# Patient Record
Sex: Male | Born: 1940 | Race: White | Hispanic: No | Smoking: Former smoker
Health system: Southern US, Community
[De-identification: ages and names within clinical notes are randomized; demographics above are authoritative.]

## PROBLEM LIST (undated history)

## (undated) DIAGNOSIS — C801 Malignant (primary) neoplasm, unspecified: Secondary | ICD-10-CM

## (undated) DIAGNOSIS — Z8719 Personal history of other diseases of the digestive system: Secondary | ICD-10-CM

## (undated) DIAGNOSIS — Z87442 Personal history of urinary calculi: Secondary | ICD-10-CM

## (undated) DIAGNOSIS — R519 Headache, unspecified: Secondary | ICD-10-CM

## (undated) DIAGNOSIS — E119 Type 2 diabetes mellitus without complications: Secondary | ICD-10-CM

## (undated) DIAGNOSIS — E785 Hyperlipidemia, unspecified: Secondary | ICD-10-CM

## (undated) DIAGNOSIS — I499 Cardiac arrhythmia, unspecified: Secondary | ICD-10-CM

## (undated) DIAGNOSIS — R51 Headache: Secondary | ICD-10-CM

## (undated) DIAGNOSIS — H269 Unspecified cataract: Secondary | ICD-10-CM

## (undated) DIAGNOSIS — K219 Gastro-esophageal reflux disease without esophagitis: Secondary | ICD-10-CM

## (undated) DIAGNOSIS — I10 Essential (primary) hypertension: Secondary | ICD-10-CM

## (undated) DIAGNOSIS — M1990 Unspecified osteoarthritis, unspecified site: Secondary | ICD-10-CM

## (undated) DIAGNOSIS — R112 Nausea with vomiting, unspecified: Secondary | ICD-10-CM

## (undated) DIAGNOSIS — I4891 Unspecified atrial fibrillation: Secondary | ICD-10-CM

## (undated) DIAGNOSIS — Z9889 Other specified postprocedural states: Secondary | ICD-10-CM

## (undated) HISTORY — DX: Type 2 diabetes mellitus without complications: E11.9

## (undated) HISTORY — PX: SHOULDER SURGERY: SHX246

## (undated) HISTORY — PX: FOOT SURGERY: SHX648

## (undated) HISTORY — PX: CHOLECYSTECTOMY: SHX55

## (undated) HISTORY — PX: OTHER SURGICAL HISTORY: SHX169

## (undated) HISTORY — PX: COLONOSCOPY W/ POLYPECTOMY: SHX1380

## (undated) HISTORY — PX: WISDOM TOOTH EXTRACTION: SHX21

## (undated) HISTORY — PX: HERNIA REPAIR: SHX51

## (undated) HISTORY — PX: CARDIAC CATHETERIZATION: SHX172

## (undated) HISTORY — DX: Unspecified cataract: H26.9

## (undated) HISTORY — PX: BACK SURGERY: SHX140

## (undated) HISTORY — PX: EYE SURGERY: SHX253

---

## 2000-02-21 ENCOUNTER — Ambulatory Visit (HOSPITAL_COMMUNITY): Admission: RE | Admit: 2000-02-21 | Discharge: 2000-02-21 | Payer: Self-pay | Admitting: General Surgery

## 2004-01-21 ENCOUNTER — Inpatient Hospital Stay (HOSPITAL_COMMUNITY): Admission: EM | Admit: 2004-01-21 | Discharge: 2004-01-22 | Payer: Self-pay | Admitting: Emergency Medicine

## 2004-01-26 ENCOUNTER — Inpatient Hospital Stay (HOSPITAL_COMMUNITY): Admission: EM | Admit: 2004-01-26 | Discharge: 2004-01-29 | Payer: Self-pay | Admitting: *Deleted

## 2004-02-26 ENCOUNTER — Encounter: Admission: RE | Admit: 2004-02-26 | Discharge: 2004-02-26 | Payer: Self-pay | Admitting: Gastroenterology

## 2004-02-28 ENCOUNTER — Ambulatory Visit (HOSPITAL_COMMUNITY): Admission: RE | Admit: 2004-02-28 | Discharge: 2004-02-28 | Payer: Self-pay | Admitting: Gastroenterology

## 2004-04-22 ENCOUNTER — Ambulatory Visit (HOSPITAL_COMMUNITY): Admission: RE | Admit: 2004-04-22 | Discharge: 2004-04-23 | Payer: Self-pay | Admitting: Interventional Cardiology

## 2006-05-11 ENCOUNTER — Observation Stay (HOSPITAL_COMMUNITY): Admission: EM | Admit: 2006-05-11 | Discharge: 2006-05-13 | Payer: Self-pay | Admitting: Emergency Medicine

## 2006-07-27 ENCOUNTER — Encounter: Admission: RE | Admit: 2006-07-27 | Discharge: 2006-07-27 | Payer: Self-pay | Admitting: Internal Medicine

## 2007-05-02 ENCOUNTER — Inpatient Hospital Stay (HOSPITAL_COMMUNITY): Admission: EM | Admit: 2007-05-02 | Discharge: 2007-05-04 | Payer: Self-pay | Admitting: Emergency Medicine

## 2007-05-02 ENCOUNTER — Ambulatory Visit: Payer: Self-pay | Admitting: *Deleted

## 2007-05-31 ENCOUNTER — Observation Stay (HOSPITAL_COMMUNITY): Admission: EM | Admit: 2007-05-31 | Discharge: 2007-06-02 | Payer: Self-pay | Admitting: Emergency Medicine

## 2010-01-05 ENCOUNTER — Emergency Department (HOSPITAL_COMMUNITY): Admission: EM | Admit: 2010-01-05 | Discharge: 2010-01-05 | Payer: Self-pay | Admitting: Emergency Medicine

## 2010-04-23 LAB — CBC
HCT: 47.6 % (ref 39.0–52.0)
Hemoglobin: 16.9 g/dL (ref 13.0–17.0)
MCH: 31.2 pg (ref 26.0–34.0)
MCHC: 35.5 g/dL (ref 30.0–36.0)
MCV: 88 fL (ref 78.0–100.0)
Platelets: 211 10*3/uL (ref 150–400)
RBC: 5.41 MIL/uL (ref 4.22–5.81)
RDW: 12.7 % (ref 11.5–15.5)
WBC: 6.3 10*3/uL (ref 4.0–10.5)

## 2010-04-23 LAB — TROPONIN I: Troponin I: 0.01 ng/mL (ref 0.00–0.06)

## 2010-04-23 LAB — DIFFERENTIAL
Basophils Absolute: 0 10*3/uL (ref 0.0–0.1)
Basophils Relative: 0 % (ref 0–1)
Eosinophils Absolute: 0.1 10*3/uL (ref 0.0–0.7)
Eosinophils Relative: 1 % (ref 0–5)
Lymphocytes Relative: 28 % (ref 12–46)
Lymphs Abs: 1.8 10*3/uL (ref 0.7–4.0)
Monocytes Absolute: 0.6 10*3/uL (ref 0.1–1.0)
Monocytes Relative: 10 % (ref 3–12)
Neutro Abs: 3.8 10*3/uL (ref 1.7–7.7)
Neutrophils Relative %: 61 % (ref 43–77)

## 2010-04-23 LAB — COMPREHENSIVE METABOLIC PANEL
ALT: 50 U/L (ref 0–53)
AST: 41 U/L — ABNORMAL HIGH (ref 0–37)
Albumin: 4.1 g/dL (ref 3.5–5.2)
Alkaline Phosphatase: 73 U/L (ref 39–117)
BUN: 15 mg/dL (ref 6–23)
CO2: 23 mEq/L (ref 19–32)
Calcium: 9.1 mg/dL (ref 8.4–10.5)
Chloride: 106 mEq/L (ref 96–112)
Creatinine, Ser: 0.92 mg/dL (ref 0.4–1.5)
GFR calc Af Amer: >60 mL/min (ref >60)
GFR calc non Af Amer: >60 mL/min (ref >60)
Glucose, Bld: 86 mg/dL (ref 70–99)
Potassium: 3.7 mEq/L (ref 3.5–5.1)
Sodium: 136 mEq/L (ref 135–145)
Total Bilirubin: 0.9 mg/dL (ref 0.3–1.2)
Total Protein: 7.3 g/dL (ref 6.0–8.3)

## 2010-04-23 LAB — CK TOTAL AND CKMB (NOT AT ARMC)
CK, MB: 1.7 ng/mL (ref 0.3–4.0)
Relative Index: INVALID (ref 0.0–2.5)
Total CK: 79 U/L (ref 7–232)

## 2010-06-25 NOTE — H&P (Signed)
NAMEJMARI, PELC              ACCOUNT NO.:  [id number]   MEDICAL RECORD NO.:  [id number]          PATIENT TYPE:  INP   LOCATION:  4709                         FACILITY:  [facility]   PHYSICIAN:  Lyn Records, M.D.   DATE OF BIRTH:  [birth year]   DATE OF ADMISSION:  05/02/2007  DATE OF DISCHARGE:                              HISTORY & PHYSICAL   CHIEF COMPLAINT:  Chest pain.   HISTORY OF PRESENT ILLNESS:  Mr. Beirne is a 70 year old, white male  with past medical history notable for medically managed CAD who presents  to the  emergency department for evaluation of chest pain.  The patient  states the symptoms began this morning while at church at approximately  9 a.m..  He had the onset of 10/10 chest pain which was substernal in  location and radiated to his throat and teeth.  He states that his  symptoms were very similar to his prior episodes of angina.  He also  notes that he had been having similar symptoms throughout the preceding  week, although, they were much milder.  Initial intense episode of chest  pain lasted approximately 2-3 minutes and then spontaneously improved.  He took a nitroglycerin approximately 1 p.m. with some relief, however,  the pain never completely went away.  He does endorse associated  shortness of breath, nausea and presyncopal symptoms.  He denies  orthopnea, diaphoresis, palpitations.  The patient has had a long  history of chronic angina, although he denies any exertional symptoms  and it seems to be somewhat random in onset.  Of note, the patient has  had two cardiac catheterizations which have revealed an obstructive  lesion in his diagonal, which is not amenable to percutaneous  intervention.  His stress test was reported in April 2008, which  revealed no evidence of inducible ischemia and probable diaphragmatic  attenuation.  In the ED, his EKG revealed normal sinus rhythm with no  evidence of injury or ischemia.  His cardiac biomarkers by  point of care  testing were negative x1.  Otherwise, he is without complaints.   PAST MEDICAL HISTORY:  1. CAD, medically managed.  Nuclear stress test in April 2008,      negative for inducible ischemia.  2. GERD.  3. Hyperlipidemia.   ALLERGIES:  NO KNOWN DRUG ALLERGIES.   MEDICATIONS:  1. Aspirin 81 mg daily.  2. Zocor 40 mg daily.   SOCIAL HISTORY:  The patient lives [place] with his wife.  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  He quit  smoking approximately 30 years ago.  He has an occasional beer, but  denies any illicit substances.   FAMILY HISTORY:  His mother died of bladder CA at the age of 72.  His  father died of natural causes at the age of 50.  He has a brother who is  status post CABG at the age of 29.  He has another brother who is status  post PCI.   REVIEW OF SYSTEMS:  As per HPI, otherwise complete review of systems was  obtained and is negative.   PHYSICAL EXAMINATION:  VITAL SIGNS:  Blood pressure is 115/73, heart  rate 69, O2 saturations 97% on 2 L nasal cannula.  Temperature is 97.7.  GENERAL:  The patient is alert and oriented x3 in no acute stress.  HEENT:  Normocephalic, atraumatic, EOMI, PERL, nares patent.  OP is  clear without erythema or exudate.  NECK:  Supple, full range of motion, no appreciable JVD.  Carotid  upstrokes are equal and symmetric bilaterally with no audible bruits.  There is no palpable thyromegaly or lymphadenopathy.  CARDIOVASCULAR:  Normal S1 and S2 with no audible murmurs, rubs or  gallops.  PMI is normal in the left midclavicular line.  Peripheral  pulses are 2+ and symmetric bilaterally.  LUNGS:  Clear to auscultation bilaterally.  SKIN:  No rashes or lesions.  ABDOMEN:  Soft, nontender, nondistended.  Positive bowel sounds.  No  hepatosplenomegaly.  GENITALIA:  Normal male genitalia.  EXTREMITIES:  No clubbing, cyanosis or edema.  MUSCULOSKELETAL:  No joint deformity or effusions.  There  is mild  palpable tenderness over the sternum.  NEUROLOGIC:  Grossly nonfocal.   LABORATORY DATA AND X-RAY FINDINGS:  EKG with normal sinus rhythm with  no evidence of injury or ischemia.   Sodium 138, potassium 3.9, chloride 108, CO2 is 22, BUN 17, creatinine  0.9, glucose 86.  White count 6.5, hematocrit 45.6, platelet count 189.  CK-MB point of care 1.2.  Troponin I point of care less than 0.05.   IMPRESSION:  1. Chest pain, rule out myocardial infarction.  2. Coronary artery disease.  3. Hyperlipidemia.  4. Gastroesophageal reflux disease.   PLAN:  Plan to admit the patient to a telemetry bed for further  evaluation and monitoring.  Will cycle his cardiac biomarkers to rule  out myocardial infarction.  The patient has a long history of chronic  angina and his symptoms are consistent with prior episodes.  Will  initiate Lovenox 1 mg/kg q.12 h.  Continue aspirin 325 mg once daily.  Initiate metoprolol 25 mg p.o. b.i.d.  Check fasting lipid profile and  continue simvastatin 40 mg daily.  Will make the patient n.p.o. after  midnight in anticipation of further testing in the morning.      Audery Amel, MD   Electronically Signed     ______________________________  Lyn Records, M.D.    Maylon Cos  D:  05/02/2007  T:  05/03/2007  Job:  [id number]

## 2010-06-25 NOTE — Discharge Summary (Signed)
[name], [name]              ACCOUNT NO.:  [id number]   MEDICAL RECORD NO.:  [id number]          PATIENT TYPE:  OBV   LOCATION:  6527                         FACILITY:  [facility]   PHYSICIAN:  Guy Franco, P.A.       DATE OF BIRTH:  [birth year]   DATE OF ADMISSION:  05/31/2007  DATE OF DISCHARGE:  06/02/2007                               DISCHARGE SUMMARY   DISCHARGE DIAGNOSES:  1. Chest pain, negative cardiac enzymes.  2. Known coronary artery disease, nonobstructive.  3. Gastroesophageal reflux disease.  4. Hyperlipidemia.  5. Hypertension.   HOSPITAL COURSE:  Mr. [name] is a 70 year old male patient with  known history of coronary artery disease, nonobstructive.  He was seen  in the office assisted with catheterization with hypertension and chest  pain.  He was started on Norvasc and also started on Nexium for presumed  reflux disease.  Cardiac catheterization within the past 3 weeks showed  a right coronary artery 30%, LAD less than 50%, circumflex less than  50%, small diagonal and subtotal.  The left main appeared initially to  have greater than 50% stenosis with VUS demonstrated patency.   He was admitted on May 31, 2007, with substernal chest pain once  again.  His labs are normal during his hospitalization and cardiac  enzymes were normal.  It was felt that his discomfort was probably  gastric in nature.  Dr. Carman Ching of the [facility] saw the patient and agreed that a b.i.d. PPI would help.  The  patient has had a workup at [facility] in 2006 which included  an EGD, 24-hour pH monitoring, manometry and Bernstein test, which were  all normal.   At this point, he was ready for discharge to home.  Because he does have  a known coronary artery disease, Dr. Katrinka Blazing felt Ranexa could be  beneficial to the patient and we will have him on this until his  followup in the office.  I am giving him samples to utilize until his  office  visit.  If he is better, he will need to have a prescription for  this.   DISCHARGE MEDICATIONS:  1. Ranexa 500 mg 1 p.o. b.i.d.  2. Enteric coated aspirin 325 mg daily.  3. Metoprolol as prior to admission.  4. Zocor 40 mg daily.  5. Norvasc 5 mg daily.  6. Nexium 40 mg p.o. b.i.d.   DIET:  The patient is to remain on low-sodium, heart-healthy diet.   ACTIVITY:  Increase activity slowly.   FOLLOWUP:  Followup with Laurice Record nurse practitioner or with Dr. Katrinka Blazing  on Jun 16, 2007, at 11 a.m.      Guy Franco, P.A.     LB/MEDQ  D:  06/02/2007  T:  06/02/2007  Job:  [id number]   cc:   Lyn Records, M.D.  James L. Malon Kindle., M.D.

## 2010-06-25 NOTE — Cardiovascular Report (Signed)
[name], [name]              ACCOUNT NO.:  [id number]   MEDICAL RECORD NO.:  [id number]          PATIENT TYPE:  INP   LOCATION:  4709                         FACILITY:  [facility]   PHYSICIAN:  Lyn Records, M.D.   DATE OF BIRTH:  1941/01/30   DATE OF PROCEDURE:  05/04/2007  DATE OF DISCHARGE:                            CARDIAC CATHETERIZATION   INDICATION FOR STUDY:  Recurring chest pain and history of CAD.   INDICATIONS:  Recurring chest pain, history of coronary artery disease,  and total occlusion of small first diagonal, negative markers.   PROCEDURE PERFORMED:  1. Left heart catheterization.  2. Selective coronary angiography.  3. Left ventriculography.  4. Intravascular ultrasound of the left main.  5. Angio-Seal.   DESCRIPTION:  After informed consent, a 6-French sheath was placed in  the right femoral artery using the modified Seldinger technique.  A 6-  [place] A2 multipurpose catheter was then used for hemodynamic  recordings, left ventriculography by hand injection, and selective  coronary angiography.  Again noted was an unusual orientation and  probable bend in the left main that in some views seems to obstruct the  vessel by greater than 50%, other views suggested this area was widely  patent.  Therefore, giving a very eccentric nature to the region of  interest.  Because of this, patient was given 50 units per kg of  intravenous heparin.  ACT was documented to be greater than 250.  An  intravascular ultrasound was performed using a #4 left Judkins guide  catheter, a BMW guidewire, and the Atlantis intravascular ultrasound  system.  Please see results below.   Post intravascular ultrasound:  A groin shot was performed through the  sheath and Angio-Seal was performed with good hemostasis.   RESULTS:  1. Hemodynamic data:      a.     Aortic pressure 117/9.  B  Left ventricular pressure 119/11 mmHg.  1. Left ventriculography:  Left ventricular cavity size and  function      are normal.  The ejection fraction is 60%.  No regional wall motion      abnormality.  2. Coronary angiography:  A.Left main coronary:  The left main coronary artery has a  bend/angulation.  This appears somewhat similar, butin one view more  severe than on previous catheterization 3 years ago.  In that particular  view, there appears to be greater than 50% narrowing.  The vessel is  otherwise widely patent.  B.Left anterior descending coronary:  Faint calcifications noted in the  proximal and mid vessel.  The first diagonal was small, was subtotally  occluded/99%.  The LAD near the origin of the second diagonal contains  eccentric 50% narrowing.  TIMI grade II to III flow was noted in the  LAD, in general slightly less than brisk TIMI III flow.  The second  diagonal was large and patent.  C.  Circumflex artery:  There was also a bend in the proximal  circumflex.  Some views suggest equal to 50% narrowing.  During diastole  when the vessel straightens, no significant obstruction is noted.  The  first obtuse marginal was dominant, small lower second obtuse marginal  was noted.  Circumflex was widely patent.  D.  Right coronary:  The right coronary artery is large, contains mid  vessel irregularities with up to 30% narrowing.  PDA and left  ventricular branches are widely patent.  1. Intravascular ultrasound:  The left main mid segment of interest      where there is either a bend/kink or disease underwent      intravascular ultrasound that demonstrated a cross-sectional area      by planimetry of 13.44 sq mm.  The diameter was 3.8 x 4.4 mm.  The      distal reference segment luminal cross-sectional area was 19.5 sq      mm with diameter measurements of 4.88 x 4.6.  Minimal eccentric      plaque was noted in the left main.  Essentially left main is widely      patent.   IMPRESSION:  1. Moderate coronary artery disease with 99% stenosis of small first      diagonal, less  than or equal to 50% mid left anterior descending      stenosis, widely patent left anterior descending , and mild      atherosclerosis in the mid right coronary less than 50%.  2. The left main has a bend/kink but no significant obstruction is      noted.  (The threshold for significant cross-sectional area in the      left main is less than 6 sq mm).  3. Normal left ventricular function.   PLAN:  Ambulate, home later today.  Continue aggressive risk factor  modification.  Reconsider GI consultation/workup.      Lyn Records, M.D.  Electronically Signed     HWS/MEDQ  D:  05/04/2007  T:  05/04/2007  Job:  [id number]   cc:   Theressa Millard, M.D.

## 2010-06-25 NOTE — Discharge Summary (Signed)
NAMEHAFIZ, IRION              ACCOUNT NO.:  [id number]   MEDICAL RECORD NO.:  [id number]          PATIENT TYPE:  INP   LOCATION:  4709                         FACILITY:  [facility]   PHYSICIAN:  Lyn Records, M.D.   DATE OF BIRTH:  03-Dec-1940   DATE OF ADMISSION:  05/02/2007  DATE OF DISCHARGE:  05/04/2007                               DISCHARGE SUMMARY   DISCHARGE DIAGNOSIS:  1. Nonobstructive coronary artery disease with normal left ventricular      function.  2. Gastroesophageal reflux disease.  3. Hyperlipidemia  4. Long-term medication use.   HISTORY OF PRESENT ILLNESS:  Mr. Want is a 70 year old male patient  who was admitted to [facility] on May 02, 2007, with chest  pain.   Lab studies during this hospital stay include cardiac isoenzymes that  were negative.  Total cholesterol of 121, triglycerides 166, HDL 34, and  LDL 56.  Other laboratory studies were unremarkable with his hemoglobin  being 14.2, hematocrit 41.0, and platelets 186.  Sodium 140, potassium  3.8, BUN 17, and creatinine 0.89.  He ultimately underwent cardiac  catheterization, which showed moderate coronary artery disease with a  99% stenosis of the small first diagonal with less than or equal to 50%  mid LAD stenosis.  The circumflex had been in the proximal area with  some view suggesting a 50% narrowing, but during diastole when the  vessel strained, there was no significant obstruction noted.  Right  coronary artery had mild irregularities nonobstructive.  The PDA and  left ventricular branches were widely patent.  Dr. Katrinka Blazing did note that  there appeared to be a greater than 50% narrowing of the left main, and  at that point, intravascular ultrasound was utilized, and there was  minimal eccentric plaque noted within the left main.  He did feel that  essentially the left main was widely patent.   The patient was then discharged to home in stable condition.  His  condition as stated has  improved.  We are discharging him back home on  his home medication regimen which includes:  1. Enteric-coated aspirin 325 mg.  2. Zocor 40 mg a day.   He is to remain on a low-sodium heart-healthy diet.  Increase activity  slowly.  No lifting over 10 pounds for 1 week and no driving for 1 day.  Follow up with Dr. Katrinka Blazing on May 18, 2007, at 1:30 p.m.      Guy Franco, P.A.      Lyn Records, M.D.  Electronically Signed   LB/MEDQ  D:  06/01/2007  T:  06/02/2007  Job:  [id number]

## 2010-06-28 NOTE — Cardiovascular Report (Signed)
NAMEANASTASIOS, [name]              ACCOUNT NO.:  [id number]   MEDICAL RECORD NO.:  [id number]          PATIENT TYPE:  INP   LOCATION:  2905                         FACILITY:  [facility]   PHYSICIAN:  Lyn Records III, M.D.DATE OF BIRTH:  [birth year]   DATE OF PROCEDURE:  01/29/2004  DATE OF DISCHARGE:  01/29/2004                              CARDIAC CATHETERIZATION   INDICATIONS:  Recurring chest pain, borderline abnormal Cardiolite with  subtle possible anterior ischemia in this patient with few risk factors for  coronary disease.  The study is being done to rule out CAD.   PROCEDURES PERFORMED:  1.  Left heart catheterization.  2.  Selective coronary angiography.  3.  Left ventriculography.  4.  AngioSeal arteriotomy closure.  5.  Intracoronary nitroglycerin administration.   DESCRIPTION:  After informed consent a 6-French sheath was placed in the  right femoral artery using the modified Seldinger technique.  A 6-French A2  multipurpose catheter was used for hemodynamic recordings, left  ventriculography by hand injection, and selective left and right coronary  angiography.  We also used a #4 left Judkins 6-French catheter for left  coronary angiography and intracoronary nitroglycerin administration.  Patient tolerated the procedure without complications.  After sheathogram  was performed in the right femoral AngioSeal arteriotomy closure was  performed without complications.   RESULTS:   I. HEMODYNAMIC DATA:  A.  Aortic pressure 113/56.  B.  Left ventricular pressure 113/13.   II. LEFT VENTRICULOGRAPHY:  The overall LV function is normal.  EF is 65%.  Perhaps the anterobasal anterior wall is slightly hypokinetic.  No mitral  regurgitation is noted.   III. CORONARY ANGIOGRAPHY:  A.  The left main artery arises superiorly from  the left sinus and Valsalva.  There therefore is a bend in the proximal left  anterior descending that suggests the possibility of narrowing which I  do  not believe is true.  There is no more than 25% proximal LAD narrowing.  Minimal luminal irregularity is noted in the body of the left main.  B.  Left anterior descending coronary:  The LAD is large.  It contains  multiple areas of plaque throughout the proximal and mid vessel.  Regions of  plaque obstruct the artery by up to 40-50%.  The first diagonal which is  relatively small, less than 2 mm in diameter, contains a 99% stenosis and  there is TIMI grade 2 flow noted.  The second diagonal which is the largest  of the diagonal branches is free of any significant obstruction, though it  does contain luminal irregularities.  The third diagonal is more distal and  is very small.  The mid and distal LAD is free of significant obstruction  though there are areas of luminal irregularity noted.  C.  Circumflex artery:  The circumflex coronary artery is large and free of  any significant obstruction.  Circumflex gives origin to three obtuse  marginal branches.  The first obtuse marginal is the larger of the three and  is free of obstruction.  D.  Right coronary:  The right coronary artery contains multiple  luminal  irregularities throughout the proximal mid and distal vessel.  A large PDA  is noted.  Two left ventricular branches are noted.   CONCLUSION:  1.  Subtotal occlusion with 99% stenosis and TIMI grade 2 flow noted in the      small first diagonal.  This likely accounts for the patient's symptoms      and the very mild abnormality noted on the Cardiolite.  2.  Patient has perhaps 25% narrowing within a bend in the left main but no      significant obstruction is felt to be present.  There are diffuse      luminal irregularities up to 50% noted in proximal and mid left anterior      descending and luminal irregularities of less severity noted in the      right coronary artery.  The circumflex is moderate in size and free of      any significant obstruction.  3.  Overall normal left  ventricular function.   PLAN:  Medical therapy, Plavix, Statin.  Clinical follow-up in several  weeks.       HWS/MEDQ  D:  01/29/2004  T:  01/30/2004  Job:  [id number]   cc:   Theressa Millard, M.D.  [address] [place]  [place] [address]  Fax: [phone]

## 2010-06-28 NOTE — H&P (Signed)
[name], [name] NO.:  [id number]   MEDICAL RECORD NO.:  [id number]          PATIENT TYPE:  INP   LOCATION:  1824                         FACILITY:  [facility]   PHYSICIAN:  Lyn Records, M.D.   DATE OF BIRTH:  08-13-1940   DATE OF ADMISSION:  01/26/2004  DATE OF DISCHARGE:                                HISTORY & PHYSICAL   PRIMARY CARE PHYSICIAN:  Theressa Millard, M.D.   CHIEF COMPLAINT:  Recurrent chest pain.   HISTORY OF PRESENT ILLNESS:  [name] is a 70 year old gentleman who has  just been evaluated between December 11 and January 22, 2004, for episode  of chest pain.  He has known history of chest pain 10 years ago. Diagnostic  angiography done at that time revealed normal coronary arteries.  Last  hospitalized January 21, 2004, with complaints of exertional chest pain.  Subsequent nuclear medicine study was equivocal with an EF of 59%, possible  suspicious anterolateral wall ischemic at the apex with normal wall motion.  The patient was discharged to home on beta blockers and sublingual  nitroglycerin, instructed to call if he had recurrent chest pain.   The patient did call the office on January 25, 2004, with complaints of  recurrent exertional chest discomfort.  Because of this, he had already been  scheduled to undergo diagnostic angiography this Monday, December 19.  He  presented to the ER today via private vehicle after experiencing severe  substernal chest pain associated with shortness of breath, nausea, pale  coloration.  He was at work when this occurred.  Chest pain lasted greater  than 30 minutes and was severe enough to bring the patient to his knees  according to his description.  The patient described the pain as being  20/10.  He took 2 nitroglycerin while at work without much change in the  quality or severity of the pain.  He notified his wife who brought him to  the ER.  He was subsequently treated with nitroglycerin, aspirin,  and O2  here.  Chest pain came down to 4/10, and it is currently about 1 to 2/10 and  nagging in the midsternal area. When the pain was at its most severe, it  radiated into the right shoulder, down the right arm, and felt like his  bones were aching.  He is currently having a burning type of chest  discomfort in the mid epigastric region.  The patient has already been seen  by Dr. Katrinka Blazing.  EKGs have been reviewed and show no acute ischemic changes.  Initial cardiac isoenzymes are at this time within normal limits.  Subsequently the patient has been admitted for acute coronary syndrome,  started on Integrilin.  Beta blocker and statin drugs have been continued,  and plan is to proceed with diagnostic coronary angiography on Monday as  planned, sooner if patient's status decompensates over the weekend.   REVIEW OF SYSTEMS:  Essentially noncontributory at this point.  He has a  history of hiatal hernia with gastroesophageal reflux disease, but currently  this chest discomfort is quite different from problems related  to this.  He  has had no dizziness, no palpitations, although his coming to the ER and  receiving dose of beta blocker with heart rate dropping into the 40s, he  sometimes feels like his heart is fluttering, but this is the first time  this has occurred.  He has had no problems with lower extremity  claudication, no edema, and no rest chest discomfort.  Chest pain always  occurs with exertion, usually walking at a normal pace.   FAMILY MEDICAL HISTORY:  Mother:  Bladder cancer.  Father died of old age.  No history of coronary artery disease.  There is a family history of  diabetes.   SOCIAL HISTORY:  The patient has a remote history of very light tobacco use  more than 30 years ago, rare social alcohol intake.  He is married and  continues to work and drive.   PAST MEDICAL HISTORY:  1.  Chest pain 10 years ago with nonobstructive coronary artery disease per       catheterization, normal coronary arteries.  2.  Hiatal hernia.  3.  GERD.   ALLERGIES:  No known drug allergies.   CURRENT MEDICATIONS:  1.  Nitroglycerin 0.4 mg sublingual p.r.n.  2.  Aspirin 325 mg daily.   PHYSICAL EXAMINATION:  GENERAL:  Pleasant, well-developed male currently  complaining of mild burning substernal epigastric chest pain.  VITAL SIGNS:  Temperature 97, blood pressure 129/78, heart rate 52 and  regular, initial pulse 62 prior to receipt of beta blockers, respirations  17.  HEENT:  Head is normocephalic.  Sclerae not injected.  NECK:  Supple.  No adenopathy.  NEUROLOGIC:  Alert and oriented x 3, moving all extremities x 4.  No focal  neurological deficits.  Gait and sensation not assessed.  CHEST:  Bilateral lung sounds clear to auscultation posteriorly.  Respiratory effort is not labored.  The patient is currently on 2 liters  nasal cannula O2, saturating 98% in the ER.  CARDIAC:  Heart sounds are S1 and S2.  No rubs, murmurs, thrills, or  gallops.  No JVD.  Carotids 2+ bilaterally without bruits.  Sinus rhythm  without notable ectopy on initial exam.  ABDOMEN:  Soft, nontender, nondistended with no hepatosplenomegaly, masses,  or bruits.  Bowel sounds are present in all four quadrants.  EXTREMITIES:  Symmetrical in appearance without edema, cyanosis, or  clubbing.  Peripheral pulses are easily palpable at 2+ radial, femoral, and  pedal.   LABORATORY DATA AND OTHER STUDIES:  Hemoglobin 15.5, hematocrit 44.5,  platelet count 212,000.  PTT 29.  White count 5300.  Sodium 138, potassium  4.0, chloride 108, CO2 23, glucose 102, BUN 14, creatinine 0.9.  AST mildly  elevated at 44, ALT mildly elevated at 68.  Albumin is normal.  Total  bilirubin normal.  Lipase normal at 36.  Point-of-care markers x 2  collections:  Initial myoglobin 75, subsequently 75.2.  MB 2.4, subsequently  2.3.  Troponin I less than 0.05 with second marker less than 0.05.  He has also had a  cardiac panel drawn showing a CK of 91.  This is done after the  initial point-of-care.  MB 2.6 and troponin I 0.03.   Diagnostic again 12-lead EKG done upon presentation with patient complaining  of chest discomfort.  In comparison to the December 11 EKG, essentially  unchanged.  Sinus rhythm with an elevated J point versus an early  repolarization pattern in the inferolateral leads, normal R wave  progression, normal axis.  Sinus bradycardia, ventricular rate 56, QTC 411  msec.   Chest x-ray has been done in the ER and has been read by the radiologist and  shows mild aortic elongations, no evidence of active disease or interval  change.   IMPRESSION:  1.  Chest pain with recent equivocal Cardiolite study.  Acute coronary      syndrome until proven otherwise.  2.  History of gastroesophageal reflux disease, currently not on proton pump      inhibitor according to my initial assessment.   PLAN:  Again, Dr. Katrinka Blazing has seen and evaluated the patient. Will proceed as  follows.  1.  Admit patient to a telemetry bed.  2.  Start acute coronary syndrome protocol with initiation of aspirin and      Integrilin. We will hold Plavix pending catheterization in the event      patient may have multivessel disease that may require surgical      intervention.  3.  Nitroglycerin patch has been started initially.  If chest pain persists,      which has been recurring since admission, we will start nitroglycerin IV      and titrate up as per complaints of chest pain and as blood pressure      tolerates, keeping systolic greater than or equal to 100.  4.  Again, we will plan on catheterization on Monday, January 29, 2004.      Risks and benefits of this procedure have already been discussed with      the patient per Dr. Katrinka Blazing.  5.  We will start beta blocker therapy with Lopressor 25 mg p.o. b.i.d.,      holding for heart rate less than 50 or systolic blood pressure less than      100.  6.  We will  continue Lipitor 10 mg p.o. daily.  Presume fasting lipid panel      was drawn at previous admission.  We need to review this to make sure      patient is on appropriate drug therapy.       ALE/MEDQ  D:  01/26/2004  T:  01/26/2004  Job:  [id number]   cc:   Theressa Millard, M.D.  [address] [place]  [place] [address]  Fax: [phone]

## 2010-06-28 NOTE — Cardiovascular Report (Signed)
[name], [name]              ACCOUNT NO.:  [id number]   MEDICAL RECORD NO.:  [id number]          PATIENT TYPE:  OIB   LOCATION:  2899                         FACILITY:  [facility]   PHYSICIAN:  Lyn Records III, M.D.DATE OF BIRTH:  [birth year]   DATE OF PROCEDURE:  04/22/2004  DATE OF DISCHARGE:                              CARDIAC CATHETERIZATION   PROCEDURES PERFORMED:  1.  Left heart catheterization.  2.  Coronary angiography.  3.  Left ventriculography.  4.  Aortography.  5.  Intravascular ultrasound of the left main.  6.  Attempted PTCA of diagonal.   INDICATION:  The patient has had recurring episodes of chest discomfort in  the substernal region responsive  to nitroglycerin. Episodes have  predominately occurred at rest. He was documented in January to have a small  first diagonal that had a high-grade obstruction. This study is being done  to rule out progression and to IVUS the left main which looked peculiar on  the first study to rule out significant left main stenosis.   DESCRIPTION:  After informed consent a 6-French sheath was placed using the  modified Seldinger technique. A 6-French A2 multipurpose catheter was then  used for hemodynamic recordings, left ventriculography by hand injection,  and left coronary cusp shots of the left main. After demonstrating that  there appeared to be no significant left main obstruction, we performed  right coronary angiography using the multipurpose catheter and also  aortography. The right coronary appeared to arise somewhat anteriorly from  the right sinus of Valsalva. This raised a question of whether or not there  could be a course of this vessel between the pulmonary artery and aorta that  could account for some of the patient's symptoms. The left coronary was  investigated with a #4 left Judkins diagnostic catheter.   After visualizing the coronaries, the bend of the left main was still  somewhat concerning and also the  high-grade obstruction in the first  diagonal was again noted and after consideration and with the patient's  significant symptoms, we decided to proceed with intravascular ultrasound of  the left main and then attempted angioplasty of the small first diagonal on  the outside chance that this could be causing the patient's symptom. The  patient was given heparin 4500 units IV in a double bolus followed by an  infusion of Integrilin was started. We used a #4 left Judkins 6-French guide  catheter. We performed intravascular ultrasound of the left main using the  Scimed system. We then attempted to perform angioplasty on the first  diagonal, but were unable to cross the 100% stenosis with the guide wire.  Three different guidewires were used. The case was then terminated and  AngioSeal arteriotomy closure was performed after appropriate angiographic  documentation.   RESULTS:   I. HEMODYNAMIC DATA:  A.  Aortic pressure 95/60.  B.  Left ventricular pressure 96/5.   II. LEFT VENTRICULOGRAPHY:  The left ventricle is normal in size and  demonstrates normal overall contractility. EF is greater than 60%.   III. CORONARY ANGIOGRAPHY:  A.  Left main coronary:  The left main has a  right angle bends in its mid section. Angiographically this appears to  simply be a bend. Stenosis needs to be excluded. Intravascular ultrasound  was performed. Please see below.  B.  Left anterior descending coronary: The LAD is a large vessel that wraps  around the left ventricular apex. It contains proximal regions of  eccentricity with stenosis up to 30-40%. The first diagonal is 99%  obstructed and actually fills late perhaps by left-to-left collaterals. No  significant obstruction is present in the LAD or its branches other than  this first diagonal. The large second diagonal contains 30-40% narrowing.  C.  Circumflex artery: The circumflex artery is large giving three obtuse  marginals. The first obtuse  marginal is dominant. No significant obstructive  lesion is noted.  D.  Right coronary: The right coronary artery is widely patent but as  mentioned before, there appears to be an anterior course raising the  question of whether this vessel could have an intra PA/aortic route. The  vessel contains irregularities of up to 50% in the mid and distal vessel.  PDA filling is sluggish. This is a dominant right coronary.   IV. AORTOGRAPHY:  The aortic root angiogram is of poor quality. There is  nothing to suggest the presence of annuloaortic ectasia. No obvious  dissection is noted. The course of the right coronary could not be  ascertained.   V. INTRAVASCULAR ULTRASOUND:  Left main and proximal LAD: No significant  obstructive lesions were noted. Even within the bend in the left main the  diameter was 3.5-4 x 4mm in diameter.   VI. PCI OF THE DIAGONAL:  We were unable to open the diagonal due to failure  to cross with the guidewire. Multiple guidewires were used.   CONCLUSION:  1.  Refractory and recurring episodes of ischemic-quality chest discomfort      responsive to nitroglycerin. Etiology uncertain. Possibly related to the      small first diagonal but I doubt this as this vessel appears to have      left-to-left collaterals.  2.  Failed angioplasty attempt at the small first diagonal.  3.  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 Anomalous course      between the PA and aorta needs to be excluded.  4.  Widely patent right coronary otherwise.  5.  Widely patent left coronary system other than as outlined above with      irregularities noted in the left anterior descending, high-grade      obstruction in the first diagonal/total occlusion with left-to-left      collaterals.   PLAN:  Consider CTA of the coronaries to rule out intra PA/aortic course of  the proximal right coronary. Will get other clinicians to look at these angiograms to make a decision about whether or not  perhaps I am missing  something here.      HWS/MEDQ  D:  04/22/2004  T:  04/22/2004  Job:  [id number]   cc:   Theressa Millard, M.D.  [address] [place]  [place] [address]  Fax: [phone]

## 2010-06-28 NOTE — Op Note (Signed)
NAMESUEDE, GREENAWALT              ACCOUNT NO.:  [id number]   MEDICAL RECORD NO.:  [id number]          PATIENT TYPE:  AMB   LOCATION:  ENDO                         [facility]ACILITY:  [facility]   PHYSICIAN:  John C. Madilyn Fireman, M.D.    DATE OF BIRTH:  Jun 09, 1940   DATE OF PROCEDURE:  02/28/2004  DATE OF DISCHARGE:                                 OPERATIVE REPORT   PROCEDURE:  Colonoscopy.   INDICATIONS FOR PROCEDURE:  Family history of colon cancer in a first-degree  relative.   PROCEDURE:  The patient was placed in the left lateral decubitus position  and placed on the pulse monitor with continuous low-flow oxygen delivered by  nasal cannula.  He was sedated with 50 mcg IV fentanyl and 4 mg IV Versed in  addition to the medicine given for the previous EGD.  The Olympus video  colonoscope was inserted into the rectum and advanced to the cecum,  confirmed by transillumination of McBurney's point and visualization of  ileocecal valve and appendiceal orifice.  The prep was excellent.  The  cecum, ascending, and transverse colon all appeared normal with no masses,  polyps, diverticula, or other mucosal abnormalities.  Within the sigmoid  colon, there were seen a few scattered diverticula and no other  abnormalities.  The rectum appeared normal, and retroflexed view of the anus  revealed no obvious internal hemorrhoids.  The scope was then withdrawn, and  the patient returned to the recovery room in stable condition.  He very  tolerated the procedure well, and there were no immediate complications.   IMPRESSION:  Sigmoid diverticulosis, otherwise normal study.   PLAN:  Repeat colonoscopy in 5 years based on his family history of colon  cancer in a first-degree relative.      JCH/MEDQ  D:  02/28/2004  T:  02/28/2004  Job:  [id number]   cc:   Lesleigh Noe, M.D.  [address]  [address]  [place]  [place] [address]  Fax: [phone]

## 2010-06-28 NOTE — Discharge Summary (Signed)
NAMEADAM, [name]              ACCOUNT NO.:  [id number]   MEDICAL RECORD NO.:  [id number]          PATIENT TYPE:  OBV   LOCATION:  6533                         FACILITY:  [facility]   PHYSICIAN:  Theressa Millard, M.D.    DATE OF BIRTH:  12-10-1940   DATE OF ADMISSION:  05/11/2006  DATE OF DISCHARGE:  05/13/2006                               DISCHARGE SUMMARY   ADMITTING DIAGNOSIS:  Chest pain.   DISCHARGE DIAGNOSES:  1. Atypical chest pain.  Stress Cardiolite negative for evidence of      inducible ischemia.  2. History of nonobstructive coronary artery disease on      catheterization in the past.  3. Gastroesophageal reflux disease.  4. Possible chest wall pain.   HISTORY:  The patient is a 70 year old white male who has a history of  atypical chest pain and nonobstructive coronary disease on cath.  He  presented with prolonged chest pain.   HOSPITAL COURSE:  The patient was admitted on the basis of serial  enzymes and EKGs, and myocardial infarction was ruled out.  He never  really had substantial resolution of his chest pain, but it continued  throughout the hospitalization.  Nevertheless, CKs and troponins were  repeatedly negative.  There was some evidence that it improved slightly  with IV nitroglycerine.  He underwent a stress Cardiolite that was  negative for inducible ischemia, and he was discharged in improved  condition.   DISCHARGE MEDICATIONS:  1. Aspirin 325 mg daily.  2. Protonix 40 mg daily.  3. Sublingual nitroglycerine p.r.n.  4. Imdur 30 mg daily, which is a new prescription.  5. Simvastatin 80 mg daily.  6. Diclofenac 75 mg b.i.d.   FOLLOWUP:  He will call to make an appointment to see me in one month.   ACTIVITY:  As tolerated.   DIET:  No added salt.      Theressa Millard, M.D.  Electronically Signed     JO/MEDQ  D:  07/02/2006  T:  07/02/2006  Job:  [id number]

## 2010-06-28 NOTE — Op Note (Signed)
Oscar, Torres              ACCOUNT NO.:  [id number]   MEDICAL RECORD NO.:  [id number]          PATIENT TYPE:  AMB   LOCATION:  ENDO                         FACILITY:  [facility]   PHYSICIAN:  John C. Madilyn Fireman, M.D.    DATE OF BIRTH:  1940/11/05   DATE OF PROCEDURE:  02/28/2004  DATE OF DISCHARGE:                                 OPERATIVE REPORT   PROCEDURE:  Esophagogastroduodenoscopy with biopsy.   INDICATIONS FOR PROCEDURE:  Chest pain felt to be noncardiac in nature   PROCEDURE:  The patient was placed in the left lateral left lateral  decubitus position and placed on the pulse monitor with continuous low-flow  oxygen delivered by nasal cannula.  He was sedated with 50 mcg IV fentanyl  and 6 mg IV Versed.  The Olympus video endoscope was advanced under direct  vision into the oropharynx and esophagus.  The esophagus was straight and of  normal caliber with the squamocolumnar line at 38 cm.  There was no visible  hiatal hernia, ring, or stricture.  However, there was a 2 cm hiatal hernia  with what appeared to be patulous gastroesophageal junction.  The stomach  was entered, and a small amount of liquid secretions were suctioned from the  fundus.  Retroflexed view of the cardia confirmed a hiatal hernia, was  otherwise unremarkable.  There was some erythema and granularity in the  proximal stomach consistent with the diffuse proximal gastritis which faded  to a normal appearance of the distal mucosa of the distal body and antrum.  A CLOtest was obtained.  The pylorus was nondeformed and easily allowed  passage of the endoscope tip into the duodenum.  Both the bulb and second  portion were well-inspected and appeared to be within normal limits.  The  scope was then withdrawn, and the patient returned to the recovery room in  stable condition.  He tolerated the procedure well, and there were no  immediate complications.   IMPRESSION:  1.  Hiatal hernia with patulous  gastroesophageal junction.  2.  Gastritis, mainly involving the proximal stomach.   PLAN:  We will await CLOtest and treat for eradication if Helicobacter.  Otherwise treat with a double dose proton pump inhibitor.      JCH/MEDQ  D:  02/28/2004  T:  02/28/2004  Job:  [id number]   cc:   Lyn Records III, M.D.  [address]  [address]  [place]  [place] [address]  Fax: [phone]

## 2010-06-28 NOTE — Discharge Summary (Signed)
NAMEOSRIC, KLOPF              ACCOUNT NO.:  [id number]   MEDICAL RECORD NO.:  [id number]          PATIENT TYPE:  INP   LOCATION:  2905                         FACILITY:  [facility]   PHYSICIAN:  Lyn Records, M.D.   DATE OF BIRTH:  Dec 29, 1940   DATE OF ADMISSION:  01/26/2004  DATE OF DISCHARGE:  01/29/2004                                 DISCHARGE SUMMARY   ADMISSION DIAGNOSES:  1.  Chest pain.  2.  Gastroesophageal reflux disease.   DISCHARGE DIAGNOSES:  1.  Acute coronary syndrome.  2.  Paroxysmal atrial fibrillation.  3.  Gastroesophageal reflux disease.   PROCEDURE:  Left heart catheterization January 29, 2004.   COMPLICATIONS:  None.   DISCHARGE STATUS:  Stable. Improved.   HISTORY OF PRESENT ILLNESS:  Please see complete H&P for details. In short,  this is a 70 year old male with no CAD. Had a normal cardiac catheterization  in 1996. Had a Cardiolite one week prior to admission which showed some  questionable subtle ischemia. Was admitted on January 26, 2004 with daily  episodes of exertional chest pressure. On the morning of admission  apparently was more severe and occurred while he was at work. He took two  sublingual nitroglycerin. He was admitted to rule out acute coronary  syndrome.   PHYSICAL EXAMINATION ON ADMISSION:  Please see H&P for complete details, but  in short, his vital signs were stable. He was afebrile. Physical exam  essentially without any abnormalities.   EKG shows sinus rhythm and early repolarization pattern in the inferolateral  lead which was unchanged since EKG done December of 2005. Admission labs  were all normal with the exception of mildly elevated LFTS with an AST at  44, ALT at 68. Total bilirubin was normal. Two point of care markers at  admission were normal. Chest x-ray showed no active disease.   HOSPITAL COURSE:  He was admitted to telemetry. He was started on aspirin  and Integrilin. His Plavix was on hold and was pending  cardiac  catheterization. He was started on a nitroglycerin patch. Heart  catheterization was planned. He was started on low dose beta blocker. His  statin therapy was continued. He was also started on Lovenox.   The rest of his admission, he had no recurrent chest discomfort. Vital signs  remained stable. Telemetry showed a questionable brief run of aberrant  atrial fibrillation. No change in treatment was needed because of its  brevity. He apparently did experience some recurrent epigastric discomfort  and nausea. At this point, IV nitroglycerin was initiated. He did experience  some mild headaches that were successfully relieved with Tylenol.   On January 29, 2004, he was taken to the cardiac catheterization lab by Dr.  Katrinka Blazing. Results showed minimal LV systolic dysfunction with some mild to  anterior hypokinesis. EF was estimated at 60%. Left main had an area of less  than 25%. LAD had some proximal 30 to 40%. Diagonal #1 was small and was  almost completely occluded. Circumflex was normal. RCA had an area of 20 to  30% proximally and mid. He tolerated the  procedure well, and there were no  complications. Later that day after up and ambulating and checking groin  catheterization site, he was ready for discharge.   DISCHARGE MEDICATIONS:  Unfortunately, the discharge sheet I filled out for  the patient is not in the medical record. There essentially were no changes  in his medications. He will go home on:  1.  Aspirin 325 mg daily.  2.  Nitroglycerin 0.4 mg p.r.n.  3.  Lipitor 10 mg daily.   FOLLOW UP:  He will see Dr. Katrinka Blazing back for followup in two weeks. He was  advised to call sooner if problems.      HB/MEDQ  D:  03/25/2004  T:  03/25/2004  Job:  [id number]

## 2010-06-28 NOTE — Consult Note (Signed)
[name], [name] NO.:  [id number]   MEDICAL RECORD NO.:  [id number]          PATIENT TYPE:  EMS   LOCATION:  MAJO                         FACILITY:  [facility]   PHYSICIAN:  Lyn Records, M.D.   DATE OF BIRTH:  01-29-1941   DATE OF CONSULTATION:  DATE OF DISCHARGE:                                 CONSULTATION   INAUDIBLE AUDIO AT BLANKS ...   Cardiac Consultation.   1. __________  2. Coronary atherosclerotic heart disease previously underwent cardiac      cath in 06/2004 and also coronary _________ in 2006.      a.     Totally occluded first diagonal _________ right coronary       _________.  3. Hypertension.  4. Hyperlipidemia.   _____________  Lovenox.  1. Serial markers.  2. If markers are positive, 2b/3a inhibitive therapy.  3. If positive markers, would recommend repeat cardiac      catheterization.  If markers are negative, would recommend      myocardial perfusion imaging.   COMMENTS:  [name] is a 70 year old gentleman well known to me with  a history of hyperlipidemia, hypertension, and coronary atherosclerosis  who has had intermittent recurrent chest discomfort over the years.  He  has had previous cardiac catheterization and has been demonstrated to  have atherosclerotic disease as outlined above.  In reviewing the  cineangiograms he has slow filling in the TIMI 2 range in different  vascular territories at various times suggesting the possibility of  endothelial dysfunction.  This morning he was awakened by a prolonged  sublingual discomfort that was partially relieved by nitroglycerin at  Dr. Newell Coral office.  Now the discomfort is increasing again.  There  was some dyspnea associated with the discomfort and there was radiation  into the left arm.   The patient's Current Medical Regimen, Past Medical History, Habits,  Review of Systems, Allergies are all outlined in Dr. Jone Baseman note.   PHYSICAL EXAMINATION:  VITAL SIGNS: Blood  pressure 147/92, heart rate is  68.  HEENT: Unremarkable.  NECK: No JVD, carotid bruits, or thyromegaly.  LUNGS: Clear to auscultation and percussion.  CARDIAC: No gallop, no rub, no click, no murmur.  ABDOMEN: Soft.  EXTREMITIES: No edema.   EKG reveals no acute ST-T-wave change.  Laboratory data is pending.      Lyn Records, M.D.  Electronically Signed     HWS/MEDQ  D:  05/11/2006  T:  05/11/2006  Job:  [id number]

## 2010-06-28 NOTE — H&P (Signed)
NAMEBRANDOL, CORP              ACCOUNT NO.:  [id number]   MEDICAL RECORD NO.:  [id number]          PATIENT TYPE:  INP   LOCATION:  1825                         FACILITY:  [facility]   PHYSICIAN:  Lyn Records III, M.D.DATE OF BIRTH:  07-Sep-1940   DATE OF ADMISSION:  01/21/2004  DATE OF DISCHARGE:                                HISTORY & PHYSICAL   REASON FOR ADMISSION:  Chest pain.   SUBJECTIVE:  The patient is a very pleasant 70 year old gentleman whom I saw  10 years ago for substernal chest discomfort and ultimately had cardiac  catheterization that revealed normal coronary arteries and a diagnosis of  hiatal hernia was made.  The patient has been asymptomatic with reference to  any chest pain until the 24 hours prior to admission when around noontime on  January 20, 2004 while walking to his garage he suddenly developed severe  substernal chest pressure that radiated into both sides of his neck and up  into his teeth.  The initial episode lasted 15 to 20 minutes and nearly  completely resolved, but never completely.  A very mild sensation of chest  tightness lingered until later in the evening and at around 6:30 p.m.  another severe episode occurred and was made worse when he lay flat.  He was  unable to find a comfortable position and this episode lasted perhaps 30  minutes and then again nearly completely resolved.  A third episode occurred  around 1 a.m. and we spoke on the phone.  I advised him to come to the  emergency room where upon arrival in the emergency room, his EKG was normal  and sublingual nitroglycerine completely resolved the discomfort for the  first time in approximately 12 hours.  He is currently pain-free.   ALLERGIES:  None.   MEDICATIONS:  None.   HABITS:  The patient does not smoke cigarettes on any kind of chronic basis  for many years, greater than 30 years ago, and only smoked for a year or 2  even then.  The patient drinks alcohol very  rarely.   FAMILY HISTORY:  Positive for bladder cancer in his mother.  Father died of  old age.  There is no family history of coronary artery disease.  There is a  family history of diabetes.  The patient has no personal family history.   PAST MEDICAL HISTORY:  Hiatal hernia with gastroesophageal reflux.   REVIEW OF SYSTEMS:  The patient has no exertional limitations.  He has had  no significant surgery.  No lightheadedness, dizziness, or other medical  complaints.  Extremities revealed no edema, no claudication.  He has not had  syncope.   OBJECTIVE:  VITAL SIGNS: Blood pressure 130/90, heart rate 80, respiratory  rate 16.  HEENT exam unremarkable.  Pupils equal, round, reactive to light.  NECK exam: No JVD, carotid bruits or thyromegaly.  No adenopathy.  LUNGS: Clear to auscultation and percussion.  CARDIAC EXAM: No click, no gallop, no rub, no murmur.  ABDOMEN: Soft, bowel sounds normal.  No masses.  EXTREMITIES:  Revealed no edema.  Pulses are 2+  and symmetric in upper and  lower extremities.  Dorsalis pedis and posterior tibial pulses 2+, radial  pulses 2+.  NEUROLOGICAL EXAM:  Reveals no motor or sensory deficits.  The patient is  alert and oriented x3.   DIAGNOSTIC STUDIES:  EKG is normal.  Chest x-ray is unremarkable.   Three sets of point of care markers negative.   ASSESSMENT:  1.  Chest discomfort with quality suggestive of ischemic heart disease,      possible unstable angina.  2. History of hiatal hernia with reflux.   PLAN:  1.  Rule out myocardial infarction with enzymes and EKG.  2. Stress      Cardiolite if all markers are negative.  3. Start proton pump inhibitor.      4. Check liver profile.       HWS/MEDQ  D:  01/21/2004  T:  01/21/2004  Job:  [id number]   cc:   Theressa Millard, M.D.  [address] [place]  [place] [address]  Fax: [phone]

## 2010-06-28 NOTE — Op Note (Signed)
[facility]  Patient:    [name], [name]                     MRN: [id number] Proc. Date: 02/21/00 Adm. Date:  [id number] Attending:  Brandy Hale CC:         Winn Jock. Earl Gala, M.D.   Operative Report  PREOPERATIVE DIAGNOSIS:  Right inguinal hernia.  POSTOPERATIVE DIAGNOSIS:  Right inguinal hernia.  OPERATION PERFORMED:  Repair of right inguinal hernia with mesh.  SURGEON:  Angelia Mould. Derrell Lolling, M.D.  OPERATIVE INDICATIONS:  This is a 70 year old white man with a symptomatic right inguinal hernia.  He has a four month history of pain in his right groin and a small bulge.  The pain has been bothering him everyday.  On examination, he has a small to medium size right inguinal hernia which is completely reducible.  There is no testicular mass.  He is brought to the operating room electively for repair of his right inguinal hernia.  DESCRIPTION OF PROCEDURE:  The patient was brought to the operating room and placed supine on the operating table.  He was monitored and sedated by the anesthesia department.  The right groin was prepped and draped in a sterile fashion.  One percent Xylocaine with epinephrine was used as a local infiltration anesthetic.  A transverse incision was made in the right groin. Dissection was carried down through the subcutaneous tissue, exposing the aponeurosis of the external oblique.  The external oblique was incised in the direction of its fibers, opening up the external inguinal ring.  The external oblique was reflected.  The ilioinguinal nerve was identified and preserved. Cord structures were mobilized and encircled with a Penrose drain.  We dissected out a moderately large direct right inguinal hernia.  This was simply reduced and the overlying tissues oversewn to reduce the hernia bulge. I then dissected the cord structures by skeletonizing the cremasteric muscle fibers.  I did not find any evidence of an indirect  hernia.  The internal ring was closed laterally with a single suture of 0 Vicryl.  The floor of the inguinal canal was repaired and reinforced with an onlay graft and polypropylene mesh.  The mesh was cut into an appropriate shape and sutured in place with running sutures of 2-0 Prolene.  The mesh was sutured so as to generously overlap the fascia at the pubic tubercle, along the inguinal ligament inferiorly, and then along the transversus aponeurosis superiorly. The mesh incised laterally so as to wrap around the cord structures of the inguinal ring.  The tails of the mesh were overlapped laterally and the suture lines completed.  This provided a very secure repair and coverage both medial and lateral to the internal inguinal ring, but allowed an adequate opening for the cord structures.  The wound was irrigated with saline.  The cord structures and ilioinguinal nerve were placed superficial to the mesh.  The external oblique was closed with a running suture of 2-0 Vicryl, placing the cord structures in the ilioinguinal nerve deep to the external oblique.  The Scarpas fascia was closed with 3-0 Vicryl sutures and the skin closed with a running subcuticular suture of 4-0 Vicryl and Steri-Strips.  Clean bandages were placed and the patient taken to the recovery room in stable condition.  Estimated blood loss was about 10 cc.  Complications none. Sponge, needle, and instrument counts were correct. DD:  02/21/00 TD:  02/22/00 Job: [id number]

## 2010-07-22 ENCOUNTER — Emergency Department (HOSPITAL_COMMUNITY)
Admission: EM | Admit: 2010-07-22 | Discharge: 2010-07-22 | Disposition: A | Discharge status: Home / Self Care | Payer: Medicare Other | Attending: Emergency Medicine | Admitting: Emergency Medicine

## 2010-07-22 DIAGNOSIS — R110 Nausea: Secondary | ICD-10-CM | POA: Insufficient documentation

## 2010-07-22 DIAGNOSIS — E785 Hyperlipidemia, unspecified: Secondary | ICD-10-CM | POA: Insufficient documentation

## 2010-07-22 DIAGNOSIS — G500 Trigeminal neuralgia: Secondary | ICD-10-CM | POA: Insufficient documentation

## 2010-07-22 DIAGNOSIS — Z7982 Long term (current) use of aspirin: Secondary | ICD-10-CM | POA: Insufficient documentation

## 2010-07-22 DIAGNOSIS — R51 Headache: Secondary | ICD-10-CM | POA: Insufficient documentation

## 2010-07-22 DIAGNOSIS — K219 Gastro-esophageal reflux disease without esophagitis: Secondary | ICD-10-CM | POA: Insufficient documentation

## 2010-07-22 DIAGNOSIS — Z79899 Other long term (current) drug therapy: Secondary | ICD-10-CM | POA: Insufficient documentation

## 2010-07-22 DIAGNOSIS — I2510 Atherosclerotic heart disease of native coronary artery without angina pectoris: Secondary | ICD-10-CM | POA: Insufficient documentation

## 2010-07-22 DIAGNOSIS — R259 Unspecified abnormal involuntary movements: Secondary | ICD-10-CM | POA: Insufficient documentation

## 2010-07-22 LAB — SEDIMENTATION RATE: Sed Rate: 3 mm/hr (ref 0–16)

## 2010-11-04 LAB — DIFFERENTIAL
Basophils Absolute: 0
Basophils Relative: 0
Eosinophils Absolute: 0.1
Eosinophils Relative: 2
Lymphocytes Relative: 31
Lymphs Abs: 2
Monocytes Absolute: 0.6
Monocytes Relative: 9
Neutro Abs: 3.8
Neutrophils Relative %: 58

## 2010-11-04 LAB — POCT I-STAT, CHEM 8
BUN: 17
Calcium, Ion: 1.08 — ABNORMAL LOW
Chloride: 108
Creatinine, Ser: 0.9
Glucose, Bld: 86
HCT: 46
Hemoglobin: 15.6
Potassium: 3.9
Sodium: 138
TCO2: 22

## 2010-11-04 LAB — LIPID PANEL
Cholesterol: 121
HDL: 34 — ABNORMAL LOW
LDL Cholesterol: 56
Total CHOL/HDL Ratio: 3.6
Triglycerides: 156 — ABNORMAL HIGH
VLDL: 31

## 2010-11-04 LAB — CBC
HCT: 41
HCT: 45.6
Hemoglobin: 14.2
Hemoglobin: 15.5
MCHC: 34
MCHC: 34.5
MCV: 88.7
MCV: 88.8
Platelets: 186
Platelets: 189
RBC: 4.62
RBC: 5.14
RDW: 13.3
RDW: 13.4
WBC: 6.4
WBC: 6.5

## 2010-11-04 LAB — COMPREHENSIVE METABOLIC PANEL
ALT: 21
AST: 20
Albumin: 3.4 — ABNORMAL LOW
Alkaline Phosphatase: 56
BUN: 17
CO2: 26
Calcium: 9
Chloride: 105
Creatinine, Ser: 0.89
GFR calc Af Amer: >60
GFR calc non Af Amer: >60
Glucose, Bld: 77
Potassium: 3.8
Sodium: 140
Total Bilirubin: 0.9
Total Protein: 6.1

## 2010-11-04 LAB — POCT CARDIAC MARKERS
CKMB, poc: 1.2
Myoglobin, poc: 35.6
Operator id: 277751
Troponin i, poc: <0.05

## 2010-11-04 LAB — CARDIAC PANEL(CRET KIN+CKTOT+MB+TROPI)
CK, MB: 1.4
CK, MB: 1.5
Relative Index: INVALID
Relative Index: INVALID
Total CK: 68
Total CK: 69
Troponin I: 0.03
Troponin I: 0.04

## 2010-11-04 LAB — APTT: aPTT: 37

## 2010-11-04 LAB — PROTIME-INR
INR: 0.9
Prothrombin Time: 12.4

## 2010-11-05 LAB — CBC
HCT: 41.2
HCT: 43.8
Hemoglobin: 14.5
Hemoglobin: 15.1
MCHC: 34.3
MCHC: 35.1
MCV: 88.7
MCV: 88.8
Platelets: 172
Platelets: 198
RBC: 4.64
RBC: 4.95
RDW: 13.1
RDW: 13.2
WBC: 5.9
WBC: 6.7

## 2010-11-05 LAB — PROTIME-INR
INR: 0.9
Prothrombin Time: 11.9

## 2010-11-05 LAB — COMPREHENSIVE METABOLIC PANEL
ALT: 20
AST: 23
Albumin: 4
Alkaline Phosphatase: 67
BUN: 15
CO2: 25
Calcium: 9.2
Chloride: 104
Creatinine, Ser: 0.85
GFR calc Af Amer: >60
GFR calc non Af Amer: >60
Glucose, Bld: 73
Potassium: 3.6
Sodium: 140
Total Bilirubin: 0.7
Total Protein: 7

## 2010-11-05 LAB — CK TOTAL AND CKMB (NOT AT ARMC)
CK, MB: 2.2
Relative Index: INVALID
Total CK: 94

## 2010-11-05 LAB — POCT I-STAT, CHEM 8
BUN: 18
Calcium, Ion: 1.15
Chloride: 105
Creatinine, Ser: 1
Glucose, Bld: 95
HCT: 45
Hemoglobin: 15.3
Potassium: 4
Sodium: 139
TCO2: 26

## 2010-11-05 LAB — VMA + CREATININE, URINE (TIMED COLLECTION)
VMA, 24H Ur Adult: 2.5 mg/24hr (ref 1.8–6.7)
Volume, Urine-VMAUR: 1050

## 2010-11-05 LAB — DIFFERENTIAL
Basophils Absolute: 0
Basophils Relative: 0
Eosinophils Absolute: 0.1
Eosinophils Relative: 1
Lymphocytes Relative: 19
Lymphs Abs: 1.3
Monocytes Absolute: 0.6
Monocytes Relative: 9
Neutro Abs: 4.7
Neutrophils Relative %: 71

## 2010-11-05 LAB — POCT CARDIAC MARKERS
CKMB, poc: 1.7
CKMB, poc: 1.8
Myoglobin, poc: 41.1
Myoglobin, poc: 45
Operator id: 146091
Operator id: 151321
Troponin i, poc: <0.05
Troponin i, poc: <0.05

## 2010-11-05 LAB — CARDIAC PANEL(CRET KIN+CKTOT+MB+TROPI)
CK, MB: 1.4
CK, MB: 1.5
Relative Index: INVALID
Relative Index: INVALID
Total CK: 68
Total CK: 68
Troponin I: 0.05
Troponin I: 0.05

## 2010-11-05 LAB — METANEPHRINES, URINE, 24 HOUR
Metaneph Total, Ur: 229 mcg/24 h (ref 224–832)
Metanephrines, Ur: 74 mcg/24 h — ABNORMAL LOW (ref 90–315)
Normetanephrine, 24H Ur: 155 mcg/24 h (ref 122–676)
Volume, Urine-METAN: 1050

## 2010-11-05 LAB — 5 HIAA, QUANTITATIVE, URINE, 24 HOUR
5-HIAA, 24 Hr Urine: 0.9 mg/24 h (ref <=6.0)
Volume, Urine-5HIAA: 1050 mL/24 h

## 2010-11-05 LAB — D-DIMER, QUANTITATIVE: D-Dimer, Quant: 0.31

## 2010-11-05 LAB — TROPONIN I: Troponin I: 0.05

## 2012-03-05 ENCOUNTER — Other Ambulatory Visit: Payer: Self-pay | Admitting: Neurological Surgery

## 2012-03-11 ENCOUNTER — Encounter (HOSPITAL_COMMUNITY): Payer: Self-pay

## 2012-03-11 ENCOUNTER — Encounter (HOSPITAL_COMMUNITY)
Admission: RE | Admit: 2012-03-11 | Discharge: 2012-03-11 | Disposition: A | Discharge status: Home / Self Care | Payer: Medicare Other | Source: Ambulatory Visit | Attending: Neurological Surgery | Admitting: Neurological Surgery

## 2012-03-11 ENCOUNTER — Ambulatory Visit (HOSPITAL_COMMUNITY)
Admission: RE | Admit: 2012-03-11 | Discharge: 2012-03-11 | Disposition: A | Discharge status: Home / Self Care | Payer: Medicare Other | Source: Ambulatory Visit | Attending: Neurological Surgery | Admitting: Neurological Surgery

## 2012-03-11 HISTORY — DX: Essential (primary) hypertension: I10

## 2012-03-11 HISTORY — DX: Gastro-esophageal reflux disease without esophagitis: K21.9

## 2012-03-11 HISTORY — DX: Cardiac arrhythmia, unspecified: I49.9

## 2012-03-11 HISTORY — DX: Unspecified atrial fibrillation: I48.91

## 2012-03-11 HISTORY — DX: Unspecified osteoarthritis, unspecified site: M19.90

## 2012-03-11 HISTORY — DX: Hyperlipidemia, unspecified: E78.5

## 2012-03-11 LAB — CBC WITH DIFFERENTIAL/PLATELET
Basophils Absolute: 0 10*3/uL (ref 0.0–0.1)
Basophils Relative: 0 % (ref 0–1)
Eosinophils Absolute: 0.1 10*3/uL (ref 0.0–0.7)
Eosinophils Relative: 2 % (ref 0–5)
HCT: 48.3 % (ref 39.0–52.0)
Hemoglobin: 16.7 g/dL (ref 13.0–17.0)
Lymphocytes Relative: 25 % (ref 12–46)
Lymphs Abs: 1.8 10*3/uL (ref 0.7–4.0)
MCH: 30.5 pg (ref 26.0–34.0)
MCHC: 34.6 g/dL (ref 30.0–36.0)
MCV: 88.1 fL (ref 78.0–100.0)
Monocytes Absolute: 0.8 10*3/uL (ref 0.1–1.0)
Monocytes Relative: 11 % (ref 3–12)
Neutro Abs: 4.5 10*3/uL (ref 1.7–7.7)
Neutrophils Relative %: 63 % (ref 43–77)
Platelets: 188 10*3/uL (ref 150–400)
RBC: 5.48 MIL/uL (ref 4.22–5.81)
RDW: 13.3 % (ref 11.5–15.5)
WBC: 7.1 10*3/uL (ref 4.0–10.5)

## 2012-03-11 LAB — ABO/RH: ABO/RH(D): O POS

## 2012-03-11 LAB — TYPE AND SCREEN
ABO/RH(D): O POS
Antibody Screen: NEGATIVE

## 2012-03-11 LAB — BASIC METABOLIC PANEL
BUN: 16 mg/dL (ref 6–23)
CO2: 27 mEq/L (ref 19–32)
Calcium: 9.7 mg/dL (ref 8.4–10.5)
Chloride: 106 mEq/L (ref 96–112)
Creatinine, Ser: 0.85 mg/dL (ref 0.50–1.35)
GFR calc Af Amer: >90 mL/min (ref >90)
GFR calc non Af Amer: 84 mL/min — ABNORMAL LOW (ref >90)
Glucose, Bld: 80 mg/dL (ref 70–99)
Potassium: 4.8 mEq/L (ref 3.5–5.1)
Sodium: 141 mEq/L (ref 135–145)

## 2012-03-11 LAB — SURGICAL PCR SCREEN
MRSA, PCR: NEGATIVE
Staphylococcus aureus: NEGATIVE

## 2012-03-11 LAB — PROTIME-INR
INR: 1.29 (ref 0.00–1.49)
Prothrombin Time: 15.8 seconds — ABNORMAL HIGH (ref 11.6–15.2)

## 2012-03-11 NOTE — Progress Notes (Signed)
This patient has screened at an elevated risk for obstructive sleep apnea using the STOP Bang tool during a pre-surgical visit. A score of 4 or greater is an elevated risk for sleep apnea. 

## 2012-03-11 NOTE — Progress Notes (Signed)
Patient informed Nurse that he had a stress test and cardiac cath done with Dr. Verdis Prime at [facility]; records requested. No PCI; patient stated "they found out it was my gallbladder." Patient developed atrial fibrillation in October 2013 and was sent to [facility]; records requested. After which patient was seeing Dr. Bonnielee Haff (cardiologist) in [place]; records requested. Office # [phone] fax [phone]. PCP is Dr. Theressa Millard and LOV was December 2013.

## 2012-03-12 NOTE — Consult Note (Signed)
Anesthesia Chart Review:  Patient is a 72 year old male scheduled for L3-4, L4-5 maximum access PLIF by Dr. Yetta Barre on 03/24/12.  History includes non-smoker, HTN, GERD, kidney stones, afib/PAF, HLD, arthritis.  OSA screening score was 4 or greater.  He was hospitalized at [facility] in 11/2011 for syncope felt associated with hypotension from rapid afib.  PCP is Dr. Theressa Millard.    Current cardiologist is Dr. Wille Glaser with [facility].  He saw him on 02/03/12 and felt he would be "low cardiac risk for anticipated spine surgery."  EKG on 03/11/12 showed SB @ 56 bpm.  Echo on 11/12/11 showed mild concentric LVH with low normal systolic function, EF 53%.  No segmental abnormality.  Moderate-marked LA dilated.  Trivial MR.  Aortic sclerosis without significant valvular pathology.  Afib.  Stress test from 01/09/10 [facility]) showed no ischemia, normal EF 73%, low risk.  Cardiac cath from 05/04/07 ([facility]-Dr. Katrinka Blazing) showed: 1. Moderate coronary artery disease with 99% stenosis of small first diagonal, less than or equal to 50% mid left anterior descending stenosis, widely patent left anterior descending , and mild atherosclerosis in the mid right coronary less than 50%. 2. The left main has a bend/kink but no significant obstruction is noted. (The threshold for significant cross-sectional area in the left main is less than 6 sq mm).  3. Normal left ventricular function.  CXR on 03/11/12 showed no acute disease.  Preoperative labs noted.  If no acute changes in his status then would anticipate he could proceed as planned.  He will be evaluated by his assigned anesthesiologist on the day of surgery.  Shonna Chock, PA-C 03/12/12 1540

## 2012-03-17 MED ORDER — BUPIVACAINE HCL (PF) 0.5 % IJ SOLN
INTRAMUSCULAR | Status: AC
  Filled 2012-03-17: qty 30

## 2012-03-23 MED ORDER — CEFAZOLIN SODIUM-DEXTROSE 2-3 GM-% IV SOLR
2.0000 g | INTRAVENOUS | Status: AC
  Administered 2012-03-24: 2 g via INTRAVENOUS
  Filled 2012-03-23: qty 50

## 2012-03-24 ENCOUNTER — Encounter (HOSPITAL_COMMUNITY): Payer: Self-pay | Admitting: Vascular Surgery

## 2012-03-24 ENCOUNTER — Inpatient Hospital Stay (HOSPITAL_COMMUNITY)
Admission: RE | Admit: 2012-03-24 | Discharge: 2012-03-26 | DRG: 460 | Disposition: A | Discharge status: Home / Self Care | Payer: Medicare Other | Source: Ambulatory Visit | Attending: Neurological Surgery | Admitting: Neurological Surgery

## 2012-03-24 ENCOUNTER — Encounter (HOSPITAL_COMMUNITY)
Admission: RE | Disposition: A | Discharge status: Home / Self Care | Payer: Self-pay | Source: Ambulatory Visit | Attending: Neurological Surgery

## 2012-03-24 ENCOUNTER — Inpatient Hospital Stay (HOSPITAL_COMMUNITY): Payer: Medicare Other | Admitting: Vascular Surgery

## 2012-03-24 ENCOUNTER — Encounter (HOSPITAL_COMMUNITY): Payer: Self-pay | Admitting: *Deleted

## 2012-03-24 ENCOUNTER — Inpatient Hospital Stay (HOSPITAL_COMMUNITY): Payer: Medicare Other

## 2012-03-24 DIAGNOSIS — K219 Gastro-esophageal reflux disease without esophagitis: Secondary | ICD-10-CM | POA: Diagnosis present

## 2012-03-24 DIAGNOSIS — I10 Essential (primary) hypertension: Secondary | ICD-10-CM | POA: Diagnosis present

## 2012-03-24 DIAGNOSIS — M4310 Spondylolisthesis, site unspecified: Principal | ICD-10-CM | POA: Diagnosis present

## 2012-03-24 DIAGNOSIS — Z981 Arthrodesis status: Secondary | ICD-10-CM

## 2012-03-24 DIAGNOSIS — I4891 Unspecified atrial fibrillation: Secondary | ICD-10-CM | POA: Diagnosis present

## 2012-03-24 DIAGNOSIS — E785 Hyperlipidemia, unspecified: Secondary | ICD-10-CM | POA: Diagnosis present

## 2012-03-24 LAB — POCT I-STAT 4, (NA,K, GLUC, HGB,HCT)
Glucose, Bld: 117 mg/dL — ABNORMAL HIGH (ref 70–99)
HCT: 35 % — ABNORMAL LOW (ref 39.0–52.0)
Hemoglobin: 11.9 g/dL — ABNORMAL LOW (ref 13.0–17.0)
Potassium: 3.8 mEq/L (ref 3.5–5.1)
Sodium: 139 mEq/L (ref 135–145)

## 2012-03-24 SURGERY — FOR MAXIMUM ACCESS (MAS) POSTERIOR LUMBAR INTERBODY FUSION (PLIF) 2 LEVEL
Anesthesia: General | Wound class: Clean

## 2012-03-24 MED ORDER — ACETAMINOPHEN 325 MG PO TABS: 650.0000 mg | ORAL_TABLET | ORAL | Status: DC | PRN

## 2012-03-24 MED ORDER — SODIUM CHLORIDE 0.9 % IJ SOLN: 3.0000 mL | INTRAMUSCULAR | Status: DC | PRN

## 2012-03-24 MED ORDER — ALBUMIN HUMAN 5 % IV SOLN
INTRAVENOUS | Status: DC | PRN
  Administered 2012-03-24 (×2): via INTRAVENOUS

## 2012-03-24 MED ORDER — PANTOPRAZOLE SODIUM 40 MG PO TBEC
40.0000 mg | DELAYED_RELEASE_TABLET | Freq: Every day | ORAL | Status: DC
  Administered 2012-03-24 – 2012-03-26 (×3): 40 mg via ORAL
  Filled 2012-03-24 (×3): qty 1

## 2012-03-24 MED ORDER — METOPROLOL TARTRATE 25 MG PO TABS
25.0000 mg | ORAL_TABLET | Freq: Two times a day (BID) | ORAL | Status: DC
  Administered 2012-03-24 – 2012-03-26 (×4): 25 mg via ORAL
  Filled 2012-03-24 (×5): qty 1

## 2012-03-24 MED ORDER — 0.9 % SODIUM CHLORIDE (POUR BTL) OPTIME
TOPICAL | Status: DC | PRN
  Administered 2012-03-24: 1000 mL

## 2012-03-24 MED ORDER — SODIUM CHLORIDE 0.9 % IV SOLN: 250.0000 mL | INTRAVENOUS | Status: DC

## 2012-03-24 MED ORDER — OXYCODONE-ACETAMINOPHEN 5-325 MG PO TABS
1.0000 | ORAL_TABLET | ORAL | Status: DC | PRN
  Filled 2012-03-24: qty 2

## 2012-03-24 MED ORDER — AMLODIPINE BESYLATE 5 MG PO TABS
5.0000 mg | ORAL_TABLET | Freq: Every day | ORAL | Status: DC
  Administered 2012-03-25 – 2012-03-26 (×2): 5 mg via ORAL
  Filled 2012-03-24 (×2): qty 1

## 2012-03-24 MED ORDER — OXYCODONE HCL 5 MG/5ML PO SOLN: 5.0000 mg | Freq: Once | ORAL | Status: DC | PRN

## 2012-03-24 MED ORDER — CELECOXIB 200 MG PO CAPS
200.0000 mg | ORAL_CAPSULE | Freq: Two times a day (BID) | ORAL | Status: DC
  Administered 2012-03-24 – 2012-03-26 (×4): 200 mg via ORAL
  Filled 2012-03-24 (×5): qty 1

## 2012-03-24 MED ORDER — ACETAMINOPHEN 10 MG/ML IV SOLN
INTRAVENOUS | Status: AC
  Administered 2012-03-24: 1000 mg via INTRAVENOUS
  Filled 2012-03-24: qty 100

## 2012-03-24 MED ORDER — DEXAMETHASONE SODIUM PHOSPHATE 4 MG/ML IJ SOLN
4.0000 mg | Freq: Four times a day (QID) | INTRAMUSCULAR | Status: DC
  Filled 2012-03-24 (×8): qty 1

## 2012-03-24 MED ORDER — METHOCARBAMOL 500 MG PO TABS
500.0000 mg | ORAL_TABLET | Freq: Four times a day (QID) | ORAL | Status: DC | PRN
  Administered 2012-03-25: 500 mg via ORAL
  Filled 2012-03-24: qty 1

## 2012-03-24 MED ORDER — BACITRACIN 50000 UNITS IM SOLR
INTRAMUSCULAR | Status: AC
  Filled 2012-03-24: qty 1

## 2012-03-24 MED ORDER — ASPIRIN EC 81 MG PO TBEC
81.0000 mg | DELAYED_RELEASE_TABLET | Freq: Every day | ORAL | Status: DC
  Administered 2012-03-24 – 2012-03-26 (×3): 81 mg via ORAL
  Filled 2012-03-24 (×3): qty 1

## 2012-03-24 MED ORDER — OXYCODONE HCL 5 MG PO TABS: 5.0000 mg | ORAL_TABLET | Freq: Once | ORAL | Status: DC | PRN

## 2012-03-24 MED ORDER — ONDANSETRON HCL 4 MG/2ML IJ SOLN
INTRAMUSCULAR | Status: AC
  Filled 2012-03-24: qty 2

## 2012-03-24 MED ORDER — SODIUM CHLORIDE 0.9 % IR SOLN
Status: DC | PRN
  Administered 2012-03-24: 14:00:00

## 2012-03-24 MED ORDER — MIDAZOLAM HCL 5 MG/5ML IJ SOLN
INTRAMUSCULAR | Status: DC | PRN
  Administered 2012-03-24: 2 mg via INTRAVENOUS

## 2012-03-24 MED ORDER — LIDOCAINE HCL (CARDIAC) 20 MG/ML IV SOLN
INTRAVENOUS | Status: DC | PRN
  Administered 2012-03-24: 30 mg via INTRAVENOUS

## 2012-03-24 MED ORDER — THROMBIN 20000 UNITS EX SOLR
OROMUCOSAL | Status: DC | PRN
  Administered 2012-03-24: 16:00:00 via TOPICAL

## 2012-03-24 MED ORDER — SODIUM CHLORIDE 0.9 % IV SOLN
INTRAVENOUS | Status: AC
  Filled 2012-03-24: qty 500

## 2012-03-24 MED ORDER — MIDAZOLAM HCL 2 MG/2ML IJ SOLN: 0.5000 mg | Freq: Once | INTRAMUSCULAR | Status: DC | PRN

## 2012-03-24 MED ORDER — DEXAMETHASONE SODIUM PHOSPHATE 10 MG/ML IJ SOLN
10.0000 mg | INTRAMUSCULAR | Status: AC
  Administered 2012-03-24: 10 mg via INTRAVENOUS
  Filled 2012-03-24: qty 1

## 2012-03-24 MED ORDER — BUPIVACAINE HCL (PF) 0.25 % IJ SOLN
INTRAMUSCULAR | Status: DC | PRN
  Administered 2012-03-24 (×2): 10 mL

## 2012-03-24 MED ORDER — MORPHINE SULFATE 2 MG/ML IJ SOLN
1.0000 mg | INTRAMUSCULAR | Status: DC | PRN
  Administered 2012-03-24 – 2012-03-25 (×3): 2 mg via INTRAVENOUS
  Administered 2012-03-25: 4 mg via INTRAVENOUS
  Filled 2012-03-24: qty 2
  Filled 2012-03-24: qty 1

## 2012-03-24 MED ORDER — FENTANYL CITRATE 0.05 MG/ML IJ SOLN
INTRAMUSCULAR | Status: DC | PRN
  Administered 2012-03-24 (×2): 100 ug via INTRAVENOUS
  Administered 2012-03-24 (×2): 50 ug via INTRAVENOUS
  Administered 2012-03-24: 150 ug via INTRAVENOUS
  Administered 2012-03-24 (×3): 50 ug via INTRAVENOUS

## 2012-03-24 MED ORDER — PROMETHAZINE HCL 25 MG/ML IJ SOLN: 6.2500 mg | INTRAMUSCULAR | Status: DC | PRN

## 2012-03-24 MED ORDER — HYDROMORPHONE HCL PF 1 MG/ML IJ SOLN: 0.2500 mg | INTRAMUSCULAR | Status: DC | PRN

## 2012-03-24 MED ORDER — LACTATED RINGERS IV SOLN
INTRAVENOUS | Status: DC | PRN
  Administered 2012-03-24 (×4): via INTRAVENOUS

## 2012-03-24 MED ORDER — THROMBIN 20000 UNITS EX SOLR
OROMUCOSAL | Status: DC | PRN
  Administered 2012-03-24: 14:00:00 via TOPICAL

## 2012-03-24 MED ORDER — ONDANSETRON HCL 4 MG/2ML IJ SOLN
INTRAMUSCULAR | Status: DC | PRN
  Administered 2012-03-24: 4 mg via INTRAVENOUS

## 2012-03-24 MED ORDER — ARTIFICIAL TEARS OP OINT
TOPICAL_OINTMENT | OPHTHALMIC | Status: DC | PRN
  Administered 2012-03-24: 1 via OPHTHALMIC

## 2012-03-24 MED ORDER — ACETAMINOPHEN 10 MG/ML IV SOLN
1000.0000 mg | Freq: Four times a day (QID) | INTRAVENOUS | Status: AC
  Administered 2012-03-24 – 2012-03-25 (×2): 1000 mg via INTRAVENOUS
  Filled 2012-03-24 (×4): qty 100

## 2012-03-24 MED ORDER — ONDANSETRON HCL 4 MG/2ML IJ SOLN
4.0000 mg | INTRAMUSCULAR | Status: DC | PRN
  Administered 2012-03-24 – 2012-03-25 (×3): 4 mg via INTRAVENOUS
  Filled 2012-03-24 (×2): qty 2

## 2012-03-24 MED ORDER — PROPOFOL 10 MG/ML IV BOLUS
INTRAVENOUS | Status: DC | PRN
  Administered 2012-03-24: 100 mg via INTRAVENOUS

## 2012-03-24 MED ORDER — MENTHOL 3 MG MT LOZG: 1.0000 | LOZENGE | OROMUCOSAL | Status: DC | PRN

## 2012-03-24 MED ORDER — POTASSIUM CHLORIDE IN NACL 20-0.9 MEQ/L-% IV SOLN
INTRAVENOUS | Status: DC
  Administered 2012-03-24: 23:00:00 via INTRAVENOUS
  Administered 2012-03-25: 1000 mL via INTRAVENOUS
  Filled 2012-03-24 (×5): qty 1000

## 2012-03-24 MED ORDER — PHENOL 1.4 % MT LIQD: 1.0000 | OROMUCOSAL | Status: DC | PRN

## 2012-03-24 MED ORDER — MORPHINE SULFATE 2 MG/ML IJ SOLN
INTRAMUSCULAR | Status: AC
  Filled 2012-03-24: qty 1

## 2012-03-24 MED ORDER — MEPERIDINE HCL 25 MG/ML IJ SOLN: 6.2500 mg | INTRAMUSCULAR | Status: DC | PRN

## 2012-03-24 MED ORDER — PROPOFOL INFUSION 10 MG/ML OPTIME
INTRAVENOUS | Status: DC | PRN
  Administered 2012-03-24: 50 ug/kg/min via INTRAVENOUS

## 2012-03-24 MED ORDER — THROMBIN 5000 UNITS EX SOLR
OROMUCOSAL | Status: DC | PRN
  Administered 2012-03-24: 16:00:00 via TOPICAL

## 2012-03-24 MED ORDER — DEXAMETHASONE 4 MG PO TABS
4.0000 mg | ORAL_TABLET | Freq: Four times a day (QID) | ORAL | Status: DC
  Administered 2012-03-24 – 2012-03-26 (×8): 4 mg via ORAL
  Filled 2012-03-24 (×11): qty 1

## 2012-03-24 MED ORDER — HYDROMORPHONE HCL PF 1 MG/ML IJ SOLN
INTRAMUSCULAR | Status: AC
  Filled 2012-03-24: qty 1

## 2012-03-24 MED ORDER — ACETAMINOPHEN 650 MG RE SUPP: 650.0000 mg | RECTAL | Status: DC | PRN

## 2012-03-24 MED ORDER — SODIUM CHLORIDE 0.9 % IJ SOLN
3.0000 mL | Freq: Two times a day (BID) | INTRAMUSCULAR | Status: DC
  Administered 2012-03-24 – 2012-03-26 (×3): 3 mL via INTRAVENOUS

## 2012-03-24 MED ORDER — CEFAZOLIN SODIUM 1-5 GM-% IV SOLN
1.0000 g | Freq: Three times a day (TID) | INTRAVENOUS | Status: AC
  Administered 2012-03-24 – 2012-03-25 (×2): 1 g via INTRAVENOUS
  Filled 2012-03-24 (×2): qty 50

## 2012-03-24 MED ORDER — ACETAMINOPHEN 10 MG/ML IV SOLN
1000.0000 mg | Freq: Once | INTRAVENOUS | Status: AC
  Administered 2012-03-24: 1000 mg via INTRAVENOUS
  Filled 2012-03-24: qty 100

## 2012-03-24 MED ORDER — METHOCARBAMOL 100 MG/ML IJ SOLN: 500.0000 mg | Freq: Four times a day (QID) | INTRAVENOUS | Status: DC | PRN

## 2012-03-24 MED ORDER — SUCCINYLCHOLINE CHLORIDE 20 MG/ML IJ SOLN
INTRAMUSCULAR | Status: DC | PRN
  Administered 2012-03-24: 120 mg via INTRAVENOUS

## 2012-03-24 MED ORDER — EPHEDRINE SULFATE 50 MG/ML IJ SOLN
INTRAMUSCULAR | Status: DC | PRN
  Administered 2012-03-24: 15 mg via INTRAVENOUS
  Administered 2012-03-24: 10 mg via INTRAVENOUS
  Administered 2012-03-24 (×2): 5 mg via INTRAVENOUS

## 2012-03-24 SURGICAL SUPPLY — 68 items
APL SKNCLS STERI-STRIP NONHPOA (GAUZE/BANDAGES/DRESSINGS) ×1
BAG DECANTER FOR FLEXI CONT (MISCELLANEOUS) ×2 IMPLANT
BENZOIN TINCTURE PRP APPL 2/3 (GAUZE/BANDAGES/DRESSINGS) ×2 IMPLANT
BLADE SURG ROTATE 9660 (MISCELLANEOUS) ×1 IMPLANT
BONE MATRIX OSTEOCEL PLUS 10CC (Bone Implant) ×1 IMPLANT
BUR MATCHSTICK NEURO 3.0 LAGG (BURR) ×2 IMPLANT
CAGE COROENT 12X28X4 (Cage) ×2 IMPLANT
CANISTER SUCTION 2500CC (MISCELLANEOUS) ×2 IMPLANT
CLIP NEUROVISION LG (CLIP) ×1 IMPLANT
CLOTH BEACON ORANGE TIMEOUT ST (SAFETY) ×2 IMPLANT
CONT SPEC 4OZ CLIKSEAL STRL BL (MISCELLANEOUS) ×4 IMPLANT
COROENT 10X9X28-4 (Spacer) ×2 IMPLANT
COVER BACK TABLE 24X17X13 BIG (DRAPES) IMPLANT
COVER TABLE BACK 60X90 (DRAPES) ×2 IMPLANT
DRAPE C-ARM 42X72 X-RAY (DRAPES) ×2 IMPLANT
DRAPE C-ARMOR (DRAPES) ×2 IMPLANT
DRAPE LAPAROTOMY 100X72X124 (DRAPES) ×2 IMPLANT
DRAPE POUCH INSTRU U-SHP 10X18 (DRAPES) ×2 IMPLANT
DRAPE SURG 17X23 STRL (DRAPES) ×2 IMPLANT
DRESSING TELFA 8X3 (GAUZE/BANDAGES/DRESSINGS) ×2 IMPLANT
DRSG OPSITE 4X5.5 SM (GAUZE/BANDAGES/DRESSINGS) ×3 IMPLANT
DURAPREP 26ML APPLICATOR (WOUND CARE) ×2 IMPLANT
ELECT REM PT RETURN 9FT ADLT (ELECTROSURGICAL) ×2
ELECTRODE REM PT RTRN 9FT ADLT (ELECTROSURGICAL) ×1 IMPLANT
EVACUATOR 1/8 PVC DRAIN (DRAIN) ×2 IMPLANT
GAUZE SPONGE 4X4 16PLY XRAY LF (GAUZE/BANDAGES/DRESSINGS) IMPLANT
GLOVE BIO SURGEON STRL SZ8 (GLOVE) ×4 IMPLANT
GLOVE BIOGEL PI IND STRL 7.0 (GLOVE) IMPLANT
GLOVE BIOGEL PI INDICATOR 7.0 (GLOVE) ×1
GLOVE INDICATOR 8.5 STRL (GLOVE) ×1 IMPLANT
GLOVE SURG SS PI 7.0 STRL IVOR (GLOVE) ×2 IMPLANT
GOWN BRE IMP SLV AUR LG STRL (GOWN DISPOSABLE) IMPLANT
GOWN BRE IMP SLV AUR XL STRL (GOWN DISPOSABLE) ×5 IMPLANT
GOWN STRL REIN 2XL LVL4 (GOWN DISPOSABLE) IMPLANT
HEMOSTAT POWDER KIT SURGIFOAM (HEMOSTASIS) ×1 IMPLANT
KIT BASIN OR (CUSTOM PROCEDURE TRAY) ×2 IMPLANT
KIT NDL NVM5 EMG ELECT (KITS) IMPLANT
KIT NEEDLE NVM5 EMG ELECT (KITS) ×1 IMPLANT
KIT NEEDLE NVM5 EMG ELECTRODE (KITS) ×1
KIT ROOM TURNOVER OR (KITS) ×2 IMPLANT
MILL MEDIUM DISP (BLADE) IMPLANT
NDL HYPO 25X1 1.5 SAFETY (NEEDLE) ×1 IMPLANT
NEEDLE HYPO 25X1 1.5 SAFETY (NEEDLE) ×2 IMPLANT
NS IRRIG 1000ML POUR BTL (IV SOLUTION) ×2 IMPLANT
PACK LAMINECTOMY NEURO (CUSTOM PROCEDURE TRAY) ×2 IMPLANT
PAD ARMBOARD 7.5X6 YLW CONV (MISCELLANEOUS) ×6 IMPLANT
PATTIES SURGICAL 1X1 (DISPOSABLE) ×1 IMPLANT
ROD 70MM (Rod) ×4 IMPLANT
ROD SPNL 70XPREBNT NS MAS (Rod) IMPLANT
SCREW LOCK (Screw) ×12 IMPLANT
SCREW LOCK FXNS SPNE MAS PL (Screw) IMPLANT
SCREW PLIF MAS 5.0X35 (Screw) ×2 IMPLANT
SCREW SHANK 5.0X30MM (Screw) ×3 IMPLANT
SCREW SHANK 5.5X40MM (Screw) ×1 IMPLANT
SCREW TULIP 5.5 (Screw) ×4 IMPLANT
SPONGE LAP 4X18 X RAY DECT (DISPOSABLE) IMPLANT
SPONGE SURGIFOAM ABS GEL 100 (HEMOSTASIS) ×3 IMPLANT
STRIP CLOSURE SKIN 1/2X4 (GAUZE/BANDAGES/DRESSINGS) ×3 IMPLANT
SUT VIC AB 0 CT1 18XCR BRD8 (SUTURE) ×1 IMPLANT
SUT VIC AB 0 CT1 8-18 (SUTURE) ×2
SUT VIC AB 2-0 CP2 18 (SUTURE) ×2 IMPLANT
SUT VIC AB 3-0 SH 8-18 (SUTURE) ×5 IMPLANT
SYR 20ML ECCENTRIC (SYRINGE) ×2 IMPLANT
TOWEL OR 17X24 6PK STRL BLUE (TOWEL DISPOSABLE) ×2 IMPLANT
TOWEL OR 17X26 10 PK STRL BLUE (TOWEL DISPOSABLE) ×2 IMPLANT
TRAP SPECIMEN MUCOUS 40CC (MISCELLANEOUS) IMPLANT
TRAY FOLEY CATH 14FRSI W/METER (CATHETERS) ×2 IMPLANT
WATER STERILE IRR 1000ML POUR (IV SOLUTION) ×2 IMPLANT

## 2012-03-24 NOTE — Anesthesia Procedure Notes (Signed)
Procedure Name: Intubation Date/Time: 03/24/2012 12:54 PM Performed by: Jefm Miles E Pre-anesthesia Checklist: Patient identified, Timeout performed, Emergency Drugs available, Suction available and Patient being monitored Patient Re-evaluated:Patient Re-evaluated prior to inductionOxygen Delivery Method: Circle system utilized Preoxygenation: Pre-oxygenation with 100% oxygen Intubation Type: IV induction and Rapid sequence Ventilation: Mask ventilation without difficulty Laryngoscope Size: Mac and 3 Grade View: Grade I Tube type: Oral Tube size: 7.5 mm Number of attempts: 1 Airway Equipment and Method: Stylet Placement Confirmation: ETT inserted through vocal cords under direct vision,  breath sounds checked- equal and bilateral and positive ETCO2 Secured at: 23 cm Tube secured with: Tape Dental Injury: Teeth and Oropharynx as per pre-operative assessment

## 2012-03-24 NOTE — Anesthesia Postprocedure Evaluation (Signed)
Anesthesia Post Note  Patient: [name]  Procedure(s) Performed: Procedure(s) (LRB): MAXIMUM ACCESS (MAS) POSTERIOR LUMBAR INTERBODY FUSION (PLIF) 2 LEVEL (N/A)  Anesthesia type: general  Patient location: PACU  Post pain: Pain level controlled  Post assessment: Patient's Cardiovascular Status Stable  Last Vitals:  Filed Vitals:   03/24/12 1915  BP: 126/62  Pulse: 78  Temp:   Resp: 19    Post vital signs: Reviewed and stable  Level of consciousness: sedated  Complications: No apparent anesthesia complications

## 2012-03-24 NOTE — Anesthesia Preprocedure Evaluation (Addendum)
Anesthesia Evaluation  Patient identified by MRN, date of birth, ID band Patient awake    Reviewed: Allergy & Precautions, H&P , NPO status , Patient's Chart, lab work & pertinent test results, reviewed documented beta blocker date and time   History of Anesthesia Complications Negative for: history of anesthetic complications  Airway Mallampati: II TM Distance: >3 FB Neck ROM: Full    Dental  (+) Teeth Intact, Partial Lower and Dental Advisory Given   Pulmonary former smoker,  breath sounds clear to auscultation  Pulmonary exam normal       Cardiovascular hypertension, Pt. on home beta blockers and Pt. on medications + CAD (moderate disease by cath '09) + dysrhythmias (off xarelto for 7 days) Atrial Fibrillation Rate:Normal  Echo on 11/12/11 showed mild concentric LVH with low normal systolic function, EF 53%.  No segmental abnormality.  Moderate-marked LA dilated.  Trivial MR.  Aortic sclerosis without significant valvular pathology.  Afib.   Stress [facility] from 01/09/10 [facility]) showed no ischemia, normal EF 73%, low risk.   Cardiac cath from 05/04/07 ([facility]-Dr. Katrinka Blazing) showed: 1. Moderate coronary artery disease with 99% stenosis of small first diagonal, less than or equal to 50% mid left anterior descending stenosis, widely patent left anterior descending , and mild atherosclerosis in the mid right coronary less than 50%. 2. The left main has a bend/kink but no significant obstruction is noted. (The threshold for significant cross-sectional area in the left main is less than 6 sq mm).   3. Normal left ventricular function.    Neuro/Psych Chronic back pain: narcotics    GI/Hepatic Neg liver ROS, GERD-  Medicated and Controlled,  Endo/Other  negative endocrine ROS  Renal/GU negative Renal ROS     Musculoskeletal   Abdominal   Peds  Hematology   Anesthesia Other Findings   Reproductive/Obstetrics                          Anesthesia Physical Anesthesia Plan  ASA: III  Anesthesia Plan: General   Post-op Pain Management:    Induction: Intravenous  Airway Management Planned: Oral ETT  Additional Equipment:   Intra-op Plan:   Post-operative Plan: Extubation in OR  Informed Consent: I have reviewed the patients History and Physical, chart, labs and discussed the procedure including the risks, benefits and alternatives for the proposed anesthesia with the patient or authorized representative who has indicated his/her understanding and acceptance.   Dental advisory given  Plan Discussed with: Surgeon and CRNA  Anesthesia Plan Comments: (Plan routine monitors, GETA)        Anesthesia Quick Evaluation

## 2012-03-24 NOTE — Transfer of Care (Signed)
Immediate Anesthesia Transfer of Care Note  Patient: [name]  Procedure(s) Performed: Procedure(s) with comments: MAXIMUM ACCESS (MAS) POSTERIOR LUMBAR INTERBODY FUSION (PLIF) 2 LEVEL (N/A) - Maximum Access Surgery, Posterior Lumbar Interbody Fusion, Lumbar Three-Four, Four-Five  Patient Location: PACU  Anesthesia Type:General  Level of Consciousness: sedated  Airway & Oxygen Therapy: Patient Spontanous Breathing and Patient connected to face mask oxygen  Post-op Assessment: Report given to PACU RN and Post -op Vital signs reviewed and stable  Post vital signs: Reviewed and stable  Complications: No apparent anesthesia complications

## 2012-03-24 NOTE — Preoperative (Signed)
Beta Blockers   Reason not to administer Beta Blockers:Not Applicable, last dose 03/24/12 at 07:00

## 2012-03-24 NOTE — Op Note (Signed)
03/24/2012  5:03 PM  PATIENT:  [name]  72 y.o. 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 Decompressive lumbar laminectomy L3-4, L4-5 requiring more work than would be required of the typical PLIF procedure in order to adequately decompress the neural elements.  2. Posterior lumbar interbody fusion L3-4, L4-5 using  PEEK interbody cages packed with morcellized allograft and autograft.  3. Posterior fixation L3-5 using Nuvasive cortical pedicle screws.  4. Intertransverse arthrodesis L3-4, L4-5 using morcellized autograft and allograft.  SURGEON:  Marikay Alar, MD  ASSISTANTS: Dr. Wynetta Emery  ANESTHESIA:  General  EBL: Thousand ml  Total I/O In: 3500 [I.V.:3000; IV Piggyback:500] Out: 1300 [Urine:300; Blood:1000]  BLOOD ADMINISTERED:none  DRAINS: Hemovac   INDICATION FOR PROCEDURE: This patient presented with a long history of back and bilateral leg pain worse on the right. MRI showed spondylolisthesis at L3-4 with severe stenosis at L3-4 L4-5. He tried medical management without relief. I recommended a 2 level decompression and instrumented fusion in hopes of improving his pain syndrome Patient understood the risks, benefits, and alternatives and potential outcomes and wished to proceed.  PROCEDURE DETAILS:  The patient was brought to the operating room. After induction of generalized endotracheal anesthesia the patient was rolled into the prone position on chest rolls and all pressure points were padded. The patient's lumbar region was cleaned and then prepped with DuraPrep and draped in the usual sterile fashion. Anesthesia was injected and then a dorsal midline incision was made and carried down to the lumbosacral fascia. The fascia was opened and the paraspinous musculature was taken down in a subperiosteal fashion to expose L3-4 and L4-5. Intraoperative fluoroscopy  confirmed my level and I started with placement of bicortical pedicle screws at L3 and L4. Intraoperative EMG monitoring was used throughout the entirety of the case. I used AP and lateral fluoroscopy to verify the cortical pedicle screw entry zones and then drilled in an upward and outward direction into the pedicle and then tapped with the cortical tap. I then placed 50 by 35 mm pedicle screws into the pedicles of L3 and L4 bilaterally. I then turned my attention to the decompression, and the spinous process was removed and complete lumbar laminectomies, hemi- facetectomies, and foraminotomies were performed at L3-4 and L4-5. The yellow ligament was removed to expose the underlying dura and nerve roots, and generous foraminotomies were performed to adequately decompress the neural elements. Once the decompression was complete, I turned my attention to the posterior lower lumbar interbody fusion. The epidural venous vasculature was coagulated and cut sharply. Disc space was incised and the initial discectomy was performed with pituitary rongeurs. The disc space was distracted with sequential distractors to a height of 12 mm. We then used a series of scrapers and shavers to prepare the endplates for fusion. The midline was prepared with Epstein curettes. Once the complete discectomy was finished, we packed an appropriate sized peek interbody cage with local autograft and morcellized allograft, gently retracted the nerve root, and tapped the cage into position at L3-4 and L4-5 bilaterally. The midline was packed with morselized autograft and allograft. We then turned our attention to the posterior fixation at L5. The pedicle screw entry zones were identified utilizing surface landmarks and fluoroscopy. We probed each pedicle with the pedicle probe and tapped each pedicle with the appropriate tap. We palpated with a ball probe to assure no break in  the cortex. We then placed 50 by 35 mm pedicle screws into the  pedicles bilaterally at L5. We then decorticated the transverse processes and laid a mixture of morcellized autograft and allograft out over these to perform intertransverse arthrodesis at L3-L5. We then placed lordotic rods into the multiaxial screw heads of the pedicle screws and locked these in position with the locking caps and anti-torque device. We achieved compression of our grafts. We then checked our construct with AP and lateral fluoroscopy. Irrigated with copious amounts of bacitracin-containing saline solution. Placed a medium Hemovac drain through separate stab incision. Inspected the nerve roots once again to assure adequate decompression, lined to the dura with Gelfoam, and closed the muscle and the fascia with 0 Vicryl. Closed the subcutaneous tissues with 2-0 Vicryl and subcuticular tissues with 3-0 Vicryl. The skin was closed with benzoin and Steri-Strips. Dressing was then applied, the patient was awakened from general anesthesia and transported to the recovery room in stable condition. At the end of the procedure all sponge, needle and instrument counts were correct.   PLAN OF CARE: Admit to inpatient   PATIENT DISPOSITION:  PACU - hemodynamically stable.   Delay start of Pharmacological VTE agent (>24hrs) due to surgical blood loss or risk of bleeding:  yes

## 2012-03-24 NOTE — OR Nursing (Signed)
Nuvasive provided Neuro monitoring during procedure. The needles were inserted by OR nursing staff with guidance from [facility] Rep. Sites were assessed pre insertion.

## 2012-03-24 NOTE — H&P (Signed)
Subjective: Patient is a 72 y.o. male admitted for PLIF L3-4, L4-5. Onset of symptoms was several months ago, gradually worsening since that time.  The pain is rated severe, and is located at the across the lower back and radiates to legs. The pain is described as aching and soreness and occurs all day. The symptoms have been progressive. Symptoms are exacerbated by exercise and standing. 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   Past Medical History  Diagnosis Date  . Hypertension   . Dysrhythmia   . Atrial fibrillation   . GERD (gastroesophageal reflux disease)   . Kidney stone     x 1 passed kidney stone  . Hyperlipemia   . Arthritis     Past Surgical History  Procedure Laterality Date  . Foot surgery      left heel  . Hernia repair    . Cholecystectomy    . Shoulder surgery      bilateral  . Colonoscopy w/ polypectomy    . Eye surgery      cataract removal bilateral eyes  . Cardiac catheterization      no PCI    Prior to Admission medications   Medication Sig Start Date End Date Taking? Authorizing Provider  acetaminophen (TYLENOL) 500 MG tablet Take 500 mg by mouth every 6 (six) hours as needed. For headache/pain   Yes Historical Provider, MD  amLODipine (NORVASC) 5 MG tablet Take 5 mg by mouth daily.   Yes Historical Provider, MD  aspirin EC 81 MG tablet Take 81 mg by mouth daily.   Yes Historical Provider, MD  cholecalciferol (VITAMIN D) 1000 UNITS tablet Take 1,000 Units by mouth daily.   Yes Historical Provider, MD  dexlansoprazole (DEXILANT) 60 MG capsule Take 60 mg by mouth daily.   Yes Historical Provider, MD  metoprolol tartrate (LOPRESSOR) 25 MG tablet Take 25 mg by mouth 2 (two) times daily.   Yes Historical Provider, MD  Omega-3 Fatty Acids (FISH OIL) 300 MG CAPS Take 1 capsule by mouth daily.   Yes Historical Provider, MD  Rivaroxaban (XARELTO) 20 MG TABS Take 20 mg by mouth daily.   Yes Historical Provider, MD  simvastatin (ZOCOR) 20  MG tablet Take 20 mg by mouth every evening.   Yes Historical Provider, MD   No Known Allergies  History  Substance Use Topics  . Smoking status: Never Smoker   . Smokeless tobacco: Not on file  . Alcohol Use: No    History reviewed. No pertinent family history.   Review of Systems  Positive ROS: neg  All other systems have been reviewed and were otherwise negative with the exception of those mentioned in the HPI and as above.  Objective: Vital signs in last 24 hours: Temp:  [97.6 F (36.4 C)] 97.6 F (36.4 C) (02/12 1001) Pulse Rate:  [55] 55 (02/12 1001) Resp:  [18] 18 (02/12 1001) BP: (145)/(88) 145/88 mmHg (02/12 1001) SpO2:  [95 %] 95 % (02/12 1001)  General Appearance: Alert, cooperative, no distress, appears stated age Head: Normocephalic, without obvious abnormality, atraumatic Eyes: PERRL, conjunctiva/corneas clear, EOM's intact, fundi benign, both eyes      Ears: Normal TM's and external ear canals, both ears Throat: Lips, mucosa, and tongue normal; teeth and gums normal Neck: Supple, symmetrical, trachea midline, no adenopathy; thyroid: No enlargement/tenderness/nodules; no carotid bruit or JVD Back: Symmetric, no curvature, ROM normal, no CVA tenderness Lungs: Clear to auscultation bilaterally, respirations unlabored Heart: Regular rate and  rhythm, S1 and S2 normal, no murmur, rub or gallop Abdomen: Soft, non-tender, bowel sounds active all four quadrants, no masses, no organomegaly Extremities: Extremities normal, atraumatic, no cyanosis or edema Pulses: 2+ and symmetric all extremities Skin: Skin color, texture, turgor normal, no rashes or lesions  NEUROLOGIC:   Mental status: Alert and oriented x4,  no aphasia, good attention span, fund of knowledge, and memory Motor Exam - grossly normal Sensory Exam - grossly normal Reflexes: 1+ Coordination - grossly normal Gait - antalgic Balance - grossly normal Cranial Nerves: I: smell Not tested  II: visual  acuity  OS: nl    OD: nl  II: visual fields Full to confrontation  II: pupils Equal, round, reactive to light  III,VII: ptosis None  III,IV,VI: extraocular muscles  Full ROM  V: mastication Normal  V: facial light touch sensation  Normal  V,VII: corneal reflex  Present  VII: facial muscle function - upper  Normal  VII: facial muscle function - lower Normal  VIII: hearing Not tested  IX: soft palate elevation  Normal  IX,X: gag reflex Present  XI: trapezius strength  5/5  XI: sternocleidomastoid strength 5/5  XI: neck flexion strength  5/5  XII: tongue strength  Normal    Data Review Lab Results  Component Value Date   WBC 7.1 03/11/2012   HGB 16.7 03/11/2012   HCT 48.3 03/11/2012   MCV 88.1 03/11/2012   PLT 188 03/11/2012   Lab Results  Component Value Date   NA 141 03/11/2012   K 4.8 03/11/2012   CL 106 03/11/2012   CO2 27 03/11/2012   BUN 16 03/11/2012   CREATININE 0.85 03/11/2012   GLUCOSE 80 03/11/2012   Lab Results  Component Value Date   INR 1.29 03/11/2012    Assessment/Plan: Patient admitted for 2 level PLIF. Patient has failed conservative therapy.  I explained the condition and procedure to the patient and answered any questions.  Patient wishes to proceed with procedure as planned. Understands risks/ benefits and typical outcomes of procedure.   Cerinity Zynda S 03/24/2012 12:17 PM

## 2012-03-25 MED ORDER — OXYCODONE HCL 5 MG PO TABS
5.0000 mg | ORAL_TABLET | ORAL | Status: DC | PRN
  Administered 2012-03-25 – 2012-03-26 (×2): 10 mg via ORAL
  Filled 2012-03-25 (×2): qty 2

## 2012-03-25 MED ORDER — OXYCODONE HCL 5 MG PO TABS
5.0000 mg | ORAL_TABLET | ORAL | Status: DC | PRN
  Administered 2012-03-25: 10 mg via ORAL
  Filled 2012-03-25: qty 2

## 2012-03-25 NOTE — Progress Notes (Signed)
UR COMPLETED  

## 2012-03-25 NOTE — Evaluation (Signed)
Physical Therapy Evaluation Patient Details Name: [name] MRN: [id number] DOB: [birth year] Today's Date: 03/25/2012 Time: 1914-7829 PT Time Calculation (min): 32 min  PT Assessment / Plan / Recommendation Clinical Impression  72 y/o male s/p PLIF L3-4, L4-5 POD #1. Presents to PT with below impairments affecting safety and independence with mobility. Will benefit physical therapy in the acute setting to maximize functional independence for safe d/c home.     PT Assessment  Patient needs continued PT services    Follow Up Recommendations  Home health PT;Supervision for mobility/OOB    Does the patient have the potential to tolerate intense rehabilitation      Barriers to Discharge        Equipment Recommendations  None recommended by PT    Recommendations for Other Services     Frequency Min 5X/week    Precautions / Restrictions Precautions Precautions: Back Precaution Comments: back handout Required Braces or Orthoses: Spinal Brace Spinal Brace: Lumbar corset;Applied in sitting position   Pertinent Vitals/Pain Complaining 9/10 pain following our session, RN had provided meds prior to ambulation (rest and repositioning alleviated some)     Mobility  Bed Mobility Bed Mobility: Rolling Left;Left Sidelying to Sit Rolling Left: 3: Mod assist Left Sidelying to Sit: 3: Mod assist;HOB flat Details for Bed Mobility Assistance: sequencing cues and faciliation for follow through of safe movement Transfers Transfers: Sit to Stand;Stand to Sit Sit to Stand: 3: Mod assist Stand to Sit: 3: Mod assist;With upper extremity assist;To chair/3-in-1 Details for Transfer Assistance: pt with mod v/c for safety with hand placment and RW use Ambulation/Gait Ambulation/Gait Assistance: 4: Min guard Ambulation Distance (Feet): 80 Feet Assistive device: Rolling walker Ambulation/Gait Assistance Details: cues for tall posture and safe positioning within RW especially with turns adhering  to back precautions Gait velocity: decreased General Gait Details: decreased clearance of right LE during swing (pt reports weakness prior to surgery) Stairs: No         PT Diagnosis: Difficulty walking;Abnormality of gait;Generalized weakness;Acute pain  PT Problem List: Decreased strength;Decreased activity tolerance;Decreased mobility;Pain;Decreased knowledge of precautions;Decreased knowledge of use of DME PT Treatment Interventions: DME instruction;Gait training;Stair training;Functional mobility training;Therapeutic activities;Therapeutic exercise;Balance training;Neuromuscular re-education;Patient/family education   PT Goals Acute Rehab PT Goals PT Goal Formulation: With patient Time For Goal Achievement: 04/01/12 Potential to Achieve Goals: Good Pt will Roll Supine to Right Side: with supervision PT Goal: Rolling Supine to Right Side - Progress: Goal set today Pt will Roll Supine to Left Side: with supervision PT Goal: Rolling Supine to Left Side - Progress: Goal set today Pt will go Supine/Side to Sit: with supervision;with HOB 0 degrees PT Goal: Supine/Side to Sit - Progress: Goal set today Pt will go Sit to Supine/Side: with supervision;with HOB 0 degrees PT Goal: Sit to Supine/Side - Progress: Goal set today Pt will go Sit to Stand: with supervision PT Goal: Sit to Stand - Progress: Goal set today Pt will go Stand to Sit: with supervision PT Goal: Stand to Sit - Progress: Goal set today Pt will Transfer Bed to Chair/Chair to Bed: with supervision PT Transfer Goal: Bed to Chair/Chair to Bed - Progress: Goal set today Pt will Ambulate: >150 feet;with supervision;with least restrictive assistive device PT Goal: Ambulate - Progress: Goal set today Pt will Go Up / Down Stairs: 1-2 stairs;with min assist PT Goal: Up/Down Stairs - Progress: Goal set today Additional Goals Additional Goal #1: Pt will verbalize and demonstrate knowledge of 3/3 back precautions.  PT Goal:  Additional Goal #1 - Progress: Goal set today  Visit Information  Last PT Received On: 03/25/12 Assistance Needed: +1    Subjective Data  Subjective: Does my breath stink? Patient Stated Goal: home   Prior Functioning  Home Living Lives With: Spouse Available Help at Discharge: Available 24 hours/day Type of Home: House Home Access: Stairs to enter [employer] of Steps: 1 Entrance Stairs-Rails: Left Home Layout: One level Bathroom Shower/Tub: Walk-in shower (build in seat, hand held shower , grab bars) Bathroom Toilet: Handicapped height (vanity on Rt) Home Adaptive Equipment: Walker - rolling;Straight cane;Hand-held shower hose;Grab bars in shower Prior Function Level of Independence: Independent Able to Take Stairs?: Yes Driving: Yes Vocation: Retired Musician: No difficulties Dominant Hand: Right    Cognition  Cognition Overall Cognitive Status: Appears within functional limits for tasks assessed/performed Arousal/Alertness: Awake/alert Orientation Level: Appears intact for tasks assessed Behavior During Session: [facility] for tasks performed    Extremity/Trunk Assessment Right Upper Extremity Assessment RUE ROM/Strength/Tone: WFL for tasks assessed RUE Sensation: WFL - Light Touch RUE Coordination: WFL - gross/fine motor Left Upper Extremity Assessment LUE ROM/Strength/Tone: WFL for tasks assessed LUE Sensation: WFL - Light Touch LUE Coordination: WFL - gross/fine motor Right Lower Extremity Assessment RLE ROM/Strength/Tone: Deficits RLE ROM/Strength/Tone Deficits: grossly 4/5  RLE Sensation: WFL - Light Touch Left Lower Extremity Assessment LLE ROM/Strength/Tone: WFL for tasks assessed Trunk Assessment Trunk Assessment: Normal   Balance Balance Balance Assessed: Yes Static Standing Balance Static Standing - Balance Support: Left upper extremity supported;During functional activity Static Standing - Level of Assistance: 5: Stand by  assistance  End of Session PT - End of Session Equipment Utilized During Treatment: Gait belt Activity Tolerance: Patient tolerated treatment well Patient left: in chair;with call bell/phone within reach  GP     [facility] HELEN 03/25/2012, 11:11 AM

## 2012-03-25 NOTE — Progress Notes (Signed)
Subjective: Patient reports He is feeling well is in no leg pain his back is sore but getting better and his nausea has resolved overnight  Objective: Vital signs in last 24 hours: Temp:  [97.6 F (36.4 C)-98 F (36.7 C)] 97.9 F (36.6 C) (02/13 1478) Pulse Rate:  [55-88] 63 (02/13 0632) Resp:  [14-20] 16 (02/13 2956) BP: (101-145)/(53-88) 109/53 mmHg (02/13 0632) SpO2:  [94 %-97 %] 95 % (02/13 2130) Weight:  [91.4 kg (201 lb 8 oz)] 91.4 kg (201 lb 8 oz) (02/12 2014)  Intake/Output from previous day: 02/12 0701 - 02/13 0700 In: 4023 [P.O.:120; I.V.:3003; IV Piggyback:900] Out: 2995 [Urine:1825; Drains:170; Blood:1000] Intake/Output this shift: Total I/O In: 123 [P.O.:120; I.V.:3] Out: 1430 [Urine:1300; Drains:130]  Awake alert oriented strength 5 out of 5 wound is clean and dry drain output 40  Lab Results:  Recent Labs  03/24/12 1605  HGB 11.9*  HCT 35.0*   BMET  Recent Labs  03/24/12 1605  NA 139  K 3.8  GLUCOSE 117*    Studies/Results: Dg Lumbar Spine 2-3 Views  03/24/2012  *RADIOLOGY REPORT*  Clinical Data: L3-5 posterior fusion  LUMBAR SPINE - 2-3 VIEW  Comparison: 11/17/2011  Findings: Spot fluoroscopic intraoperative views demonstrate posterior laminectomy and fusion with interbody disc spaces at L3-4 and L4-5. Alignment appears anatomic.  Expected postoperative appearance.  IMPRESSION: Status post L3-4 and L4-5 posterior laminectomy and fusion. No acute finding by plain radiography.   Original Report Authenticated By: Judie Petit. Miles Costain, M.D.     Assessment/Plan: Continue to mobilize physical outpatient therapy DC his Foley   LOS: 1 day     Azana Kiesler P 03/25/2012, 6:51 AM

## 2012-03-25 NOTE — Evaluation (Signed)
Occupational Therapy Evaluation Patient Details Name: [name] MRN: [id number] DOB: [birth year] Today's Date: 03/25/2012 Time: 2130-8657 OT Time Calculation (min): 29 min  OT Assessment / Plan / Recommendation Clinical Impression  72 yo male s/p PLIF L3-4 L4-5 that coudl benefit from skilled OT acutely. Recommend HHOT for d/c planning. Wife and patient requesting to stay until Saturday 03/27/12.    OT Assessment  Patient needs continued OT Services    Follow Up Recommendations  Home health OT    Barriers to Discharge      Equipment Recommendations  None recommended by OT    Recommendations for Other Services    Frequency  Min 2X/week    Precautions / Restrictions Precautions Precautions: Back Precaution Comments: back handout Required Braces or Orthoses: Spinal Brace Spinal Brace: Lumbar corset;Applied in sitting position   Pertinent Vitals/Pain Premedicated by RN 6 out 10 Ambulating and ADLS at sink 10 out 10  Pt positioned in chair    ADL  Eating/Feeding: Modified independent Where Assessed - Eating/Feeding: Chair Grooming: Wash/dry face;Teeth care;Set up Where Assessed - Grooming: Unsupported standing Lower Body Bathing: +1 Total assistance Where Assessed - Lower Body Bathing: Supported sitting Upper Body Dressing: Minimal assistance Where Assessed - Upper Body Dressing: Unsupported sitting Lower Body Dressing: +1 Total assistance Where Assessed - Lower Body Dressing: Supported sitting Toilet Transfer: Moderate assistance Toilet Transfer Method: Sit to stand Toilet Transfer Equipment: Regular height toilet Equipment Used: Rolling walker;Back brace;Gait belt Transfers/Ambulation Related to ADLs: Pt ambulating in hallway with RW and Min (A)  Pt feels Rt le is weak ADL Comments: pt educated on back precautions with ADLS and bed mobility. pt needs (A) with bed mobility to maintain precautions. pt has (A) of wife at d/c home. Pt limited by pain this session. Pt  don brace with Min (A) and good recall of application of brace.    OT Diagnosis: Generalized weakness;Acute pain  OT Problem List: Decreased strength;Decreased activity tolerance;Impaired balance (sitting and/or standing);Decreased safety awareness;Decreased knowledge of use of DME or AE;Decreased knowledge of precautions;Pain OT Treatment Interventions: Self-care/ADL training;DME and/or AE instruction;Therapeutic activities;Patient/family education;Balance training   OT Goals Acute Rehab OT Goals OT Goal Formulation: With patient/family Time For Goal Achievement: 04/08/12 Potential to Achieve Goals: Good ADL Goals Pt Will Perform Lower Body Bathing: with modified independence;Sit to stand from chair;Unsupported;with adaptive equipment ADL Goal: Lower Body Bathing - Progress: Goal set today Pt Will Perform Lower Body Dressing: with modified independence;Sit to stand from chair;Unsupported;with adaptive equipment ADL Goal: Lower Body Dressing - Progress: Goal set today Pt Will Transfer to Toilet: with modified independence;Ambulation;3-in-1 ADL Goal: Toilet Transfer - Progress: Goal set today Miscellaneous OT Goals Miscellaneous OT Goal #1: Pt will complete bed mobilty Mod I as precursor to adls and maintain back precautions OT Goal: Miscellaneous Goal #1 - Progress: Goal set today  Visit Information  Last OT Received On: 03/25/12 Assistance Needed: +1 PT/OT Co-Evaluation/Treatment: Yes    Subjective Data  Subjective: "oh she has been a huge help last night" -pt reporting wife (A) after surgery in PM hours Patient Stated Goal: to return home on Saturday   Prior Functioning     Home Living Lives With: Spouse Available Help at Discharge: Available 24 hours/day Type of Home: House Home Access: Stairs to enter [employer] of Steps: 1 Entrance Stairs-Rails: Left Home Layout: One level Bathroom Shower/Tub: Walk-in shower (build in seat, hand held shower , grab  bars) Bathroom Toilet: Handicapped height (vanity on Rt) Home Adaptive Equipment:  Walker - rolling;Straight cane;Hand-held shower hose;Grab bars in shower Prior Function Level of Independence: Independent Able to Take Stairs?: Yes Driving: Yes Vocation: Retired Musician: No difficulties Dominant Hand: Right         Vision/Perception Vision - History Baseline Vision: Wears glasses for distance only Patient Visual Report: No change from baseline   Cognition  Cognition Overall Cognitive Status: Appears within functional limits for tasks assessed/performed Arousal/Alertness: Awake/alert Orientation Level: Appears intact for tasks assessed Behavior During Session: [facility] for tasks performed    Extremity/Trunk Assessment Right Upper Extremity Assessment RUE ROM/Strength/Tone: WFL for tasks assessed RUE Sensation: WFL - Light Touch RUE Coordination: WFL - gross/fine motor Left Upper Extremity Assessment LUE ROM/Strength/Tone: WFL for tasks assessed LUE Sensation: WFL - Light Touch LUE Coordination: WFL - gross/fine motor  Trunk Assessment Trunk Assessment: Normal     Mobility Bed Mobility Bed Mobility: Rolling Left;Left Sidelying to Sit Rolling Left: 3: Mod assist Left Sidelying to Sit: 3: Mod assist;HOB flat Details for Bed Mobility Assistance: sequencing cues and faciliation for follow through of safe movement Transfers Transfers: Sit to Stand;Stand to Sit Sit to Stand: 3: Mod assist Stand to Sit: 3: Mod assist;With upper extremity assist;To chair/3-in-1 Details for Transfer Assistance: pt with mod v/c for safety with hand placment and RW use     Exercise     Balance Balance Balance Assessed: Yes Static Standing Balance Static Standing - Balance Support: Left upper extremity supported;During functional activity Static Standing - Level of Assistance: 5: Stand by assistance   End of Session OT - End of Session Activity Tolerance: Patient  tolerated treatment well Patient left: in chair;with call bell/phone within reach;with family/visitor present Nurse Communication: Mobility status;Precautions  GO     Lucile Shutters 03/25/2012, 11:03 AM Pager: [phone]

## 2012-03-25 NOTE — Progress Notes (Signed)
Spoke to Dr. Yetta Barre about when to restart patient's xarelto-he wants the patient to wait 5 days and then he will reassess.  Patient already over his tylenol limit.  Got an order for oxy IR.  Will monitor. Lance Bosch, RN

## 2012-03-26 MED ORDER — METHOCARBAMOL 500 MG PO TABS: 500.0000 mg | ORAL_TABLET | Freq: Four times a day (QID) | ORAL | Status: DC | PRN

## 2012-03-26 MED ORDER — OXYCODONE-ACETAMINOPHEN 5-325 MG PO TABS: 1.0000 | ORAL_TABLET | ORAL | Status: DC | PRN

## 2012-03-26 NOTE — Progress Notes (Signed)
PT Cancellation Note  Patient Details Name: [name] MRN: [id number] DOB: [birth year]   Cancelled Treatment:    Reason Eval/Treat Not Completed: Other (comment) (Patient being discharged and declined PT)   Vena Austria 03/26/2012, 4:47 PM

## 2012-03-26 NOTE — Progress Notes (Signed)
Discharge orders received, education completed, follow-up information and surgery care reviewed. Pt to be discharged home with wife. Arif Amendola, [place] Marie, RN

## 2012-03-26 NOTE — Progress Notes (Signed)
Occupational Therapy Treatment Patient Details Name: [name] MRN: [id number] DOB: [birth year] Today's Date: 03/26/2012    OT Assessment / Plan / Recommendation    Follow Up Recommendations  Home health OT       Equipment Recommendations  None recommended by OT          Plan Discharge plan remains appropriate    Precautions / Restrictions Precautions Precautions: Back Precaution Comments: back handout Required Braces or Orthoses: Spinal Brace Spinal Brace: Lumbar corset;Applied in sitting position       ADL  Lower Body Bathing: Performed;Minimal assistance;Other (comment) (with AE . wife will obtain AE from gift shop) Lower Body Dressing: Performed;Other (comment);Minimal assistance (wife will obtain AE. Pt instructed on use of all AE) Toilet Transfer: Performed Toilet Transfer Method: Sit to Barista: Comfort height toilet;Grab bars Toileting - Clothing Manipulation and Hygiene: Simulated;Set up Where Assessed - Toileting Clothing Manipulation and Hygiene: Standing Tub/Shower Transfer: Print production planner: Walk in shower      OT Goals ADL Goals ADL Goal: Lower Body Bathing - Progress: Progressing toward goals ADL Goal: Lower Body Dressing - Progress: Progressing toward goals ADL Goal: Toilet Transfer - Progress: Progressing toward goals  Visit Information  Last OT Received On: 03/26/12    Subjective Data  Subjective: I have decided i want to go home today      Cognition  Cognition Overall Cognitive Status: Appears within functional limits for tasks assessed/performed Arousal/Alertness: Awake/alert Orientation Level: Appears intact for tasks assessed Behavior During Session: [facility] for tasks performed    Mobility  Transfers Transfers: Sit to Stand;Stand to Sit Sit to Stand: 5: Supervision;With upper extremity assist;From bed Stand to Sit: 5: Supervision;With armrests;With upper extremity  assist;To chair/3-in-1          End of Session OT - End of Session Equipment Utilized During Treatment: Back brace Activity Tolerance: Patient tolerated treatment well Patient left: in chair;with call bell/phone within reach;with family/visitor present  GO     Rosanna Bickle, [place] 03/26/2012, 1:34 PM

## 2012-03-26 NOTE — Discharge Summary (Signed)
Physician Discharge Summary  Patient ID: [name] MRN: [id number] DOB/AGE: [birth year] 74 y.o.  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 The patient tolerated the procedure well and was taken to the recovery room and then to the floor in stable condition. The hospital course was routine. There were no complications. The wound remained clean dry and intact. Pt had appropriate back soreness. No complaints of leg pain or new N/T/W. The patient remained afebrile with stable vital signs, and tolerated a regular diet. The patient continued to increase activities, and pain was well controlled with oral pain medications.   Consults: None  Significant Diagnostic Studies:  Results for orders placed during the hospital encounter of 03/24/12  POCT I-STAT 4, (NA,K, GLUC, HGB,HCT)      Result Value Range   Sodium 139  135 - 145 mEq/L   Potassium 3.8  3.5 - 5.1 mEq/L   Glucose, Bld 117 (*) 70 - 99 mg/dL   HCT 78.2 (*) 95.6 - 21.3 %   Hemoglobin 11.9 (*) 13.0 - 17.0 g/dL    Chest 2 View  0/86/5784  *RADIOLOGY REPORT*  Clinical Data: Preoperative respiratory films.  CHEST - 2 VIEW  Comparison: Plain film chest 05/29/2011 07/26/2010.  Findings: Lungs are clear.  Heart size is normal.  No pneumothorax or pleural fluid.  IMPRESSION: No acute disease.   Original Report Authenticated By: Holley Dexter, M.D.    Dg Lumbar Spine 2-3 Views  03/24/2012  *RADIOLOGY REPORT*  Clinical Data: L3-5 posterior fusion  LUMBAR SPINE - 2-3 VIEW  Comparison: 11/17/2011  Findings: Spot fluoroscopic intraoperative views demonstrate posterior laminectomy and fusion with interbody disc spaces at L3-4 and L4-5. Alignment appears anatomic.  Expected  postoperative appearance.  IMPRESSION: Status post L3-4 and L4-5 posterior laminectomy and fusion. No acute finding by plain radiography.   Original Report Authenticated By: Judie Petit. Miles Costain, M.D.     Antibiotics:  Anti-infectives   Start     Dose/Rate Route Frequency Ordered Stop   03/24/12 2100  ceFAZolin (ANCEF) IVPB 1 g/50 mL premix     1 g 100 mL/hr over 30 Minutes Intravenous Every 8 hours 03/24/12 2001 03/25/12 0659   03/24/12 1354  bacitracin 50,000 Units in sodium chloride irrigation 0.9 % 500 mL irrigation  Status:  Discontinued       As needed 03/24/12 1354 03/24/12 1709   03/24/12 1212  bacitracin 69629 UNITS injection    Comments:  Reece Packer: cabinet override      03/24/12 1212 03/25/12 0014   03/24/12 0600  ceFAZolin (ANCEF) IVPB 2 g/50 mL premix     2 g 100 mL/hr over 30 Minutes Intravenous On call to O.R. 03/23/12 1430 03/24/12 1301      Discharge Exam: Blood pressure 124/67, pulse 68, temperature 97.7 F (36.5 C), temperature source Oral, resp. rate 18, height 5\' 11"  (1.803 m), weight 91.4 kg (201 lb 8 oz), SpO2 96.00%. Neurologic: Grossly normal Incision clean dry and intact Good strength  Discharge Medications:     Medication List    TAKE these medications       acetaminophen 500 MG tablet  Commonly known as:  TYLENOL  Take 500 mg by mouth every 6 (six) hours as needed. For headache/pain     amLODipine  5 MG tablet  Commonly known as:  NORVASC  Take 5 mg by mouth daily.     aspirin EC 81 MG tablet  Take 81 mg by mouth daily.     cholecalciferol 1000 UNITS tablet  Commonly known as:  VITAMIN D  Take 1,000 Units by mouth daily.     DEXILANT 60 MG capsule  Generic drug:  dexlansoprazole  Take 60 mg by mouth daily.     Fish Oil 300 MG Caps  Take 1 capsule by mouth daily.     methocarbamol 500 MG tablet  Commonly known as:  ROBAXIN  Take 1 tablet (500 mg total) by mouth every 6 (six) hours as needed.     metoprolol tartrate 25 MG tablet  Commonly  known as:  LOPRESSOR  Take 25 mg by mouth 2 (two) times daily.     oxyCODONE-acetaminophen 5-325 MG per tablet  Commonly known as:  PERCOCET/ROXICET  Take 1-2 tablets by mouth every 4 (four) hours as needed.     Rivaroxaban 20 MG Tabs  Commonly known as:  XARELTO  Take 20 mg by mouth daily.     simvastatin 20 MG tablet  Commonly known as:  ZOCOR  Take 20 mg by mouth every evening.        Disposition: Home   Final Dx: PLIF L3-4 L4-5      Discharge Orders   Future Orders Complete By Expires     Call MD for:  difficulty breathing, headache or visual disturbances  As directed     Call MD for:  persistant nausea and vomiting  As directed     Call MD for:  redness, tenderness, or signs of infection (pain, swelling, redness, odor or green/yellow discharge around incision site)  As directed     Call MD for:  severe uncontrolled pain  As directed     Call MD for:  temperature >100.4  As directed     Diet - low sodium heart healthy  As directed     Discharge instructions  As directed     Comments:      No strenuous activity. No lifting more than 10 pounds. No bending or twisting. May shower normally.    Driving Restrictions  As directed     Comments:      One week    Increase activity slowly  As directed     No wound care  As directed        Follow-up Information   Follow up with Ayzia Day S, MD. Schedule an appointment as soon as possible for a visit in 2 weeks.   Contact information:   [address] [place] [address] [phone]        Signed: Tia Alert 03/26/2012, 4:09 PM

## 2012-03-26 NOTE — Progress Notes (Signed)
Patient ID: [name], male   DOB: 04/18/1940, 72 y.o.   MRN: [id number] Pt doing well. Back pain improved. No leg pain. Good strength. Home when weather permits.

## 2012-07-02 ENCOUNTER — Other Ambulatory Visit: Payer: Self-pay | Admitting: Neurological Surgery

## 2012-07-02 DIAGNOSIS — M25551 Pain in right hip: Secondary | ICD-10-CM

## 2012-07-10 ENCOUNTER — Ambulatory Visit
Admission: RE | Admit: 2012-07-10 | Discharge: 2012-07-10 | Disposition: A | Discharge status: Home / Self Care | Payer: Medicare Other | Source: Ambulatory Visit | Attending: Neurological Surgery | Admitting: Neurological Surgery

## 2012-07-10 DIAGNOSIS — M25551 Pain in right hip: Secondary | ICD-10-CM

## 2012-07-22 ENCOUNTER — Other Ambulatory Visit: Payer: Self-pay | Admitting: Neurological Surgery

## 2012-07-22 DIAGNOSIS — M549 Dorsalgia, unspecified: Secondary | ICD-10-CM

## 2012-08-02 ENCOUNTER — Ambulatory Visit
Admission: RE | Admit: 2012-08-02 | Discharge: 2012-08-02 | Disposition: A | Discharge status: Home / Self Care | Payer: 59 | Source: Ambulatory Visit | Attending: Neurological Surgery | Admitting: Neurological Surgery

## 2012-08-02 DIAGNOSIS — M549 Dorsalgia, unspecified: Secondary | ICD-10-CM

## 2012-08-17 DIAGNOSIS — M549 Dorsalgia, unspecified: Secondary | ICD-10-CM

## 2012-08-17 HISTORY — DX: Dorsalgia, unspecified: M54.9

## 2012-08-30 HISTORY — PX: COLONOSCOPY W/ POLYPECTOMY: SHX1380

## 2013-02-18 ENCOUNTER — Other Ambulatory Visit: Payer: Self-pay | Admitting: Neurological Surgery

## 2013-02-18 DIAGNOSIS — M549 Dorsalgia, unspecified: Secondary | ICD-10-CM

## 2013-02-21 ENCOUNTER — Ambulatory Visit
Admission: RE | Admit: 2013-02-21 | Discharge: 2013-02-21 | Disposition: A | Discharge status: Home / Self Care | Payer: Medicare Other | Source: Ambulatory Visit | Attending: Neurological Surgery | Admitting: Neurological Surgery

## 2013-02-21 VITALS — BP 155/79 | HR 61

## 2013-02-21 DIAGNOSIS — M549 Dorsalgia, unspecified: Secondary | ICD-10-CM

## 2013-02-21 MED ORDER — IOHEXOL 180 MG/ML  SOLN
15.0000 mL | Freq: Once | INTRAMUSCULAR | Status: AC | PRN
  Administered 2013-02-21: 15 mL via INTRATHECAL

## 2013-02-21 MED ORDER — DIAZEPAM 5 MG PO TABS
5.0000 mg | ORAL_TABLET | Freq: Once | ORAL | Status: AC
  Administered 2013-02-21: 5 mg via ORAL

## 2013-02-21 NOTE — Progress Notes (Signed)
Resting quietly without complaint in nursing station after myelogram.  Wife at bedside.  jkl

## 2013-02-21 NOTE — Discharge Instructions (Signed)

## 2013-05-30 DIAGNOSIS — M549 Dorsalgia, unspecified: Secondary | ICD-10-CM

## 2013-05-30 DIAGNOSIS — G8929 Other chronic pain: Secondary | ICD-10-CM | POA: Insufficient documentation

## 2013-05-30 HISTORY — DX: Dorsalgia, unspecified: M54.9

## 2013-05-30 HISTORY — DX: Other chronic pain: G89.29

## 2013-07-22 ENCOUNTER — Other Ambulatory Visit: Payer: Self-pay | Admitting: Neurological Surgery

## 2013-07-22 DIAGNOSIS — M549 Dorsalgia, unspecified: Secondary | ICD-10-CM

## 2013-07-25 ENCOUNTER — Ambulatory Visit
Admission: RE | Admit: 2013-07-25 | Discharge: 2013-07-25 | Disposition: A | Discharge status: Home / Self Care | Payer: Medicare Other | Source: Ambulatory Visit | Attending: Neurological Surgery | Admitting: Neurological Surgery

## 2013-07-25 DIAGNOSIS — M549 Dorsalgia, unspecified: Secondary | ICD-10-CM

## 2013-07-25 MED ORDER — IOHEXOL 180 MG/ML  SOLN
1.0000 mL | Freq: Once | INTRAMUSCULAR | Status: AC | PRN
  Administered 2013-07-25: 1 mL via EPIDURAL

## 2013-07-25 MED ORDER — METHYLPREDNISOLONE ACETATE 40 MG/ML INJ SUSP (RADIOLOG
120.0000 mg | Freq: Once | INTRAMUSCULAR | Status: AC
  Administered 2013-07-25: 120 mg via EPIDURAL

## 2013-07-25 NOTE — Discharge Instructions (Signed)

## 2013-08-29 ENCOUNTER — Other Ambulatory Visit: Payer: Self-pay | Admitting: Neurological Surgery

## 2013-08-29 DIAGNOSIS — M549 Dorsalgia, unspecified: Secondary | ICD-10-CM

## 2013-09-02 ENCOUNTER — Ambulatory Visit
Admission: RE | Admit: 2013-09-02 | Discharge: 2013-09-02 | Disposition: A | Discharge status: Home / Self Care | Payer: Medicare Other | Source: Ambulatory Visit | Attending: Neurological Surgery | Admitting: Neurological Surgery

## 2013-09-02 DIAGNOSIS — M549 Dorsalgia, unspecified: Secondary | ICD-10-CM

## 2013-09-06 ENCOUNTER — Other Ambulatory Visit: Payer: Self-pay | Admitting: Neurological Surgery

## 2013-09-06 DIAGNOSIS — S32009K Unspecified fracture of unspecified lumbar vertebra, subsequent encounter for fracture with nonunion: Secondary | ICD-10-CM

## 2013-09-06 HISTORY — DX: Unspecified fracture of unspecified lumbar vertebra, subsequent encounter for fracture with nonunion: S32.009K

## 2013-09-16 ENCOUNTER — Encounter (HOSPITAL_COMMUNITY): Payer: Self-pay | Admitting: Pharmacy Technician

## 2013-09-20 NOTE — Pre-Procedure Instructions (Signed)
[name]  09/20/2013   Your procedure is scheduled on:  Thursday, August 20.  Report to [facility] Admitting at 11:50 AM.  Call this number if you have problems the morning of surgery: [phone]   Remember:   Do not eat food or drink liquids after midnight Wednesday August 19.    Take these medicines the morning of surgery with A SIP OF WATER: amLODipine (NORVASC), dexlansoprazole (DEXILANT), metoprolol tartrate (LOPRESSOR).  May take acetaminophen (TYLENOL) if needed.              Stop taking Aspirin, Vitamins and Fish Oil.   Do not wear jewelry, make-up or nail polish.  Do not wear lotions, powders, or perfumes.   Men may shave neck and face the morning of surgery.  Do not bring valuables to the hospital.               [facility] is not responsible for any belongings or valuables.               Contacts, dentures or bridgework may not be worn into surgery.  Leave suitcase in the car. After surgery it may be brought to your room.  For patients admitted to the hospital, discharge time is determined by your  treatment team.               Patients discharged the day of surgery will not be allowed to drive home.  Name and phone number of your driver: -   Special Instructions: Review  [place] - Preparing For Surgery.   Please read over the following fact sheets that you were given: Pain Booklet, Coughing and Deep Breathing, Blood Transfusion Information and Surgical Site Infection Prevention

## 2013-09-21 ENCOUNTER — Encounter (HOSPITAL_COMMUNITY): Payer: Self-pay

## 2013-09-21 ENCOUNTER — Encounter (HOSPITAL_COMMUNITY)
Admission: RE | Admit: 2013-09-21 | Discharge: 2013-09-21 | Disposition: A | Discharge status: Home / Self Care | Payer: Medicare Other | Source: Ambulatory Visit | Attending: Neurological Surgery | Admitting: Neurological Surgery

## 2013-09-21 DIAGNOSIS — Z01812 Encounter for preprocedural laboratory examination: Secondary | ICD-10-CM | POA: Diagnosis not present

## 2013-09-21 HISTORY — DX: Other specified postprocedural states: R11.2

## 2013-09-21 HISTORY — DX: Other specified postprocedural states: Z98.890

## 2013-09-21 LAB — TYPE AND SCREEN
ABO/RH(D): O POS
Antibody Screen: NEGATIVE

## 2013-09-21 LAB — SURGICAL PCR SCREEN
MRSA, PCR: NEGATIVE
Staphylococcus aureus: NEGATIVE

## 2013-09-21 NOTE — Progress Notes (Signed)
Pt. Had labs drawn in Dr.'s office 09-16-2013 ,labs placed in chart.  EKG and CXR also in in chart.

## 2013-09-21 NOTE — Progress Notes (Signed)
STOP BANG Tool faxed to Dr. Rochel Brome @ [place] ,[facility]

## 2013-09-28 MED ORDER — CEFAZOLIN SODIUM-DEXTROSE 2-3 GM-% IV SOLR
2.0000 g | INTRAVENOUS | Status: AC
  Administered 2013-09-29: 2 g via INTRAVENOUS
  Filled 2013-09-28: qty 50

## 2013-09-28 MED ORDER — DEXAMETHASONE SODIUM PHOSPHATE 10 MG/ML IJ SOLN
10.0000 mg | INTRAMUSCULAR | Status: AC
  Administered 2013-09-29: 10 mg via INTRAVENOUS
  Filled 2013-09-28: qty 1

## 2013-09-29 ENCOUNTER — Inpatient Hospital Stay (HOSPITAL_COMMUNITY): Payer: Medicare Other | Admitting: Anesthesiology

## 2013-09-29 ENCOUNTER — Encounter (HOSPITAL_COMMUNITY): Payer: Medicare Other | Admitting: Anesthesiology

## 2013-09-29 ENCOUNTER — Inpatient Hospital Stay (HOSPITAL_COMMUNITY)
Admission: RE | Admit: 2013-09-29 | Discharge: 2013-09-30 | DRG: 460 | Disposition: A | Discharge status: Home / Self Care | Payer: Medicare Other | Source: Ambulatory Visit | Attending: Neurological Surgery | Admitting: Neurological Surgery

## 2013-09-29 ENCOUNTER — Encounter (HOSPITAL_COMMUNITY)
Admission: RE | Disposition: A | Discharge status: Home / Self Care | Payer: Self-pay | Source: Ambulatory Visit | Attending: Neurological Surgery

## 2013-09-29 ENCOUNTER — Encounter (HOSPITAL_COMMUNITY): Payer: Self-pay | Admitting: Neurological Surgery

## 2013-09-29 DIAGNOSIS — T84498A Other mechanical complication of other internal orthopedic devices, implants and grafts, initial encounter: Principal | ICD-10-CM | POA: Diagnosis present

## 2013-09-29 DIAGNOSIS — Y831 Surgical operation with implant of artificial internal device as the cause of abnormal reaction of the patient, or of later complication, without mention of misadventure at the time of the procedure: Secondary | ICD-10-CM | POA: Diagnosis present

## 2013-09-29 DIAGNOSIS — E785 Hyperlipidemia, unspecified: Secondary | ICD-10-CM | POA: Diagnosis present

## 2013-09-29 DIAGNOSIS — R110 Nausea: Secondary | ICD-10-CM | POA: Diagnosis not present

## 2013-09-29 DIAGNOSIS — Z9849 Cataract extraction status, unspecified eye: Secondary | ICD-10-CM | POA: Diagnosis not present

## 2013-09-29 DIAGNOSIS — Z9089 Acquired absence of other organs: Secondary | ICD-10-CM | POA: Diagnosis not present

## 2013-09-29 DIAGNOSIS — Z888 Allergy status to other drugs, medicaments and biological substances status: Secondary | ICD-10-CM | POA: Diagnosis not present

## 2013-09-29 DIAGNOSIS — Z87442 Personal history of urinary calculi: Secondary | ICD-10-CM | POA: Diagnosis not present

## 2013-09-29 DIAGNOSIS — Z79899 Other long term (current) drug therapy: Secondary | ICD-10-CM | POA: Diagnosis not present

## 2013-09-29 DIAGNOSIS — K219 Gastro-esophageal reflux disease without esophagitis: Secondary | ICD-10-CM | POA: Diagnosis not present

## 2013-09-29 DIAGNOSIS — Z87891 Personal history of nicotine dependence: Secondary | ICD-10-CM

## 2013-09-29 DIAGNOSIS — I4891 Unspecified atrial fibrillation: Secondary | ICD-10-CM | POA: Diagnosis not present

## 2013-09-29 DIAGNOSIS — R066 Hiccough: Secondary | ICD-10-CM | POA: Diagnosis not present

## 2013-09-29 DIAGNOSIS — Z7982 Long term (current) use of aspirin: Secondary | ICD-10-CM | POA: Diagnosis not present

## 2013-09-29 DIAGNOSIS — I10 Essential (primary) hypertension: Secondary | ICD-10-CM | POA: Diagnosis not present

## 2013-09-29 DIAGNOSIS — Z981 Arthrodesis status: Secondary | ICD-10-CM

## 2013-09-29 HISTORY — DX: Arthrodesis status: Z98.1

## 2013-09-29 SURGERY — POSTERIOR LUMBAR FUSION 1 LEVEL
Anesthesia: General | Site: Back

## 2013-09-29 MED ORDER — EPHEDRINE SULFATE 50 MG/ML IJ SOLN
INTRAMUSCULAR | Status: DC | PRN
  Administered 2013-09-29: 5 mg via INTRAVENOUS
  Administered 2013-09-29: 10 mg via INTRAVENOUS
  Administered 2013-09-29: 5 mg via INTRAVENOUS
  Administered 2013-09-29 (×2): 10 mg via INTRAVENOUS
  Administered 2013-09-29: 5 mg via INTRAVENOUS

## 2013-09-29 MED ORDER — MORPHINE SULFATE 2 MG/ML IJ SOLN: 1.0000 mg | INTRAMUSCULAR | Status: DC | PRN

## 2013-09-29 MED ORDER — METHOCARBAMOL 500 MG PO TABS
500.0000 mg | ORAL_TABLET | Freq: Four times a day (QID) | ORAL | Status: DC | PRN
  Administered 2013-09-29 – 2013-09-30 (×3): 500 mg via ORAL
  Filled 2013-09-29 (×3): qty 1

## 2013-09-29 MED ORDER — ACETAMINOPHEN 325 MG PO TABS: 650.0000 mg | ORAL_TABLET | ORAL | Status: DC | PRN

## 2013-09-29 MED ORDER — THROMBIN 20000 UNITS EX SOLR
CUTANEOUS | Status: DC | PRN
  Administered 2013-09-29: 14:00:00 via TOPICAL

## 2013-09-29 MED ORDER — PROPOFOL 10 MG/ML IV BOLUS
INTRAVENOUS | Status: DC | PRN
  Administered 2013-09-29: 200 mg via INTRAVENOUS

## 2013-09-29 MED ORDER — METHOCARBAMOL 500 MG PO TABS
ORAL_TABLET | ORAL | Status: AC
Start: 2013-09-29 — End: 2013-09-30
  Filled 2013-09-29: qty 1

## 2013-09-29 MED ORDER — POTASSIUM CHLORIDE IN NACL 20-0.9 MEQ/L-% IV SOLN
INTRAVENOUS | Status: DC
  Filled 2013-09-29 (×4): qty 1000

## 2013-09-29 MED ORDER — ARTIFICIAL TEARS OP OINT
TOPICAL_OINTMENT | OPHTHALMIC | Status: DC | PRN
  Administered 2013-09-29: 1 via OPHTHALMIC

## 2013-09-29 MED ORDER — EPHEDRINE SULFATE 50 MG/ML IJ SOLN
INTRAMUSCULAR | Status: AC
  Filled 2013-09-29: qty 1

## 2013-09-29 MED ORDER — SODIUM CHLORIDE 0.9 % IV SOLN: 250.0000 mL | INTRAVENOUS | Status: DC

## 2013-09-29 MED ORDER — ONDANSETRON HCL 4 MG/2ML IJ SOLN
INTRAMUSCULAR | Status: DC | PRN
  Administered 2013-09-29: 4 mg via INTRAVENOUS

## 2013-09-29 MED ORDER — BUPIVACAINE HCL (PF) 0.25 % IJ SOLN
INTRAMUSCULAR | Status: DC | PRN
  Administered 2013-09-29: 7 mL

## 2013-09-29 MED ORDER — PROMETHAZINE HCL 25 MG/ML IJ SOLN: 6.2500 mg | INTRAMUSCULAR | Status: DC | PRN

## 2013-09-29 MED ORDER — METOPROLOL TARTRATE 25 MG PO TABS
25.0000 mg | ORAL_TABLET | Freq: Two times a day (BID) | ORAL | Status: DC
  Administered 2013-09-29 – 2013-09-30 (×2): 25 mg via ORAL
  Filled 2013-09-29 (×3): qty 1

## 2013-09-29 MED ORDER — ACETAMINOPHEN 650 MG RE SUPP
650.0000 mg | RECTAL | Status: DC | PRN
Start: 2013-09-29 — End: 2013-09-30

## 2013-09-29 MED ORDER — PHENOL 1.4 % MT LIQD: 1.0000 | OROMUCOSAL | Status: DC | PRN

## 2013-09-29 MED ORDER — MENTHOL 3 MG MT LOZG: 1.0000 | LOZENGE | OROMUCOSAL | Status: DC | PRN

## 2013-09-29 MED ORDER — ONDANSETRON HCL 4 MG/2ML IJ SOLN
INTRAMUSCULAR | Status: AC
  Filled 2013-09-29: qty 2

## 2013-09-29 MED ORDER — FENTANYL CITRATE 0.05 MG/ML IJ SOLN
INTRAMUSCULAR | Status: AC
  Filled 2013-09-29: qty 5

## 2013-09-29 MED ORDER — ROCURONIUM BROMIDE 100 MG/10ML IV SOLN
INTRAVENOUS | Status: DC | PRN
  Administered 2013-09-29: 50 mg via INTRAVENOUS

## 2013-09-29 MED ORDER — METHOCARBAMOL 1000 MG/10ML IJ SOLN
500.0000 mg | Freq: Four times a day (QID) | INTRAMUSCULAR | Status: DC | PRN
  Filled 2013-09-29: qty 5

## 2013-09-29 MED ORDER — HYDROMORPHONE HCL PF 1 MG/ML IJ SOLN
INTRAMUSCULAR | Status: AC
  Filled 2013-09-29: qty 1

## 2013-09-29 MED ORDER — SODIUM CHLORIDE 0.9 % IJ SOLN
3.0000 mL | Freq: Two times a day (BID) | INTRAMUSCULAR | Status: DC
  Administered 2013-09-30: 3 mL via INTRAVENOUS

## 2013-09-29 MED ORDER — SODIUM CHLORIDE 0.9 % IR SOLN
Status: DC | PRN
  Administered 2013-09-29: 14:00:00

## 2013-09-29 MED ORDER — LIDOCAINE HCL (CARDIAC) 20 MG/ML IV SOLN
INTRAVENOUS | Status: DC | PRN
  Administered 2013-09-29: 100 mg via INTRAVENOUS
  Administered 2013-09-29: 50 mg via INTRAVENOUS

## 2013-09-29 MED ORDER — SUCCINYLCHOLINE CHLORIDE 20 MG/ML IJ SOLN
INTRAMUSCULAR | Status: AC
  Filled 2013-09-29: qty 1

## 2013-09-29 MED ORDER — FENTANYL CITRATE 0.05 MG/ML IJ SOLN
INTRAMUSCULAR | Status: DC | PRN
  Administered 2013-09-29: 100 ug via INTRAVENOUS

## 2013-09-29 MED ORDER — HYDROMORPHONE HCL PF 1 MG/ML IJ SOLN
0.2500 mg | INTRAMUSCULAR | Status: DC | PRN
  Administered 2013-09-29 (×4): 0.5 mg via INTRAVENOUS

## 2013-09-29 MED ORDER — LIDOCAINE HCL (CARDIAC) 20 MG/ML IV SOLN
INTRAVENOUS | Status: AC
  Filled 2013-09-29: qty 5

## 2013-09-29 MED ORDER — LACTATED RINGERS IV SOLN
INTRAVENOUS | Status: DC | PRN
  Administered 2013-09-29 (×2): via INTRAVENOUS

## 2013-09-29 MED ORDER — ROCURONIUM BROMIDE 50 MG/5ML IV SOLN
INTRAVENOUS | Status: AC
  Filled 2013-09-29: qty 1

## 2013-09-29 MED ORDER — SODIUM CHLORIDE 0.9 % IJ SOLN: 3.0000 mL | INTRAMUSCULAR | Status: DC | PRN

## 2013-09-29 MED ORDER — HYDROCODONE-ACETAMINOPHEN 5-325 MG PO TABS
1.0000 | ORAL_TABLET | ORAL | Status: DC | PRN
  Administered 2013-09-29: 1 via ORAL
  Administered 2013-09-29 – 2013-09-30 (×3): 2 via ORAL
  Filled 2013-09-29: qty 2
  Filled 2013-09-29: qty 1
  Filled 2013-09-29 (×3): qty 2

## 2013-09-29 MED ORDER — ASPIRIN EC 81 MG PO TBEC
81.0000 mg | DELAYED_RELEASE_TABLET | Freq: Every day | ORAL | Status: DC
  Administered 2013-09-30: 81 mg via ORAL
  Filled 2013-09-29: qty 1

## 2013-09-29 MED ORDER — AMLODIPINE BESYLATE 5 MG PO TABS
5.0000 mg | ORAL_TABLET | Freq: Every day | ORAL | Status: DC
  Administered 2013-09-30: 5 mg via ORAL
  Filled 2013-09-29: qty 1

## 2013-09-29 MED ORDER — PROPOFOL 10 MG/ML IV BOLUS
INTRAVENOUS | Status: AC
  Filled 2013-09-29: qty 20

## 2013-09-29 MED ORDER — CEFAZOLIN SODIUM 1-5 GM-% IV SOLN
1.0000 g | Freq: Three times a day (TID) | INTRAVENOUS | Status: AC
  Administered 2013-09-29 – 2013-09-30 (×2): 1 g via INTRAVENOUS
  Filled 2013-09-29 (×2): qty 50

## 2013-09-29 MED ORDER — SODIUM CHLORIDE 0.9 % IJ SOLN
INTRAMUSCULAR | Status: AC
  Filled 2013-09-29: qty 10

## 2013-09-29 MED ORDER — ARTIFICIAL TEARS OP OINT
TOPICAL_OINTMENT | OPHTHALMIC | Status: AC
  Filled 2013-09-29: qty 3.5

## 2013-09-29 MED ORDER — ONDANSETRON HCL 4 MG/2ML IJ SOLN
4.0000 mg | INTRAMUSCULAR | Status: DC | PRN
  Administered 2013-09-29 – 2013-09-30 (×2): 4 mg via INTRAVENOUS
  Filled 2013-09-29 (×4): qty 2

## 2013-09-29 SURGICAL SUPPLY — 62 items
ADH SKN CLS APL DERMABOND .7 (GAUZE/BANDAGES/DRESSINGS) ×1
APL SKNCLS STERI-STRIP NONHPOA (GAUZE/BANDAGES/DRESSINGS) ×1
BAG DECANTER FOR FLEXI CONT (MISCELLANEOUS) ×3 IMPLANT
BENZOIN TINCTURE PRP APPL 2/3 (GAUZE/BANDAGES/DRESSINGS) ×3 IMPLANT
BLADE SURG ROTATE 9660 (MISCELLANEOUS) IMPLANT
BONE MATRIX OSTEOCEL PRO MED (Bone Implant) ×2 IMPLANT
BUR MATCHSTICK NEURO 3.0 LAGG (BURR) ×3 IMPLANT
CANISTER SUCT 3000ML (MISCELLANEOUS) ×3 IMPLANT
CLOSURE WOUND 1/2 X4 (GAUZE/BANDAGES/DRESSINGS) ×1
CONT SPEC 4OZ CLIKSEAL STRL BL (MISCELLANEOUS) ×6 IMPLANT
COVER BACK TABLE 24X17X13 BIG (DRAPES) IMPLANT
COVER TABLE BACK 60X90 (DRAPES) ×3 IMPLANT
DERMABOND ADVANCED (GAUZE/BANDAGES/DRESSINGS) ×2
DERMABOND ADVANCED .7 DNX12 (GAUZE/BANDAGES/DRESSINGS) IMPLANT
DRAPE C-ARM 42X72 X-RAY (DRAPES) ×6 IMPLANT
DRAPE LAPAROTOMY 100X72X124 (DRAPES) ×3 IMPLANT
DRAPE POUCH INSTRU U-SHP 10X18 (DRAPES) ×3 IMPLANT
DRAPE SURG 17X23 STRL (DRAPES) ×3 IMPLANT
DRSG OPSITE 4X5.5 SM (GAUZE/BANDAGES/DRESSINGS) ×6 IMPLANT
DRSG OPSITE POSTOP 4X6 (GAUZE/BANDAGES/DRESSINGS) ×2 IMPLANT
DRSG TELFA 3X8 NADH (GAUZE/BANDAGES/DRESSINGS) ×3 IMPLANT
DURAPREP 26ML APPLICATOR (WOUND CARE) ×3 IMPLANT
ELECT REM PT RETURN 9FT ADLT (ELECTROSURGICAL) ×3
ELECTRODE REM PT RTRN 9FT ADLT (ELECTROSURGICAL) ×1 IMPLANT
EVACUATOR 1/8 PVC DRAIN (DRAIN) ×3 IMPLANT
GAUZE SPONGE 4X4 16PLY XRAY LF (GAUZE/BANDAGES/DRESSINGS) IMPLANT
GLOVE BIO SURGEON STRL SZ8 (GLOVE) ×6 IMPLANT
GLOVE BIOGEL PI IND STRL 7.5 (GLOVE) IMPLANT
GLOVE BIOGEL PI IND STRL 8.5 (GLOVE) IMPLANT
GLOVE BIOGEL PI INDICATOR 7.5 (GLOVE) ×2
GLOVE BIOGEL PI INDICATOR 8.5 (GLOVE) ×2
GLOVE SURG SS PI 7.0 STRL IVOR (GLOVE) ×4 IMPLANT
GOWN STRL REUS W/ TWL LRG LVL3 (GOWN DISPOSABLE) IMPLANT
GOWN STRL REUS W/ TWL XL LVL3 (GOWN DISPOSABLE) ×2 IMPLANT
GOWN STRL REUS W/TWL 2XL LVL3 (GOWN DISPOSABLE) IMPLANT
GOWN STRL REUS W/TWL LRG LVL3 (GOWN DISPOSABLE) ×3
GOWN STRL REUS W/TWL XL LVL3 (GOWN DISPOSABLE) ×6
HEMOSTAT POWDER KIT SURGIFOAM (HEMOSTASIS) IMPLANT
KIT BASIN OR (CUSTOM PROCEDURE TRAY) ×3 IMPLANT
KIT ROOM TURNOVER OR (KITS) ×3 IMPLANT
MILL MEDIUM DISP (BLADE) IMPLANT
NDL HYPO 25X1 1.5 SAFETY (NEEDLE) ×1 IMPLANT
NEEDLE HYPO 25X1 1.5 SAFETY (NEEDLE) ×3 IMPLANT
NS IRRIG 1000ML POUR BTL (IV SOLUTION) ×3 IMPLANT
PACK FOAM VITOSS 5CC (Orthopedic Implant) ×2 IMPLANT
PACK LAMINECTOMY NEURO (CUSTOM PROCEDURE TRAY) ×3 IMPLANT
PAD ARMBOARD 7.5X6 YLW CONV (MISCELLANEOUS) ×9 IMPLANT
PAD DRESSING TELFA 3X8 NADH (GAUZE/BANDAGES/DRESSINGS) ×1 IMPLANT
SHEET CONFORM 45LX20WX7H (Bone Implant) ×2 IMPLANT
SPONGE LAP 4X18 X RAY DECT (DISPOSABLE) IMPLANT
SPONGE SURGIFOAM ABS GEL 100 (HEMOSTASIS) ×3 IMPLANT
STRIP CLOSURE SKIN 1/2X4 (GAUZE/BANDAGES/DRESSINGS) ×3 IMPLANT
SUT VIC AB 0 CT1 18XCR BRD8 (SUTURE) ×1 IMPLANT
SUT VIC AB 0 CT1 8-18 (SUTURE) ×3
SUT VIC AB 2-0 CP2 18 (SUTURE) ×3 IMPLANT
SUT VIC AB 3-0 SH 8-18 (SUTURE) ×6 IMPLANT
SYR 20ML ECCENTRIC (SYRINGE) ×3 IMPLANT
TOWEL OR 17X24 6PK STRL BLUE (TOWEL DISPOSABLE) ×3 IMPLANT
TOWEL OR 17X26 10 PK STRL BLUE (TOWEL DISPOSABLE) ×3 IMPLANT
TRAP SPECIMEN MUCOUS 40CC (MISCELLANEOUS) IMPLANT
TRAY FOLEY CATH 14FRSI W/METER (CATHETERS) ×3 IMPLANT
WATER STERILE IRR 1000ML POUR (IV SOLUTION) ×3 IMPLANT

## 2013-09-29 NOTE — Anesthesia Procedure Notes (Signed)
Procedure Name: Intubation Date/Time: 09/29/2013 12:20 PM Performed by: Sampson Si E Pre-anesthesia Checklist: Patient identified, Emergency Drugs available, Suction available and Patient being monitored Patient Re-evaluated:Patient Re-evaluated prior to inductionOxygen Delivery Method: Circle system utilized Preoxygenation: Pre-oxygenation with 100% oxygen Intubation Type: IV induction Ventilation: Mask ventilation without difficulty and Oral airway inserted - appropriate to patient size Laryngoscope Size: Mac and 3 Grade View: Grade I Tube type: Oral Tube size: 7.5 mm Number of attempts: 1 Airway Equipment and Method: Stylet Placement Confirmation: ETT inserted through vocal cords under direct vision,  positive ETCO2 and breath sounds checked- equal and bilateral Secured at: 22 cm Tube secured with: Tape Dental Injury: Teeth and Oropharynx as per pre-operative assessment

## 2013-09-29 NOTE — H&P (Signed)
Subjective: Patient is a 73 y.o. 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 Onset of symptoms was several months ago, gradually worsening since that time.  The pain is rated severe, and is located at the across the lower back and radiates to legs. The pain is described as aching and occurs all day. The symptoms have been progressive. Symptoms are exacerbated by exercise. 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   Past Medical History  Diagnosis Date  . Hypertension   . Dysrhythmia   . Atrial fibrillation   . GERD (gastroesophageal reflux disease)   . Kidney stone     x 1 passed kidney stone  . Hyperlipemia   . Arthritis   . PONV (postoperative nausea and vomiting)     Past Surgical History  Procedure Laterality Date  . Foot surgery      left heel  . Hernia repair    . Cholecystectomy    . Shoulder surgery      bilateral  . Colonoscopy w/ polypectomy    . Eye surgery      cataract removal bilateral eyes  . Cardiac catheterization      no PCI  . Back surgery    . Anal fissures      Prior to Admission medications   Medication Sig Start Date End Date Taking? Authorizing Provider  acetaminophen (TYLENOL) 500 MG tablet Take 500 mg by mouth every 6 (six) hours as needed for mild pain or headache. For headache/pain   Yes Historical Provider, MD  amLODipine (NORVASC) 5 MG tablet Take 5 mg by mouth daily.   Yes Historical Provider, MD  aspirin EC 81 MG tablet Take 81 mg by mouth daily.   Yes Historical Provider, MD  cholecalciferol (VITAMIN D) 1000 UNITS tablet Take 1,000 Units by mouth daily.   Yes Historical Provider, MD  dexlansoprazole (DEXILANT) 60 MG capsule Take 60 mg by mouth daily.   Yes Historical Provider, MD  Glycerin-Polysorbate 80 (REFRESH DRY EYE THERAPY) 1-1 % SOLN Place 1-2 drops into both eyes 2 (two) times daily as needed (for dry eyes).   Yes Historical Provider, MD  metoprolol tartrate (LOPRESSOR) 25 MG tablet Take 25 mg by mouth 2 (two) times  daily.   Yes Historical Provider, MD  Omega-3 Fatty Acids (FISH OIL) 300 MG CAPS Take 1 capsule by mouth daily.   Yes Historical Provider, MD   Allergies  Allergen Reactions  . Percocet [Oxycodone-Acetaminophen]     Rectal bleeding    History  Substance Use Topics  . Smoking status: Former Smoker -- 0.50 packs/day for 35 years    Types: Cigarettes    Quit date: 02/10/1993  . Smokeless tobacco: Former Systems developer    Types: Chew    Quit date: 02/10/2006  . Alcohol Use: No    No family history on file.   Review of Systems  Positive ROS: neg  All other systems have been reviewed and were otherwise negative with the exception of those mentioned in the HPI and as above.  Objective: Vital signs in last 24 hours:    General Appearance: Alert, cooperative, no distress, appears stated age Head: Normocephalic, without obvious abnormality, atraumatic Eyes: PERRL, conjunctiva/corneas clear, EOM's intact    Neck: Supple, symmetrical, trachea midline Back: Symmetric, no curvature, ROM normal, no CVA tenderness Lungs:  respirations unlabored Heart: Regular rate and rhythm Abdomen: Soft, non-tender Extremities: Extremities normal, atraumatic, no cyanosis or edema Pulses: 2+ and symmetric all extremities Skin: Skin color, texture, turgor normal,  no rashes or lesions  NEUROLOGIC:   Mental status: Alert and oriented x4,  no aphasia, good attention span, fund of knowledge, and memory Motor Exam - grossly normal Sensory Exam - grossly normal Reflexes: 1+ Coordination - grossly normal Gait - grossly normal Balance - grossly normal Cranial Nerves: I: smell Not tested  II: visual acuity  OS: nl    OD: nl  II: visual fields Full to confrontation  II: pupils Equal, round, reactive to light  III,VII: ptosis None  III,IV,VI: extraocular muscles  Full ROM  V: mastication Normal  V: facial light touch sensation  Normal  V,VII: corneal reflex  Present  VII: facial muscle function - upper   Normal  VII: facial muscle function - lower Normal  VIII: hearing Not tested  IX: soft palate elevation  Normal  IX,X: gag reflex Present  XI: trapezius strength  5/5  XI: sternocleidomastoid strength 5/5  XI: neck flexion strength  5/5  XII: tongue strength  Normal    Data Review Lab Results  Component Value Date   WBC 7.1 03/11/2012   HGB 11.9* 03/24/2012   HCT 35.0* 03/24/2012   MCV 88.1 03/11/2012   PLT 188 03/11/2012   Lab Results  Component Value Date   NA 139 03/24/2012   K 3.8 03/24/2012   CL 106 03/11/2012   CO2 27 03/11/2012   BUN 16 03/11/2012   CREATININE 0.85 03/11/2012   GLUCOSE 117* 03/24/2012   Lab Results  Component Value Date   INR 1.29 03/11/2012    Assessment/Plan: Patient admitted for redo fusion L4-5 for pseudoarthrosis. Patient has failed a reasonable attempt at conservative therapy.  I explained the condition and procedure to the patient and answered any questions.  Patient wishes to proceed with procedure as planned. Understands risks/ benefits and typical outcomes of procedure.   [name] S 09/29/2013 11:09 AM

## 2013-09-29 NOTE — Anesthesia Postprocedure Evaluation (Signed)
Anesthesia Post Note  Patient: [name]  Procedure(s) Performed: Procedure(s) (LRB): Lumbar Four-Five Re-insertion of bone graft (N/A)  Anesthesia type: general  Patient location: PACU  Post pain: Pain level controlled  Post assessment: Patient's Cardiovascular Status Stable  Last Vitals:  Filed Vitals:   09/29/13 1510  BP: 145/73  Pulse: 67  Temp:   Resp: 17    Post vital signs: Reviewed and stable  Level of consciousness: sedated  Complications: No apparent anesthesia complications

## 2013-09-29 NOTE — Anesthesia Preprocedure Evaluation (Addendum)
Anesthesia Evaluation  Patient identified by MRN, date of birth, ID band  Reviewed: Allergy & Precautions, H&P , NPO status   History of Anesthesia Complications (+) PONV  Airway Mallampati: II  Neck ROM: Full    Dental  (+) Teeth Intact, Partial Lower, Dental Advisory Given   Pulmonary former smoker,  breath sounds clear to auscultation        Cardiovascular hypertension, + dysrhythmias Atrial Fibrillation Rhythm:Regular Rate:Normal     Neuro/Psych    GI/Hepatic GERD-  ,  Endo/Other    Renal/GU      Musculoskeletal  (+) Arthritis -,   Abdominal   Peds  Hematology   Anesthesia Other Findings   Reproductive/Obstetrics                          Anesthesia Physical Anesthesia Plan  ASA: III  Anesthesia Plan: General   Post-op Pain Management:    Induction: Intravenous  Airway Management Planned: Oral ETT  Additional Equipment:   Intra-op Plan:   Post-operative Plan: Extubation in OR  Informed Consent: I have reviewed the patients History and Physical, chart, labs and discussed the procedure including the risks, benefits and alternatives for the proposed anesthesia with the patient or authorized representative who has indicated his/her understanding and acceptance.   Dental advisory given  Plan Discussed with:   Anesthesia Plan Comments:         Anesthesia Quick Evaluation

## 2013-09-29 NOTE — Progress Notes (Signed)
On hold for 3c to call back for report

## 2013-09-29 NOTE — Op Note (Signed)
09/29/2013  2:49 PM  PATIENT:  [name]  73 y.o. 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 Vertell Limber  ANESTHESIA:   General  EBL: 50 ml  Total I/O In: 1500 [I.V.:1500] Out: 210 [Urine:160; Blood:50]  BLOOD ADMINISTERED:none  DRAINS: None   SPECIMEN:  No Specimen  INDICATION FOR PROCEDURE: This patient underwent a lumbar fusion a couple of years ago. He presented with progressive back and leg pain. CT scan sewed a solid fusion L3-4 but a pseudoarthrosis at L4-5. I recommended redo fusion L4-5. Patient understood the risks, benefits, and alternatives and potential outcomes and wished to proceed.  PROCEDURE DETAILS: The patient was taken to the operating room and after induction of adequate generalized endotracheal anesthesia, the patient was rolled into the prone position on the Wilson frame and all pressure points were padded. The lumbar region was cleaned and then prepped with DuraPrep and draped in the usual sterile fashion. 5 cc of local anesthesia was injected and then a dorsal midline incision was made through the old incision and was carried down to the lumbo sacral fascia. The fascia was opened and the paraspinous musculature was taken down in a subperiosteal fashion to expose the previous hardware at L3-4 and L4-5. I was able to dissect out over the hardware and exposed the lateral facets of L3-4 and L4-5. The transverse processes were found at L4 and L5 lateral to the tulip heads of the pedicle screws. I pulled on each pedicle screw in succession to make sure everything moved in unison. The hardware had good  purchase. I did a subcutaneous dissection to expose the right iliac crest. I opened the fascia over the iliac crest and used a Jamshidi needle to remove about 16 cc of bone marrow aspirate. I then placed Gelfoam into the hole to stop any bleeding from the iliac crest and closed the subcutaneous tissue back to the fascia appeared. This bone marrow aspirate was then soaked on a conform cancellus allograft. I then drilled the surface of each transverse process of L4 and L5 as well as the lateral facet and placed the morcellized and structural allograft out over these to perform a posterior lateral arthrodesis at L4-5. The wound was irrigated with saline solution containing bacitracin. I then closed the fascia with 0 Vicryl. I closed the subcutaneous tissues with 2-0 Vicryl and the subcuticular tissues with 3-0 Vicryl. The skin was then closed with benzoin and Steri-Strips. The drapes were removed, a sterile dressing was applied. The patient was awakened from general anesthesia and transferred to the recovery room in stable condition. At the end of the procedure all sponge, needle and instrument counts were correct.   PLAN OF CARE: Admit to inpatient   PATIENT DISPOSITION:  PACU - hemodynamically stable.   Delay start of Pharmacological VTE agent (>24hrs) due to surgical blood loss or risk of bleeding:  yes

## 2013-09-29 NOTE — Transfer of Care (Signed)
Immediate Anesthesia Transfer of Care Note  Patient: [name]  Procedure(s) Performed: Procedure(s) with comments: Lumbar Four-Five Re-insertion of bone graft (N/A) - Lumbar Four-Five Re-insertion of bone graft  Patient Location: PACU  Anesthesia Type:General  Level of Consciousness: lethargic and responds to stimulation  Airway & Oxygen Therapy: Patient Spontanous Breathing and Patient connected to face mask oxygen  Post-op Assessment: Report given to PACU RN  Post vital signs: Reviewed and stable  Complications: No apparent anesthesia complications

## 2013-09-30 MED ORDER — HYDROCODONE-ACETAMINOPHEN 5-325 MG PO TABS: 1.0000 | ORAL_TABLET | ORAL | Status: DC | PRN

## 2013-09-30 MED ORDER — CHLORPROMAZINE HCL 25 MG PO TABS
25.0000 mg | ORAL_TABLET | Freq: Three times a day (TID) | ORAL | Status: DC | PRN
  Administered 2013-09-30: 25 mg via ORAL
  Filled 2013-09-30: qty 1

## 2013-09-30 MED ORDER — METHOCARBAMOL 500 MG PO TABS: 500.0000 mg | ORAL_TABLET | Freq: Four times a day (QID) | ORAL | Status: DC | PRN

## 2013-09-30 NOTE — Progress Notes (Signed)
Pt doing well. Pt and wife given D/C instructions with Rx's, verbal understanding was provided. Pt's IV was removed prior to D/C. Pt's incision was covered with Honeycomb dressing with minimal drainage. Pt D/C'd home via wheelchair @ 1730 per MD order. Pt is stable @ D/C and has no other needs at this time. Holli Humbles, RN

## 2013-09-30 NOTE — Discharge Summary (Signed)
Physician Discharge Summary  Patient ID: [name] MRN: [id number] DOB/AGE: Aug 06, 1940 73 y.o.  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 The patient tolerated the procedure well and was taken to the recovery room and then to the floor in stable condition. The hospital course was routine. There were no complications. The wound remained clean dry and intact. Pt had appropriate back soreness. No complaints of leg pain or new N/T/W. The patient remained afebrile with stable vital signs, and tolerated a regular diet. The patient continued to increase activities, and pain was well controlled with oral pain medications.   Consults: None  Significant Diagnostic Studies:  Results for orders placed during the hospital encounter of 09/21/13  SURGICAL PCR SCREEN      Result Value Ref Range   MRSA, PCR NEGATIVE  NEGATIVE   Staphylococcus aureus NEGATIVE  NEGATIVE  TYPE AND SCREEN      Result Value Ref Range   ABO/RH(D) O POS     Antibody Screen NEG     Sample Expiration 10/05/2013      Ct Lumbar Spine Wo Contrast  09/02/2013   CLINICAL DATA:  Low back pain and right leg pain and weakness. Prior lumbar fusion from L3 to L5.  EXAM: CT LUMBAR SPINE WITHOUT CONTRAST  TECHNIQUE: Multidetector CT imaging of the lumbar spine was performed without intravenous contrast administration. Multiplanar CT image reconstructions were also generated.  COMPARISON:  CT myelogram dated 02/21/2013\  FINDINGS:  T12-L1 through L2-3: No significant abnormality. No change since the prior CT myelogram. Unchanged Schmorl's nodes in the inferior and superior endplates of L3.  L3-4 and L4-5: The interbody fusion devices are well integrated into the endplates at I4-3 and P2-9. No  loosening of the pedicle screws. Excellent posterior decompression of the spinal canal at both levels. No visible residual neural impingement. The tail is obscured by the hardware.  L5-S1:  Normal.  IMPRESSION: 1. No visible change in the appearance of the lumbar spine since the prior study of 02/21/2013. 2. No visible neural impingement. Findings consistent with solid interbody fusion at L3-4 and L4-5.   Electronically Signed   By: Rozetta Nunnery M.D.   On: 09/02/2013 12:45    Antibiotics:  Anti-infectives   Start     Dose/Rate Route Frequency Ordered Stop   09/29/13 2030  ceFAZolin (ANCEF) IVPB 1 g/50 mL premix     1 g 100 mL/hr over 30 Minutes Intravenous Every 8 hours 09/29/13 1807 09/30/13 0407   09/29/13 1334  bacitracin 50,000 Units in sodium chloride irrigation 0.9 % 500 mL irrigation  Status:  Discontinued       As needed 09/29/13 1334 09/29/13 1434   09/29/13 0600  ceFAZolin (ANCEF) IVPB 2 g/50 mL premix     2 g 100 mL/hr over 30 Minutes Intravenous On call to O.R. 09/28/13 1418 09/29/13 1230      Discharge Exam: Blood pressure 119/78, pulse 67, temperature 98.2 F (36.8 C), temperature source Oral, resp. rate 16, weight 92.534 kg (204 lb), SpO2 93.00%. Neurologic: Grossly normal incison CDI  Discharge Medications:     Medication List         acetaminophen 500 MG tablet  Commonly known as:  TYLENOL  Take 500 mg by mouth every 6 (six) hours as needed  for mild pain or headache. For headache/pain     amLODipine 5 MG tablet  Commonly known as:  NORVASC  Take 5 mg by mouth daily.     aspirin EC 81 MG tablet  Take 81 mg by mouth daily.     cholecalciferol 1000 UNITS tablet  Commonly known as:  VITAMIN D  Take 1,000 Units by mouth daily.     DEXILANT 60 MG capsule  Generic drug:  dexlansoprazole  Take 60 mg by mouth daily.     Fish Oil 300 MG Caps  Take 1 capsule by mouth daily.     HYDROcodone-acetaminophen 5-325 MG per tablet  Commonly known as:  NORCO/VICODIN   Take 1-2 tablets by mouth every 4 (four) hours as needed (mild pain).     methocarbamol 500 MG tablet  Commonly known as:  ROBAXIN  Take 1 tablet (500 mg total) by mouth every 6 (six) hours as needed for muscle spasms.     metoprolol tartrate 25 MG tablet  Commonly known as:  LOPRESSOR  Take 25 mg by mouth 2 (two) times daily.     REFRESH DRY EYE THERAPY 1-1 % Soln  Generic drug:  Glycerin-Polysorbate 80  Place 1-2 drops into both eyes 2 (two) times daily as needed (for dry eyes).        Disposition: home   Final Dx: re-do fusion L4-5      Discharge Instructions   Call MD for:  difficulty breathing, headache or visual disturbances    Complete by:  As directed      Call MD for:  persistant nausea and vomiting    Complete by:  As directed      Call MD for:  redness, tenderness, or signs of infection (pain, swelling, redness, odor or green/yellow discharge around incision site)    Complete by:  As directed      Call MD for:  severe uncontrolled pain    Complete by:  As directed      Call MD for:  temperature >100.4    Complete by:  As directed      Diet - low sodium heart healthy    Complete by:  As directed      Discharge instructions    Complete by:  As directed   No strenuous activity     Increase activity slowly    Complete by:  As directed      Remove dressing in 48 hours    Complete by:  As directed            Follow-up Information   Follow up with Lamine Laton S, MD. Schedule an appointment as soon as possible for a visit in 3 weeks.   Specialty:  Neurosurgery   Contact information:   [address] [place] [address] [phone]        Signed: Eustace Moore 09/30/2013, 4:54 PM

## 2013-09-30 NOTE — Progress Notes (Signed)
Patient ID: [name], male   DOB: 12-23-1940, 74 y.o.   MRN: [id number] Subjective: Patient reports nasusa and hiccups. Back quite sore, but no leg pain, walking and voiding well.  Objective: Vital signs in last 24 hours: Temp:  [97 F (36.1 C)-97.9 F (36.6 C)] 97.7 F (36.5 C) (08/21 0757) Pulse Rate:  [56-86] 72 (08/21 0757) Resp:  [5-56] 16 (08/21 0757) BP: (110-152)/(67-84) 129/74 mmHg (08/21 0757) SpO2:  [94 %-100 %] 94 % (08/21 0757) Weight:  [92.534 kg (204 lb)] 92.534 kg (204 lb) (08/20 1125)  Intake/Output from previous day: 08/20 0701 - 08/21 0700 In: 2250 [P.O.:250; I.V.:2000] Out: 512 [Urine:460; Emesis/NG output:2; Blood:50] Intake/Output this shift: Total I/O In: 480 [P.O.:480] Out: -   Neurologic: Grossly normal  Lab Results: Lab Results  Component Value Date   WBC 7.1 03/11/2012   HGB 11.9* 03/24/2012   HCT 35.0* 03/24/2012   MCV 88.1 03/11/2012   PLT 188 03/11/2012   Lab Results  Component Value Date   INR 1.29 03/11/2012   BMET Lab Results  Component Value Date   NA 139 03/24/2012   K 3.8 03/24/2012   CL 106 03/11/2012   CO2 27 03/11/2012   GLUCOSE 117* 03/24/2012   BUN 16 03/11/2012   CREATININE 0.85 03/11/2012   CALCIUM 9.7 03/11/2012    Studies/Results: No results found.  Assessment/Plan: Doing well save the nausea. Home tomorrow.   LOS: 1 day    JONES,DAVID S 09/30/2013, 9:55 AM

## 2013-09-30 NOTE — Discharge Instructions (Signed)
Wound Care °Keep incision covered and dry for one week.  If you shower prior to then, cover incision with plastic wrap.  °You may remove outer bandage after one week and shower.  °Do not put any creams, lotions, or ointments on incision. °Leave steri-strips on neck.  They will fall off by themselves. °Activity °Walk each and every day, increasing distance each day. °No lifting greater than 5 lbs.  Avoid bending, arching, or twisting. °No driving for 2 weeks; may ride as a passenger locally. °If provided with back brace, wear when out of bed.  It is not necessary to wear in bed. °Diet °Resume your normal diet.  °Return to Work °Will be discussed at you follow up appointment. °Call Your Doctor If Any of These Occur °Redness, drainage, or swelling at the wound.  °Temperature greater than 101 degrees. °Severe pain not relieved by pain medication. °Incision starts to come apart. °Follow Up Appt °Call today for appointment in 1-2 weeks ([phone]) or for problems.  If you have any hardware placed in your spine, you will need an x-ray before your appointment. °

## 2014-02-20 DIAGNOSIS — M545 Low back pain, unspecified: Secondary | ICD-10-CM

## 2014-02-20 DIAGNOSIS — G8929 Other chronic pain: Secondary | ICD-10-CM | POA: Insufficient documentation

## 2014-02-20 HISTORY — DX: Low back pain, unspecified: M54.50

## 2014-02-20 HISTORY — DX: Other chronic pain: G89.29

## 2015-07-03 ENCOUNTER — Other Ambulatory Visit: Payer: Self-pay | Admitting: Neurological Surgery

## 2015-07-03 DIAGNOSIS — R519 Headache, unspecified: Secondary | ICD-10-CM | POA: Insufficient documentation

## 2015-07-03 DIAGNOSIS — M542 Cervicalgia: Secondary | ICD-10-CM

## 2015-07-03 HISTORY — DX: Cervicalgia: M54.2

## 2015-07-05 ENCOUNTER — Ambulatory Visit
Admission: RE | Admit: 2015-07-05 | Discharge: 2015-07-05 | Disposition: A | Discharge status: Home / Self Care | Payer: Medicare Other | Source: Ambulatory Visit | Attending: Neurological Surgery | Admitting: Neurological Surgery

## 2015-07-05 DIAGNOSIS — M542 Cervicalgia: Secondary | ICD-10-CM

## 2015-09-24 DIAGNOSIS — M47812 Spondylosis without myelopathy or radiculopathy, cervical region: Secondary | ICD-10-CM

## 2015-09-24 HISTORY — DX: Spondylosis without myelopathy or radiculopathy, cervical region: M47.812

## 2015-10-12 DIAGNOSIS — C801 Malignant (primary) neoplasm, unspecified: Secondary | ICD-10-CM | POA: Insufficient documentation

## 2015-10-12 HISTORY — DX: Malignant (primary) neoplasm, unspecified: C80.1

## 2016-06-17 DIAGNOSIS — I10 Essential (primary) hypertension: Secondary | ICD-10-CM

## 2016-06-17 DIAGNOSIS — E785 Hyperlipidemia, unspecified: Secondary | ICD-10-CM | POA: Insufficient documentation

## 2016-06-17 DIAGNOSIS — I2510 Atherosclerotic heart disease of native coronary artery without angina pectoris: Secondary | ICD-10-CM

## 2016-06-17 HISTORY — DX: Hyperlipidemia, unspecified: E78.5

## 2016-06-17 HISTORY — DX: Essential (primary) hypertension: I10

## 2016-06-17 HISTORY — DX: Atherosclerotic heart disease of native coronary artery without angina pectoris: I25.10

## 2016-08-04 HISTORY — PX: ESOPHAGOGASTRODUODENOSCOPY: SHX1529

## 2017-01-23 ENCOUNTER — Observation Stay (HOSPITAL_COMMUNITY)
Admission: AD | Admit: 2017-01-23 | Discharge: 2017-01-25 | Disposition: A | Discharge status: Home / Self Care | Payer: Medicare Other | Source: Other Acute Inpatient Hospital | Attending: Internal Medicine | Admitting: Internal Medicine

## 2017-01-23 DIAGNOSIS — Z7982 Long term (current) use of aspirin: Secondary | ICD-10-CM | POA: Insufficient documentation

## 2017-01-23 DIAGNOSIS — I25110 Atherosclerotic heart disease of native coronary artery with unstable angina pectoris: Principal | ICD-10-CM | POA: Insufficient documentation

## 2017-01-23 DIAGNOSIS — I10 Essential (primary) hypertension: Secondary | ICD-10-CM | POA: Insufficient documentation

## 2017-01-23 DIAGNOSIS — I200 Unstable angina: Secondary | ICD-10-CM | POA: Diagnosis not present

## 2017-01-23 DIAGNOSIS — R0789 Other chest pain: Secondary | ICD-10-CM | POA: Diagnosis not present

## 2017-01-23 DIAGNOSIS — R079 Chest pain, unspecified: Secondary | ICD-10-CM

## 2017-01-23 DIAGNOSIS — Z79899 Other long term (current) drug therapy: Secondary | ICD-10-CM | POA: Diagnosis not present

## 2017-01-23 DIAGNOSIS — R5383 Other fatigue: Secondary | ICD-10-CM | POA: Diagnosis present

## 2017-01-23 DIAGNOSIS — Z87891 Personal history of nicotine dependence: Secondary | ICD-10-CM | POA: Diagnosis not present

## 2017-01-23 DIAGNOSIS — I480 Paroxysmal atrial fibrillation: Secondary | ICD-10-CM | POA: Diagnosis not present

## 2017-01-23 MED ORDER — NITROGLYCERIN 0.4 MG SL SUBL: 0.4000 mg | SUBLINGUAL_TABLET | SUBLINGUAL | Status: DC | PRN

## 2017-01-23 MED ORDER — POLYVINYL ALCOHOL 1.4 % OP SOLN: 1.0000 [drp] | Freq: Two times a day (BID) | OPHTHALMIC | Status: DC | PRN

## 2017-01-23 MED ORDER — METHOCARBAMOL 500 MG PO TABS: 500.0000 mg | ORAL_TABLET | Freq: Four times a day (QID) | ORAL | Status: DC | PRN

## 2017-01-23 MED ORDER — ATORVASTATIN CALCIUM 40 MG PO TABS
40.0000 mg | ORAL_TABLET | Freq: Every day | ORAL | Status: DC
  Administered 2017-01-24: 40 mg via ORAL
  Filled 2017-01-23: qty 1

## 2017-01-23 MED ORDER — VITAMIN D 1000 UNITS PO TABS
1000.0000 [IU] | ORAL_TABLET | Freq: Every day | ORAL | Status: DC
  Administered 2017-01-24 – 2017-01-25 (×2): 1000 [IU] via ORAL
  Filled 2017-01-23 (×2): qty 1

## 2017-01-23 MED ORDER — AMLODIPINE BESYLATE 5 MG PO TABS
5.0000 mg | ORAL_TABLET | Freq: Every day | ORAL | Status: DC
  Administered 2017-01-24 – 2017-01-25 (×2): 5 mg via ORAL
  Filled 2017-01-23 (×2): qty 1

## 2017-01-23 MED ORDER — ASPIRIN EC 81 MG PO TBEC
81.0000 mg | DELAYED_RELEASE_TABLET | Freq: Every day | ORAL | Status: DC
  Administered 2017-01-24 – 2017-01-25 (×2): 81 mg via ORAL
  Filled 2017-01-23 (×2): qty 1

## 2017-01-23 MED ORDER — ONDANSETRON HCL 4 MG/2ML IJ SOLN: 4.0000 mg | Freq: Four times a day (QID) | INTRAMUSCULAR | Status: DC | PRN

## 2017-01-23 MED ORDER — HYDROCODONE-ACETAMINOPHEN 5-325 MG PO TABS: 1.0000 | ORAL_TABLET | ORAL | Status: DC | PRN

## 2017-01-23 MED ORDER — PANTOPRAZOLE SODIUM 40 MG PO TBEC
40.0000 mg | DELAYED_RELEASE_TABLET | Freq: Every day | ORAL | Status: DC
  Administered 2017-01-24 – 2017-01-25 (×2): 40 mg via ORAL
  Filled 2017-01-23 (×2): qty 1

## 2017-01-23 MED ORDER — ASPIRIN EC 81 MG PO TBEC: 81.0000 mg | DELAYED_RELEASE_TABLET | Freq: Every day | ORAL | Status: DC

## 2017-01-23 MED ORDER — ACETAMINOPHEN 500 MG PO TABS: 500.0000 mg | ORAL_TABLET | Freq: Four times a day (QID) | ORAL | Status: DC | PRN

## 2017-01-23 MED ORDER — OMEGA-3-ACID ETHYL ESTERS 1 G PO CAPS
1.0000 g | ORAL_CAPSULE | Freq: Every day | ORAL | Status: DC
  Administered 2017-01-24 – 2017-01-25 (×2): 1 g via ORAL
  Filled 2017-01-23 (×2): qty 1

## 2017-01-23 MED ORDER — METOPROLOL TARTRATE 25 MG PO TABS
25.0000 mg | ORAL_TABLET | Freq: Two times a day (BID) | ORAL | Status: DC
  Administered 2017-01-23 – 2017-01-25 (×4): 25 mg via ORAL
  Filled 2017-01-23 (×4): qty 1

## 2017-01-23 MED ORDER — HEPARIN (PORCINE) IN NACL 100-0.45 UNIT/ML-% IJ SOLN: 1100.0000 [IU]/h | INTRAMUSCULAR | Status: DC

## 2017-01-23 NOTE — Progress Notes (Addendum)
ANTICOAGULATION CONSULT NOTE - Initial Consult  Pharmacy Consult for heparin Indication: chest pain/ACS  Allergies  Allergen Reactions  . Percocet [Oxycodone-Acetaminophen]     Rectal bleeding    Patient Measurements: Height: 5\' 11"  (180.3 cm) Weight: 200 lb 14.4 oz (91.1 kg) IBW/kg (Calculated) : 75.3  Vital Signs: Temp: 97.8 F (36.6 C) (12/14 2255) Temp Source: Oral (12/14 2255) BP: 110/71 (12/14 2255) Pulse Rate: 66 (12/14 2255)   Medical History: Past Medical History:  Diagnosis Date  . Arthritis   . Atrial fibrillation   . Dysrhythmia   . GERD (gastroesophageal reflux disease)   . Hyperlipemia   . Hypertension   . PONV (postoperative nausea and vomiting)      Assessment: 76yo male c/o CP, started on heparin at OSH and tx'd to [facility] for further evaluation.  Home meds: ASA, Norvasc, MVI, fish oil, vit D, finasteride, Protonix, Lipitor, Flomax  Labs from OSH: SCr 0.8, WBC 6, Hgb 16.2, Plt 174, PTT 25, INR 1, trop <0.01  Goal of Therapy:  Heparin level 0.3-0.7 units/ml Monitor platelets by anticoagulation protocol: Yes   Plan:  At OSH given heparin bolus of 4000 units followed by gtt at 1100 units/hr; will continue for now and monitor heparin levels and CBC.  Wynona Neat, PharmD, BCPS  01/23/2017,11:47 PM   ADDENDUM: Initial heparin level at goal (0.35).  Will continue heparin gtt and confirm with additional level.   VB 01/24/2017 7:13 AM

## 2017-01-23 NOTE — H&P (Signed)
Patient ID: [name] MRN: [id number], DOB/AGE: 78-02-1940   Admit date: 01/23/2017   Primary Physician: No primary care provider on file. Primary Cardiologist: Dr. Geraldo Pitter   Pt. 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 Arthritis   . Atrial fibrillation   . Dysrhythmia   . GERD (gastroesophageal reflux disease)   . Hyperlipemia   . Hypertension   . PONV (postoperative nausea and vomiting)     Past Surgical History:  Procedure Laterality Date  . anal fissures    . BACK SURGERY    . CARDIAC CATHETERIZATION     no PCI  . CHOLECYSTECTOMY    . COLONOSCOPY W/ POLYPECTOMY    . EYE SURGERY     cataract removal bilateral eyes  . FOOT SURGERY     left heel  . HERNIA REPAIR    . SHOULDER SURGERY     bilateral     Allergies  Allergies  Allergen Reactions  . 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 He had come back from the school which is 1/2 block when he was at home and developed sudden onset of fatigue, weakness and clammy feeling. He drove himself to the school to get evaluated by the nurse. His BP was ~200/100s and was sent to [facility] ED. He was tachypneic with some chest pressure/tightness on arrival. His symptoms of chest pressure/SOB resolved with SLN. He required a nitro patch to maintain control of his symptoms.   He denies any new medications or activities. His wife passed away and therefore he takes care of the house himself but stays active with work. He does have a history of atypical and typical chest pain that was managed with diagnostics and medications.   Home Medications  Prior to Admission medications   Medication Sig Start Date End Date Taking? Authorizing Provider  acetaminophen (TYLENOL) 500 MG tablet Take  500 mg by mouth every 6 (six) hours as needed for mild pain or headache. For headache/pain    [provider]  amLODipine (NORVASC) 5 MG tablet Take 5 mg by mouth daily.    [provider]  aspirin EC 81 MG tablet Take 81 mg by mouth daily.    [provider]  cholecalciferol (VITAMIN D) 1000 UNITS tablet Take 1,000 Units by mouth daily.    [provider]  dexlansoprazole (DEXILANT) 60 MG capsule Take 60 mg by mouth daily.    [provider]  Glycerin-Polysorbate 80 (REFRESH DRY EYE THERAPY) 1-1 % SOLN Place 1-2 drops into both eyes 2 (two) times daily as needed (for dry eyes).    [provider]  HYDROcodone-acetaminophen (NORCO/VICODIN) 5-325 MG per tablet Take 1-2 tablets by mouth every 4 (four) hours as needed (mild pain). 09/30/13   Eustace Moore, MD  methocarbamol (ROBAXIN) 500 MG tablet Take 1 tablet (500 mg total) by mouth every 6 (six) hours as needed for muscle spasms. 09/30/13   Eustace Moore, MD  metoprolol tartrate (LOPRESSOR) 25 MG tablet Take 25 mg by mouth 2 (two) times daily.    [provider]  Omega-3 Fatty Acids (FISH OIL) 300 MG CAPS Take 1 capsule by mouth daily.    [provider]    Family History  No  family history on file.  Social History  Social History   Socioeconomic History  . Marital status: Widowed    Spouse name: Not on file  . Number of children: Not on file  . Years of education: Not on file  . Highest education level: Not on file  Social Needs  . Financial resource strain: Not on file  . Food insecurity - worry: Not on file  . Food insecurity - inability: Not on file  . Transportation needs - medical: Not on file  . Transportation needs - non-medical: Not on file  Occupational History  . Not on file  Tobacco Use  . Smoking status: Former Smoker    Packs/day: 0.50    Years: 35.00    Pack years: 17.50    Types: Cigarettes    Last attempt to quit: 02/10/1993    Years since  quitting: 23.9  . Smokeless tobacco: Former Systems developer    Types: [place] date: 02/10/2006  Substance and Sexual Activity  . Alcohol use: No  . Drug use: No  . Sexual activity: Not on file  Other Topics Concern  . Not on file  Social History Narrative  . Not on file     Review of Systems General:  No chills, fever, night sweats or weight changes.  Cardiovascular:  No chest pain currently, dyspnea on exertion, edema, orthopnea, palpitations, paroxysmal nocturnal dyspnea. Dermatological: No rash, lesions/masses Respiratory: No cough, dyspnea Urologic: No hematuria, dysuria Abdominal:   No nausea, vomiting, diarrhea, bright red blood per rectum, melena, or hematemesis Neurologic:  No visual changes, wkns, changes in mental status. All other systems reviewed and are otherwise negative except as noted above.  Physical Exam  There were no vitals taken for this visit.  General: Pleasant, NAD Psych: Normal affect. Neuro: Alert and oriented X 3. Moves all extremities spontaneously. HEENT: Normal  Neck: Supple without bruits or JVD. Lungs:  Resp regular and unlabored, CTA. Heart: RRR no s3, s4, or murmurs. Abdomen: Soft, non-tender, non-distended, BS + x 4.  Extremities: No clubbing, cyanosis or edema. DP/PT/Radials 2+ and equal bilaterally.  Labs  Troponin (Point of Care Test) No results for input(s): TROPIPOC in the last 72 hours. No results for input(s): CKTOTAL, CKMB, TROPONINI in the last 72 hours. Lab Results  Component Value Date   WBC 7.1 03/11/2012   HGB 11.9 (L) 03/24/2012   HCT 35.0 (L) 03/24/2012   MCV 88.1 03/11/2012   PLT 188 03/11/2012   No results for input(s): NA, K, CL, CO2, BUN, CREATININE, CALCIUM, PROT, BILITOT, ALKPHOS, ALT, AST, GLUCOSE in the last 168 hours.  Invalid input(s): LABALBU Lab Results  Component Value Date   CHOL  05/03/2007    121        ATP III CLASSIFICATION:  <200     mg/dL   Desirable  200-239  mg/dL   Borderline High  >=240     mg/dL   High   HDL 34 (L) 05/03/2007   LDLCALC  05/03/2007    56        Total Cholesterol/HDL:CHD Risk Coronary Heart Disease Risk Table                     Men   Women  1/2 Average Risk   3.4   3.3   TRIG 156 (H) 05/03/2007   Lab Results  Component Value Date   DDIMER  05/31/2007    0.31  AT THE INHOUSE ESTABLISHED CUTOFF VALUE OF 0.48 ug/mL FEU, THIS ASSAY HAS BEEN DOCUMENTED IN THE LITERATURE TO HAVE     Radiology/Studies  ECG No ST elevation/depression, sinus  LHC 04/2016 LMCA: Normal appearance with 0% stenosis. LAD: Focal stenosis. Lesion on Prox LAD: Mid subsection.30% stenosis 3 mm length . LCx: Normal appearance with 0% stenosis. 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  # Unstable angina # H/o CAD with risk factors (HTN, smoking history, obesity) He reports his symptoms today were worse than prior episodes of chest tightness and SOB. His BP at home is usually 120/60, therefore SBP >128 is abnormal. Certainly his symptoms could have been brought on by HTN, however given known CAD concern that this is unstable angina given the need for increasing doses of nitro - heparin gtt - ASA 81mg  (already loaded at OSH) - statin, metop - TTE and LHC on Monday. May need intervention vs improved medical management (Imdu?)  # HTN - continue amlodipine and metop  # H/o afib: sinus here. Reports history of Afib, was on xarelto but stopped after wearing event monitor (per patient) and no evidence of recurrent afib. CHADSVASC2 4 (age, HTN, CAD)  FULL CODE DVT: heparin gtt  Signed, Charlies Silvers, MD Overnight Cardiology Fellow

## 2017-01-24 ENCOUNTER — Inpatient Hospital Stay (HOSPITAL_COMMUNITY): Payer: Medicare Other

## 2017-01-24 ENCOUNTER — Other Ambulatory Visit: Payer: Self-pay

## 2017-01-24 ENCOUNTER — Encounter (HOSPITAL_COMMUNITY): Payer: Self-pay

## 2017-01-24 DIAGNOSIS — I340 Nonrheumatic mitral (valve) insufficiency: Secondary | ICD-10-CM

## 2017-01-24 DIAGNOSIS — R61 Generalized hyperhidrosis: Secondary | ICD-10-CM

## 2017-01-24 DIAGNOSIS — I25110 Atherosclerotic heart disease of native coronary artery with unstable angina pectoris: Secondary | ICD-10-CM | POA: Diagnosis not present

## 2017-01-24 DIAGNOSIS — R0789 Other chest pain: Secondary | ICD-10-CM | POA: Diagnosis not present

## 2017-01-24 DIAGNOSIS — R531 Weakness: Secondary | ICD-10-CM | POA: Diagnosis not present

## 2017-01-24 DIAGNOSIS — I10 Essential (primary) hypertension: Secondary | ICD-10-CM

## 2017-01-24 DIAGNOSIS — I480 Paroxysmal atrial fibrillation: Secondary | ICD-10-CM | POA: Diagnosis not present

## 2017-01-24 DIAGNOSIS — R079 Chest pain, unspecified: Secondary | ICD-10-CM | POA: Diagnosis not present

## 2017-01-24 LAB — BASIC METABOLIC PANEL
Anion gap: 8 (ref 5–15)
BUN: 20 mg/dL (ref 6–20)
CO2: 22 mmol/L (ref 22–32)
Calcium: 8.7 mg/dL — ABNORMAL LOW (ref 8.9–10.3)
Chloride: 106 mmol/L (ref 101–111)
Creatinine, Ser: 0.91 mg/dL (ref 0.61–1.24)
GFR calc Af Amer: >60 mL/min (ref >60)
GFR calc non Af Amer: >60 mL/min (ref >60)
Glucose, Bld: 112 mg/dL — ABNORMAL HIGH (ref 65–99)
Potassium: 3.8 mmol/L (ref 3.5–5.1)
Sodium: 136 mmol/L (ref 135–145)

## 2017-01-24 LAB — LIPID PANEL
Cholesterol: 102 mg/dL (ref 0–200)
HDL: 39 mg/dL — ABNORMAL LOW (ref >40)
LDL Cholesterol: 48 mg/dL (ref 0–99)
Total CHOL/HDL Ratio: 2.6 RATIO
Triglycerides: 76 mg/dL (ref <150)
VLDL: 15 mg/dL (ref 0–40)

## 2017-01-24 LAB — CBC
HCT: 40.5 % (ref 39.0–52.0)
Hemoglobin: 13.9 g/dL (ref 13.0–17.0)
MCH: 30.7 pg (ref 26.0–34.0)
MCHC: 34.3 g/dL (ref 30.0–36.0)
MCV: 89.4 fL (ref 78.0–100.0)
Platelets: 159 10*3/uL (ref 150–400)
RBC: 4.53 MIL/uL (ref 4.22–5.81)
RDW: 13.2 % (ref 11.5–15.5)
WBC: 5.9 10*3/uL (ref 4.0–10.5)

## 2017-01-24 LAB — TROPONIN I
Troponin I: <0.03 ng/mL (ref <0.03)
Troponin I: <0.03 ng/mL (ref <0.03)
Troponin I: <0.03 ng/mL (ref <0.03)

## 2017-01-24 LAB — ECHOCARDIOGRAM COMPLETE
Height: 71 in
Weight: 3180.8 oz

## 2017-01-24 LAB — PROTIME-INR
INR: 1.05
Prothrombin Time: 13.7 seconds (ref 11.4–15.2)

## 2017-01-24 LAB — HEPARIN LEVEL (UNFRACTIONATED)
Heparin Unfractionated: 0.35 IU/mL (ref 0.30–0.70)
Heparin Unfractionated: 0.3 IU/mL (ref 0.30–0.70)

## 2017-01-24 MED ORDER — ORAL CARE MOUTH RINSE
15.0000 mL | Freq: Two times a day (BID) | OROMUCOSAL | Status: DC
  Administered 2017-01-24 – 2017-01-25 (×3): 15 mL via OROMUCOSAL

## 2017-01-24 NOTE — Progress Notes (Signed)
  Echocardiogram 2D Echocardiogram has been performed.  [name] 01/24/2017, 9:54 AM

## 2017-01-24 NOTE — Progress Notes (Signed)
Primary RN, Ayaba, spoke with Jory Sims about discharge for today as case management stated patient needed to be discharged or he would receive a Code 81. Curt Bears told Ayaba patient was waiting on echo results so he could not be discharged. Echo results came in around 5:15pm. Dr. Otelia Sergeant on call for cardiology - this RN text paged and he said patient could be discharged. Asked for him to complete the AVS and put in discharge order, MD stated he didn't know how to complete the AVS for discharge so patient wouldn't be able to leave tonight. This RN attempted to re-page Curt Bears regarding assistance with discharge paperwork but no return phone call. Primary RN aware of discharge barrier, to update patient.

## 2017-01-24 NOTE — Care Management CC44 (Signed)
Condition Code 44 Documentation Completed  Patient Details  Name: [name] MRN: [id number] Date of Birth: 12-Nov-1940   Condition Code 44 given:  Yes Patient signature on Condition Code 44 notice:  Yes Documentation of 2 MD's agreement:  Yes Code 44 added to claim:  Yes    Arley Phenix, RN 01/24/2017, 2:46 PM

## 2017-01-24 NOTE — Progress Notes (Signed)
Progress Note  Patient Name: [name] Date of Encounter: 01/24/2017  Primary Cardiologist: Dr. Geraldo Pitter   Subjective   Feels much better today. No chest pain or dyspnea. Had some nausea last night.   Inpatient Medications    Scheduled Meds: . amLODipine  5 mg Oral Daily  . aspirin EC  81 mg Oral Daily  . atorvastatin  40 mg Oral q1800  . cholecalciferol  1,000 Units Oral Daily  . mouth rinse  15 mL Mouth Rinse BID  . metoprolol tartrate  25 mg Oral BID  . omega-3 acid ethyl esters  1 g Oral Daily  . pantoprazole  40 mg Oral Daily   Continuous Infusions: . heparin     PRN Meds: acetaminophen, HYDROcodone-acetaminophen, methocarbamol, nitroGLYCERIN, ondansetron (ZOFRAN) IV, polyvinyl alcohol   Vital Signs    Vitals:   01/23/17 2255 01/23/17 2359 01/24/17 0539 01/24/17 1030  BP: 110/71 107/67 102/63 123/65  Pulse: 66 66 (!) 57 64  Resp:   19   Temp: 97.8 F (36.6 C)  97.6 F (36.4 C)   TempSrc: Oral  Oral   SpO2: 96%  98%   Weight: 200 lb 14.4 oz (91.1 kg)  198 lb 12.8 oz (90.2 kg)   Height: 5\' 11"  (1.803 m)       Intake/Output Summary (Last 24 hours) at 01/24/2017 1121 Last data filed at 01/24/2017 0631 Gross per 24 hour  Intake 132 ml  Output 100 ml  Net 32 ml   Filed Weights   01/23/17 2255 01/24/17 0539  Weight: 200 lb 14.4 oz (91.1 kg) 198 lb 12.8 oz (90.2 kg)    Telemetry    NSR - Personally Reviewed  ECG    NSR with normal ECG - Personally Reviewed  Physical Exam   GEN: No acute distress.   Neck: No JVD Cardiac: RRR, no murmurs, rubs, or gallops.  Respiratory: Clear to auscultation bilaterally. GI: Soft, nontender, non-distended  MS: No edema; No deformity. Neuro:  Nonfocal  Psych: Normal affect   Labs    Chemistry Recent Labs  Lab 01/24/17 0521  NA 136  K 3.8  CL 106  CO2 22  GLUCOSE 112*  BUN 20  CREATININE 0.91  CALCIUM 8.7*  GFRNONAA >60  GFRAA >60  ANIONGAP 8     Hematology Recent Labs  Lab  01/24/17 0521  WBC 5.9  RBC 4.53  HGB 13.9  HCT 40.5  MCV 89.4  MCH 30.7  MCHC 34.3  RDW 13.2  PLT 159    Cardiac Enzymes Recent Labs  Lab 01/23/17 2327 01/24/17 0521  TROPONINI <0.03 <0.03   No results for input(s): TROPIPOC in the last 168 hours.   BNPNo results for input(s): BNP, PROBNP in the last 168 hours.   DDimer No results for input(s): DDIMER in the last 168 hours.   Radiology    No results found.  Cardiac Studies   none  Patient Profile     76 y.o. 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 No real chest pain per my history.     Assessment & Plan    1. Acute weakness and diaphoresis associated with severe elevation of BP. Normal troponin levels. Ecg without acute change. Given prior cardiac cath 8 months ago I doubt this represents an ACS. Will check LFTs. Check CXR. Echo done this am and will review. DC IV  heparin and ambulate today. Possible DC in am if stable. 2. HTN now well controlled.  3. HLD on statin. LDL well controlled. 4. Nausea. On protonix. Check LFTs. Exam benign.   For questions or updates, please contact [place] Please consult [email or web address] for contact info under Cardiology/STEMI.      Signed, Jimmey Hengel [place], MD  01/24/2017, 11:21 AM

## 2017-01-24 NOTE — Progress Notes (Signed)
Ambulated patient 250 feet down the hallway. Denies chest pain, shortness of breath,No assistive device needed steady on his feet. Leveda Anna, BSN, RN

## 2017-01-25 DIAGNOSIS — R079 Chest pain, unspecified: Secondary | ICD-10-CM

## 2017-01-25 DIAGNOSIS — I25110 Atherosclerotic heart disease of native coronary artery with unstable angina pectoris: Secondary | ICD-10-CM | POA: Diagnosis not present

## 2017-01-25 MED ORDER — ATORVASTATIN CALCIUM 40 MG PO TABS: 40.0000 mg | ORAL_TABLET | Freq: Every day | ORAL | 3 refills | Status: DC

## 2017-01-25 MED ORDER — NITROGLYCERIN 0.4 MG SL SUBL: 0.4000 mg | SUBLINGUAL_TABLET | SUBLINGUAL | 12 refills | Status: DC | PRN

## 2017-01-25 NOTE — Discharge Instructions (Signed)
Chest Wall Pain °Chest wall pain is pain in or around the bones and muscles of your chest. Sometimes, an injury causes this pain. Sometimes, the cause may not be known. This pain may take several weeks or longer to get better. °Follow these instructions at home: °Pay attention to any changes in your symptoms. Take these actions to help with your pain: °· Rest as told by your doctor. °· Avoid activities that cause pain. Try not to use your chest, belly (abdominal), or side muscles to lift heavy things. °· If directed, apply ice to the painful area: °? Put ice in a plastic bag. °? Place a towel between your skin and the bag. °? Leave the ice on for 20 minutes, 2-3 times per day. °· Take over-the-counter and prescription medicines only as told by your doctor. °· Do not use tobacco products, including cigarettes, chewing tobacco, and e-cigarettes. If you need help quitting, ask your doctor. °· Keep all follow-up visits as told by your doctor. This is important. ° °Contact a doctor if: °· You have a fever. °· Your chest pain gets worse. °· You have new symptoms. °Get help right away if: °· You feel sick to your stomach (nauseous) or you throw up (vomit). °· You feel sweaty or light-headed. °· You have a cough with phlegm (sputum) or you cough up blood. °· You are short of breath. °This information is not intended to replace advice given to you by your health care provider. Make sure you discuss any questions you have with your health care provider. °Document Released: 07/16/2007 Document Revised: 07/05/2015 Document Reviewed: 04/24/2014 °Elsevier Interactive Patient Education © 2018 [employer] ° °

## 2017-01-25 NOTE — Progress Notes (Signed)
Progress Note  Patient Name: [name] Date of Encounter: 01/25/2017  Primary Cardiologist: Dr. Geraldo Pitter   Subjective   Feels great today. No chest pain or dyspnea. No nausea or weakness.  Inpatient Medications    Scheduled Meds: . amLODipine  5 mg Oral Daily  . aspirin EC  81 mg Oral Daily  . atorvastatin  40 mg Oral q1800  . cholecalciferol  1,000 Units Oral Daily  . mouth rinse  15 mL Mouth Rinse BID  . metoprolol tartrate  25 mg Oral BID  . omega-3 acid ethyl esters  1 g Oral Daily  . pantoprazole  40 mg Oral Daily   Continuous Infusions:  PRN Meds: acetaminophen, HYDROcodone-acetaminophen, methocarbamol, nitroGLYCERIN, ondansetron (ZOFRAN) IV, polyvinyl alcohol   Vital Signs    Vitals:   01/24/17 2041 01/24/17 2226 01/25/17 0527 01/25/17 0836  BP:  114/61 120/77 133/67  Pulse: 82 65 60   Resp:  17 18   Temp:  98.5 F (36.9 C) 97.8 F (36.6 C)   TempSrc:  Oral Oral   SpO2:  94% 95%   Weight:   195 lb 11.2 oz (88.8 kg)   Height:        Intake/Output Summary (Last 24 hours) at 01/25/2017 0953 Last data filed at 01/24/2017 1748 Gross per 24 hour  Intake 524 ml  Output -  Net 524 ml   Filed Weights   01/23/17 2255 01/24/17 0539 01/25/17 0527  Weight: 200 lb 14.4 oz (91.1 kg) 198 lb 12.8 oz (90.2 kg) 195 lb 11.2 oz (88.8 kg)    Telemetry    NSR - Personally Reviewed  ECG    None today.  Physical Exam   GEN: No acute distress.   Neck: No JVD Cardiac: RRR, no murmurs, rubs, or gallops.  Respiratory: Clear to auscultation bilaterally. GI: Soft, nontender, non-distended  MS: No edema; No deformity. Neuro:  Nonfocal  Psych: Normal affect   Labs    Chemistry Recent Labs  Lab 01/24/17 0521  NA 136  K 3.8  CL 106  CO2 22  GLUCOSE 112*  BUN 20  CREATININE 0.91  CALCIUM 8.7*  GFRNONAA >60  GFRAA >60  ANIONGAP 8     Hematology Recent Labs  Lab 01/24/17 0521  WBC 5.9  RBC 4.53  HGB 13.9  HCT 40.5  MCV 89.4  MCH 30.7   MCHC 34.3  RDW 13.2  PLT 159    Cardiac Enzymes Recent Labs  Lab 01/23/17 2327 01/24/17 0521 01/24/17 1039  TROPONINI <0.03 <0.03 <0.03   No results for input(s): TROPIPOC in the last 168 hours.   BNPNo results for input(s): BNP, PROBNP in the last 168 hours.   DDimer No results for input(s): DDIMER in the last 168 hours.   Radiology    Dg Chest 2 View  Result Date: 01/24/2017 CLINICAL DATA:  Chest pain EXAM: CHEST  2 VIEW COMPARISON:  01/23/2017 FINDINGS: Cardiac shadow is within normal limits. The lungs are well aerated bilaterally. No focal infiltrate or sizable effusion is seen. No acute bony abnormality is noted. IMPRESSION: No active cardiopulmonary disease. Electronically Signed   By: Inez Catalina M.D.   On: 01/24/2017 12:48    Cardiac Studies    Echo: Study Conclusions  - Left ventricle: The cavity size was normal. Wall thickness was   increased in a pattern of mild LVH. Systolic function was normal.   The estimated ejection fraction was in the range of 60% to 65%.  Wall motion was normal; there were no regional wall motion   abnormalities. Doppler parameters are consistent with abnormal   left ventricular relaxation (grade 1 diastolic dysfunction). - Aortic valve: Trileaflet; mildly thickened leaflets. Left   coronary cusp mobility was mildly restricted. Valve area (Vmax):   2.67 cm^2. - Mitral valve: There was mild regurgitation. - Left atrium: The atrium was at the upper limits of normal in   size. - Right ventricle: The cavity size was mildly dilated. - Right atrium: Central venous pressure (est): 3 mm Hg. - Tricuspid valve: There was mild regurgitation. - Pulmonary arteries: PA peak pressure: 32 mm Hg (S). - Pericardium, extracardiac: There was no pericardial effusion  Patient Profile     76 y.o. 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 No real chest pain per my history.     Assessment & Plan    1. Acute weakness and diaphoresis associated with severe elevation of BP. Normal troponin levels. Ecg without acute change. Given prior cardiac cath 8 months ago I doubt this represents an ACS. CXR normal.  Echo normal. BP has been normal throughout his hospital stay.  Will DC home today with outpatient follow up. 3. HLD on statin. LDL well controlled. 4. Nausea. On protonix. Resolved.   For questions or updates, please contact [place] Please consult [email or web address] for contact info under Cardiology/STEMI.      Signed, Peter Martinique, MD  01/25/2017, 9:53 AM

## 2017-01-25 NOTE — Progress Notes (Signed)
Patients assessment unchanged from this am. D/c home via wheelchair to private vehicle with family. No further questions per patient

## 2017-01-25 NOTE — Discharge Summary (Signed)
Discharge Summary    Patient ID: [name],  MRN: [id number], DOB/AGE: 05-31-1940 76 y.o.  Admit date: 01/23/2017 Discharge date: 01/25/2017  Primary Care Provider: No primary care provider on file. Primary Cardiologist: Dr. Geraldo Pitter  Discharge Diagnoses    Active Problems:   Unstable angina ([place])   Chest pain   Non obstructive CAD   PAF  Allergies Allergies  Allergen Reactions  . Hydrocodone Other (See Comments)    confusion  . Percocet [Oxycodone-Acetaminophen]     Rectal bleeding    Diagnostic Studies/Procedures    Echo 01/24/17 Study Conclusions  - Left ventricle: The cavity size was normal. Wall thickness was   increased in a pattern of mild LVH. Systolic function was normal.   The estimated ejection fraction was in the range of 60% to 65%.   Wall motion was normal; there were no regional wall motion   abnormalities. Doppler parameters are consistent with abnormal   left ventricular relaxation (grade 1 diastolic dysfunction). - Aortic valve: Trileaflet; mildly thickened leaflets. Left   coronary cusp mobility was mildly restricted. Valve area (Vmax):   2.67 cm^2. - Mitral valve: There was mild regurgitation. - Left atrium: The atrium was at the upper limits of normal in   size. - Right ventricle: The cavity size was mildly dilated. - Right atrium: Central venous pressure (est): 3 mm Hg. - Tricuspid valve: There was mild regurgitation. - Pulmonary arteries: PA peak pressure: 32 mm Hg (S). - Pericardium, extracardiac: There was no pericardial effusion.   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   [name] works as a Teacher, early years/pre. He had come back from the school which is 1/2 block when he was at home and developed sudden onset of fatigue, weakness and clammy feeling.  He drove himself to the school to get evaluated by the nurse. His BP was ~200/100s and was sent to [facility] ED. He was tachypneic with some chest pressure/tightness on arrival. His symptoms of chest pressure/SOB resolved with SLN. He required a nitro patch to maintain control of his symptoms.   He denies any new medications or activities. His wife passed away and therefore he takes care of the house himself but stays active with work. He does have a history of atypical and typical chest pain that was managed with diagnostics and medications.   Hospital Course     Consultants: None  The patient was admitted for further evaluation. Initially symptoms was concerning for unstable angina vs uncontrolled HTN. Started on heparin gtt. Normal troponin levels. Ecg without acute change. Given prior cardiac cath 8 months ago doubt this represents an ACS. CXR normal.  Echo normal. BP has been normal throughout his hospital stay.  Uncertain etiology of presented symptoms. He denies real chest pain during evaluation by Dr. Martinique. No recurrent chest discomfort during admission.  The patient has been seen by Dr. Martinique today and deemed ready for discharge home. All follow-up appointments have been scheduled. Discharge medications are listed below.    Discharge Vitals Blood pressure 133/67, pulse 60, temperature 97.8 F (36.6 C), temperature source Oral, resp. rate 18, height 5\' 11"  (1.803 m), weight 195 lb 11.2 oz (88.8 kg), SpO2 95 %.  Filed Weights   01/23/17 2255 01/24/17 0539 01/25/17 0527  Weight: 200 lb 14.4  oz (91.1 kg) 198 lb 12.8 oz (90.2 kg) 195 lb 11.2 oz (88.8 kg)    Labs & Radiologic Studies     CBC Recent Labs    01/24/17 0521  WBC 5.9  HGB 13.9  HCT 40.5  MCV 89.4  PLT 053   Basic Metabolic Panel Recent Labs    01/24/17 0521  NA 136  K 3.8  CL 106  CO2 22  GLUCOSE 112*  BUN 20  CREATININE 0.91  CALCIUM 8.7*   Cardiac Enzymes Recent Labs    01/23/17 2327  01/24/17 0521 01/24/17 1039  TROPONINI <0.03 <0.03 <0.03   Fasting Lipid Panel Recent Labs    01/24/17 0521  CHOL 102  HDL 39*  LDLCALC 48  TRIG 76  CHOLHDL 2.6    Dg Chest 2 View  Result Date: 01/24/2017 CLINICAL DATA:  Chest pain EXAM: CHEST  2 VIEW COMPARISON:  01/23/2017 FINDINGS: Cardiac shadow is within normal limits. The lungs are well aerated bilaterally. No focal infiltrate or sizable effusion is seen. No acute bony abnormality is noted. IMPRESSION: No active cardiopulmonary disease. Electronically Signed   By: Inez Catalina M.D.   On: 01/24/2017 12:48    Disposition   Pt is being discharged home today in good condition.  Follow-up Plans & Appointments    Follow-up Information    Revankar, Reita Cliche, MD. Go on 01/30/2017.   Specialty:  Cardiology Why:  @9 :40am for post hospital follow up Contact information: [address] [place]  [place] [address] [phone]          Discharge Instructions    Diet - low sodium heart healthy   Complete by:  As directed    Increase activity slowly   Complete by:  As directed       Discharge Medications   Allergies as of 01/25/2017      Reactions   Hydrocodone Other (See Comments)   confusion   Percocet [oxycodone-acetaminophen]    Rectal bleeding      Medication List    TAKE these medications   acetaminophen 500 MG tablet Commonly known as:  TYLENOL Take 500 mg by mouth every 6 (six) hours as needed for mild pain or headache. For headache/pain   amLODipine 5 MG tablet Commonly known as:  NORVASC Take 5 mg by mouth daily.   aspirin EC 81 MG tablet Take 81 mg by mouth daily.   cholecalciferol 1000 units tablet Commonly known as:  VITAMIN D Take 1,000 Units by mouth daily.   DEXILANT 60 MG capsule Generic drug:  dexlansoprazole Take 60 mg by mouth daily.   FISH OIL 300 MG Caps Take 1 capsule by mouth daily.   HYDROcodone-acetaminophen 5-325 MG tablet Commonly known as:   NORCO/VICODIN Take 1-2 tablets by mouth every 4 (four) hours as needed (mild pain).   ibuprofen 200 MG tablet Commonly known as:  ADVIL,MOTRIN Take 200-400 mg by mouth every 6 (six) hours as needed for headache or moderate pain.   methocarbamol 500 MG tablet Commonly known as:  ROBAXIN Take 1 tablet (500 mg total) by mouth every 6 (six) hours as needed for muscle spasms.   metoprolol tartrate 25 MG tablet Commonly known as:  LOPRESSOR Take 25 mg by mouth 2 (two) times daily.   naproxen sodium 220 MG tablet Commonly known as:  ALEVE Take 220-440 mg by mouth as needed (Headache, Pain).   nitroGLYCERIN 0.4 MG SL tablet Commonly known as:  NITROSTAT Place 1 tablet (0.4  mg total) under the tongue every 5 (five) minutes x 3 doses as needed for chest pain.   REFRESH DRY EYE THERAPY 1-1 % Soln Generic drug:  Glycerin-Polysorbate 80 Place 1-2 drops into both eyes 2 (two) times daily as needed (for dry eyes).         Outstanding Labs/Studies   None  Duration of Discharge Encounter   Greater than 30 minutes including physician time.  Signed, Crista Luria Kalen Neidert PA-C 01/25/2017, 10:55 AM

## 2017-01-30 ENCOUNTER — Ambulatory Visit: Payer: Medicare Other | Admitting: Cardiology

## 2017-01-30 ENCOUNTER — Encounter: Payer: Self-pay | Admitting: Cardiology

## 2017-01-30 VITALS — BP 130/78 | HR 73 | Ht 71.0 in | Wt 205.0 lb

## 2017-01-30 DIAGNOSIS — I2510 Atherosclerotic heart disease of native coronary artery without angina pectoris: Secondary | ICD-10-CM

## 2017-01-30 DIAGNOSIS — E785 Hyperlipidemia, unspecified: Secondary | ICD-10-CM | POA: Diagnosis not present

## 2017-01-30 DIAGNOSIS — I10 Essential (primary) hypertension: Secondary | ICD-10-CM

## 2017-01-30 NOTE — Progress Notes (Signed)
Cardiology Office Note:    Date:  01/30/2017   ID:  [name], DOB October 01, 1940, MRN [id number]  PCP:  Rochel Brome, MD  Cardiologist:  Jenean Lindau, MD   Referring MD: Rochel Brome, MD    ASSESSMENT:    1. Coronary artery disease involving native coronary artery of native heart without angina pectoris   2. Essential hypertension   3. Dyslipidemia    PLAN:    In order of problems listed above:  1. Secondary prevention stressed with the patient.  Importance of compliance with diet and medications stressed and he vocalized understanding.  I discussed with him the importance of regular exercise and the risks of sedentary lifestyle 2. His blood pressure stable.  Lipids are followed by his primary care physician. 3. Echocardiogram was unremarkable with preserved systolic function and findings detailed to the patient.  Lipids were also discussed with the patient. 4. I told him the importance of losing weight with exercise and diet.  I reviewed hospital records extensively and questions were answered to his and his family member satisfaction.  He will be seen in follow-up appointment in 8 months or earlier if he has any concerns.  Total time for this evaluation was 40 minutes.   Medication Adjustments/Labs and Tests Ordered: Current medicines are reviewed at length with the patient today.  Concerns regarding medicines are outlined above.  No orders of the defined types were placed in this encounter.  No orders of the defined types were placed in this encounter.    History of Present Illness:    [name] is a 76 y.o. male who is being seen today for the evaluation of coronary artery disease at the request of Cox, Kirsten, MD.  Patient has recently gone to the emergency room for elevated blood pressure.  He mentions to me that he went to [facility] first and subsequently to [facility].  I reviewed all those records extensively.  Patient is here for  follow-up.  He leads a sedentary lifestyle.  He denies orthopnea or PND.  No dizziness or any such symptoms.  No orthopnea.  At the time of my evaluation, the patient is alert awake oriented and in no distress.  Past Medical History:  Diagnosis Date  . Arthritis   . Atrial fibrillation ([place])   . Dysrhythmia   . GERD (gastroesophageal reflux disease)   . Hyperlipemia   . Hypertension   . PONV (postoperative nausea and vomiting)     Past Surgical History:  Procedure Laterality Date  . anal fissures    . BACK SURGERY    . CARDIAC CATHETERIZATION     no PCI  . CHOLECYSTECTOMY    . COLONOSCOPY W/ POLYPECTOMY    . EYE SURGERY     cataract removal bilateral eyes  . FOOT SURGERY     left heel  . HERNIA REPAIR    . SHOULDER SURGERY     bilateral    Current Medications: Current Meds  Medication Sig  . acetaminophen (TYLENOL) 500 MG tablet Take 500 mg by mouth every 6 (six) hours as needed for mild pain or headache. For headache/pain  . amLODipine (NORVASC) 5 MG tablet Take 5 mg by mouth daily.  Marland Kitchen aspirin EC 81 MG tablet Take 81 mg by mouth daily.  Marland Kitchen atorvastatin (LIPITOR) 40 MG tablet TAKE 1 TABLET (40 MG TOTAL) BY MOUTH DAILY AT 6 PM.  . cholecalciferol (VITAMIN D) 1000 UNITS tablet Take 1,000 Units by mouth  daily.  . finasteride (PROSCAR) 5 MG tablet Take 5 mg by mouth daily.  . Glycerin-Polysorbate 80 (REFRESH DRY EYE THERAPY) 1-1 % SOLN Place 1-2 drops into both eyes 2 (two) times daily as needed (for dry eyes).  Marland Kitchen ibuprofen (ADVIL,MOTRIN) 200 MG tablet Take 200-400 mg by mouth every 6 (six) hours as needed for headache or moderate pain.  . methocarbamol (ROBAXIN) 500 MG tablet Take 1 tablet (500 mg total) by mouth every 6 (six) hours as needed for muscle spasms.  . metoprolol tartrate (LOPRESSOR) 25 MG tablet Take 25 mg by mouth 2 (two) times daily.  . naproxen sodium (ALEVE) 220 MG tablet Take 220-440 mg by mouth as needed (Headache, Pain).  . nitroGLYCERIN (NITROSTAT) 0.4 MG  SL tablet Place 1 tablet (0.4 mg total) under the tongue every 5 (five) minutes x 3 doses as needed for chest pain.  . Omega-3 Fatty Acids (FISH OIL) 300 MG CAPS Take 1 capsule by mouth daily.  . tamsulosin (FLOMAX) 0.4 MG CAPS capsule TAKE 1 (ONE) CAPSULE BY MOUTH 1/2 HOUR AFTER HIS EVENING MEAL     Allergies:   Hydrocodone and Percocet [oxycodone-acetaminophen]   Social History   Socioeconomic History  . Marital status: Widowed    Spouse name: None  . Number of children: None  . Years of education: None  . Highest education level: None  Social Needs  . Financial resource strain: None  . Food insecurity - worry: None  . Food insecurity - inability: None  . Transportation needs - medical: None  . Transportation needs - non-medical: None  Occupational History  . None  Tobacco Use  . Smoking status: Former Smoker    Packs/day: 0.50    Years: 35.00    Pack years: 17.50    Types: Cigarettes    Last attempt to quit: 02/10/1993    Years since quitting: 23.9  . Smokeless tobacco: Former Systems developer    Types: [place] date: 02/10/2006  Substance and Sexual Activity  . Alcohol use: No  . Drug use: No  . Sexual activity: None  Other Topics Concern  . None  Social History Narrative  . None     Family History: The patient's family history is not on file.  ROS:   Please see the history of present illness.    All other systems reviewed and are negative.  EKGs/Labs/Other Studies Reviewed:    The following studies were reviewed today: He was sinus rhythm and was otherwise unremarkable   Recent Labs: 01/24/2017: BUN 20; Creatinine, Ser 0.91; Hemoglobin 13.9; Platelets 159; Potassium 3.8; Sodium 136  Recent Lipid Panel    Component Value Date/Time   CHOL 102 01/24/2017 0521   TRIG 76 01/24/2017 0521   HDL 39 (L) 01/24/2017 0521   CHOLHDL 2.6 01/24/2017 0521   VLDL 15 01/24/2017 0521   LDLCALC 48 01/24/2017 0521    Physical Exam:    VS:  BP 130/78 (BP Location: Left Arm,  Patient Position: Sitting)   Pulse 73   Ht 5\' 11"  (1.803 m)   Wt 205 lb (93 kg)   SpO2 97%   BMI 28.59 kg/m     Wt Readings from Last 3 Encounters:  01/30/17 205 lb (93 kg)  01/25/17 195 lb 11.2 oz (88.8 kg)  09/29/13 204 lb (92.5 kg)     GEN: Patient is in no acute distress HEENT: Normal NECK: No JVD; No carotid bruits LYMPHATICS: No lymphadenopathy CARDIAC: S1 S2 regular, 2/6 systolic  murmur at the apex. RESPIRATORY:  Clear to auscultation without rales, wheezing or rhonchi  ABDOMEN: Soft, non-tender, non-distended MUSCULOSKELETAL:  No edema; No deformity  SKIN: Warm and dry NEUROLOGIC:  Alert and oriented x 3 PSYCHIATRIC:  Normal affect    Signed, Jenean Lindau, MD  01/30/2017 10:12 AM    [place]

## 2017-01-30 NOTE — Patient Instructions (Signed)
Medication Instructions:  Your physician recommends that you continue on your current medications as directed. Please refer to the Current Medication list given to you today.  Labwork: None  Testing/Procedures: None  Follow-Up: Your physician recommends that you schedule a follow-up appointment in: August, 2019  Any Other Special Instructions Will Be Listed Below (If Applicable).     If you need a refill on your cardiac medications before your next appointment, please call your pharmacy.   [place], RN, BSN

## 2017-05-15 ENCOUNTER — Other Ambulatory Visit: Payer: Self-pay | Admitting: Physician Assistant

## 2017-05-15 NOTE — Telephone Encounter (Signed)
This is a [place] pt. °

## 2017-06-11 ENCOUNTER — Telehealth: Payer: Self-pay

## 2017-06-11 NOTE — Telephone Encounter (Signed)
Would you like to give refills on Pantoprazole? [facility] [place]

## 2017-06-12 NOTE — Telephone Encounter (Signed)
Pantoprazole 40 mg p.o. once a day, #30 with 6 refills

## 2017-06-15 MED ORDER — PANTOPRAZOLE SODIUM 40 MG PO TBEC: 40.0000 mg | DELAYED_RELEASE_TABLET | Freq: Every day | ORAL | 6 refills | Status: DC

## 2017-06-15 NOTE — Telephone Encounter (Signed)
Sent to patient's pharmacy.

## 2017-08-16 ENCOUNTER — Other Ambulatory Visit: Payer: Self-pay

## 2017-08-16 ENCOUNTER — Emergency Department (HOSPITAL_BASED_OUTPATIENT_CLINIC_OR_DEPARTMENT_OTHER): Payer: Medicare Other

## 2017-08-16 ENCOUNTER — Emergency Department (HOSPITAL_BASED_OUTPATIENT_CLINIC_OR_DEPARTMENT_OTHER)
Admission: EM | Admit: 2017-08-16 | Discharge: 2017-08-16 | Disposition: A | Discharge status: Home / Self Care | Payer: Medicare Other | Attending: Emergency Medicine | Admitting: Emergency Medicine

## 2017-08-16 ENCOUNTER — Encounter (HOSPITAL_BASED_OUTPATIENT_CLINIC_OR_DEPARTMENT_OTHER): Payer: Self-pay | Admitting: Emergency Medicine

## 2017-08-16 DIAGNOSIS — R1031 Right lower quadrant pain: Secondary | ICD-10-CM | POA: Diagnosis present

## 2017-08-16 DIAGNOSIS — Z87891 Personal history of nicotine dependence: Secondary | ICD-10-CM | POA: Diagnosis not present

## 2017-08-16 DIAGNOSIS — I2510 Atherosclerotic heart disease of native coronary artery without angina pectoris: Secondary | ICD-10-CM | POA: Diagnosis not present

## 2017-08-16 DIAGNOSIS — Z7982 Long term (current) use of aspirin: Secondary | ICD-10-CM | POA: Insufficient documentation

## 2017-08-16 DIAGNOSIS — I10 Essential (primary) hypertension: Secondary | ICD-10-CM | POA: Diagnosis not present

## 2017-08-16 LAB — CBC
HCT: 49 % (ref 39.0–52.0)
Hemoglobin: 17.4 g/dL — ABNORMAL HIGH (ref 13.0–17.0)
MCH: 31 pg (ref 26.0–34.0)
MCHC: 35.5 g/dL (ref 30.0–36.0)
MCV: 87.3 fL (ref 78.0–100.0)
Platelets: 141 10*3/uL — ABNORMAL LOW (ref 150–400)
RBC: 5.61 MIL/uL (ref 4.22–5.81)
RDW: 13.7 % (ref 11.5–15.5)
WBC: 6.4 10*3/uL (ref 4.0–10.5)

## 2017-08-16 LAB — URINALYSIS, MICROSCOPIC (REFLEX)

## 2017-08-16 LAB — COMPREHENSIVE METABOLIC PANEL
ALT: 46 U/L — ABNORMAL HIGH (ref 0–44)
AST: 37 U/L (ref 15–41)
Albumin: 4.3 g/dL (ref 3.5–5.0)
Alkaline Phosphatase: 85 U/L (ref 38–126)
Anion gap: 10 (ref 5–15)
BUN: 22 mg/dL (ref 8–23)
CO2: 23 mmol/L (ref 22–32)
Calcium: 8.5 mg/dL — ABNORMAL LOW (ref 8.9–10.3)
Chloride: 103 mmol/L (ref 98–111)
Creatinine, Ser: 0.95 mg/dL (ref 0.61–1.24)
GFR calc Af Amer: >60 mL/min (ref >60)
GFR calc non Af Amer: >60 mL/min (ref >60)
Glucose, Bld: 120 mg/dL — ABNORMAL HIGH (ref 70–99)
Potassium: 3.8 mmol/L (ref 3.5–5.1)
Sodium: 136 mmol/L (ref 135–145)
Total Bilirubin: 1.3 mg/dL — ABNORMAL HIGH (ref 0.3–1.2)
Total Protein: 7.7 g/dL (ref 6.5–8.1)

## 2017-08-16 LAB — URINALYSIS, ROUTINE W REFLEX MICROSCOPIC
Bilirubin Urine: NEGATIVE
Glucose, UA: NEGATIVE mg/dL
Ketones, ur: 15 mg/dL — AB
Leukocytes, UA: NEGATIVE
Nitrite: NEGATIVE
Protein, ur: NEGATIVE mg/dL
Specific Gravity, Urine: >1.03 — ABNORMAL HIGH (ref 1.005–1.030)
pH: 5.5 (ref 5.0–8.0)

## 2017-08-16 LAB — LIPASE, BLOOD: Lipase: 39 U/L (ref 11–51)

## 2017-08-16 LAB — I-STAT CG4 LACTIC ACID, ED: Lactic Acid, Venous: 1.86 mmol/L (ref 0.5–1.9)

## 2017-08-16 MED ORDER — IOPAMIDOL (ISOVUE-300) INJECTION 61%
100.0000 mL | Freq: Once | INTRAVENOUS | Status: AC | PRN
  Administered 2017-08-16: 100 mL via INTRAVENOUS

## 2017-08-16 MED ORDER — SODIUM CHLORIDE 0.9 % IV BOLUS
1000.0000 mL | Freq: Once | INTRAVENOUS | Status: AC
  Administered 2017-08-16: 1000 mL via INTRAVENOUS

## 2017-08-16 MED ORDER — ONDANSETRON HCL 4 MG/2ML IJ SOLN
4.0000 mg | Freq: Once | INTRAMUSCULAR | Status: AC
  Administered 2017-08-16: 4 mg via INTRAVENOUS
  Filled 2017-08-16: qty 2

## 2017-08-16 MED ORDER — FENTANYL CITRATE (PF) 100 MCG/2ML IJ SOLN
50.0000 ug | Freq: Once | INTRAMUSCULAR | Status: AC
Start: 2017-08-16 — End: 2017-08-16
  Administered 2017-08-16: 50 ug via INTRAVENOUS
  Filled 2017-08-16: qty 2

## 2017-08-16 NOTE — ED Triage Notes (Signed)
Patient states that he has had right lower abdominal pain since last week - the patient reports that he is having nausea and has had diarrhea  - patient went to the urgent care and was seen - sent here for possible bowel obstruction

## 2017-08-16 NOTE — ED Provider Notes (Signed)
[facility] EMERGENCY DEPARTMENT Provider Note   CSN: [id number] Arrival date & time: 08/16/17  1757     History   Chief Complaint Chief Complaint  Patient presents with  . Abdominal Pain    HPI [name] is a 77 y.o. male with a  past medical history of hypertension, hyperlipidemia, atrial fibrillation (not on current anticoagulation) with prior abdominal surgeries including hernia repair (right inguinal with reported mesh) and cholecystectomy who presents the emergency department today for right lower abdominal pain.  Patient notes that on Tuesday he started having right lower abdominal pain without radiation.  He reports he has some associated nausea with this.  He states that nothing precipitates his pain but it usually last several hours before eating being relieved on its own.  He has not tried anything for symptoms.  He was seen by urgent care today and sent over for possible bowel obstruction.  He is unsure if he had fever while at urgent care but states that "I think this is something about that".  He denies any fever at home.  He denies any emesis, upper abdominal pain, flank pain, urinary symptoms.  He states that the pain did radiate into his groin today.  He denies any testicular pain/swelling.  He is in a monogamous relationship with his wife does not use protection.  He has no concerns for STDs at this time. He reports two loose/watery bowel movements today.  He denies any melena or hematochezia.  He still passing gas.  No medication given prior to arrival.  HPI  Past Medical History:  Diagnosis Date  . Arthritis   . Atrial fibrillation ([place])   . Dysrhythmia   . GERD (gastroesophageal reflux disease)   . Hyperlipemia   . Hypertension   . PONV (postoperative nausea and vomiting)     Patient Active Problem List   Diagnosis Date Noted  . 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 Dyslipidemia 06/17/2016   . Essential hypertension 06/17/2016  . S/P lumbar spinal fusion 09/29/2013    Past Surgical History:  Procedure Laterality Date  . anal fissures    . BACK SURGERY    . CARDIAC CATHETERIZATION     no PCI  . CHOLECYSTECTOMY    . COLONOSCOPY W/ POLYPECTOMY    . EYE SURGERY     cataract removal bilateral eyes  . FOOT SURGERY     left heel  . HERNIA REPAIR    . SHOULDER SURGERY     bilateral        Home Medications    Prior to Admission medications   Medication Sig Start Date End Date Taking? Authorizing Provider  acetaminophen (TYLENOL) 500 MG tablet Take 500 mg by mouth every 6 (six) hours as needed for mild pain or headache. For headache/pain    [provider]  amLODipine (NORVASC) 5 MG tablet Take 5 mg by mouth daily.    [provider]  aspirin EC 81 MG tablet Take 81 mg by mouth daily.    [provider]  atorvastatin (LIPITOR) 40 MG tablet TAKE 1 TABLET (40 MG TOTAL) BY MOUTH DAILY AT 6 PM. 01/25/17   [provider]  atorvastatin (LIPITOR) 40 MG tablet TAKE 1 TABLET (40 MG TOTAL) BY MOUTH DAILY AT 6 PM. 05/15/17   Revankar, Reita Cliche, MD  cholecalciferol (VITAMIN D) 1000 UNITS tablet Take 1,000 Units by mouth daily.    [provider]  dexlansoprazole Ross Marcus)  60 MG capsule Take 60 mg by mouth daily.    [provider]  finasteride (PROSCAR) 5 MG tablet Take 5 mg by mouth daily. 01/27/17   [provider]  Glycerin-Polysorbate 80 (REFRESH DRY EYE THERAPY) 1-1 % SOLN Place 1-2 drops into both eyes 2 (two) times daily as needed (for dry eyes).    [provider]  HYDROcodone-acetaminophen (NORCO/VICODIN) 5-325 MG per tablet Take 1-2 tablets by mouth every 4 (four) hours as needed (mild pain). Patient not taking: Reported on 01/24/2017 09/30/13   Eustace Moore, MD  ibuprofen (ADVIL,MOTRIN) 200 MG tablet Take 200-400 mg by mouth every 6 (six) hours as needed for headache or moderate pain.    [provider]  methocarbamol (ROBAXIN) 500 MG tablet Take 1 tablet (500 mg total) by mouth every 6 (six) hours as needed for muscle spasms. 09/30/13   Eustace Moore, MD  metoprolol tartrate (LOPRESSOR) 25 MG tablet Take 25 mg by mouth 2 (two) times daily.    [provider]  naproxen sodium (ALEVE) 220 MG tablet Take 220-440 mg by mouth as needed (Headache, Pain).    [provider]  nitroGLYCERIN (NITROSTAT) 0.4 MG SL tablet Place 1 tablet (0.4 mg total) under the tongue every 5 (five) minutes x 3 doses as needed for chest pain. 01/25/17   Bhagat, Crista Luria, PA  Omega-3 Fatty Acids (FISH OIL) 300 MG CAPS Take 1 capsule by mouth daily.    [provider]  pantoprazole (PROTONIX) 40 MG tablet Take 1 tablet (40 mg total) by mouth daily. 06/15/17   Jackquline Denmark, MD  tamsulosin (FLOMAX) 0.4 MG CAPS capsule TAKE 1 (ONE) CAPSULE BY MOUTH 1/2 HOUR AFTER HIS EVENING MEAL 01/27/17   [provider]    Family History History reviewed. No pertinent family history.  Social History Social History   Tobacco Use  . Smoking status: Former Smoker    Packs/day: 0.50    Years: 35.00    Pack years: 17.50    Types: Cigarettes    Last attempt to quit: 02/10/1993    Years since quitting: 24.5  . Smokeless tobacco: Former Systems developer    Types: Smackover date: 02/10/2006  Substance Use Topics  . Alcohol use: No  . Drug use: No     Allergies   Hydrocodone and Percocet [oxycodone-acetaminophen]   Review of Systems Review of Systems  All other systems reviewed and are negative.    Physical Exam Updated Vital Signs BP (!) 132/99 (BP Location: Left Arm)   Pulse 99   Temp 98.9 F (37.2 C) (Oral)   Resp 18   Ht 5\' 8"  (1.727 m)   Wt 92.1 kg (203 lb)   SpO2 100%   BMI 30.87 kg/m   Physical Exam  Constitutional: He appears well-developed and well-nourished.  HENT:  Head: Normocephalic and atraumatic.  Right Ear: External ear normal.  Left Ear: External ear  normal.  Nose: Nose normal.  Mouth/Throat: Uvula is midline, oropharynx is clear and moist and mucous membranes are normal. No tonsillar exudate.  Eyes: Pupils are equal, round, and reactive to light. Right eye exhibits no discharge. Left eye exhibits no discharge. No scleral icterus.  Neck: Trachea normal. Neck supple. No spinous process tenderness present. No neck rigidity. Normal range of motion present.  Cardiovascular: Normal rate, regular rhythm and intact distal pulses.  No murmur heard. Pulses:      Radial pulses are 2+ on the right side, and 2+  on the left side.       Dorsalis pedis pulses are 2+ on the right side, and 2+ on the left side.       Posterior tibial pulses are 2+ on the right side, and 2+ on the left side.  No lower extremity swelling or edema. Calves symmetric in size bilaterally.  Pulmonary/Chest: Effort normal and breath sounds normal. He exhibits no tenderness.  Abdominal: Soft. He exhibits no distension. Bowel sounds are decreased. There is tenderness in the right lower quadrant. There is no rigidity, no rebound, no guarding, no CVA tenderness and negative Murphy's sign. A hernia (umbillical, reducible) is present.  Genitourinary: Testes normal. Right testis shows no mass, no swelling and no tenderness. Left testis shows no mass, no swelling and no tenderness.  Genitourinary Comments: Chaperone present during genital exam. No external genital lesions noted, no bumps on head of penis, specifically no vesicles concerning for herpes or chancre suggestive of syphillis, no pain with palpation, no discharge or urethritis noted, scrotum and testicles w/o erythema or swelling, NTTP   Musculoskeletal: He exhibits no edema.  Lymphadenopathy:    He has no cervical adenopathy.  Neurological: He is alert.  Skin: Skin is warm and dry. No rash noted. He is not diaphoretic.  Psychiatric: He has a normal mood and affect.  Nursing note and vitals reviewed.    ED Treatments /  Results  Labs (all labs ordered are listed, but only abnormal results are displayed) Labs Reviewed  COMPREHENSIVE METABOLIC PANEL - Abnormal; Notable for the following components:      Result Value   Glucose, Bld 120 (*)    Calcium 8.5 (*)    ALT 46 (*)    Total Bilirubin 1.3 (*)    All other components within normal limits  CBC - Abnormal; Notable for the following components:   Hemoglobin 17.4 (*)    Platelets 141 (*)    All other components within normal limits  URINALYSIS, ROUTINE W REFLEX MICROSCOPIC - Abnormal; Notable for the following components:   Specific Gravity, Urine >1.030 (*)    Hgb urine dipstick TRACE (*)    Ketones, ur 15 (*)    All other components within normal limits  URINALYSIS, MICROSCOPIC (REFLEX) - Abnormal; Notable for the following components:   Bacteria, UA FEW (*)    Non Squamous Epithelial PRESENT (*)    All other components within normal limits  LIPASE, BLOOD  B. BURGDORFI ANTIBODIES  EHRLICHIA ANTIBODY PANEL  ROCKY MTN SPOTTED FVR ABS PNL(IGG+IGM)  I-STAT CG4 LACTIC ACID, ED    EKG None  Radiology Dg Chest 2 View  Result Date: 08/16/2017 CLINICAL DATA:  Right lower abdominal pain since last week. EXAM: CHEST - 2 VIEW COMPARISON:  01/24/2017 FINDINGS: The lungs are clear without focal pneumonia, edema, pneumothorax or pleural effusion. The cardiopericardial silhouette is within normal limits for size. Probable chronic left shoulder separation. Bones of the thorax otherwise appear intact. IMPRESSION: No acute cardiopulmonary findings. Electronically Signed   By: Misty Stanley M.D.   On: 08/16/2017 21:16   Ct Abdomen Pelvis W Contrast  Result Date: 08/16/2017 CLINICAL DATA:  Right lower abdominal pain since last week.  Nausea. EXAM: CT ABDOMEN AND PELVIS WITH CONTRAST TECHNIQUE: Multidetector CT imaging of the abdomen and pelvis was performed using the standard protocol following bolus administration of intravenous contrast. CONTRAST:  175mL  ISOVUE-300 IOPAMIDOL (ISOVUE-300) INJECTION 61% COMPARISON:  03/04/2016 FINDINGS: Lower chest: Calcified granuloma noted in the posterior right lower lobe Hepatobiliary:  No focal abnormality within the liver parenchyma. Tiny hypodensity in the lateral segment of the left liver is stable and compatible with a cyst. Gallbladder surgically absent. No intrahepatic or extrahepatic biliary dilation. Pancreas: Stomach is nondistended. No gastric wall thickening. No evidence of outlet obstruction. Spleen: No splenomegaly. No focal mass lesion. Adrenals/Urinary Tract: No adrenal nodule or mass. 11 mm cyst upper pole right kidney is stable. 14 mm exophytic cyst lower pole left kidney is unchanged. No evidence for hydroureter. The urinary bladder appears normal for the degree of distention. Stomach/Bowel: Tiny hiatal hernia. Stomach otherwise unremarkable. Duodenum is normally positioned as is the ligament of Treitz. No small bowel wall thickening. No small bowel dilatation. The terminal ileum is normal. The appendix is normal. Diverticular changes are noted in the left colon without evidence of diverticulitis. Vascular/Lymphatic: There is abdominal aortic atherosclerosis without aneurysm. There is no gastrohepatic or hepatoduodenal ligament lymphadenopathy. No intraperitoneal or retroperitoneal lymphadenopathy. No pelvic sidewall lymphadenopathy. Reproductive: The prostate gland and seminal vesicles have normal imaging features. Other: No intraperitoneal free fluid. Musculoskeletal: Status post lumbar fusion. No worrisome lytic or sclerotic osseous abnormality. IMPRESSION: 1. Stable exam.  No acute findings in the abdomen or pelvis. 2. 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 Left hepatic and bilateral renal cysts. 3. Tiny hiatal hernia. 4.  Aortic Atherosclerois (ICD10-170.0) Electronically Signed   By: Misty Stanley M.D.   On: 08/16/2017 20:03    Procedures Procedures (including critical care  time)  Medications Ordered in ED Medications  iopamidol (ISOVUE-300) 61 % injection 100 mL (100 mLs Intravenous Contrast Given 08/16/17 1946)  sodium chloride 0.9 % bolus 1,000 mL (0 mLs Intravenous Stopped 08/16/17 2120)  ondansetron (ZOFRAN) injection 4 mg (4 mg Intravenous Given 08/16/17 1934)  fentaNYL (SUBLIMAZE) injection 50 mcg (50 mcg Intravenous Given 08/16/17 1934)  sodium chloride 0.9 % bolus 1,000 mL (0 mLs Intravenous Stopped 08/16/17 2332)     Initial Impression / Assessment and Plan / ED Course  I have reviewed the triage vital signs and the nursing notes.  Pertinent labs & imaging results that were available during my care of the patient were reviewed by me and considered in my medical decision making (see chart for details).     77 y.o. male with a  past medical history of hypertension, hyperlipidemia, atrial fibrillation (not on current anticoagulation) with prior abdominal surgeries including hernia repair (right inguinal with reported mesh) and cholecystectomy who presents the emergency department today for right lower abdominal pain that began on Tuesday and is associate with nausea as well as loose stools.  He was sent over here today for possible bowel obstruction by urgent care.  However the patient is without emesis and is still passing gas.  Vital signs are reassuring on presentation.  Patient is without fever, tachycardia, tachypnea, hypoxia or hypotension.  He is nonseptic appearing.  Patient with reducible umbilical hernia.  There is right lower quadrant tenderness but otherwise benign exam.  Negative McBurney's point.  No palpable right lower/inguinal hernia.  GU exam reassuring as above.   Lactic acid is within normal limits.  No leukocytosis.  No concern for sepsis at this time.  There is no anemia.  No significant elect light derangements.  Mild hyperglycemia but without evidence of DKA.  Patient with mild increase of bilirubin as well as ALT.  He is without any right  upper quadrant tenderness to make me concern for cholecystitis.  Kidney function within normal limits.  Lipase within normal limits.  CT scan  shows no acute findings of the abdomen or pelvis.  There is diverticulosis without evidence of diverticulitis.  Patient does have a history of A. fib but I have a low suspicion for ischemic bowel at this time.  Repeat abdominal exam without any peritoneal signs.  Patient has a nonsurgical abdomen.  UA without evidence of UTI.  My attending went to speak with patient he did complain of more generalized symptoms including recent tick bites and a cough.  Please see their note for the details.  Chest x-ray was ordered and was reassuring.  Lyme and RMSF titers are pending.  No need to treat prophylactically at this time.  Patient's lungs are clear to auscultation bilaterally.  He is without tachycardia and he is satting at greater than 95% on room air.  He is not complaining of shortness of breath or chest pain.    The evaluation does not show pathology that would require ongoing emergent intervention or inpatient treatment. I advised the patient to follow-up with PCP this week. I advised the patient to return to the emergency department with new or worsening symptoms or new concerns. Specific return precautions discussed. The patient verbalized understanding and agreement with plan. All questions answered. No further questions at this time. The patient is hemodynamically stable, mentating appropriately and appears safe for discharge.  Patient case seen and discussed with Dr. Johnney Killian who is in agreement with plan.   Final Clinical Impressions(s) / ED Diagnoses   Final diagnoses:  Right lower quadrant abdominal pain    ED Discharge Orders    None       Lorelle Gibbs 08/18/17 1455    Charlesetta Shanks, MD 08/19/17 1714

## 2017-08-16 NOTE — ED Provider Notes (Addendum)
Medical screening examination/treatment/procedure(s) were conducted as a shared visit with non-physician practitioner(s) and myself.  I personally evaluated the patient during the encounter.  None Patient has been expensing right lower abdominal pain for several days.  It has been waxing and waning.  No associated testicular pain.  Patient describes some fatigue and waxing and waning generalized weakness.  He reports he has had a generalized headache he has not documented a fever at home but is felt some chills.  Recent tick bite that he is observed.  Patient is alert and nontoxic.  No respiratory distress.  Heart regular.  Lungs grossly clear.  Abdomen soft, focal tenderness right groin but no palpable mass.  Inguinal canal free of hernia.  Patient does not seem to have reproducible intra-abdominal pain at this time..  Normal neurovascular exam. No lower extremity edema.  Agree with plan of management.   Charlesetta Shanks, MD 08/19/17 1714    Charlesetta Shanks, MD 08/19/17 [phone]

## 2017-08-16 NOTE — Discharge Instructions (Signed)
Please read and follow all provided instructions You have been seen today for your complaint of: right lower abdominal pain Your lab work and imaging was reassuring.  Your lyme and [place] fever titers are pending. You will receive a call if these are positive. Please follow up with your pcp for this as well. Follow attached handouts.   Vital signs: See below  Abdominal Pain  Your exam might not show the exact reason you have abdominal pain. Since there are many different causes of abdominal pain, another checkup and more tests may be needed. It is very important to follow up for lasting (persistent) or worsening symptoms. A possible cause of abdominal pain in any person who still has his or her appendix is acute appendicitis. Appendicitis is often hard to diagnose. Normal blood tests, urine tests, ultrasound, and CT scans do not completely rule out early appendicitis or other causes of abdominal pain. Sometimes, only the changes that happen over time will allow appendicitis and other causes of abdominal pain to be determined. Other potential problems that may require surgery may also take time to become more apparent. Because of this, it is important that you follow all of the instructions below.   HOME CARE INSTRUCTIONS  Do not take laxatives unless directed by your caregiver. Rest as much as possible.  Do not eat solid food until your pain is gone: A diet of water, weak decaffeinated tea, broth or bouillon, gelatin, oral rehydration solutions (ORS), frozen ice pops, or ice chips may be helpful.  When pain is gone: Start a light diet (dry toast, crackers, applesauce, or white rice). Increase the diet slowly as long as it does not bother you. Eat no dairy products (including cheese and eggs) and no spicy, fatty, fried, or high-fiber foods.  Use no alcohol, caffeine, or cigarettes.  Take your regular medicines unless your caregiver told you not to.  Take any prescribed medicine as  directed.   SEEK IMMEDIATE MEDICAL CARE IF:  The pain does not go away.  You have a fever >101 that does not go down with medication. You keep throwing up (vomiting) or cannot drink liquids.  You have shaking chills (rigors) The pain becomes localized (Pain in the right side could possibly be appendicitis. In an adult, pain in the left lower portion of the abdomen could be colitis or diverticulitis). You pass bloody or black tarry stools.  There is bright red blood in the stool.  There is blood in your vomit. Your bowel movements stop (become blocked) or you cannot pass gas.  The constipation stays for more than 4 days.  You have bloody, frequent, or painful urination.  You have yellow discoloration in the skin or whites of the eyes.  Your stomach becomes bloated or bigger.  You have dizziness or fainting.  You have chest or back pain. You have rectal pain.  You do not seem to be getting better.  You have any questions or concerns.   Your vital signs today were: BP 122/77    Pulse 75    Temp 98.9 F (37.2 C) (Oral)    Resp 18    Ht 5\' 8"  (1.727 m)    Wt 92.1 kg (203 lb)    SpO2 92%    BMI 30.87 kg/m  If your blood pressure (bp) was elevated above 135/85 this visit, please have this repeated by your doctor within one month.

## 2017-08-16 NOTE — ED Notes (Signed)
Pt and wife given d/c instructions as per chart. Verbalizes understanding. No questions.

## 2017-08-18 LAB — EHRLICHIA ANTIBODY PANEL
E chaffeensis (HGE) Ab, IgG: NEGATIVE
E chaffeensis (HGE) Ab, IgM: NEGATIVE
E. Chaffeensis (HME) IgM Titer: NEGATIVE
E.Chaffeensis (HME) IgG: NEGATIVE

## 2017-08-18 LAB — B. BURGDORFI ANTIBODIES: B burgdorferi Ab IgG+IgM: <0.91 {ISR} (ref 0.00–0.90)

## 2017-08-19 LAB — ROCKY MTN SPOTTED FVR ABS PNL(IGG+IGM)
RMSF IgG: NEGATIVE
RMSF IgM: 0.25 index (ref 0.00–0.89)

## 2017-12-13 ENCOUNTER — Other Ambulatory Visit: Payer: Self-pay | Admitting: Gastroenterology

## 2018-01-12 DIAGNOSIS — M25519 Pain in unspecified shoulder: Secondary | ICD-10-CM | POA: Insufficient documentation

## 2018-01-12 DIAGNOSIS — R202 Paresthesia of skin: Secondary | ICD-10-CM

## 2018-01-12 DIAGNOSIS — M25511 Pain in right shoulder: Secondary | ICD-10-CM | POA: Insufficient documentation

## 2018-01-12 DIAGNOSIS — M7540 Impingement syndrome of unspecified shoulder: Secondary | ICD-10-CM | POA: Insufficient documentation

## 2018-01-12 DIAGNOSIS — M4800 Spinal stenosis, site unspecified: Secondary | ICD-10-CM

## 2018-01-12 HISTORY — DX: Impingement syndrome of unspecified shoulder: M75.40

## 2018-01-12 HISTORY — DX: Spinal stenosis, site unspecified: M48.00

## 2018-01-12 HISTORY — DX: Pain in unspecified shoulder: M25.519

## 2018-01-12 HISTORY — DX: Paresthesia of skin: R20.2

## 2018-01-18 DIAGNOSIS — G5600 Carpal tunnel syndrome, unspecified upper limb: Secondary | ICD-10-CM

## 2018-01-18 HISTORY — DX: Carpal tunnel syndrome, unspecified upper limb: G56.00

## 2018-01-20 ENCOUNTER — Other Ambulatory Visit: Payer: Self-pay | Admitting: Physical Medicine and Rehabilitation

## 2018-01-20 DIAGNOSIS — M7542 Impingement syndrome of left shoulder: Secondary | ICD-10-CM

## 2018-01-22 ENCOUNTER — Ambulatory Visit
Admission: RE | Admit: 2018-01-22 | Discharge: 2018-01-22 | Disposition: A | Discharge status: Home / Self Care | Payer: Medicare Other | Source: Ambulatory Visit | Attending: Physical Medicine and Rehabilitation | Admitting: Physical Medicine and Rehabilitation

## 2018-01-22 DIAGNOSIS — M7542 Impingement syndrome of left shoulder: Secondary | ICD-10-CM

## 2018-01-26 ENCOUNTER — Other Ambulatory Visit: Payer: Self-pay | Admitting: Neurological Surgery

## 2018-01-26 DIAGNOSIS — M4802 Spinal stenosis, cervical region: Secondary | ICD-10-CM

## 2018-01-26 HISTORY — DX: Spinal stenosis, cervical region: M48.02

## 2018-02-04 ENCOUNTER — Other Ambulatory Visit: Payer: Self-pay | Admitting: Gastroenterology

## 2018-02-24 NOTE — Pre-Procedure Instructions (Signed)
[name]  02/24/2018      [id number] - [place], [place] - [address] [place] [address] Phone: [phone] Fax: [phone]    Your procedure is scheduled on Jan. 23  Report to [facility] Admitting at 7:45  A.M.  Call this number if you have problems the morning of surgery:  [phone]   Remember:  Do not eat or drink after midnight.      Take these medicines the morning of surgery with A SIP OF WATER :              Amlodipine (norvasc)             Finasteride (proscar)             Eye drops             Nitroglycerine if needed             Oxycodone if needed             Pantoprazole (protonix)             7 days prior to surgery STOP taking any Aspirin (unless otherwise instructed by your surgeon), Aleve, Naproxen, Ibuprofen, Motrin, Advil, Goody's, BC's, all herbal medications, fish oil, and all vitamins.                Follow your surgeon's instructions on when to stop Asprin.  If no instructions were given by your surgeon then you will need to call the office to get those instructions.      Do not wear jewelry.  Do not wear lotions, powders, or perfumes, or deodorant.  Do not shave 48 hours prior to surgery.  Men may shave face and neck.  Do not bring valuables to the hospital.  [facility] is not responsible for any belongings or valuables.  Contacts, dentures or bridgework may not be worn into surgery.  Leave your suitcase in the car.  After surgery it may be brought to your room.  For patients admitted to the hospital, discharge time will be determined by your treatment team.  Patients discharged the day of surgery will not be allowed to drive home.    Special instructions:   [place]- Preparing For Surgery  Before surgery, you can play an important role. Because skin is not sterile, your skin needs to be as free of germs as possible. You can reduce the number of germs on your skin by washing with  CHG (chlorahexidine gluconate) Soap before surgery.  CHG is an antiseptic cleaner which kills germs and bonds with the skin to continue killing germs even after washing.    Oral Hygiene is also important to reduce your risk of infection.  Remember - BRUSH YOUR TEETH THE MORNING OF SURGERY WITH YOUR REGULAR TOOTHPASTE  Please do not use if you have an allergy to CHG or antibacterial soaps. If your skin becomes reddened/irritated stop using the CHG.  Do not shave (including legs and underarms) for at least 48 hours prior to first CHG shower. It is OK to shave your face.  Please follow these instructions carefully.   1. Shower the NIGHT BEFORE SURGERY and the MORNING OF SURGERY with CHG.   2. If you chose to wash your hair, wash your hair first as usual with your normal shampoo.  3. After you shampoo, rinse your hair and body thoroughly to remove the shampoo.  4. Use CHG as  you would any other liquid soap. You can apply CHG directly to the skin and wash gently with a scrungie or a clean washcloth.   5. Apply the CHG Soap to your body ONLY FROM THE NECK DOWN.  Do not use on open wounds or open sores. Avoid contact with your eyes, ears, mouth and genitals (private parts). Wash Face and genitals (private parts)  with your normal soap.  6. Wash thoroughly, paying special attention to the area where your surgery will be performed.  7. Thoroughly rinse your body with warm water from the neck down.  8. DO NOT shower/wash with your normal soap after using and rinsing off the CHG Soap.  9. Pat yourself dry with a CLEAN TOWEL.  10. Wear CLEAN PAJAMAS to bed the night before surgery, wear comfortable clothes the morning of surgery  11. Place CLEAN SHEETS on your bed the night of your first shower and DO NOT SLEEP WITH PETS.    Day of Surgery:  Do not apply any deodorants/lotions.  Please wear clean clothes to the hospital/surgery center.   Remember to brush your teeth WITH YOUR REGULAR  TOOTHPASTE.    Please read over the following fact sheets that you were given. Coughing and Deep Breathing, MRSA Information and Surgical Site Infection Prevention

## 2018-02-25 ENCOUNTER — Encounter (HOSPITAL_COMMUNITY)
Admission: RE | Admit: 2018-02-25 | Discharge: 2018-02-25 | Disposition: A | Discharge status: Home / Self Care | Payer: Medicare Other | Source: Ambulatory Visit | Attending: Neurological Surgery | Admitting: Neurological Surgery

## 2018-02-25 ENCOUNTER — Ambulatory Visit (HOSPITAL_COMMUNITY)
Admission: RE | Admit: 2018-02-25 | Discharge: 2018-02-25 | Disposition: A | Discharge status: Home / Self Care | Payer: Medicare Other | Source: Ambulatory Visit | Attending: Neurological Surgery | Admitting: Neurological Surgery

## 2018-02-25 ENCOUNTER — Encounter (HOSPITAL_COMMUNITY): Payer: Self-pay

## 2018-02-25 DIAGNOSIS — M4802 Spinal stenosis, cervical region: Secondary | ICD-10-CM | POA: Diagnosis not present

## 2018-02-25 DIAGNOSIS — G5600 Carpal tunnel syndrome, unspecified upper limb: Secondary | ICD-10-CM | POA: Insufficient documentation

## 2018-02-25 HISTORY — DX: Malignant (primary) neoplasm, unspecified: C80.1

## 2018-02-25 HISTORY — DX: Personal history of urinary calculi: Z87.442

## 2018-02-25 HISTORY — DX: Headache, unspecified: R51.9

## 2018-02-25 HISTORY — DX: Headache: R51

## 2018-02-25 HISTORY — DX: Personal history of other diseases of the digestive system: Z87.19

## 2018-02-25 LAB — CBC WITH DIFFERENTIAL/PLATELET
Abs Immature Granulocytes: 0.03 10*3/uL (ref 0.00–0.07)
Basophils Absolute: 0 10*3/uL (ref 0.0–0.1)
Basophils Relative: 0 %
Eosinophils Absolute: 0 10*3/uL (ref 0.0–0.5)
Eosinophils Relative: 0 %
HCT: 46.9 % (ref 39.0–52.0)
Hemoglobin: 15.4 g/dL (ref 13.0–17.0)
Immature Granulocytes: 0 %
Lymphocytes Relative: 15 %
Lymphs Abs: 1.2 10*3/uL (ref 0.7–4.0)
MCH: 28.9 pg (ref 26.0–34.0)
MCHC: 32.8 g/dL (ref 30.0–36.0)
MCV: 88 fL (ref 80.0–100.0)
Monocytes Absolute: 0.8 10*3/uL (ref 0.1–1.0)
Monocytes Relative: 9 %
Neutro Abs: 6.4 10*3/uL (ref 1.7–7.7)
Neutrophils Relative %: 76 %
Platelets: 244 10*3/uL (ref 150–400)
RBC: 5.33 MIL/uL (ref 4.22–5.81)
RDW: 13.2 % (ref 11.5–15.5)
WBC: 8.5 10*3/uL (ref 4.0–10.5)
nRBC: 0 % (ref 0.0–0.2)

## 2018-02-25 LAB — BASIC METABOLIC PANEL
Anion gap: 10 (ref 5–15)
BUN: 19 mg/dL (ref 8–23)
CO2: 22 mmol/L (ref 22–32)
Calcium: 9.9 mg/dL (ref 8.9–10.3)
Chloride: 105 mmol/L (ref 98–111)
Creatinine, Ser: 0.82 mg/dL (ref 0.61–1.24)
GFR calc Af Amer: >60 mL/min (ref >60)
GFR calc non Af Amer: >60 mL/min (ref >60)
Glucose, Bld: 101 mg/dL — ABNORMAL HIGH (ref 70–99)
Potassium: 4.1 mmol/L (ref 3.5–5.1)
Sodium: 137 mmol/L (ref 135–145)

## 2018-02-25 LAB — SURGICAL PCR SCREEN
MRSA, PCR: NEGATIVE
Staphylococcus aureus: NEGATIVE

## 2018-02-25 LAB — PROTIME-INR
INR: 0.98
Prothrombin Time: 12.9 seconds (ref 11.4–15.2)

## 2018-02-25 NOTE — Progress Notes (Signed)
PCP - Shayne Alken @ [facility] in [place] -  Dr. Geraldo Pitter GI: Dr. Lyndel Safe  Chest x-ray - today EKG - today Stress Test  na ECHO - 12/18 Cardiac Cath - 3/18  Sleep Study - na CPAP -   Fasting Blood Sugar - na Checks Blood Sugar _____ times a day  Blood Thinner Instructions: Aspirin Instructions: will stop 1 week prior to surgery per Dr. Ronnald Ramp  Anesthesia review:   Patient denies shortness of breath, fever, cough and chest pain at PAT appointment   Patient verbalized understanding of instructions that were given to them at the PAT appointment. Patient was also instructed that they will need to review over the PAT instructions again at home before surgery.

## 2018-02-26 NOTE — Progress Notes (Signed)
Anesthesia Chart Review:  Case:  [id number] Date/Time:  03/04/18 0715   Procedures:      ACDF - C3-C4 - C4-C5 (N/A )     Carpal Tunnel Release - left (Left )   Anesthesia type:  General   Pre-op diagnosis:  Stenosis - carpal tunnel syndrome   Location:  [facility] OR ROOM 19 / [place] [facility]   Surgeon:  Eustace Moore, MD     DISCUSSION: 78 yo male former smoker. 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  Pt reports hx of esophageal spasms that cause retrosternal pain, under the care of GI, Dr. Lyndel Safe. Because of this, he has had several cardiac workups. In 2018 he was hospitalized for chest pain, seen by Dr. Geraldo Pitter. Per Dr. Julien Nordmann note from 06/17/2016: "Patient was evaluated by me at [facility]. He had very impressive symptoms of angina. I transferred him to [facility] for coronary angiography. His coronary angiography revealed nonobstructive disease and subsequently standpoint. No chest pain orthopnea or PND. At the time of my evaluation is alert awake oriented and in no distress. He has a superficial spasm for which he is under the care of a gastroenterologist."  He was hospitalized again in late 2018 for chest pain. Per d/c summary 01/25/2017 by Leanor Kail, PA-C "Initially symptoms was concerning for unstable angina vs uncontrolled HTN. Started on heparin gtt. Normal troponin levels. Ecg without acute change. Given prior cardiac cath 8 months ago doubt this represents an ACS.CXR normal. Echo normal. BP has been normal throughout his hospital stay.  Uncertain etiology of presented symptoms. He denies real chest pain during evaluation by Dr. Martinique. No recurrent chest discomfort during admission."  Pt f/u post discharge with Dr. Lennox Pippins 01/30/2017 who stressed secondary prevention, no further testing.   Given nonobstructive CAD by cath 04/2016 and normal LV function by echo 01/2017  anticipate he can proceed as planned barring acute status change.  VS: BP 131/78   Pulse 71   Temp 36.6 C   Ht 5' 11.5" (1.816 m)   Wt 87.9 kg   SpO2 95%   BMI 26.64 kg/m   PROVIDERS: Rochel Brome, MD is PCP  Jyl Heinz, MD is Cardiologist  Jackquline Denmark, MD is Gastroenterologist  LABS: Labs reviewed: Acceptable for surgery. (all labs ordered are listed, but only abnormal results are displayed)  Labs Reviewed  BASIC METABOLIC PANEL - Abnormal; Notable for the following components:      Result Value   Glucose, Bld 101 (*)    All other components within normal limits  SURGICAL PCR SCREEN  CBC WITH DIFFERENTIAL/PLATELET  PROTIME-INR     IMAGES: CHEST - 2 VIEW 02/25/2018  COMPARISON:  08/16/2017  FINDINGS: Cardiac shadows within normal limits. Mild aortic calcifications are again seen. The lungs are clear. Degenerative changes of the thoracic spine are noted.  IMPRESSION: No acute abnormality noted.  EKG: 02/25/2018: Normal sinus rhythm. Rate 63  CV: TTE 01/24/2017: - Left ventricle: The cavity size was normal. Wall thickness was   increased in a pattern of mild LVH. Systolic function was normal.   The estimated ejection fraction was in the range of 60% to 65%.   Wall motion was normal; there were no regional wall motion   abnormalities. Doppler parameters are consistent with abnormal   left ventricular relaxation (grade 1 diastolic dysfunction). - Aortic valve: Trileaflet; mildly thickened leaflets. Left   coronary cusp mobility  was mildly restricted. Valve area (Vmax):   2.67 cm^2. - Mitral valve: There was mild regurgitation. - Left atrium: The atrium was at the upper limits of normal in   size. - Right ventricle: The cavity size was mildly dilated. - Right atrium: Central venous pressure (est): 3 mm Hg. - Tricuspid valve: There was mild regurgitation. - Pulmonary arteries: PA peak pressure: 32 mm Hg (S). - Pericardium, extracardiac: There was  no pericardial effusion.  Cath 05/07/2016 (care everywhere): Conclusions Diagnostic Procedure Summary Mild non obstructive CAD. Normal LV function --Ef 65% Diagnostic Procedure Recommendations Recommend work up for non-cardiac causes of symptoms.  Past Medical History:  Diagnosis Date  . Arthritis   . Atrial fibrillation ([place])   . Cancer ([place]) 10/2015   prostate cancer reated with radiation  . Dysrhythmia   . GERD (gastroesophageal reflux disease)   . Headache   . History of hiatal hernia   . History of kidney stones   . Hyperlipemia   . Hypertension   . PONV (postoperative nausea and vomiting)     Past Surgical History:  Procedure Laterality Date  . anal fissures    . BACK SURGERY    . CARDIAC CATHETERIZATION     no PCI  . CHOLECYSTECTOMY    . COLONOSCOPY W/ POLYPECTOMY    . EYE SURGERY     cataract removal bilateral eyes  . FOOT SURGERY     left heel  . HERNIA REPAIR    . SHOULDER SURGERY     bilateral    MEDICATIONS: . amLODipine (NORVASC) 5 MG tablet  . aspirin EC 81 MG tablet  . atorvastatin (LIPITOR) 40 MG tablet  . cholecalciferol (VITAMIN D) 1000 UNITS tablet  . finasteride (PROSCAR) 5 MG tablet  . Glycerin-Polysorbate 80 (REFRESH DRY EYE THERAPY) 1-1 % SOLN  . lisinopril (PRINIVIL,ZESTRIL) 10 MG tablet  . nitroGLYCERIN (NITROSTAT) 0.4 MG SL tablet  . Omega-3 Fatty Acids (FISH OIL PO)  . oxyCODONE-acetaminophen (PERCOCET/ROXICET) 5-325 MG tablet  . pantoprazole (PROTONIX) 40 MG tablet  . tamsulosin (FLOMAX) 0.4 MG CAPS capsule   No current facility-administered medications for this encounter.

## 2018-02-26 NOTE — Anesthesia Preprocedure Evaluation (Addendum)
Anesthesia Evaluation  Patient identified by MRN, date of birth, ID band Patient awake    Reviewed: Allergy & Precautions, NPO status , Patient's Chart, lab work & pertinent test results  History of Anesthesia Complications (+) PONV and history of anesthetic complications  Airway Mallampati: I  TM Distance: >3 FB Neck ROM: Full    Dental  (+) Edentulous Upper, Edentulous Lower, Dental Advisory Given   Pulmonary neg pulmonary ROS, former smoker,    Pulmonary exam normal        Cardiovascular hypertension, Pt. on medications + CAD  Normal cardiovascular exam+ dysrhythmias Atrial Fibrillation   Cath 05/07/2016 (care everywhere): Conclusions Diagnostic Procedure Summary Mild non obstructive CAD. Normal LV function --Ef 65% Diagnostic Procedure Recommendations Recommend work up for non-cardiac causes of symptoms.    Neuro/Psych  Headaches,    GI/Hepatic Neg liver ROS, hiatal hernia, GERD  ,  Endo/Other  negative endocrine ROS  Renal/GU negative Renal ROS     Musculoskeletal negative musculoskeletal ROS (+)   Abdominal   Peds  Hematology negative hematology ROS (+)   Anesthesia Other Findings Day of surgery medications reviewed with the patient.  Reproductive/Obstetrics                           Anesthesia Physical Anesthesia Plan  ASA: III  Anesthesia Plan: General   Post-op Pain Management:    Induction: Intravenous  PONV Risk Score and Plan: 4 or greater and Ondansetron, Dexamethasone and Diphenhydramine  Airway Management Planned: Oral ETT  Additional Equipment:   Intra-op Plan:   Post-operative Plan: Extubation in OR  Informed Consent: I have reviewed the patients History and Physical, chart, labs and discussed the procedure including the risks, benefits and alternatives for the proposed anesthesia with the patient or authorized representative who has indicated his/her  understanding and acceptance.     Dental advisory given  Plan Discussed with:   Anesthesia Plan Comments: (See PAT note by Karoline Caldwell, PA-C )      Anesthesia Quick Evaluation                                  Anesthesia Evaluation  Patient identified by MRN, date of birth, ID band  Reviewed: Allergy & Precautions, H&P , NPO status   History of Anesthesia Complications (+) PONV  Airway Mallampati: II  Neck ROM: Full    Dental  (+) Teeth Intact, Partial Lower, Dental Advisory Given   Pulmonary former smoker,  breath sounds clear to auscultation        Cardiovascular hypertension, + dysrhythmias Atrial Fibrillation Rhythm:Regular Rate:Normal     Neuro/Psych    GI/Hepatic GERD-  ,  Endo/Other    Renal/GU      Musculoskeletal  (+) Arthritis -,   Abdominal   Peds  Hematology   Anesthesia Other Findings   Reproductive/Obstetrics                          Anesthesia Physical Anesthesia Plan  ASA: III  Anesthesia Plan: General   Post-op Pain Management:    Induction: Intravenous  Airway Management Planned: Oral ETT  Additional Equipment:   Intra-op Plan:   Post-operative Plan: Extubation in OR  Informed Consent: I have reviewed the patients History and Physical, chart, labs and discussed the procedure including the risks, benefits and alternatives for the proposed anesthesia with  the patient or authorized representative who has indicated his/her understanding and acceptance.   Dental advisory given  Plan Discussed with:   Anesthesia Plan Comments:         Anesthesia Quick Evaluation

## 2018-03-04 ENCOUNTER — Inpatient Hospital Stay (HOSPITAL_COMMUNITY): Payer: Medicare Other

## 2018-03-04 ENCOUNTER — Inpatient Hospital Stay (HOSPITAL_COMMUNITY)
Admission: RE | Disposition: A | Discharge status: Home / Self Care | Payer: Self-pay | Source: Home / Self Care | Attending: Neurological Surgery

## 2018-03-04 ENCOUNTER — Inpatient Hospital Stay (HOSPITAL_COMMUNITY): Payer: Medicare Other | Admitting: Anesthesiology

## 2018-03-04 ENCOUNTER — Inpatient Hospital Stay (HOSPITAL_COMMUNITY)
Admission: RE | Admit: 2018-03-04 | Discharge: 2018-03-05 | DRG: 473 | Disposition: A | Discharge status: Home / Self Care | Payer: Medicare Other | Attending: Neurological Surgery | Admitting: Neurological Surgery

## 2018-03-04 ENCOUNTER — Other Ambulatory Visit: Payer: Self-pay

## 2018-03-04 ENCOUNTER — Encounter (HOSPITAL_COMMUNITY): Payer: Self-pay

## 2018-03-04 ENCOUNTER — Inpatient Hospital Stay (HOSPITAL_COMMUNITY): Payer: Medicare Other | Admitting: Physician Assistant

## 2018-03-04 DIAGNOSIS — Z885 Allergy status to narcotic agent status: Secondary | ICD-10-CM | POA: Diagnosis not present

## 2018-03-04 DIAGNOSIS — Z9049 Acquired absence of other specified parts of digestive tract: Secondary | ICD-10-CM

## 2018-03-04 DIAGNOSIS — M4802 Spinal stenosis, cervical region: Secondary | ICD-10-CM | POA: Diagnosis present

## 2018-03-04 DIAGNOSIS — I10 Essential (primary) hypertension: Secondary | ICD-10-CM | POA: Diagnosis present

## 2018-03-04 DIAGNOSIS — K219 Gastro-esophageal reflux disease without esophagitis: Secondary | ICD-10-CM | POA: Diagnosis present

## 2018-03-04 DIAGNOSIS — Z87891 Personal history of nicotine dependence: Secondary | ICD-10-CM

## 2018-03-04 DIAGNOSIS — Z8546 Personal history of malignant neoplasm of prostate: Secondary | ICD-10-CM | POA: Diagnosis not present

## 2018-03-04 DIAGNOSIS — E785 Hyperlipidemia, unspecified: Secondary | ICD-10-CM | POA: Diagnosis present

## 2018-03-04 DIAGNOSIS — Z7982 Long term (current) use of aspirin: Secondary | ICD-10-CM

## 2018-03-04 DIAGNOSIS — Z9841 Cataract extraction status, right eye: Secondary | ICD-10-CM

## 2018-03-04 DIAGNOSIS — K449 Diaphragmatic hernia without obstruction or gangrene: Secondary | ICD-10-CM | POA: Diagnosis present

## 2018-03-04 DIAGNOSIS — G5602 Carpal tunnel syndrome, left upper limb: Secondary | ICD-10-CM | POA: Diagnosis present

## 2018-03-04 DIAGNOSIS — Z419 Encounter for procedure for purposes other than remedying health state, unspecified: Secondary | ICD-10-CM

## 2018-03-04 DIAGNOSIS — Z9842 Cataract extraction status, left eye: Secondary | ICD-10-CM | POA: Diagnosis not present

## 2018-03-04 DIAGNOSIS — M47812 Spondylosis without myelopathy or radiculopathy, cervical region: Principal | ICD-10-CM | POA: Diagnosis present

## 2018-03-04 DIAGNOSIS — Z981 Arthrodesis status: Secondary | ICD-10-CM

## 2018-03-04 DIAGNOSIS — Z79899 Other long term (current) drug therapy: Secondary | ICD-10-CM | POA: Diagnosis not present

## 2018-03-04 HISTORY — PX: ANTERIOR CERVICAL DECOMP/DISCECTOMY FUSION: SHX1161

## 2018-03-04 HISTORY — PX: CARPAL TUNNEL RELEASE: SHX101

## 2018-03-04 HISTORY — DX: Arthrodesis status: Z98.1

## 2018-03-04 SURGERY — ANTERIOR CERVICAL DECOMPRESSION/DISCECTOMY FUSION 2 LEVELS
Anesthesia: General

## 2018-03-04 MED ORDER — CHLORHEXIDINE GLUCONATE CLOTH 2 % EX PADS: 6.0000 | MEDICATED_PAD | Freq: Once | CUTANEOUS | Status: DC

## 2018-03-04 MED ORDER — ONDANSETRON HCL 4 MG/2ML IJ SOLN: 4.0000 mg | Freq: Four times a day (QID) | INTRAMUSCULAR | Status: DC | PRN

## 2018-03-04 MED ORDER — METHOCARBAMOL 500 MG PO TABS
ORAL_TABLET | ORAL | Status: AC
  Filled 2018-03-04: qty 1

## 2018-03-04 MED ORDER — SODIUM CHLORIDE 0.9 % IV SOLN
INTRAVENOUS | Status: DC | PRN
  Administered 2018-03-04: 07:00:00

## 2018-03-04 MED ORDER — PROMETHAZINE HCL 25 MG/ML IJ SOLN: 6.2500 mg | INTRAMUSCULAR | Status: DC | PRN

## 2018-03-04 MED ORDER — ACETAMINOPHEN 325 MG PO TABS
650.0000 mg | ORAL_TABLET | ORAL | Status: DC | PRN
  Administered 2018-03-04: 650 mg via ORAL
  Filled 2018-03-04: qty 2

## 2018-03-04 MED ORDER — HEMOSTATIC AGENTS (NO CHARGE) OPTIME
TOPICAL | Status: DC | PRN
  Administered 2018-03-04: 1 via TOPICAL

## 2018-03-04 MED ORDER — SUGAMMADEX SODIUM 200 MG/2ML IV SOLN
INTRAVENOUS | Status: DC | PRN
  Administered 2018-03-04: 200 mg via INTRAVENOUS

## 2018-03-04 MED ORDER — LACTATED RINGERS IV SOLN
INTRAVENOUS | Status: DC | PRN
  Administered 2018-03-04: 07:00:00 via INTRAVENOUS

## 2018-03-04 MED ORDER — THROMBIN 5000 UNITS EX SOLR
CUTANEOUS | Status: DC | PRN
  Administered 2018-03-04: 5000 [IU] via TOPICAL

## 2018-03-04 MED ORDER — METHOCARBAMOL 500 MG PO TABS
500.0000 mg | ORAL_TABLET | Freq: Four times a day (QID) | ORAL | Status: DC | PRN
  Administered 2018-03-04 (×2): 500 mg via ORAL
  Filled 2018-03-04: qty 1

## 2018-03-04 MED ORDER — DEXAMETHASONE SODIUM PHOSPHATE 10 MG/ML IJ SOLN
10.0000 mg | INTRAMUSCULAR | Status: AC
  Administered 2018-03-04: 10 mg via INTRAVENOUS
  Filled 2018-03-04: qty 1

## 2018-03-04 MED ORDER — DEXAMETHASONE 4 MG PO TABS
4.0000 mg | ORAL_TABLET | Freq: Four times a day (QID) | ORAL | Status: DC
  Administered 2018-03-04 – 2018-03-05 (×4): 4 mg via ORAL
  Filled 2018-03-04 (×4): qty 1

## 2018-03-04 MED ORDER — ALUM & MAG HYDROXIDE-SIMETH 200-200-20 MG/5ML PO SUSP
30.0000 mL | Freq: Four times a day (QID) | ORAL | Status: DC | PRN
  Administered 2018-03-04 (×2): 30 mL via ORAL
  Filled 2018-03-04: qty 30

## 2018-03-04 MED ORDER — ONDANSETRON HCL 4 MG PO TABS: 4.0000 mg | ORAL_TABLET | Freq: Four times a day (QID) | ORAL | Status: DC | PRN

## 2018-03-04 MED ORDER — FENTANYL CITRATE (PF) 100 MCG/2ML IJ SOLN
25.0000 ug | INTRAMUSCULAR | Status: DC | PRN
  Administered 2018-03-04 (×2): 25 ug via INTRAVENOUS

## 2018-03-04 MED ORDER — FINASTERIDE 5 MG PO TABS
5.0000 mg | ORAL_TABLET | Freq: Every day | ORAL | Status: DC
  Administered 2018-03-04 – 2018-03-05 (×2): 5 mg via ORAL
  Filled 2018-03-04 (×2): qty 1

## 2018-03-04 MED ORDER — MENTHOL 3 MG MT LOZG
1.0000 | LOZENGE | OROMUCOSAL | Status: DC | PRN
  Filled 2018-03-04: qty 9

## 2018-03-04 MED ORDER — PANTOPRAZOLE SODIUM 40 MG PO TBEC
40.0000 mg | DELAYED_RELEASE_TABLET | Freq: Every day | ORAL | Status: DC
  Administered 2018-03-04 – 2018-03-05 (×2): 40 mg via ORAL
  Filled 2018-03-04 (×2): qty 1

## 2018-03-04 MED ORDER — SENNA 8.6 MG PO TABS
1.0000 | ORAL_TABLET | Freq: Two times a day (BID) | ORAL | Status: DC
  Administered 2018-03-04 – 2018-03-05 (×3): 8.6 mg via ORAL
  Filled 2018-03-04 (×3): qty 1

## 2018-03-04 MED ORDER — ROCURONIUM BROMIDE 100 MG/10ML IV SOLN
INTRAVENOUS | Status: DC | PRN
  Administered 2018-03-04: 80 mg via INTRAVENOUS
  Administered 2018-03-04 (×2): 20 mg via INTRAVENOUS

## 2018-03-04 MED ORDER — ACETAMINOPHEN 650 MG RE SUPP: 650.0000 mg | RECTAL | Status: DC | PRN

## 2018-03-04 MED ORDER — ONDANSETRON HCL 4 MG/2ML IJ SOLN
INTRAMUSCULAR | Status: AC
  Filled 2018-03-04: qty 2

## 2018-03-04 MED ORDER — BACITRACIN ZINC 500 UNIT/GM EX OINT
TOPICAL_OINTMENT | CUTANEOUS | Status: DC | PRN
  Administered 2018-03-04: 1 via TOPICAL

## 2018-03-04 MED ORDER — PROPOFOL 10 MG/ML IV BOLUS
INTRAVENOUS | Status: DC | PRN
  Administered 2018-03-04: 30 mg via INTRAVENOUS
  Administered 2018-03-04 (×3): 20 mg via INTRAVENOUS
  Administered 2018-03-04: 170 mg via INTRAVENOUS
  Administered 2018-03-04: 50 mg via INTRAVENOUS
  Administered 2018-03-04: 30 mg via INTRAVENOUS

## 2018-03-04 MED ORDER — MORPHINE SULFATE (PF) 2 MG/ML IV SOLN: 2.0000 mg | INTRAVENOUS | Status: DC | PRN

## 2018-03-04 MED ORDER — FENTANYL CITRATE (PF) 250 MCG/5ML IJ SOLN
INTRAMUSCULAR | Status: AC
  Filled 2018-03-04: qty 5

## 2018-03-04 MED ORDER — FENTANYL CITRATE (PF) 100 MCG/2ML IJ SOLN
INTRAMUSCULAR | Status: AC
  Filled 2018-03-04: qty 2

## 2018-03-04 MED ORDER — PHENOL 1.4 % MT LIQD: 1.0000 | OROMUCOSAL | Status: DC | PRN

## 2018-03-04 MED ORDER — SODIUM CHLORIDE 0.9% FLUSH
3.0000 mL | Freq: Two times a day (BID) | INTRAVENOUS | Status: DC
  Administered 2018-03-04: 3 mL via INTRAVENOUS

## 2018-03-04 MED ORDER — LIDOCAINE 2% (20 MG/ML) 5 ML SYRINGE
INTRAMUSCULAR | Status: DC | PRN
  Administered 2018-03-04: 100 mg via INTRAVENOUS

## 2018-03-04 MED ORDER — METHOCARBAMOL 1000 MG/10ML IJ SOLN
500.0000 mg | Freq: Four times a day (QID) | INTRAVENOUS | Status: DC | PRN
  Filled 2018-03-04: qty 5

## 2018-03-04 MED ORDER — DEXAMETHASONE SODIUM PHOSPHATE 4 MG/ML IJ SOLN: 4.0000 mg | Freq: Four times a day (QID) | INTRAMUSCULAR | Status: DC

## 2018-03-04 MED ORDER — THROMBIN 5000 UNITS EX SOLR
CUTANEOUS | Status: AC
  Filled 2018-03-04: qty 5000

## 2018-03-04 MED ORDER — AMLODIPINE BESYLATE 5 MG PO TABS
5.0000 mg | ORAL_TABLET | Freq: Every day | ORAL | Status: DC
  Administered 2018-03-05: 5 mg via ORAL

## 2018-03-04 MED ORDER — OXYCODONE HCL 5 MG PO TABS
5.0000 mg | ORAL_TABLET | ORAL | Status: DC | PRN
  Administered 2018-03-04 (×4): 5 mg via ORAL
  Filled 2018-03-04 (×3): qty 1

## 2018-03-04 MED ORDER — SODIUM CHLORIDE 0.9% FLUSH: 3.0000 mL | INTRAVENOUS | Status: DC | PRN

## 2018-03-04 MED ORDER — LISINOPRIL 10 MG PO TABS
10.0000 mg | ORAL_TABLET | Freq: Every day | ORAL | Status: DC
  Administered 2018-03-04 – 2018-03-05 (×2): 10 mg via ORAL
  Filled 2018-03-04 (×2): qty 1

## 2018-03-04 MED ORDER — SODIUM CHLORIDE 0.9 % IV SOLN
INTRAVENOUS | Status: DC | PRN
  Administered 2018-03-04: 20 ug/min via INTRAVENOUS

## 2018-03-04 MED ORDER — CEFAZOLIN SODIUM-DEXTROSE 2-4 GM/100ML-% IV SOLN
2.0000 g | INTRAVENOUS | Status: AC
  Administered 2018-03-04: 2 g via INTRAVENOUS
  Filled 2018-03-04: qty 100

## 2018-03-04 MED ORDER — CEFAZOLIN SODIUM-DEXTROSE 2-4 GM/100ML-% IV SOLN
2.0000 g | Freq: Three times a day (TID) | INTRAVENOUS | Status: AC
  Administered 2018-03-04 (×2): 2 g via INTRAVENOUS
  Filled 2018-03-04 (×2): qty 100

## 2018-03-04 MED ORDER — FENTANYL CITRATE (PF) 100 MCG/2ML IJ SOLN
INTRAMUSCULAR | Status: DC | PRN
  Administered 2018-03-04 (×6): 50 ug via INTRAVENOUS

## 2018-03-04 MED ORDER — NITROGLYCERIN 0.4 MG SL SUBL: 0.4000 mg | SUBLINGUAL_TABLET | SUBLINGUAL | Status: DC | PRN

## 2018-03-04 MED ORDER — OXYCODONE HCL 5 MG PO TABS
ORAL_TABLET | ORAL | Status: AC
  Filled 2018-03-04: qty 1

## 2018-03-04 MED ORDER — THROMBIN 5000 UNITS EX SOLR
OROMUCOSAL | Status: DC | PRN
  Administered 2018-03-04 (×2): via TOPICAL

## 2018-03-04 MED ORDER — THROMBIN 5000 UNITS EX SOLR
CUTANEOUS | Status: AC
  Filled 2018-03-04: qty 10000

## 2018-03-04 MED ORDER — ONDANSETRON HCL 4 MG/2ML IJ SOLN
INTRAMUSCULAR | Status: DC | PRN
  Administered 2018-03-04: 4 mg via INTRAVENOUS

## 2018-03-04 MED ORDER — SODIUM CHLORIDE 0.9 % IV SOLN: 250.0000 mL | INTRAVENOUS | Status: DC

## 2018-03-04 MED ORDER — BUPIVACAINE HCL (PF) 0.25 % IJ SOLN
INTRAMUSCULAR | Status: DC | PRN
  Administered 2018-03-04 (×2): 4 mL

## 2018-03-04 MED ORDER — BACITRACIN ZINC 500 UNIT/GM EX OINT
TOPICAL_OINTMENT | CUTANEOUS | Status: AC
  Filled 2018-03-04: qty 28.35

## 2018-03-04 MED ORDER — TAMSULOSIN HCL 0.4 MG PO CAPS
0.4000 mg | ORAL_CAPSULE | Freq: Every day | ORAL | Status: DC
  Administered 2018-03-04: 0.4 mg via ORAL
  Filled 2018-03-04: qty 1

## 2018-03-04 MED ORDER — LIDOCAINE 2% (20 MG/ML) 5 ML SYRINGE
INTRAMUSCULAR | Status: AC
  Filled 2018-03-04: qty 5

## 2018-03-04 MED ORDER — ROCURONIUM BROMIDE 50 MG/5ML IV SOSY
PREFILLED_SYRINGE | INTRAVENOUS | Status: AC
  Filled 2018-03-04: qty 10

## 2018-03-04 MED ORDER — POTASSIUM CHLORIDE IN NACL 20-0.9 MEQ/L-% IV SOLN: INTRAVENOUS | Status: DC

## 2018-03-04 MED ORDER — MIDAZOLAM HCL 2 MG/2ML IJ SOLN
INTRAMUSCULAR | Status: AC
  Filled 2018-03-04: qty 2

## 2018-03-04 MED ORDER — PROPOFOL 10 MG/ML IV BOLUS
INTRAVENOUS | Status: AC
  Filled 2018-03-04: qty 40

## 2018-03-04 MED ORDER — 0.9 % SODIUM CHLORIDE (POUR BTL) OPTIME
TOPICAL | Status: DC | PRN
  Administered 2018-03-04: 1000 mL

## 2018-03-04 MED ORDER — ACETAMINOPHEN 500 MG PO TABS
1000.0000 mg | ORAL_TABLET | Freq: Once | ORAL | Status: AC
  Administered 2018-03-04: 1000 mg via ORAL
  Filled 2018-03-04: qty 2

## 2018-03-04 MED ORDER — BUPIVACAINE HCL (PF) 0.25 % IJ SOLN
INTRAMUSCULAR | Status: AC
  Filled 2018-03-04: qty 30

## 2018-03-04 SURGICAL SUPPLY — 78 items
ADH SKN CLS APL DERMABOND .7 (GAUZE/BANDAGES/DRESSINGS) ×2
APL SKNCLS STERI-STRIP NONHPOA (GAUZE/BANDAGES/DRESSINGS) ×2
BAG DECANTER FOR FLEXI CONT (MISCELLANEOUS) ×3 IMPLANT
BANDAGE ACE 4X5 VEL STRL LF (GAUZE/BANDAGES/DRESSINGS) ×3 IMPLANT
BASKET BONE COLLECTION (BASKET) ×1 IMPLANT
BENZOIN TINCTURE PRP APPL 2/3 (GAUZE/BANDAGES/DRESSINGS) ×3 IMPLANT
BIT DRILL 14MM (INSTRUMENTS) IMPLANT
BLADE SURG 15 STRL LF DISP TIS (BLADE) ×2 IMPLANT
BLADE SURG 15 STRL SS (BLADE) ×3
BNDG GAUZE ELAST 4 BULKY (GAUZE/BANDAGES/DRESSINGS) ×3 IMPLANT
BUR MATCHSTICK NEURO 3.0 LAGG (BURR) ×3 IMPLANT
CABLE BIPOLOR RESECTION CORD (MISCELLANEOUS) ×3 IMPLANT
CANISTER SUCT 3000ML PPV (MISCELLANEOUS) ×5 IMPLANT
CARTRIDGE OIL MAESTRO DRILL (MISCELLANEOUS) ×4 IMPLANT
COVER WAND RF STERILE (DRAPES) ×6 IMPLANT
DERMABOND ADVANCED (GAUZE/BANDAGES/DRESSINGS) ×1
DERMABOND ADVANCED .7 DNX12 (GAUZE/BANDAGES/DRESSINGS) IMPLANT
DIFFUSER DRILL AIR PNEUMATIC (MISCELLANEOUS) ×6 IMPLANT
DRAPE C-ARM 42X72 X-RAY (DRAPES) ×6 IMPLANT
DRAPE EXTREMITY T 121X128X90 (DISPOSABLE) ×3 IMPLANT
DRAPE HALF SHEET 40X57 (DRAPES) ×3 IMPLANT
DRAPE LAPAROTOMY 100X72 PEDS (DRAPES) ×3 IMPLANT
DRAPE MICROSCOPE LEICA (MISCELLANEOUS) ×3 IMPLANT
DRILL 14MM (INSTRUMENTS) ×3
DRSG EMULSION OIL 3X3 NADH (GAUZE/BANDAGES/DRESSINGS) ×1 IMPLANT
DRSG OPSITE POSTOP 3X4 (GAUZE/BANDAGES/DRESSINGS) ×1 IMPLANT
DURAPREP 26ML APPLICATOR (WOUND CARE) ×3 IMPLANT
DURAPREP 6ML APPLICATOR 50/CS (WOUND CARE) ×3 IMPLANT
ELECT COATED BLADE 2.86 ST (ELECTRODE) ×3 IMPLANT
ELECT REM PT RETURN 9FT ADLT (ELECTROSURGICAL) ×3
ELECTRODE REM PT RTRN 9FT ADLT (ELECTROSURGICAL) ×2 IMPLANT
GAUZE 4X4 16PLY RFD (DISPOSABLE) ×3 IMPLANT
GAUZE SPONGE 4X4 12PLY STRL (GAUZE/BANDAGES/DRESSINGS) ×3 IMPLANT
GLOVE BIO SURGEON STRL SZ7 (GLOVE) IMPLANT
GLOVE BIO SURGEON STRL SZ8 (GLOVE) ×6 IMPLANT
GLOVE BIOGEL PI IND STRL 7.0 (GLOVE) IMPLANT
GLOVE BIOGEL PI IND STRL 8 (GLOVE) IMPLANT
GLOVE BIOGEL PI INDICATOR 7.0 (GLOVE)
GLOVE BIOGEL PI INDICATOR 8 (GLOVE) ×1
GLOVE ECLIPSE 7.5 STRL STRAW (GLOVE) ×1 IMPLANT
GLOVE SURG SS PI 7.0 STRL IVOR (GLOVE) ×3 IMPLANT
GOWN STRL REUS W/ TWL LRG LVL3 (GOWN DISPOSABLE) IMPLANT
GOWN STRL REUS W/ TWL XL LVL3 (GOWN DISPOSABLE) ×2 IMPLANT
GOWN STRL REUS W/TWL 2XL LVL3 (GOWN DISPOSABLE) ×3 IMPLANT
GOWN STRL REUS W/TWL LRG LVL3 (GOWN DISPOSABLE) ×6
GOWN STRL REUS W/TWL XL LVL3 (GOWN DISPOSABLE) ×6
HEMOSTAT POWDER KIT SURGIFOAM (HEMOSTASIS) ×4 IMPLANT
KIT BASIN OR (CUSTOM PROCEDURE TRAY) ×6 IMPLANT
KIT TURNOVER KIT B (KITS) ×6 IMPLANT
NDL HYPO 25X1 1.5 SAFETY (NEEDLE) ×4 IMPLANT
NDL SPNL 20GX3.5 QUINCKE YW (NEEDLE) ×2 IMPLANT
NEEDLE HYPO 25X1 1.5 SAFETY (NEEDLE) ×6 IMPLANT
NEEDLE SPNL 20GX3.5 QUINCKE YW (NEEDLE) ×3 IMPLANT
NS IRRIG 1000ML POUR BTL (IV SOLUTION) ×6 IMPLANT
OIL CARTRIDGE MAESTRO DRILL (MISCELLANEOUS) ×3
PACK LAMINECTOMY NEURO (CUSTOM PROCEDURE TRAY) ×3 IMPLANT
PACK SURGICAL SETUP 50X90 (CUSTOM PROCEDURE TRAY) ×3 IMPLANT
PAD ARMBOARD 7.5X6 YLW CONV (MISCELLANEOUS) ×12 IMPLANT
PIN DISTRACTION 14MM (PIN) ×6 IMPLANT
PLATE 30MM (Plate) ×1 IMPLANT
RUBBERBAND STERILE (MISCELLANEOUS) ×6 IMPLANT
SCREW CANN 4X16 SS S/DRILL (Screw) ×6 IMPLANT
SPACER PTI-C 6X16X14 7DEG (Spacer) ×1 IMPLANT
SPACER PTI-C 7X16X14 IDENTI (Spacer) ×1 IMPLANT
SPONGE INTESTINAL PEANUT (DISPOSABLE) ×3 IMPLANT
SPONGE SURGIFOAM ABS GEL SZ50 (HEMOSTASIS) ×3 IMPLANT
STOCKINETTE 4X48 STRL (DRAPES) ×4 IMPLANT
STRIP CLOSURE SKIN 1/2X4 (GAUZE/BANDAGES/DRESSINGS) ×3 IMPLANT
SUT ETHILON 4 0 PS 2 18 (SUTURE) ×5 IMPLANT
SUT VIC AB 3-0 SH 8-18 (SUTURE) ×7 IMPLANT
SUT VICRYL 4-0 PS2 18IN ABS (SUTURE) IMPLANT
SYR BULB 3OZ (MISCELLANEOUS) ×3 IMPLANT
SYR CONTROL 10ML LL (SYRINGE) ×3 IMPLANT
TOWEL GREEN STERILE (TOWEL DISPOSABLE) ×6 IMPLANT
TOWEL GREEN STERILE FF (TOWEL DISPOSABLE) ×6 IMPLANT
TUBE CONNECTING 12X1/4 (SUCTIONS) ×3 IMPLANT
UNDERPAD 30X30 (UNDERPADS AND DIAPERS) ×2 IMPLANT
WATER STERILE IRR 1000ML POUR (IV SOLUTION) ×5 IMPLANT

## 2018-03-04 NOTE — Anesthesia Postprocedure Evaluation (Signed)
Anesthesia Post Note  Patient: [name]  Procedure(s) Performed: Anterior Cervical Decompression Fusion - Cervical three-Cervical four - Cervical four-Cervical five (N/A ) Carpal Tunnel Release - left (Left )     Patient location during evaluation: PACU Anesthesia Type: General Level of consciousness: sedated Pain management: pain level controlled Vital Signs Assessment: post-procedure vital signs reviewed and stable Respiratory status: spontaneous breathing and respiratory function stable Cardiovascular status: stable Postop Assessment: no apparent nausea or vomiting Anesthetic complications: no    Last Vitals:  Vitals:   03/04/18 1121 03/04/18 1149  BP:  135/77  Pulse: 73 70  Resp: 16 18  Temp: 36.7 C 36.6 C  SpO2: 94% 93%    Last Pain:  Vitals:   03/04/18 1149  TempSrc: Oral  PainSc:                  Baily Serpe DANIEL

## 2018-03-04 NOTE — Progress Notes (Signed)
Orthopedic Tech Progress Note Patient Details:  [name] 1940-03-12 [id number] Dropped off to RN  Patient ID: [name], male   DOB: 04/09/1940, 78 y.o.   MRN: [id number]   [name] 03/04/2018, 1:13 PM

## 2018-03-04 NOTE — Transfer of Care (Signed)
Immediate Anesthesia Transfer of Care Note  Patient: [name]  Procedure(s) Performed: Anterior Cervical Decompression Fusion - Cervical three-Cervical four - Cervical four-Cervical five (N/A ) Carpal Tunnel Release - left (Left )  Patient Location: PACU  Anesthesia Type:General  Level of Consciousness: drowsy and patient cooperative  Airway & Oxygen Therapy: Patient Spontanous Breathing and Patient connected to face mask oxygen  Post-op Assessment: Report given to RN and Post -op Vital signs reviewed and stable  Post vital signs: Reviewed and stable  Last Vitals:  Vitals Value Taken Time  BP 126/83 03/04/2018 10:32 AM  Temp    Pulse 77 03/04/2018 10:36 AM  Resp 14 03/04/2018 10:36 AM  SpO2 97 % 03/04/2018 10:36 AM  Vitals shown include unvalidated device data.  Last Pain:  Vitals:   03/04/18 0559  TempSrc: Oral  PainSc: 8       Patients Stated Pain Goal: 2 (94/32/00 3794)  Complications: No apparent anesthesia complications

## 2018-03-04 NOTE — H&P (Signed)
Subjective:   Patient is a 78 y.o. male admitted for shoulder pain and hand N. The patient first presented to me with complaints of neck pain, shooting pains in the arm(s) and numbness of the arm(s). Onset of symptoms was several months ago. The pain is described as aching and occurs all day. The pain is rated severe, and is located in the neck and radiates to the shoulders. The symptoms have been progressive. Symptoms are exacerbated by extending head backwards, and are relieved by none.  Previous work up includes MRI of cervical spine, results: spinal stenosis.  Past Medical History:  Diagnosis Date  . Arthritis   . Atrial fibrillation ([place])   . Cancer ([place]) 10/2015   prostate cancer reated with radiation  . Dysrhythmia   . GERD (gastroesophageal reflux disease)   . Headache   . History of hiatal hernia   . History of kidney stones   . Hyperlipemia   . Hypertension   . PONV (postoperative nausea and vomiting)     Past Surgical History:  Procedure Laterality Date  . anal fissures    . BACK SURGERY    . CARDIAC CATHETERIZATION     no PCI  . CHOLECYSTECTOMY    . COLONOSCOPY W/ POLYPECTOMY    . EYE SURGERY     cataract removal bilateral eyes  . FOOT SURGERY     left heel  . HERNIA REPAIR    . SHOULDER SURGERY     bilateral    Allergies  Allergen Reactions  . Norco [Hydrocodone-Acetaminophen] Other (See Comments)    Confusion  . Percocet [Oxycodone-Acetaminophen] Other (See Comments)    Rectal bleeding    Social History   Tobacco Use  . Smoking status: Former Smoker    Packs/day: 0.50    Years: 35.00    Pack years: 17.50    Types: Cigarettes    Last attempt to quit: 02/10/1993    Years since quitting: 25.0  . Smokeless tobacco: Former Systems developer    Types: [place] date: 02/10/2006  Substance Use Topics  . Alcohol use: No    History reviewed. No pertinent family history. Prior to Admission medications   Medication Sig Start Date End Date Taking? Authorizing Provider   amLODipine (NORVASC) 5 MG tablet Take 5 mg by mouth daily.   Yes [provider]  aspirin EC 81 MG tablet Take 81 mg by mouth daily.   Yes [provider]  atorvastatin (LIPITOR) 40 MG tablet TAKE 1 TABLET (40 MG TOTAL) BY MOUTH DAILY AT 6 PM. 05/15/17  Yes Revankar, Reita Cliche, MD  cholecalciferol (VITAMIN D) 1000 UNITS tablet Take 1,000 Units by mouth daily.   Yes [provider]  finasteride (PROSCAR) 5 MG tablet Take 5 mg by mouth daily. 01/27/17  Yes [provider]  Glycerin-Polysorbate 80 (REFRESH DRY EYE THERAPY) 1-1 % SOLN Place 1 drop into both eyes 2 (two) times daily as needed (for dry eyes).    Yes [provider]  lisinopril (PRINIVIL,ZESTRIL) 10 MG tablet Take 10 mg by mouth daily.   Yes [provider]  Omega-3 Fatty Acids (FISH OIL PO) Take 1 capsule by mouth daily.    Yes [provider]  oxyCODONE-acetaminophen (PERCOCET/ROXICET) 5-325 MG tablet Take 1 tablet by mouth every 6 (six) hours as needed for severe pain.   Yes [provider]  pantoprazole (PROTONIX) 40 MG tablet Take 1 tablet (40 mg total) by mouth daily. 06/15/17  Yes Jackquline Denmark,  MD  tamsulosin (FLOMAX) 0.4 MG CAPS capsule Take 0.4 mg by mouth daily after supper.  01/27/17  Yes [provider]  nitroGLYCERIN (NITROSTAT) 0.4 MG SL tablet Place 1 tablet (0.4 mg total) under the tongue every 5 (five) minutes x 3 doses as needed for chest pain. 01/25/17   Leanor Kail, PA     Review of Systems  Positive ROS: neg  All other systems have been reviewed and were otherwise negative with the exception of those mentioned in the HPI and as above.  Objective: Vital signs in last 24 hours: Temp:  [97.6 F (36.4 C)] 97.6 F (36.4 C) (01/23 0559) Pulse Rate:  [64] 64 (01/23 0559) Resp:  [16] 16 (01/23 0559) BP: (144)/(81) 144/81 (01/23 0559) SpO2:  [93 %] 93 % (01/23 0559) Weight:  [87.9 kg] 87.9 kg (01/23 0559)  General Appearance:  Alert, cooperative, no distress, appears stated age Head: Normocephalic, without obvious abnormality, atraumatic Eyes: PERRL, conjunctiva/corneas clear, EOM's intact      Neck: Supple, symmetrical, trachea midline, Back: Symmetric, no curvature, ROM normal, no CVA tenderness Lungs:  respirations unlabored Heart: Regular rate and rhythm Abdomen: Soft, non-tender Extremities: Extremities normal, atraumatic, no cyanosis or edema Pulses: 2+ and symmetric all extremities Skin: Skin color, texture, turgor normal, no rashes or lesions  NEUROLOGIC:  Mental status: Alert and oriented x4, no aphasia, good attention span, fund of knowledge and memory  Motor Exam - grossly normal Sensory Exam - grossly normal Reflexes: trace Coordination - grossly normal Gait - grossly normal Balance - grossly normal Cranial Nerves: I: smell Not tested  II: visual acuity  OS: nl    OD: nl  II: visual fields Full to confrontation  II: pupils Equal, round, reactive to light  III,VII: ptosis None  III,IV,VI: extraocular muscles  Full ROM  V: mastication Normal  V: facial light touch sensation  Normal  V,VII: corneal reflex  Present  VII: facial muscle function - upper  Normal  VII: facial muscle function - lower Normal  VIII: hearing Not tested  IX: soft palate elevation  Normal  IX,X: gag reflex Present  XI: trapezius strength  5/5  XI: sternocleidomastoid strength 5/5  XI: neck flexion strength  5/5  XII: tongue strength  Normal    Data Review Lab Results  Component Value Date   WBC 8.5 02/25/2018   HGB 15.4 02/25/2018   HCT 46.9 02/25/2018   MCV 88.0 02/25/2018   PLT 244 02/25/2018   Lab Results  Component Value Date   NA 137 02/25/2018   K 4.1 02/25/2018   CL 105 02/25/2018   CO2 22 02/25/2018   BUN 19 02/25/2018   CREATININE 0.82 02/25/2018   GLUCOSE 101 (H) 02/25/2018   Lab Results  Component Value Date   INR 0.98 02/25/2018    Assessment:   Cervical neck pain with herniated  nucleus pulposus/ spondylosis/ stenosis at C3-5, and L CTS. Estimated body mass index is 26.64 kg/m as calculated from the following:   Height as of this encounter: 5' 11.5" (1.816 m).   Weight as of this encounter: 87.9 kg.  Patient has failed conservative therapy. Planned surgery : ACDF C3-4, C4-5 and L CTR  Plan:   I explained the condition and procedure to the patient and answered any questions.  Patient wishes to proceed with procedure as planned. Understands risks/ benefits/ and expected or typical outcomes.  Eustace Moore 03/04/2018 7:06 AM

## 2018-03-04 NOTE — Op Note (Signed)
03/04/2018  10:31 AM  PATIENT:  [name]  78 y.o. 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 Decompressive anterior cervical discectomy C3-4 C4-5, 2. Anterior cervical arthrodesis C3-4 C4-5 utilizing a porous titanium interbody cage packed with locally harvested morcellized autologous bone graft, 3. Anterior cervical plating C3-C5 utilizing a Alphatec plate. 4.  Left carpal tunnel release  SURGEON:  Sherley Bounds, MD  ASSISTANTS: Dr. Sherwood Gambler  ANESTHESIA:   General  EBL: 175 ml  Total I/O In: 600 [I.V.:600] Out: 175 [Blood:175]  BLOOD ADMINISTERED: none  DRAINS: none  SPECIMEN:  none  INDICATION FOR PROCEDURE: This patient presented with neck pain and shoulder pain and left hand numbness. Imaging showed focal spondylosis C3-4 C4-5.  Conduction studies confirmed a left median neuropathy the patient tried conservative measures without relief. Pain was debilitating. Recommended ACDF with plating and left carpal tunnel release. Patient understood the risks, benefits, and alternatives and potential outcomes and wished to proceed.  PROCEDURE DETAILS: Patient was brought to the operating room placed under general endotracheal anesthesia. Patient was placed in the supine position on the operating room table. The neck was prepped with Duraprep and draped in a sterile fashion.   Three cc of local anesthesia was injected and a transverse incision was made on the right side of the neck.  Dissection was carried down thru the subcutaneous tissue and the platysma was  elevated, opened, and undermined with Metzenbaum scissors.  Dissection was then carried out thru an avascular plane leaving the sternocleidomastoid carotid artery and jugular vein laterally and the trachea and esophagus medially. The ventral aspect of the vertebral column was identified  and a localizing x-ray was taken. The C4-5 level was identified. The longus colli muscles were then elevated and the retractor was placed to expose C3-4 and C4-5. The annulus was incised and the disc space entered at both levels. Discectomy was performed with micro-curettes and pituitary rongeurs. I then used the high-speed drill to drill the endplates down to the level of the posterior longitudinal ligament. The drill shavings were saved in a mucous trap for later arthrodesis. The operating microscope was draped and brought into the field provided additional magnification, illumination and visualization. Discectomy was continued posteriorly thru the disc space. Posterior longitudinal ligament was opened with a nerve hook, and then removed along with disc herniation and osteophytes, decompressing the spinal canal and thecal sac. We then continued to remove osteophytic overgrowth and disc material decompressing the neural foramina and exiting nerve roots bilaterally. The scope was angled up and down to help decompress and undercut the vertebral bodies. Once the decompression was completed we could pass a nerve hook circumferentially to assure adequate decompression in the midline and in the neural foramina. So by both visualization and palpation we felt we had an adequate decompression of the neural elements. We then measured the height of the intravertebral disc space and selected a 7 millimeter porous titanium interbody cage packed with autograft. It was then gently positioned in the intravertebral disc space(s) and countersunk. I then used a Alphatec plate and placed variable angle screws into the vertebral bodies of each level and locked them into position. The wound was irrigated with bacitracin solution, checked for hemostasis which was established and confirmed. Once meticulous hemostasis was achieved, we then proceeded with closure. The platysma was closed with interrupted 3-0 undyed Vicryl suture, the  subcuticular  layer was closed with interrupted 3-0 undyed Vicryl suture. The skin edges were approximated with steristrips. The drapes were removed. A sterile dressing was applied.  At the end of the procedure all sponge, needle and instrument counts were correct.  The patient's left arm was then extended on an armboard and prepped circumferentially from the fingertips to the elbows with DuraPrep.  The hand was then draped in the usual sterile fashion.  6 cc of local anesthesia was injected into the palm and a small palmar incision was made from the distal wrist crease into the palm toward the middle finger.  We dissected down through the palmar fascia and identified the transverse carpal ligament was opened with a 15 blade scalpel.  Then spread between the ligament and the nerve with a mosquito and completely transected the ligament both proximally and distally decompress the median nerve.  We then palpated proximally and distally with a mosquito.  The nerve appeared to be free.  We dried all bleeding points.  We irrigated.  We closed the fascia with a single 3-0 Vicryl.  We closed the subcutaneous tissues with 3-0 Vicryl.  We closed the skin with interrupted 4-0 Ethilon vertical mattress sutures.  The incision was covered with antibiotic ointment and Vaseline gauze and the hand was then wrapped with a Kerlix and Ace bandage.  The patient was then awakened from general anesthesia and transported to the recovery room in stable condition.  At the end of the procedure all sponge needle and instrument counts were correct.   PLAN OF CARE: Admit to inpatient   PATIENT DISPOSITION:  PACU - hemodynamically stable.   Delay start of Pharmacological VTE agent (>24hrs) due to surgical blood loss or risk of bleeding:  yes

## 2018-03-04 NOTE — Anesthesia Procedure Notes (Signed)
Procedure Name: Intubation Date/Time: 03/04/2018 7:45 AM Performed by: Gwyndolyn Saxon, CRNA Pre-anesthesia Checklist: Patient identified, Emergency Drugs available, Suction available and Patient being monitored Patient Re-evaluated:Patient Re-evaluated prior to induction Oxygen Delivery Method: Circle system utilized Preoxygenation: Pre-oxygenation with 100% oxygen Induction Type: IV induction Ventilation: Mask ventilation without difficulty Laryngoscope Size: Miller and 2 Grade View: Grade I Tube type: Oral Tube size: 7.5 mm Number of attempts: 1 Airway Equipment and Method: Patient positioned with wedge pillow and Stylet Placement Confirmation: ETT inserted through vocal cords under direct vision,  positive ETCO2 and breath sounds checked- equal and bilateral Secured at: 22 cm Tube secured with: Tape Dental Injury: Teeth and Oropharynx as per pre-operative assessment

## 2018-03-05 ENCOUNTER — Encounter (HOSPITAL_COMMUNITY): Payer: Self-pay | Admitting: Neurological Surgery

## 2018-03-05 MED ORDER — METHOCARBAMOL 500 MG PO TABS: 500.0000 mg | ORAL_TABLET | Freq: Four times a day (QID) | ORAL | 0 refills | Status: DC | PRN

## 2018-03-05 MED FILL — Thrombin For Soln 5000 Unit: CUTANEOUS | Qty: 5000 | Status: AC

## 2018-03-05 MED FILL — Gelatin Absorbable MT Powder: OROMUCOSAL | Qty: 1 | Status: AC

## 2018-03-05 NOTE — Discharge Summary (Signed)
Physician Discharge Summary  Patient ID: [name] MRN: [id number] DOB/AGE: Aug 29, 1940 78 y.o.  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 The patient tolerated the procedure well and was taken to the recovery room and then to the floor in stable condition. The hospital course was routine. There were no complications. The wound remained clean dry and intact. Pt had appropriate neck soreness. No complaints of arm pain or new N/T/W. The patient remained afebrile with stable vital signs, and tolerated a regular diet. The patient continued to increase activities, and pain was well controlled with oral pain medications.   Consults: None  Significant Diagnostic Studies:  Results for orders placed or performed during the hospital encounter of 02/25/18  Surgical pcr screen  Result Value Ref Range   MRSA, PCR NEGATIVE NEGATIVE   Staphylococcus aureus NEGATIVE NEGATIVE  Basic metabolic panel  Result Value Ref Range   Sodium 137 135 - 145 mmol/L   Potassium 4.1 3.5 - 5.1 mmol/L   Chloride 105 98 - 111 mmol/L   CO2 22 22 - 32 mmol/L   Glucose, Bld 101 (H) 70 - 99 mg/dL   BUN 19 8 - 23 mg/dL   Creatinine, Ser 0.82 0.61 - 1.24 mg/dL   Calcium 9.9 8.9 - 10.3 mg/dL   GFR calc non Af Amer >60 >60 mL/min   GFR calc Af Amer >60 >60 mL/min   Anion gap 10 5 - 15  CBC WITH DIFFERENTIAL  Result Value Ref Range   WBC 8.5 4.0 - 10.5 K/uL   RBC 5.33 4.22 - 5.81 MIL/uL   Hemoglobin 15.4 13.0 - 17.0 g/dL   HCT 46.9 39.0 - 52.0 %   MCV 88.0 80.0 - 100.0 fL   MCH 28.9 26.0 - 34.0 pg   MCHC 32.8 30.0 - 36.0 g/dL   RDW 13.2 11.5 - 15.5 %   Platelets 244 150 - 400  K/uL   nRBC 0.0 0.0 - 0.2 %   Neutrophils Relative % 76 %   Neutro Abs 6.4 1.7 - 7.7 K/uL   Lymphocytes Relative 15 %   Lymphs Abs 1.2 0.7 - 4.0 K/uL   Monocytes Relative 9 %   Monocytes Absolute 0.8 0.1 - 1.0 K/uL   Eosinophils Relative 0 %   Eosinophils Absolute 0.0 0.0 - 0.5 K/uL   Basophils Relative 0 %   Basophils Absolute 0.0 0.0 - 0.1 K/uL   Immature Granulocytes 0 %   Abs Immature Granulocytes 0.03 0.00 - 0.07 K/uL  Protime-INR  Result Value Ref Range   Prothrombin Time 12.9 11.4 - 15.2 seconds   INR 0.98     Chest 2 View  Result Date: 02/25/2018 CLINICAL DATA:  Preoperative evaluation for upcoming surgery EXAM: CHEST - 2 VIEW COMPARISON:  08/16/2017 FINDINGS: Cardiac shadows within normal limits. Mild aortic calcifications are again seen. The lungs are clear. Degenerative changes of the thoracic spine are noted. IMPRESSION: No acute abnormality noted. Electronically Signed   By: Inez Catalina M.D.   On: 02/25/2018 14:00   Dg Cervical Spine 2-3 Views  Result Date: 03/04/2018 CLINICAL DATA:  C3-5 ACDF 11 seconds. Images: 1 EXAM: DG C-ARM 61-120 MIN; CERVICAL SPINE - 2-3 VIEW  COMPARISON:  None. FINDINGS: The patient is status post ACDF of C3, C4, and C5. The anterior plate and screws are in good position as are disc spacer devices. IMPRESSION: ACDF of C3 through C5 as above. Electronically Signed   By: Dorise Bullion III M.D   On: 03/04/2018 10:58   Dg C-arm 1-60 Min  Result Date: 03/04/2018 CLINICAL DATA:  C3-5 ACDF 11 seconds. Images: 1 EXAM: DG C-ARM 61-120 MIN; CERVICAL SPINE - 2-3 VIEW COMPARISON:  None. FINDINGS: The patient is status post ACDF of C3, C4, and C5. The anterior plate and screws are in good position as are disc spacer devices. IMPRESSION: ACDF of C3 through C5 as above. Electronically Signed   By: Dorise Bullion III M.D   On: 03/04/2018 10:58    Antibiotics:  Anti-infectives (From admission, onward)   Start     Dose/Rate Route Frequency Ordered Stop    03/04/18 1600  ceFAZolin (ANCEF) IVPB 2g/100 mL premix     2 g 200 mL/hr over 30 Minutes Intravenous Every 8 hours 03/04/18 1152 03/05/18 0017   03/04/18 0719  bacitracin 50,000 Units in sodium chloride 0.9 % 500 mL irrigation  Status:  Discontinued       As needed 03/04/18 0820 03/04/18 1028   03/04/18 0600  ceFAZolin (ANCEF) IVPB 2g/100 mL premix     2 g 200 mL/hr over 30 Minutes Intravenous On call to O.R. 03/04/18 0548 03/04/18 0815      Discharge Exam: Blood pressure (!) 151/87, pulse 75, temperature 97.8 F (36.6 C), temperature source Oral, resp. rate 18, height 5' 11.5" (1.816 m), weight 87.9 kg, SpO2 93 %. Neurologic: Grossly normal Ambulating and voiding well, incision cdi  Discharge Medications:   Allergies as of 03/05/2018      Reactions   Norco [hydrocodone-acetaminophen] Other (See Comments)   Confusion      Medication List    TAKE these medications   amLODipine 5 MG tablet Commonly known as:  NORVASC Take 5 mg by mouth daily.   aspirin EC 81 MG tablet Take 81 mg by mouth daily.   atorvastatin 40 MG tablet Commonly known as:  LIPITOR TAKE 1 TABLET (40 MG TOTAL) BY MOUTH DAILY AT 6 PM.   cholecalciferol 1000 units tablet Commonly known as:  VITAMIN D Take 1,000 Units by mouth daily.   finasteride 5 MG tablet Commonly known as:  PROSCAR Take 5 mg by mouth daily.   FISH OIL PO Take 1 capsule by mouth daily.   lisinopril 10 MG tablet Commonly known as:  PRINIVIL,ZESTRIL Take 10 mg by mouth daily.   methocarbamol 500 MG tablet Commonly known as:  ROBAXIN Take 1 tablet (500 mg total) by mouth every 6 (six) hours as needed for muscle spasms.   nitroGLYCERIN 0.4 MG SL tablet Commonly known as:  NITROSTAT Place 1 tablet (0.4 mg total) under the tongue every 5 (five) minutes x 3 doses as needed for chest pain.   oxyCODONE-acetaminophen 5-325 MG tablet Commonly known as:  PERCOCET/ROXICET Take 1 tablet by mouth every 6 (six) hours as needed for severe  pain.   pantoprazole 40 MG tablet Commonly known as:  PROTONIX Take 1 tablet (40 mg total) by mouth daily.   REFRESH DRY EYE THERAPY 1-1 % Soln Generic drug:  Glycerin-Polysorbate 80 Place 1 drop into both eyes 2 (two) times daily as needed (for dry eyes).   tamsulosin 0.4 MG Caps capsule Commonly known as:  FLOMAX Take 0.4 mg by mouth daily  after supper.       Disposition: home   Final Dx: acdf C3-4, 4-5 and left CTR  Discharge Instructions     Remove dressing in 72 hours   Complete by:  As directed    Call MD for:  difficulty breathing, headache or visual disturbances   Complete by:  As directed    Call MD for:  hives   Complete by:  As directed    Call MD for:  persistant dizziness or light-headedness   Complete by:  As directed    Call MD for:  persistant nausea and vomiting   Complete by:  As directed    Call MD for:  redness, tenderness, or signs of infection (pain, swelling, redness, odor or green/yellow discharge around incision site)   Complete by:  As directed    Call MD for:  severe uncontrolled pain   Complete by:  As directed    Call MD for:  temperature >100.4   Complete by:  As directed    Diet - low sodium heart healthy   Complete by:  As directed    Driving Restrictions   Complete by:  As directed    No driving for 2 weeks, no riding in the car for 1 week   Increase activity slowly   Complete by:  As directed          Signed: Ocie Cornfield [place] 03/05/2018, 9:57 AM

## 2018-03-05 NOTE — Progress Notes (Signed)
Patient alert and oriented, mae's well, voiding adequate amount of urine, swallowing without difficulty, no c/o pain at time of discharge. Patient discharged home with family. Script and discharged instructions given to patient. Patient and family stated understanding of instructions given. Patient has an appointment with Dr. Jones °

## 2018-11-21 DIAGNOSIS — R197 Diarrhea, unspecified: Secondary | ICD-10-CM

## 2018-11-21 HISTORY — DX: Diarrhea, unspecified: R19.7

## 2018-11-22 DIAGNOSIS — A419 Sepsis, unspecified organism: Secondary | ICD-10-CM | POA: Insufficient documentation

## 2018-11-22 DIAGNOSIS — K529 Noninfective gastroenteritis and colitis, unspecified: Secondary | ICD-10-CM

## 2018-11-22 HISTORY — DX: Noninfective gastroenteritis and colitis, unspecified: K52.9

## 2018-11-22 HISTORY — DX: Sepsis, unspecified organism: A41.9

## 2019-01-18 IMAGING — CR DG CHEST 2V
2 series · 2 of 2 positions shown · non-contrast
Comparison: 01/23/2017

CLINICAL DATA: Chest pain

EXAM:
CHEST  2 VIEW

[chest lat]
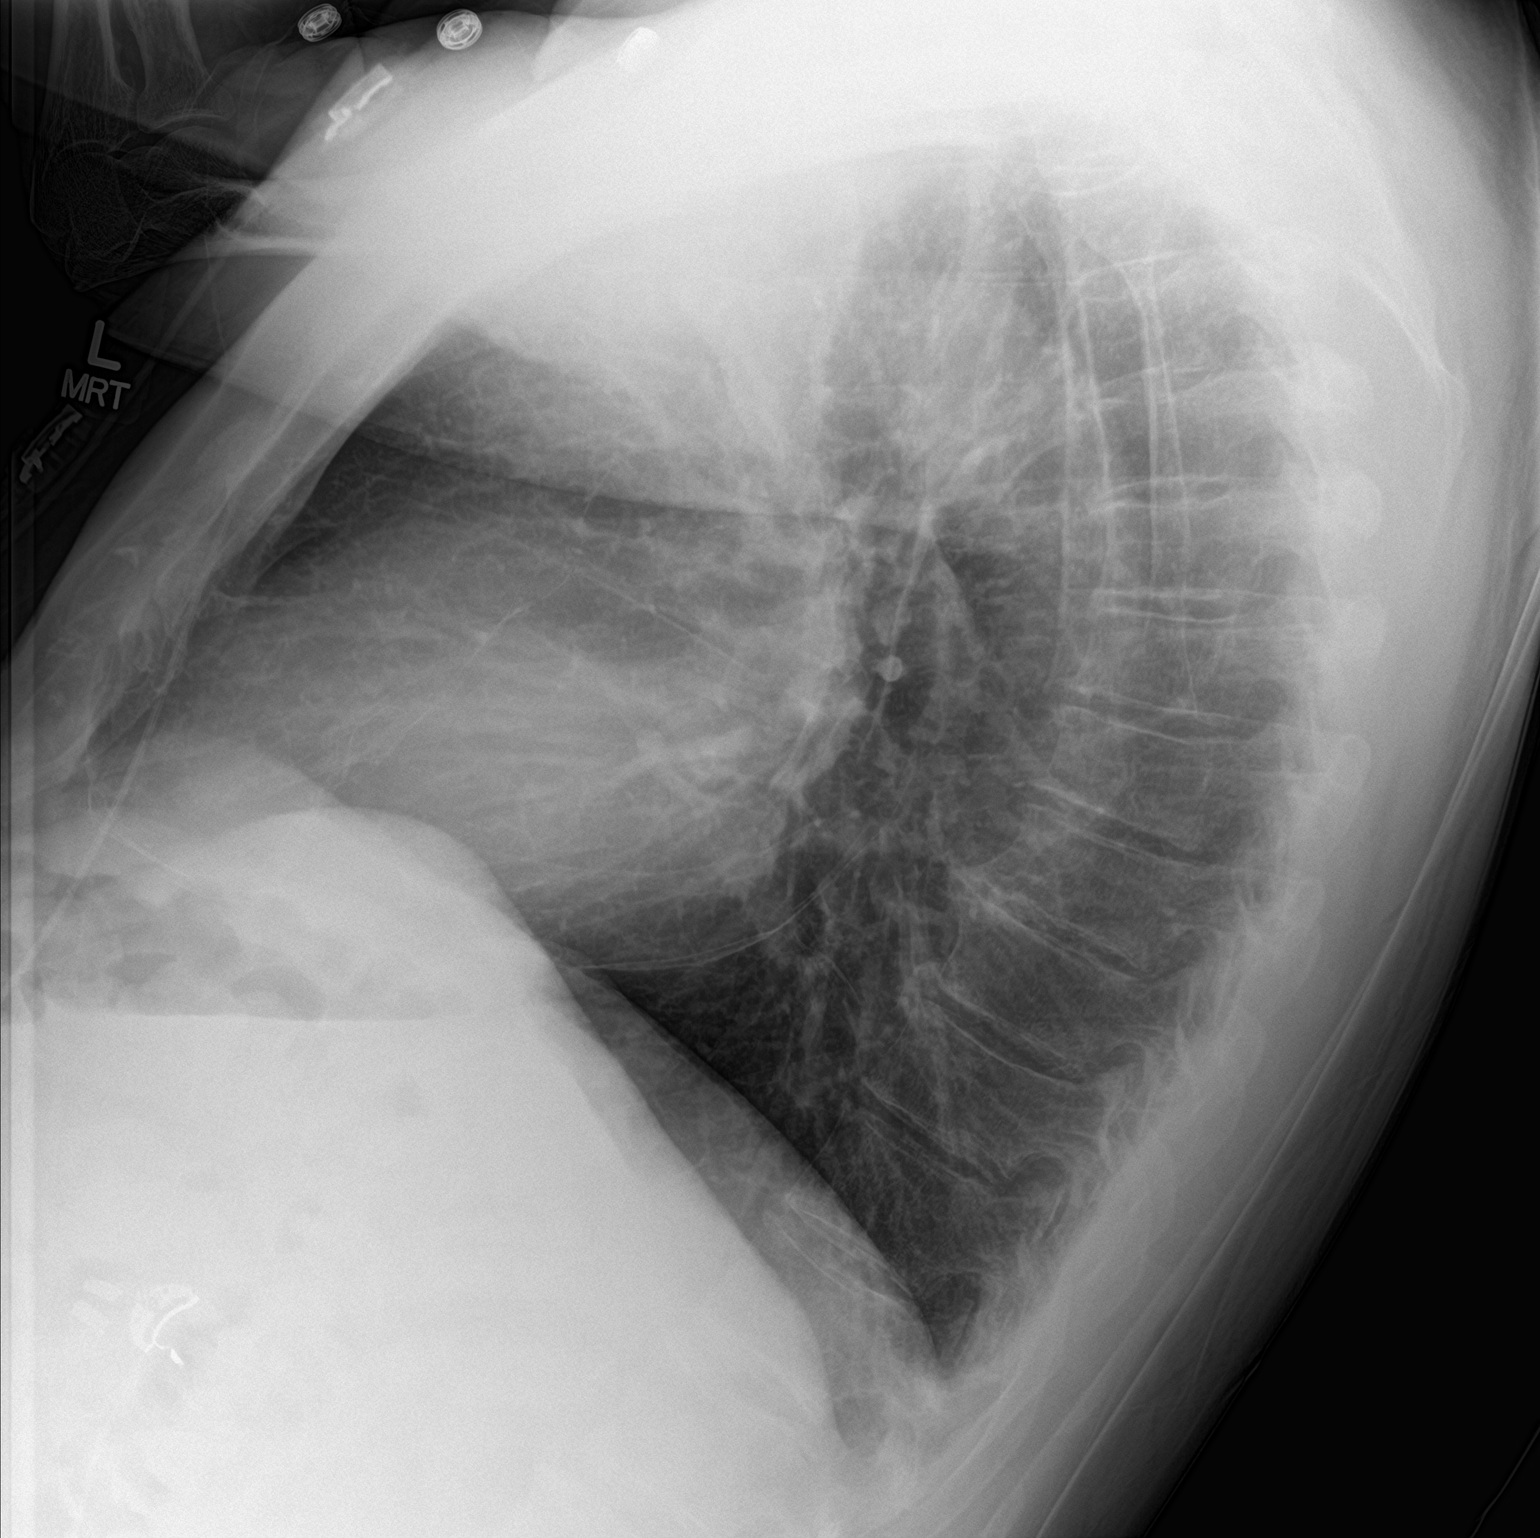

[chest ap]
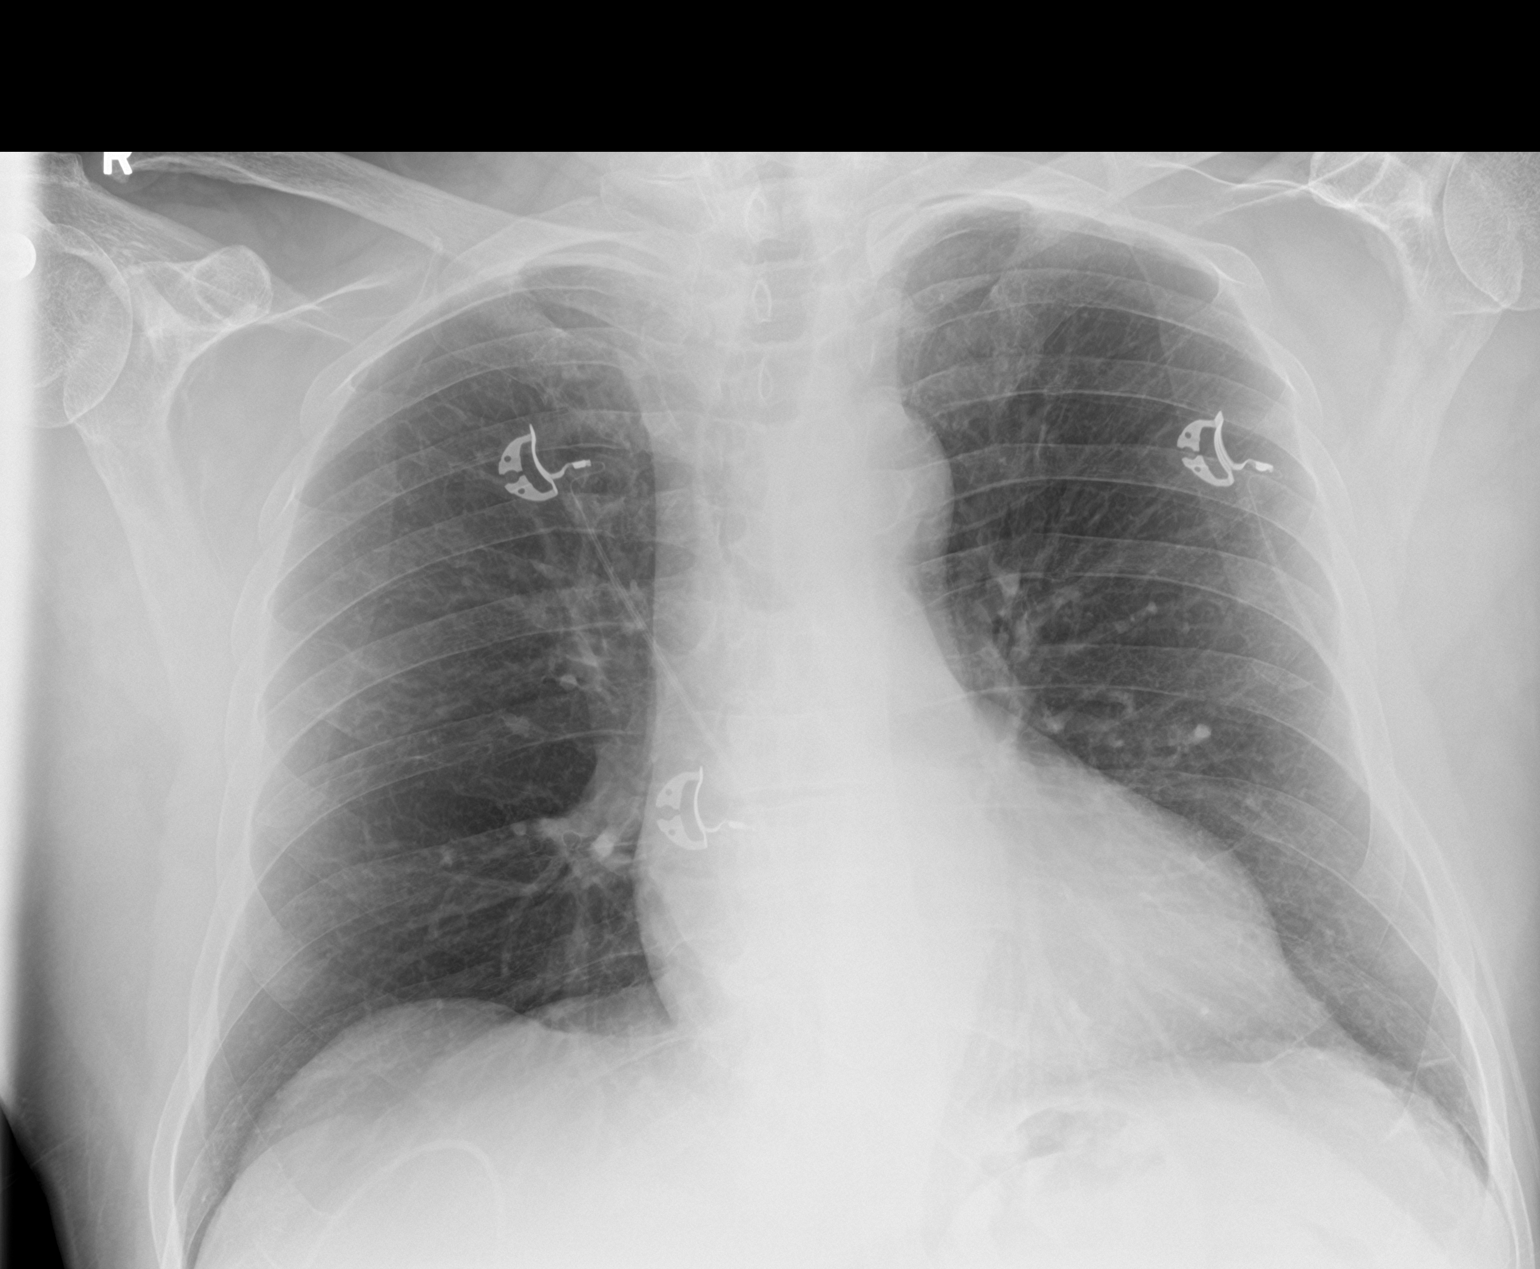

[2 of 2 positions shown; findings below may reference images not displayed]

FINDINGS: Cardiac shadow is within normal limits. The lungs are well aerated
bilaterally. No focal infiltrate or sizable effusion is seen. No
acute bony abnormality is noted.
IMPRESSION: No active cardiopulmonary disease.

## 2019-03-14 ENCOUNTER — Encounter: Payer: Self-pay | Admitting: Gastroenterology

## 2019-03-22 ENCOUNTER — Encounter: Payer: Self-pay | Admitting: Gastroenterology

## 2019-03-28 ENCOUNTER — Encounter: Payer: Self-pay | Admitting: Gastroenterology

## 2019-03-29 ENCOUNTER — Ambulatory Visit: Payer: Medicare Other | Admitting: Gastroenterology

## 2019-03-29 ENCOUNTER — Encounter: Payer: Self-pay | Admitting: Gastroenterology

## 2019-03-29 ENCOUNTER — Other Ambulatory Visit: Payer: Self-pay

## 2019-03-29 VITALS — BP 136/84 | HR 89 | Temp 97.5°F | Ht 71.0 in | Wt 207.2 lb

## 2019-03-29 DIAGNOSIS — Z01818 Encounter for other preprocedural examination: Secondary | ICD-10-CM | POA: Diagnosis not present

## 2019-03-29 DIAGNOSIS — Z860100 Personal history of colon polyps, unspecified: Secondary | ICD-10-CM

## 2019-03-29 DIAGNOSIS — R160 Hepatomegaly, not elsewhere classified: Secondary | ICD-10-CM | POA: Diagnosis not present

## 2019-03-29 DIAGNOSIS — R195 Other fecal abnormalities: Secondary | ICD-10-CM | POA: Diagnosis not present

## 2019-03-29 MED ORDER — CLENPIQ 10-3.5-12 MG-GM -GM/160ML PO SOLN: 1.0000 | Freq: Once | ORAL | 0 refills | Status: AC

## 2019-03-29 NOTE — Progress Notes (Signed)
Chief Complaint: abn CT/heme positive stools  Referring Provider:  Rochel Brome, MD      ASSESSMENT AND PLAN;   #1. Heme positive stools with abn CT. CT AP 11/2018 showed Wall thickening of the terminal ileum and right colon.  #2. H/O polyps. FH of colon cancer (brother at age 81) #3. GERD, well controlled with PPIs. #4. Borderline thrombocytopenia with fatty liver on exam. R/O portal hypertension. Nl LFTs.   Plan: - Proceed with colonoscopy. Discussed risks & benefits. (Risks including rare perforation req laparotomy, bleeding after bx/polypectomy req blood transfusion, rarely missing neoplasms, risks of anesthesia/sedation). Benefits outweigh the risks. Patient agrees to proceed. All the questions were answered. Consent forms given for review. -[place] complete -Lose weight (6lb over 3 months).  Discussed with patient's daughter in detail.    HPI:    [name] is a 79 y.o. male  Admitted to [facility] with acute gastroenteritis with syncope October 2020.  Stool studies were negative.  Underwent CT scan Abdo/pelvis which showed TI thickening and right colonic wall thickening.  He has done well since.  Had heme positive stools and was advised to get colonoscopy in 3 to 6 months.  Feels significantly better.  No nausea, vomiting, heartburn, regurgitation, odynophagia or dysphagia.  No significant diarrhea or constipation.  No melena or hematochezia. No unintentional weight loss. No abdominal pain.  Also found to have borderline platelet count.  CT did not show any evidence of portal hypertension.  Spleen was normal according to the report.  No history of itching, skin lesions, easy bruisability, intake of over-the-counter medications including diet pills, herbal medications, anabolic steroids or Tylenol.  There is no history of blood transfusions, IV drug use or family history of liver disease.  No jaundice dark urine or pale stools.  No history of alcohol  abuse.  Has gained 14 pounds since January 2020.  Wt Readings from Last 3 Encounters:  03/29/19 207 lb 4 oz (94 kg)  03/04/18 193 lb 11.2 oz (87.9 kg)  02/25/18 193 lb 11.2 oz (87.9 kg)     Past GI procedures: -EGD 07/2016: Schatzki's ring s/p dil 50Fr, small hiatal hernia, mild gastritis. Bx- neg for EoE, CLO neg. -Colonoscopy 08/2012 ([facility]) 6 mm polyp SP polypectomy, pancolonic diverticulosis predominantly in the sigmoid colon, moderate internal hemorrhoids, previous fissurectomy. Bx- TA. -CT AP 11/2018: Wall thickening TI and right colon is noted concerning for inflammatory bowel disease such as Crohn's disease. Infectious colitis cannot be excluded. Small nonobstructive right renal calculus. No hydronephrosis or renal obstruction is noted. Sigmoid diverticulosis without inflammation. Aortic Atherosclerosis  Past Medical History:  Diagnosis Date  . Arthritis   . Atrial fibrillation ([place])   . Cancer ([place]) 10/2015   prostate cancer reated with radiation  . Dysrhythmia   . GERD (gastroesophageal reflux disease)   . Headache   . History of hiatal hernia   . History of kidney stones   . Hyperlipemia   . Hypertension   . PONV (postoperative nausea and vomiting)     Past Surgical History:  Procedure Laterality Date  . anal fissures    . ANTERIOR CERVICAL DECOMP/DISCECTOMY FUSION N/A 03/04/2018   Procedure: Anterior Cervical Decompression Fusion - Cervical three-Cervical four - Cervical four-Cervical five;  Surgeon: Eustace Moore, MD;  Location: [place];  Service: Neurosurgery;  Laterality: N/A;  . BACK SURGERY    . CARDIAC CATHETERIZATION     no PCI  . CARPAL TUNNEL RELEASE Left 03/04/2018  Procedure: Carpal Tunnel Release - left;  Surgeon: Eustace Moore, MD;  Location: [place];  Service: Neurosurgery;  Laterality: Left;  . CHOLECYSTECTOMY    . COLONOSCOPY W/ POLYPECTOMY  08/30/2012   Colonic polyp status post polypectomy.  Pancolonic diverticulosis predominantly in the sigmoid  colon. Small internal hemorrhoids. Moderate internal hemorhroids.   . ESOPHAGOGASTRODUODENOSCOPY  08/04/2016   Schatzkis ring status post esophageal dilatation. Small hiatal hernia. Mild gastritis.   Marland Kitchen EYE SURGERY     cataract removal bilateral eyes  . FOOT SURGERY     left heel  . HERNIA REPAIR    . SHOULDER SURGERY     bilateral    Family History  Problem Relation Age of Onset  . Colon cancer Neg Hx   . Esophageal cancer Neg Hx     Social History   Tobacco Use  . Smoking status: Former Smoker    Packs/day: 0.50    Years: 35.00    Pack years: 17.50    Types: Cigarettes    Quit date: 02/10/1993    Years since quitting: 26.1  . Smokeless tobacco: Former Systems developer    Types: [place] date: 02/10/2006  Substance Use Topics  . Alcohol use: No  . Drug use: No    Current Outpatient Medications  Medication Sig Dispense Refill  . aspirin EC 81 MG tablet Take 81 mg by mouth daily.    Marland Kitchen atorvastatin (LIPITOR) 40 MG tablet TAKE 1 TABLET (40 MG TOTAL) BY MOUTH DAILY AT 6 PM. 90 tablet 2  . cholecalciferol (VITAMIN D) 1000 UNITS tablet Take 1,000 Units by mouth daily.    . Glycerin-Polysorbate 80 (REFRESH DRY EYE THERAPY) 1-1 % SOLN Place 1 drop into both eyes 2 (two) times daily as needed (for dry eyes).     Marland Kitchen lisinopril (PRINIVIL,ZESTRIL) 10 MG tablet Take 10 mg by mouth daily.    . Omega-3 Fatty Acids (FISH OIL PO) Take 1 capsule by mouth daily.     . pantoprazole (PROTONIX) 40 MG tablet Take 1 tablet (40 mg total) by mouth daily. 30 tablet 6  . tamsulosin (FLOMAX) 0.4 MG CAPS capsule Take 0.4 mg by mouth daily after supper.   1  . vitamin B-12 (CYANOCOBALAMIN) 500 MCG tablet Take 1 tablet by mouth daily.    . nitroGLYCERIN (NITROSTAT) 0.4 MG SL tablet Place 1 tablet (0.4 mg total) under the tongue every 5 (five) minutes x 3 doses as needed for chest pain. (Patient not taking: Reported on 03/29/2019) 25 tablet 12   No current facility-administered medications for this visit.     Allergies  Allergen Reactions  . Norco [Hydrocodone-Acetaminophen] Other (See Comments)    Confusion    Review of Systems:  Constitutional: Denies fever, chills, diaphoresis, appetite change and fatigue.  HEENT: Denies photophobia, eye pain, redness, hearing loss, ear pain, congestion, sore throat, rhinorrhea, sneezing, mouth sores, neck pain, neck stiffness and tinnitus.   Respiratory: Denies SOB, DOE, cough, chest tightness,  and wheezing.   Cardiovascular: Denies chest pain, palpitations and leg swelling.  Genitourinary: Denies dysuria, urgency, frequency, hematuria, flank pain and difficulty urinating.  Musculoskeletal: Denies myalgias, back pain, joint swelling, arthralgias and gait problem.  Skin: No rash.  Neurological: Denies dizziness, seizures, syncope, weakness, light-headedness, numbness and headaches.  Hematological: Denies adenopathy. Easy bruising, personal or family bleeding history  Psychiatric/Behavioral: No anxiety or depression     Physical Exam:    BP 136/84   Pulse 89   Temp Marland Kitchen)  97.5 F (36.4 C)   Ht 5\' 11"  (1.803 m)   Wt 207 lb 4 oz (94 kg)   BMI 28.91 kg/m  Wt Readings from Last 3 Encounters:  03/29/19 207 lb 4 oz (94 kg)  03/04/18 193 lb 11.2 oz (87.9 kg)  02/25/18 193 lb 11.2 oz (87.9 kg)   Constitutional:  Well-developed, in no acute distress. Psychiatric: Normal mood and affect. Behavior is normal. HEENT: Pupils normal.  Conjunctivae are normal. No scleral icterus. Neck supple.  Cardiovascular: Normal rate, regular rhythm. No edema Pulmonary/chest: Effort normal and breath sounds normal. No wheezing, rales or rhonchi. Abdominal: Soft, nondistended. Nontender. Bowel sounds active throughout. There are no masses palpable.  Liver palpated 6 cm below the costal margin.  Nontender. Rectal:  defered Neurological: Alert and oriented to person place and time. Skin: Skin is warm and dry. No rashes noted.  Data Reviewed: I have personally reviewed  following labs and imaging studies  CBC: CBC Latest Ref Rng & Units 02/25/2018 08/16/2017 01/24/2017  WBC 4.0 - 10.5 K/uL 8.5 6.4 5.9  Hemoglobin 13.0 - 17.0 g/dL 15.4 17.4(H) 13.9  Hematocrit 39.0 - 52.0 % 46.9 49.0 40.5  Platelets 150 - 400 K/uL 244 141(L) 159    CMP: CMP Latest Ref Rng & Units 02/25/2018 08/16/2017 01/24/2017  Glucose 70 - 99 mg/dL 101(H) 120(H) 112(H)  BUN 8 - 23 mg/dL 19 22 20   Creatinine 0.61 - 1.24 mg/dL 0.82 0.95 0.91  Sodium 135 - 145 mmol/L 137 136 136  Potassium 3.5 - 5.1 mmol/L 4.1 3.8 3.8  Chloride 98 - 111 mmol/L 105 103 106  CO2 22 - 32 mmol/L 22 23 22   Calcium 8.9 - 10.3 mg/dL 9.9 8.5(L) 8.7(L)  Total Protein 6.5 - 8.1 g/dL - 7.7 -  Total Bilirubin 0.3 - 1.2 mg/dL - 1.3(H) -  Alkaline Phos 38 - 126 U/L - 85 -  AST 15 - 41 U/L - 37 -  ALT 0 - 44 U/L - 46(H) -   Platelet count: 144 to 124 to 110 to 132   11/2018  Discussed in detail with the patient and patient's daughter. Extensive notes were reviewed.    Carmell Austria, MD 03/29/2019, 2:36 PM  Cc: Rochel Brome, MD

## 2019-03-29 NOTE — Patient Instructions (Addendum)
If you are age 81 or older, your body mass index should be between 23-30. Your Body mass index is 28.91 kg/m. If this is out of the aforementioned range listed, please consider follow up with your Primary Care Provider.  If you are age 75 or younger, your body mass index should be between 19-25. Your Body mass index is 28.91 kg/m. If this is out of the aformentioned range listed, please consider follow up with your Primary Care Provider.   You have been scheduled for a colonoscopy. Please follow written instructions given to you at your visit today.  Please pick up your prep supplies at the pharmacy within the next 1-3 days. If you use inhalers (even only as needed), please bring them with you on the day of your procedure. Your physician has requested that you go to www.startemmi.com and enter the access code given to you at your visit today. This web site gives a general overview about your procedure. However, you should still follow specific instructions given to you by our office regarding your preparation for the procedure.  We have given you samples of the following medication to take: Clenpiq  You have been scheduled for an abdominal ultrasound at [facility] (1st floor ) on 04/13/19 at 9am. Please arrive 15 minutes prior to your appointment for registration. Make certain not to have anything to eat or drink 6 hours prior to your appointment. Should you need to reschedule your appointment, please contact radiology at [phone]. This test typically takes about 30 minutes to perform.   Thank you,  Dr. Jackquline Denmark

## 2019-03-30 ENCOUNTER — Encounter: Payer: Self-pay | Admitting: Gastroenterology

## 2019-04-01 ENCOUNTER — Ambulatory Visit (INDEPENDENT_AMBULATORY_CARE_PROVIDER_SITE_OTHER): Payer: Medicare PPO

## 2019-04-01 DIAGNOSIS — Z1159 Encounter for screening for other viral diseases: Secondary | ICD-10-CM | POA: Diagnosis not present

## 2019-04-02 LAB — SARS CORONAVIRUS 2 (TAT 6-24 HRS): SARS Coronavirus 2: NEGATIVE

## 2019-04-04 DIAGNOSIS — C44311 Basal cell carcinoma of skin of nose: Secondary | ICD-10-CM | POA: Diagnosis not present

## 2019-04-04 DIAGNOSIS — L821 Other seborrheic keratosis: Secondary | ICD-10-CM | POA: Diagnosis not present

## 2019-04-04 DIAGNOSIS — F32A Depression, unspecified: Secondary | ICD-10-CM | POA: Insufficient documentation

## 2019-04-04 DIAGNOSIS — F329 Major depressive disorder, single episode, unspecified: Secondary | ICD-10-CM | POA: Insufficient documentation

## 2019-04-04 DIAGNOSIS — I480 Paroxysmal atrial fibrillation: Secondary | ICD-10-CM

## 2019-04-04 DIAGNOSIS — E7800 Pure hypercholesterolemia, unspecified: Secondary | ICD-10-CM | POA: Insufficient documentation

## 2019-04-04 DIAGNOSIS — L570 Actinic keratosis: Secondary | ICD-10-CM | POA: Diagnosis not present

## 2019-04-04 HISTORY — DX: Pure hypercholesterolemia, unspecified: E78.00

## 2019-04-04 HISTORY — DX: Depression, unspecified: F32.A

## 2019-04-04 HISTORY — DX: Paroxysmal atrial fibrillation: I48.0

## 2019-04-05 ENCOUNTER — Ambulatory Visit (AMBULATORY_SURGERY_CENTER): Payer: Medicare PPO | Admitting: Gastroenterology

## 2019-04-05 ENCOUNTER — Encounter: Payer: Self-pay | Admitting: Gastroenterology

## 2019-04-05 ENCOUNTER — Other Ambulatory Visit: Payer: Self-pay

## 2019-04-05 VITALS — BP 109/66 | HR 65 | Temp 97.1°F | Resp 19 | Ht 71.0 in | Wt 207.4 lb

## 2019-04-05 DIAGNOSIS — K5730 Diverticulosis of large intestine without perforation or abscess without bleeding: Secondary | ICD-10-CM | POA: Diagnosis not present

## 2019-04-05 DIAGNOSIS — R195 Other fecal abnormalities: Secondary | ICD-10-CM | POA: Diagnosis not present

## 2019-04-05 DIAGNOSIS — D122 Benign neoplasm of ascending colon: Secondary | ICD-10-CM | POA: Diagnosis not present

## 2019-04-05 DIAGNOSIS — K648 Other hemorrhoids: Secondary | ICD-10-CM

## 2019-04-05 DIAGNOSIS — Z86010 Personal history of colonic polyps: Secondary | ICD-10-CM

## 2019-04-05 MED ORDER — SODIUM CHLORIDE 0.9 % IV SOLN: 500.0000 mL | Freq: Once | INTRAVENOUS | Status: DC

## 2019-04-05 NOTE — Op Note (Signed)
[facility] Patient Name: [name] Procedure Date: 04/05/2019 9:14 AM MRN: [id number] Endoscopist: Jackquline Denmark , MD Age: 79 Referring MD:  Date of Birth: 1940-10-07 Gender: Male Account #: [id number] Procedure:                Colonoscopy Indications:              Heme positive stool Medicines:                Monitored Anesthesia Care Procedure:                Pre-Anesthesia Assessment:                           - Prior to the procedure, a History and Physical                            was performed, and patient medications and                            allergies were reviewed. The patient's tolerance of                            previous anesthesia was also reviewed. The risks                            and benefits of the procedure and the sedation                            options and risks were discussed with the patient.                            All questions were answered, and informed consent                            was obtained. Prior Anticoagulants: The patient has                            taken no previous anticoagulant or antiplatelet                            agents. ASA Grade Assessment: II - A patient with                            mild systemic disease. After reviewing the risks                            and benefits, the patient was deemed in                            satisfactory condition to undergo the procedure.                           After obtaining informed consent, the colonoscope  was passed under direct vision. Throughout the                            procedure, the patient's blood pressure, pulse, and                            oxygen saturations were monitored continuously. The                            Colonoscope was introduced through the anus and                            advanced to the 1 cm into the ileum. The                            colonoscopy was performed without difficulty. The                             patient tolerated the procedure well. The quality                            of the bowel preparation was good. The terminal                            ileum, ileocecal valve, appendiceal orifice, and                            rectum were photographed. Scope In: 9:23:03 AM Scope Out: 9:56:53 AM Scope Withdrawal Time: 0 hours 31 minutes 0 seconds  Total Procedure Duration: 0 hours 33 minutes 50 seconds  Findings:                 A 15 mm sessile polyp with frond like surface was                            found in the distal ascending colon. The polyp was                            sessile. The polyp was removed with a hot snare                            using a piecemeal technique. Resection and                            retrieval were complete. Estimated blood loss: none.                           A 10 mm polyp sessile was found in the hepatic                            flexure, 2 folds distal to above polyp. The polyp  was removed with difficulty d/t the location of                            polyp as scope kept falling back. The polypectomy                            was achieved by changing position of the patient to                            supine. Resection and retrieval appeared to be                            complete. Estimated blood loss: none. The procedure                            was prolonged.                           Multiple small-mouthed diverticula were found in                            the sigmoid colon and descending colon.                           Non-bleeding internal hemorrhoids were found during                            retroflexion. The hemorrhoids were small.                           The exam was otherwise without abnormality on                            direct and retroflexion views. Complications:            No immediate complications. Estimated Blood Loss:     Estimated blood loss:  none. Impression:               - One 15 mm polyp in the distal ascending colon s/p                            piecemeal polypectomy.                           - One 10 mm polyp in the hepatic flexure, removed                            with a hot snare. Resected and retrieved.                           - Diverticulosis in the sigmoid colon and in the                            descending colon.                           -  Non-bleeding internal hemorrhoids.                           - The examination was otherwise normal on direct                            and retroflexion views. Recommendation:           - Patient has a contact number available for                            emergencies. The signs and symptoms of potential                            delayed complications were discussed with the                            patient. Return to normal activities tomorrow.                            Written discharge instructions were provided to the                            patient.                           - Resume previous diet.                           - Continue present medications.                           - No aspirin, ibuprofen, naproxen, or other                            non-steroidal anti-inflammatory drugs for 5 days                            after polyp removal.                           - Await pathology results.                           - Repeat colonoscopy in 6 months to ensure complete                            resection.                           - Return to GI clinic in 12 weeks.                           - D/W daughter. Jackquline Denmark, MD 04/05/2019 10:11:54 AM This report has been signed electronically.

## 2019-04-05 NOTE — Progress Notes (Signed)
Called to room to assist during endoscopic procedure.  Patient ID and intended procedure confirmed with present staff. Received instructions for my participation in the procedure from the performing physician.  

## 2019-04-05 NOTE — Progress Notes (Signed)
PT taken to PACU. Monitors in place. VSS. Report given to RN. 

## 2019-04-05 NOTE — Progress Notes (Signed)
Temp by LC, Vital by DT

## 2019-04-05 NOTE — Patient Instructions (Signed)
YOU HAD AN ENDOSCOPIC PROCEDURE TODAY AT [facility]:   Refer to the procedure report that was given to you for any specific questions about what was found during the examination.  If the procedure report does not answer your questions, please call your gastroenterologist to clarify.  If you requested that your care partner not be given the details of your procedure findings, then the procedure report has been included in a sealed envelope for you to review at your convenience later.  YOU SHOULD EXPECT: Some feelings of bloating in the abdomen. Passage of more gas than usual.  Walking can help get rid of the air that was put into your GI tract during the procedure and reduce the bloating. If you had a lower endoscopy (such as a colonoscopy or flexible sigmoidoscopy) you may notice spotting of blood in your stool or on the toilet paper. If you underwent a bowel prep for your procedure, you may not have a normal bowel movement for a few days.  Please Note:  You might notice some irritation and congestion in your nose or some drainage.  This is from the oxygen used during your procedure.  There is no need for concern and it should clear up in a day or so.  SYMPTOMS TO REPORT IMMEDIATELY:   Following lower endoscopy (colonoscopy or flexible sigmoidoscopy):  Excessive amounts of blood in the stool  Significant tenderness or worsening of abdominal pains  Swelling of the abdomen that is new, acute  Fever of 100F or higher   For urgent or emergent issues, a gastroenterologist can be reached at any hour by calling [phone].   DIET:  We do recommend a small meal at first, but then you may proceed to your regular diet.  Drink plenty of fluids but you should avoid alcoholic beverages for 24 hours.  MEDICATIONS: Continue present medications. No Aspirin, Ibuprofen, Naproxen, or other non-steroidal anti-inflammatory drugs for 5 days after polyp removal.  Please see handouts given to  you by your recovery nurse.  FOLLOW UP: Repeat colonoscopy in 6 months to ensure complete resection of polyps. Follow up with Dr. Lyndel Safe in his office in 12 weeks.  ACTIVITY:  You should plan to take it easy for the rest of today and you should NOT DRIVE or use heavy machinery until tomorrow (because of the sedation medicines used during the test).    FOLLOW UP: Our staff will call the number listed on your records 48-72 hours following your procedure to check on you and address any questions or concerns that you may have regarding the information given to you following your procedure. If we do not reach you, we will leave a message.  We will attempt to reach you two times.  During this call, we will ask if you have developed any symptoms of COVID 19. If you develop any symptoms (ie: fever, flu-like symptoms, shortness of breath, cough etc.) before then, please call [phone].  If you test positive for Covid 19 in the 2 weeks post procedure, please call and report this information to [place].    If any biopsies were taken you will be contacted by phone or by letter within the next 1-3 weeks.  Please call us at [phone] if you have not heard about the biopsies in 3 weeks.   Thank you for allowing [place] to provide for your healthcare needs today.   SIGNATURES/CONFIDENTIALITY: You and/or your care partner have signed paperwork which will be entered into your  electronic medical record.  These signatures attest to the fact that that the information above on your After Visit Summary has been reviewed and is understood.  Full responsibility of the confidentiality of this discharge information lies with you and/or your care-partner.

## 2019-04-07 ENCOUNTER — Telehealth: Payer: Self-pay | Admitting: *Deleted

## 2019-04-07 ENCOUNTER — Telehealth: Payer: Self-pay

## 2019-04-07 NOTE — Telephone Encounter (Signed)
No answer for post procedure call back. Left message for patient to call with questions or concerns. 

## 2019-04-07 NOTE — Telephone Encounter (Signed)
First post procedure follow up call, no answer 

## 2019-04-10 ENCOUNTER — Encounter: Payer: Self-pay | Admitting: Gastroenterology

## 2019-04-13 ENCOUNTER — Other Ambulatory Visit (HOSPITAL_BASED_OUTPATIENT_CLINIC_OR_DEPARTMENT_OTHER): Payer: Medicare PPO

## 2019-04-14 ENCOUNTER — Ambulatory Visit (HOSPITAL_BASED_OUTPATIENT_CLINIC_OR_DEPARTMENT_OTHER)
Admission: RE | Admit: 2019-04-14 | Discharge: 2019-04-14 | Disposition: A | Discharge status: Home / Self Care | Payer: Medicare PPO | Source: Ambulatory Visit | Attending: Gastroenterology | Admitting: Gastroenterology

## 2019-04-14 ENCOUNTER — Other Ambulatory Visit: Payer: Self-pay

## 2019-04-14 DIAGNOSIS — Z86010 Personal history of colonic polyps: Secondary | ICD-10-CM | POA: Diagnosis not present

## 2019-04-14 DIAGNOSIS — R195 Other fecal abnormalities: Secondary | ICD-10-CM

## 2019-04-14 DIAGNOSIS — R160 Hepatomegaly, not elsewhere classified: Secondary | ICD-10-CM | POA: Insufficient documentation

## 2019-04-14 DIAGNOSIS — N281 Cyst of kidney, acquired: Secondary | ICD-10-CM | POA: Diagnosis not present

## 2019-04-14 DIAGNOSIS — K760 Fatty (change of) liver, not elsewhere classified: Secondary | ICD-10-CM | POA: Diagnosis not present

## 2019-05-06 ENCOUNTER — Other Ambulatory Visit: Payer: Self-pay | Admitting: Family Medicine

## 2019-05-15 ENCOUNTER — Other Ambulatory Visit: Payer: Self-pay | Admitting: Family Medicine

## 2019-05-20 ENCOUNTER — Ambulatory Visit: Payer: Medicare PPO | Admitting: Legal Medicine

## 2019-05-20 ENCOUNTER — Encounter: Payer: Self-pay | Admitting: Legal Medicine

## 2019-05-20 ENCOUNTER — Other Ambulatory Visit: Payer: Self-pay

## 2019-05-20 VITALS — BP 130/70 | HR 74 | Temp 97.7°F | Resp 17 | Ht 71.0 in | Wt 203.0 lb

## 2019-05-20 DIAGNOSIS — Z8546 Personal history of malignant neoplasm of prostate: Secondary | ICD-10-CM

## 2019-05-20 DIAGNOSIS — M47816 Spondylosis without myelopathy or radiculopathy, lumbar region: Secondary | ICD-10-CM

## 2019-05-20 DIAGNOSIS — R7301 Impaired fasting glucose: Secondary | ICD-10-CM | POA: Insufficient documentation

## 2019-05-20 DIAGNOSIS — C61 Malignant neoplasm of prostate: Secondary | ICD-10-CM | POA: Insufficient documentation

## 2019-05-20 DIAGNOSIS — M545 Low back pain: Secondary | ICD-10-CM | POA: Diagnosis not present

## 2019-05-20 DIAGNOSIS — E538 Deficiency of other specified B group vitamins: Secondary | ICD-10-CM

## 2019-05-20 HISTORY — DX: Personal history of malignant neoplasm of prostate: Z85.46

## 2019-05-20 HISTORY — DX: Deficiency of other specified B group vitamins: E53.8

## 2019-05-20 HISTORY — DX: Impaired fasting glucose: R73.01

## 2019-05-20 HISTORY — DX: Malignant neoplasm of prostate: C61

## 2019-05-20 MED ORDER — TRAMADOL HCL 50 MG PO TABS: 50.0000 mg | ORAL_TABLET | Freq: Three times a day (TID) | ORAL | 0 refills | Status: AC | PRN

## 2019-05-20 MED ORDER — CYCLOBENZAPRINE HCL 5 MG PO TABS: 5.0000 mg | ORAL_TABLET | Freq: Three times a day (TID) | ORAL | 1 refills | Status: DC | PRN

## 2019-05-20 NOTE — Patient Instructions (Signed)
Lumbar Strain A lumbar strain, which is sometimes called a low-back strain, is a stretch or tear in a muscle or the strong cords of tissue that attach muscle to bone (tendons) in the lower back (lumbar spine). This type of injury occurs when muscles or tendons are torn or are stretched beyond their limits. Lumbar strains can range from mild to severe. Mild strains may involve stretching a muscle or tendon without tearing it. These may heal in 1-2 weeks. More severe strains involve tearing of muscle fibers or tendons. These will cause more pain and may take 6-8 weeks to heal. What are the causes? This condition may be caused by:  Trauma, such as a fall or a hit to the body.  Twisting or overstretching the back. This may result from doing activities that need a lot of energy, such as lifting heavy objects. What increases the risk? This injury is more common in:  Athletes.  People with obesity.  People who do repeated lifting, bending, or other movements that involve their back. What are the signs or symptoms? Symptoms of this condition may include:  Sharp or dull pain in the lower back that does not go away. The pain may extend to the buttocks.  Stiffness or limited range of motion.  Sudden muscle tightening (spasms). How is this diagnosed? This condition may be diagnosed based on:  Your symptoms.  Your medical history.  A physical exam.  Imaging tests, such as: ? X-rays. ? MRI. How is this treated? Treatment for this condition may include:  Rest.  Applying heat and cold to the affected area.  Over-the-counter medicines to help relieve pain and inflammation, such as NSAIDs.  Prescription pain medicine and muscle relaxants may be needed for a short time.  Physical therapy. Follow these instructions at home: Managing pain, stiffness, and swelling      If directed, put ice on the injured area during the first 24 hours after your injury. ? Put ice in a plastic  bag. ? Place a towel between your skin and the bag. ? Leave the ice on for 20 minutes, 2-3 times a day.  If directed, apply heat to the affected area as often as told by your health care provider. Use the heat source that your health care provider recommends, such as a moist heat pack or a heating pad. ? Place a towel between your skin and the heat source. ? Leave the heat on for 20-30 minutes. ? Remove the heat if your skin turns bright red. This is especially important if you are unable to feel pain, heat, or cold. You may have a greater risk of getting burned. Activity  Rest and return to your normal activities as told by your health care provider. Ask your health care provider what activities are safe for you.  Do exercises as told by your health care provider. Medicines  Take over-the-counter and prescription medicines only as told by your health care provider.  Ask your health care provider if the medicine prescribed to you: ? Requires you to avoid driving or using heavy machinery. ? Can cause constipation. You may need to take these actions to prevent or treat constipation:  Drink enough fluid to keep your urine pale yellow.  Take over-the-counter or prescription medicines.  Eat foods that are high in fiber, such as beans, whole grains, and fresh fruits and vegetables.  Limit foods that are high in fat and processed sugars, such as fried or sweet foods. Injury prevention To prevent   a future low-back injury:  Always warm up properly before physical activity or sports.  Cool down and stretch after being active.  Use correct form when playing sports and lifting heavy objects. Bend your knees before you lift heavy objects.  Use good posture when sitting and standing.  Stay physically fit and keep a healthy weight. ? Do at least 150 minutes of moderate-intensity exercise each week, such as brisk walking or water aerobics. ? Do strength exercises at least 2 times each  week.  General instructions  Do not use any products that contain nicotine or tobacco, such as cigarettes, e-cigarettes, and chewing tobacco. If you need help quitting, ask your health care provider.  Keep all follow-up visits as told by your health care provider. This is important. Contact a health care provider if:  Your back pain does not improve after 6 weeks of treatment.  Your symptoms get worse. Get help right away if:  Your back pain is severe.  You are unable to stand or walk.  You develop pain in your legs.  You develop weakness in your buttocks or legs.  You have difficulty controlling when you urinate or when you have a bowel movement. ? You have frequent, painful, or bloody urination. ? You have a temperature over 101.0F (38.3C) Summary  A lumbar strain, which is sometimes called a low-back strain, is a stretch or tear in a muscle or the strong cords of tissue that attach muscle to bone (tendons) in the lower back (lumbar spine).  This type of injury occurs when muscles or tendons are torn or are stretched beyond their limits.  Rest and return to your normal activities as told by your health care provider. If directed, apply heat and ice to the affected area as often as told by your health care provider.  Take over-the-counter and prescription medicines only as told by your health care provider.  Contact a health care provider if you have new or worsening symptoms. This information is not intended to replace advice given to you by your health care provider. Make sure you discuss any questions you have with your health care provider. Document Revised: 11/26/2017 Document Reviewed: 11/26/2017 Elsevier Patient Education  2020 [employer]  

## 2019-05-20 NOTE — Progress Notes (Signed)
Established Patient Office Visit  Subjective:  Patient ID: [name], male    DOB: 10-12-1940  Age: 79 y.o. MRN: [id number]  CC:  Chief Complaint  Patient presents with  . Back Pain    Right side of the back pain since 3 weeks ago, but it was worse yesterday    HPI [name] presents for for back pain.  Pain for 2 months.  Leaned over to pet dog and now pain worse on right side.  Radiates to knee.  He has past back surgery with laminectomy.  Past Medical History:  Diagnosis Date  . Arthritis   . Atrial fibrillation ([place])   . Cancer ([place]) 10/2015   prostate cancer reated with radiation  . Cataract   . 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 Diarrhea 11/21/2018  . Dysrhythmia   . GERD (gastroesophageal reflux disease)   . Headache   . History of hiatal hernia   . History of kidney stones   . History of prostate cancer 05/20/2019  . Hyperlipemia   . Hypertension   . Malignant neoplasm prostate ([place]) 05/20/2019  . PONV (postoperative nausea and vomiting)     Past Surgical History:  Procedure Laterality Date  . anal fissures    . ANTERIOR CERVICAL DECOMP/DISCECTOMY FUSION N/A 03/04/2018   Procedure: Anterior Cervical Decompression Fusion - Cervical three-Cervical four - Cervical four-Cervical five;  Surgeon: Eustace Moore, MD;  Location: [facility];  Service: Neurosurgery;  Laterality: N/A;  . BACK SURGERY    . CARDIAC CATHETERIZATION     no PCI  . CARPAL TUNNEL RELEASE Left 03/04/2018   Procedure: Carpal Tunnel Release - left;  Surgeon: Eustace Moore, MD;  Location: [facility];  Service: Neurosurgery;  Laterality: Left;  . CHOLECYSTECTOMY    . COLONOSCOPY W/ POLYPECTOMY  08/30/2012   Colonic polyp status post polypectomy.  Pancolonic diverticulosis predominantly in the sigmoid colon. Small internal hemorrhoids. Moderate internal hemorhroids.   . ESOPHAGOGASTRODUODENOSCOPY  08/04/2016   Schatzkis ring status post esophageal dilatation. Small hiatal  hernia. Mild gastritis.   Marland Kitchen EYE SURGERY     cataract removal bilateral eyes  . FOOT SURGERY     left heel  . HERNIA REPAIR    . SHOULDER SURGERY     bilateral    Family History  Problem Relation Age of Onset  . Colon cancer Neg Hx   . Esophageal cancer Neg Hx   . Rectal cancer Neg Hx   . Stomach cancer Neg Hx     Social History   Socioeconomic History  . Marital status: Widowed    Spouse name: Not on file  . Number of children: 2  . Years of education: Not on file  . Highest education level: Not on file  Occupational History  . Not on file  Tobacco Use  . Smoking status: Former Smoker    Packs/day: 0.50    Years: 35.00    Pack years: 17.50    Types: Cigarettes    Quit date: 02/10/1993    Years since quitting: 26.2  . Smokeless tobacco: Former Systems developer    Types: [place] date: 02/10/2006  Substance and Sexual Activity  . Alcohol use: Yes    Alcohol/week: 1.0 standard drinks    Types: 1 Cans of beer per week    Comment: monthly  . Drug use: No  . Sexual activity: Yes    Partners: Female  Other Topics Concern  .  Not on file  Social History Narrative  . Not on file   Social Determinants of Health   Financial Resource Strain:   . Difficulty of Paying Living Expenses:   Food Insecurity:   . Worried About Charity fundraiser in the Last Year:   . Arboriculturist in the Last Year:   Transportation Needs:   . Film/video editor (Medical):   Marland Kitchen Lack of Transportation (Non-Medical):   Physical Activity:   . Days of Exercise per Week:   . Minutes of Exercise per Session:   Stress:   . Feeling of Stress :   Social Connections:   . Frequency of Communication with Friends and Family:   . Frequency of Social Gatherings with Friends and Family:   . Attends Religious Services:   . Active Member of Clubs or Organizations:   . Attends Archivist Meetings:   Marland Kitchen Marital Status:   Intimate Partner Violence:   . Fear of Current or Ex-Partner:   .  Emotionally Abused:   Marland Kitchen Physically Abused:   . Sexually Abused:     Outpatient Medications Prior to Visit  Medication Sig Dispense Refill  . ASPIRIN LOW DOSE 81 MG EC tablet TAKE 1 TABLET BY MOUTH EVERY DAY 90 tablet 1  . atorvastatin (LIPITOR) 40 MG tablet TAKE 1 TABLET (40 MG TOTAL) BY MOUTH DAILY AT 6 PM. 90 tablet 2  . cholecalciferol (VITAMIN D) 1000 UNITS tablet Take 1,000 Units by mouth daily.    . Glycerin-Polysorbate 80 (REFRESH DRY EYE THERAPY) 1-1 % SOLN Place 1 drop into both eyes 2 (two) times daily as needed (for dry eyes).     Marland Kitchen lisinopril (PRINIVIL,ZESTRIL) 10 MG tablet Take 10 mg by mouth daily.    . nitroGLYCERIN (NITROSTAT) 0.4 MG SL tablet Place 1 tablet (0.4 mg total) under the tongue every 5 (five) minutes x 3 doses as needed for chest pain. 25 tablet 12  . Omega-3 Fatty Acids (FISH OIL PO) Take 1 capsule by mouth daily.     . pantoprazole (PROTONIX) 40 MG tablet TAKE 1 TABLET BY MOUTH TWICE DAILY 180 tablet 1  . tamsulosin (FLOMAX) 0.4 MG CAPS capsule Take 0.4 mg by mouth daily after supper.   1  . vitamin B-12 (CYANOCOBALAMIN) 500 MCG tablet Take 1 tablet by mouth daily.    Marland Kitchen 0.9 %  sodium chloride infusion      No facility-administered medications prior to visit.    Allergies  Allergen Reactions  . Norco [Hydrocodone-Acetaminophen] Other (See Comments)    Confusion    ROS Review of Systems  Constitutional: Negative.   HENT: Negative.   Eyes: Negative.   Respiratory: Negative.   Cardiovascular: Negative.   Gastrointestinal: Negative.   Endocrine: Negative.   Genitourinary: Negative.   Musculoskeletal: Negative.   Skin: Negative.   Neurological: Negative.   Psychiatric/Behavioral: Negative.       Objective:    Physical Exam  Constitutional: He appears well-nourished.  Eyes: Pupils are equal, round, and reactive to light. Conjunctivae are normal.  Cardiovascular: Normal heart sounds.  Pulmonary/Chest: Effort normal and breath sounds normal.   Musculoskeletal:     Cervical back: Neck supple.     Lumbar back: Pain, spasms and tenderness present.       Back:  Vitals reviewed.   BP 130/70 (BP Location: Right Arm, Patient Position: Sitting)   Pulse 74   Temp 97.7 F (36.5 C) (Temporal)   Resp 17  Ht 5\' 11"  (1.803 m)   Wt 203 lb (92.1 kg)   BMI 28.31 kg/m  Wt Readings from Last 3 Encounters:  05/20/19 203 lb (92.1 kg)  04/05/19 207 lb 6.4 oz (94.1 kg)  03/29/19 207 lb 4 oz (94 kg)     Health Maintenance Due  Topic Date Due  . TETANUS/TDAP  Never done  . PNA vac Low Risk Adult (1 of 2 - PCV13) Never done    There are no preventive care reminders to display for this patient.  No results found for: TSH Lab Results  Component Value Date   WBC 8.5 02/25/2018   HGB 15.4 02/25/2018   HCT 46.9 02/25/2018   MCV 88.0 02/25/2018   PLT 244 02/25/2018   Lab Results  Component Value Date   NA 137 02/25/2018   K 4.1 02/25/2018   CO2 22 02/25/2018   GLUCOSE 101 (H) 02/25/2018   BUN 19 02/25/2018   CREATININE 0.82 02/25/2018   BILITOT 1.3 (H) 08/16/2017   ALKPHOS 85 08/16/2017   AST 37 08/16/2017   ALT 46 (H) 08/16/2017   PROT 7.7 08/16/2017   ALBUMIN 4.3 08/16/2017   CALCIUM 9.9 02/25/2018   ANIONGAP 10 02/25/2018   Lab Results  Component Value Date   CHOL 102 01/24/2017   Lab Results  Component Value Date   HDL 39 (L) 01/24/2017   Lab Results  Component Value Date   LDLCALC 48 01/24/2017   Lab Results  Component Value Date   TRIG 76 01/24/2017   Lab Results  Component Value Date   CHOLHDL 2.6 01/24/2017   No results found for: HGBA1C    Assessment & Plan:   Problem List Items Addressed This Visit    None      No orders of the defined types were placed in this encounter.   Follow-up: No follow-ups on file.    Reinaldo Meeker, MD

## 2019-05-24 DIAGNOSIS — M5416 Radiculopathy, lumbar region: Secondary | ICD-10-CM | POA: Diagnosis not present

## 2019-05-24 HISTORY — DX: Radiculopathy, lumbar region: M54.16

## 2019-05-24 NOTE — Progress Notes (Signed)
Established Patient Office Visit  Subjective:  Patient ID: [name], male    DOB: 10/01/1940  Age: 79 y.o. MRN: [id number]  CC:  Chief Complaint  Patient presents with  . Gastroesophageal Reflux  . Hypertension  . Hyperlipidemia    HPI Hypertension was first diagnosed several years ago.  He is not using any nonpharmacologic treatment modalities.  His current cardiac medication regimen includes an ACE inhibitor ( lisinopril ), aspirin, and nitroglycerin on hand.  He is tolerating the medication well without side effects.  Compliance with treatment has been good; he takes his medication as directed. Tries to maintain his diet. Not exercising.    Pt presents with hyperlipidemia.  Current treatment includes Lipitor and fish oil.  Compliance with treatment has been good; he takes his medication as directed, follows up as directed, and maintains his exercise regimen.  He denies experiencing any hypercholesterolemia related symptoms.      [name] presents with a diagnosis of impaired fasting glucose.  The course has been stable and nonprogressive.  eats healthy.     [name] presents with a diagnosis of atrial fibrillation.  Not on any medicines. Not on a blood thinner. Sees cardiology later this month.   [name] is complaining of lumbar back pain and saw Dr. Adah Salvage NP, Susann Givens. On cyclobenzaprine.  Past Medical History:  Diagnosis Date  . Arthritis   . Atrial fibrillation ([place])   . Cancer ([place]) 10/2015   prostate cancer reated with radiation  . Cataract   . 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 Diarrhea 11/21/2018  . Dysrhythmia   . GERD (gastroesophageal reflux disease)   . Headache   . History of hiatal hernia   . History of kidney stones   . History of prostate cancer 05/20/2019  . Hyperlipemia   . Hypertension   . Malignant neoplasm prostate ([place]) 05/20/2019  . PONV (postoperative nausea and vomiting)     Past Surgical History:  Procedure Laterality  Date  . anal fissures    . ANTERIOR CERVICAL DECOMP/DISCECTOMY FUSION N/A 03/04/2018   Procedure: Anterior Cervical Decompression Fusion - Cervical three-Cervical four - Cervical four-Cervical five;  Surgeon: Eustace Moore, MD;  Location: [facility];  Service: Neurosurgery;  Laterality: N/A;  . BACK SURGERY    . CARDIAC CATHETERIZATION     no PCI  . CARPAL TUNNEL RELEASE Left 03/04/2018   Procedure: Carpal Tunnel Release - left;  Surgeon: Eustace Moore, MD;  Location: [facility];  Service: Neurosurgery;  Laterality: Left;  . CHOLECYSTECTOMY    . COLONOSCOPY W/ POLYPECTOMY  08/30/2012   Colonic polyp status post polypectomy.  Pancolonic diverticulosis predominantly in the sigmoid colon. Small internal hemorrhoids. Moderate internal hemorhroids.   . ESOPHAGOGASTRODUODENOSCOPY  08/04/2016   Schatzkis ring status post esophageal dilatation. Small hiatal hernia. Mild gastritis.   Marland Kitchen EYE SURGERY     cataract removal bilateral eyes  . FOOT SURGERY     left heel  . HERNIA REPAIR    . SHOULDER SURGERY     bilateral    Family History  Problem Relation Age of Onset  . Colon cancer Neg Hx   . Esophageal cancer Neg Hx   . Rectal cancer Neg Hx   . Stomach cancer Neg Hx     Social History   Socioeconomic History  . Marital status: Widowed    Spouse name: Not on file  . Number of children: 2  . Years of education: Not on file  .  Highest education level: Not on file  Occupational History  . Not on file  Tobacco Use  . Smoking status: Former Smoker    Packs/day: 0.50    Years: 35.00    Pack years: 17.50    Types: Cigarettes    Quit date: 02/10/1993    Years since quitting: 26.3  . Smokeless tobacco: Former Systems developer    Types: [place] date: 02/10/2006  Substance and Sexual Activity  . Alcohol use: Yes    Alcohol/week: 1.0 standard drinks    Types: 1 Cans of beer per week    Comment: monthly  . Drug use: No  . Sexual activity: Yes    Partners: Female  Other Topics Concern  . Not on file   Social History Narrative  . Not on file   Social Determinants of Health   Financial Resource Strain:   . Difficulty of Paying Living Expenses:   Food Insecurity:   . Worried About Charity fundraiser in the Last Year:   . Arboriculturist in the Last Year:   Transportation Needs:   . Film/video editor (Medical):   Marland Kitchen Lack of Transportation (Non-Medical):   Physical Activity:   . Days of Exercise per Week:   . Minutes of Exercise per Session:   Stress:   . Feeling of Stress :   Social Connections:   . Frequency of Communication with Friends and Family:   . Frequency of Social Gatherings with Friends and Family:   . Attends Religious Services:   . Active Member of Clubs or Organizations:   . Attends Archivist Meetings:   Marland Kitchen Marital Status:   Intimate Partner Violence:   . Fear of Current or Ex-Partner:   . Emotionally Abused:   Marland Kitchen Physically Abused:   . Sexually Abused:     Outpatient Medications Prior to Visit  Medication Sig Dispense Refill  . ASPIRIN LOW DOSE 81 MG EC tablet TAKE 1 TABLET BY MOUTH EVERY DAY 90 tablet 1  . atorvastatin (LIPITOR) 40 MG tablet TAKE 1 TABLET (40 MG TOTAL) BY MOUTH DAILY AT 6 PM. 90 tablet 2  . cholecalciferol (VITAMIN D) 1000 UNITS tablet Take 1,000 Units by mouth daily.    . cyclobenzaprine (FLEXERIL) 5 MG tablet Take 1 tablet (5 mg total) by mouth 3 (three) times daily as needed for muscle spasms. 30 tablet 1  . Glycerin-Polysorbate 80 (REFRESH DRY EYE THERAPY) 1-1 % SOLN Place 1 drop into both eyes 2 (two) times daily as needed (for dry eyes).     Marland Kitchen lisinopril (PRINIVIL,ZESTRIL) 10 MG tablet Take 10 mg by mouth daily.    . nitroGLYCERIN (NITROSTAT) 0.4 MG SL tablet Place 1 tablet (0.4 mg total) under the tongue every 5 (five) minutes x 3 doses as needed for chest pain. 25 tablet 12  . Omega-3 Fatty Acids (FISH OIL PO) Take 1 capsule by mouth daily.     . pantoprazole (PROTONIX) 40 MG tablet TAKE 1 TABLET BY MOUTH TWICE DAILY 180  tablet 1  . tamsulosin (FLOMAX) 0.4 MG CAPS capsule Take 0.4 mg by mouth daily after supper.   1  . traMADol (ULTRAM) 50 MG tablet Take 1 tablet (50 mg total) by mouth every 8 (eight) hours as needed for up to 5 days. 15 tablet 0  . vitamin B-12 (CYANOCOBALAMIN) 500 MCG tablet Take 1 tablet by mouth daily.     No facility-administered medications prior to visit.  Allergies  Allergen Reactions  . Norco [Hydrocodone-Acetaminophen] Other (See Comments)    Confusion    ROS Review of Systems  Constitutional: Negative for chills, diaphoresis, fatigue and fever.  HENT: Negative for congestion, ear pain and sore throat.   Respiratory: Positive for shortness of breath. Negative for cough.        With mild exertion. No orthopnea/PND.  Cardiovascular: Negative for chest pain and leg swelling.  Gastrointestinal: Negative for abdominal pain, constipation, diarrhea, nausea and vomiting.  Genitourinary: Negative for dysuria and urgency.  Musculoskeletal: Positive for back pain. Negative for arthralgias and myalgias.  Neurological: Negative for dizziness and headaches.  Psychiatric/Behavioral: Negative for dysphoric mood.      Objective:    Physical Exam  Constitutional: He appears well-developed and well-nourished.  Cardiovascular: Normal rate, regular rhythm and normal heart sounds.  Pulmonary/Chest: Effort normal and breath sounds normal.  Abdominal: Soft. There is no abdominal tenderness.  Neurological: He is alert.  Psychiatric: He has a normal mood and affect. His behavior is normal.    BP 116/76 (BP Location: Right Arm, Patient Position: Sitting)   Pulse 84   Temp (!) 97.3 F (36.3 C) (Temporal)   Ht 5\' 11"  (1.803 m)   Wt 202 lb (91.6 kg)   SpO2 98%   BMI 28.17 kg/m  Wt Readings from Last 3 Encounters:  05/25/19 202 lb (91.6 kg)  05/20/19 203 lb (92.1 kg)  04/05/19 207 lb 6.4 oz (94.1 kg)     Health Maintenance Due  Topic Date Due  . TETANUS/TDAP  Never done  . PNA  vac Low Risk Adult (1 of 2 - PCV13) Never done    There are no preventive care reminders to display for this patient.  No results found for: TSH Lab Results  Component Value Date   WBC 6.2 05/25/2019   HGB 16.4 05/25/2019   HCT 47.3 05/25/2019   MCV 86 05/25/2019   PLT 190 05/25/2019   Lab Results  Component Value Date   NA 141 05/25/2019   K 4.6 05/25/2019   CO2 21 05/25/2019   GLUCOSE 102 (H) 05/25/2019   BUN 13 05/25/2019   CREATININE 0.89 05/25/2019   BILITOT 1.2 05/25/2019   ALKPHOS 93 05/25/2019   AST 35 05/25/2019   ALT 46 (H) 05/25/2019   PROT 7.5 05/25/2019   ALBUMIN 5.0 (H) 05/25/2019   CALCIUM 10.2 05/25/2019   ANIONGAP 10 02/25/2018   Lab Results  Component Value Date   CHOL 116 05/25/2019   Lab Results  Component Value Date   HDL 44 05/25/2019   Lab Results  Component Value Date   LDLCALC 55 05/25/2019   Lab Results  Component Value Date   TRIG 90 05/25/2019   Lab Results  Component Value Date   CHOLHDL 2.6 05/25/2019   Lab Results  Component Value Date   HGBA1C 6.4 (H) 05/25/2019      Assessment & Plan:  1. Essential hypertension Well controlled.  No changes to medicines.  Continue to work on eating a healthy diet and exercise.  Labs drawn today.  - CBC with Differential/Platelet - Comprehensive metabolic panel  2. Mixed hyperlipidemia Well controlled.  No changes to medicines.  Continue to work on eating a healthy diet and exercise.  Labs drawn today.  - Lipid panel  3. Dyspnea on exertion - DG Chest 2 View; Future  4. Impaired fasting glucose Recommend low sugar diet. Start metformin 500 mg once daily. - Hemoglobin A1c (results 6.4.)  Follow-up: Return in about 3 months (around 08/24/2019).    Rochel Brome, MD

## 2019-05-25 ENCOUNTER — Other Ambulatory Visit: Payer: Self-pay

## 2019-05-25 ENCOUNTER — Ambulatory Visit (INDEPENDENT_AMBULATORY_CARE_PROVIDER_SITE_OTHER): Payer: Medicare PPO | Admitting: Family Medicine

## 2019-05-25 ENCOUNTER — Encounter: Payer: Self-pay | Admitting: Family Medicine

## 2019-05-25 VITALS — BP 116/76 | HR 84 | Temp 97.3°F | Ht 71.0 in | Wt 202.0 lb

## 2019-05-25 DIAGNOSIS — I10 Essential (primary) hypertension: Secondary | ICD-10-CM | POA: Diagnosis not present

## 2019-05-25 DIAGNOSIS — R7301 Impaired fasting glucose: Secondary | ICD-10-CM | POA: Diagnosis not present

## 2019-05-25 DIAGNOSIS — E782 Mixed hyperlipidemia: Secondary | ICD-10-CM | POA: Diagnosis not present

## 2019-05-25 DIAGNOSIS — R0600 Dyspnea, unspecified: Secondary | ICD-10-CM

## 2019-05-25 DIAGNOSIS — R0602 Shortness of breath: Secondary | ICD-10-CM | POA: Diagnosis not present

## 2019-05-25 DIAGNOSIS — R0609 Other forms of dyspnea: Secondary | ICD-10-CM

## 2019-05-26 LAB — CBC WITH DIFFERENTIAL/PLATELET
Basophils Absolute: 0 10*3/uL (ref 0.0–0.2)
Basos: 0 %
EOS (ABSOLUTE): 0.1 10*3/uL (ref 0.0–0.4)
Eos: 2 %
Hematocrit: 47.3 % (ref 37.5–51.0)
Hemoglobin: 16.4 g/dL (ref 13.0–17.7)
Immature Grans (Abs): 0 10*3/uL (ref 0.0–0.1)
Immature Granulocytes: 0 %
Lymphocytes Absolute: 1.6 10*3/uL (ref 0.7–3.1)
Lymphs: 26 %
MCH: 29.9 pg (ref 26.6–33.0)
MCHC: 34.7 g/dL (ref 31.5–35.7)
MCV: 86 fL (ref 79–97)
Monocytes Absolute: 0.6 10*3/uL (ref 0.1–0.9)
Monocytes: 10 %
Neutrophils Absolute: 3.9 10*3/uL (ref 1.4–7.0)
Neutrophils: 62 %
Platelets: 190 10*3/uL (ref 150–450)
RBC: 5.48 x10E6/uL (ref 4.14–5.80)
RDW: 12.8 % (ref 11.6–15.4)
WBC: 6.2 10*3/uL (ref 3.4–10.8)

## 2019-05-26 LAB — COMPREHENSIVE METABOLIC PANEL
ALT: 46 IU/L — ABNORMAL HIGH (ref 0–44)
AST: 35 IU/L (ref 0–40)
Albumin/Globulin Ratio: 2 (ref 1.2–2.2)
Albumin: 5 g/dL — ABNORMAL HIGH (ref 3.6–4.6)
Alkaline Phosphatase: 93 IU/L (ref 39–117)
BUN/Creatinine Ratio: 15 (ref 10–24)
BUN: 13 mg/dL (ref 8–27)
Bilirubin Total: 1.2 mg/dL (ref 0.0–1.2)
CO2: 21 mmol/L (ref 20–29)
Calcium: 10.2 mg/dL (ref 8.6–10.2)
Chloride: 102 mmol/L (ref 96–106)
Creatinine, Ser: 0.89 mg/dL (ref 0.76–1.27)
GFR calc Af Amer: 93 mL/min/{1.73_m2} (ref >59)
GFR calc non Af Amer: 80 mL/min/{1.73_m2} (ref >59)
Globulin, Total: 2.5 g/dL (ref 1.5–4.5)
Glucose: 102 mg/dL — ABNORMAL HIGH (ref 65–99)
Potassium: 4.6 mmol/L (ref 3.5–5.2)
Sodium: 141 mmol/L (ref 134–144)
Total Protein: 7.5 g/dL (ref 6.0–8.5)

## 2019-05-26 LAB — LIPID PANEL
Chol/HDL Ratio: 2.6 ratio (ref 0.0–5.0)
Cholesterol, Total: 116 mg/dL (ref 100–199)
HDL: 44 mg/dL (ref >39)
LDL Chol Calc (NIH): 55 mg/dL (ref 0–99)
Triglycerides: 90 mg/dL (ref 0–149)
VLDL Cholesterol Cal: 17 mg/dL (ref 5–40)

## 2019-05-26 LAB — HEMOGLOBIN A1C
Est. average glucose Bld gHb Est-mCnc: 137 mg/dL
Hgb A1c MFr Bld: 6.4 % — ABNORMAL HIGH (ref 4.8–5.6)

## 2019-05-26 LAB — CARDIOVASCULAR RISK ASSESSMENT

## 2019-05-27 ENCOUNTER — Other Ambulatory Visit: Payer: Self-pay

## 2019-05-27 MED ORDER — METFORMIN HCL 500 MG PO TABS: 500.0000 mg | ORAL_TABLET | Freq: Every day | ORAL | 0 refills | Status: DC

## 2019-05-29 ENCOUNTER — Encounter: Payer: Self-pay | Admitting: Family Medicine

## 2019-06-01 ENCOUNTER — Ambulatory Visit: Payer: Medicare PPO | Admitting: Cardiology

## 2019-06-08 DIAGNOSIS — M545 Low back pain: Secondary | ICD-10-CM | POA: Diagnosis not present

## 2019-06-08 DIAGNOSIS — M5416 Radiculopathy, lumbar region: Secondary | ICD-10-CM | POA: Diagnosis not present

## 2019-06-08 DIAGNOSIS — R351 Nocturia: Secondary | ICD-10-CM | POA: Diagnosis not present

## 2019-06-08 DIAGNOSIS — C61 Malignant neoplasm of prostate: Secondary | ICD-10-CM | POA: Diagnosis not present

## 2019-06-09 DIAGNOSIS — M5416 Radiculopathy, lumbar region: Secondary | ICD-10-CM | POA: Diagnosis not present

## 2019-06-28 ENCOUNTER — Encounter: Payer: Self-pay | Admitting: Cardiology

## 2019-06-28 ENCOUNTER — Other Ambulatory Visit: Payer: Self-pay

## 2019-06-28 ENCOUNTER — Ambulatory Visit: Payer: Medicare PPO | Admitting: Cardiology

## 2019-06-28 VITALS — BP 160/96 | HR 79 | Ht 71.0 in | Wt 202.0 lb

## 2019-06-28 DIAGNOSIS — E088 Diabetes mellitus due to underlying condition with unspecified complications: Secondary | ICD-10-CM | POA: Diagnosis not present

## 2019-06-28 DIAGNOSIS — R0609 Other forms of dyspnea: Secondary | ICD-10-CM | POA: Insufficient documentation

## 2019-06-28 DIAGNOSIS — I2510 Atherosclerotic heart disease of native coronary artery without angina pectoris: Secondary | ICD-10-CM | POA: Diagnosis not present

## 2019-06-28 DIAGNOSIS — R0789 Other chest pain: Secondary | ICD-10-CM | POA: Insufficient documentation

## 2019-06-28 DIAGNOSIS — R0600 Dyspnea, unspecified: Secondary | ICD-10-CM | POA: Diagnosis not present

## 2019-06-28 DIAGNOSIS — E7800 Pure hypercholesterolemia, unspecified: Secondary | ICD-10-CM

## 2019-06-28 DIAGNOSIS — I10 Essential (primary) hypertension: Secondary | ICD-10-CM | POA: Diagnosis not present

## 2019-06-28 DIAGNOSIS — R079 Chest pain, unspecified: Secondary | ICD-10-CM

## 2019-06-28 DIAGNOSIS — I77810 Thoracic aortic ectasia: Secondary | ICD-10-CM

## 2019-06-28 HISTORY — DX: Dyspnea, unspecified: R06.00

## 2019-06-28 HISTORY — DX: Other chest pain: R07.89

## 2019-06-28 HISTORY — DX: Other forms of dyspnea: R06.09

## 2019-06-28 HISTORY — DX: Diabetes mellitus due to underlying condition with unspecified complications: E08.8

## 2019-06-28 MED ORDER — NITROGLYCERIN 0.4 MG SL SUBL: 0.4000 mg | SUBLINGUAL_TABLET | SUBLINGUAL | 12 refills | Status: AC | PRN

## 2019-06-28 NOTE — Progress Notes (Signed)
Cardiology Office Note:    Date:  06/28/2019   ID:  [name], DOB April 05, 1940, MRN [id number]  PCP:  Oscar Brome, MD  Cardiologist:  Oscar Lindau, MD   Referring MD: Oscar Brome, MD    ASSESSMENT:    1. Coronary artery disease involving native coronary artery of native heart without angina pectoris   2. Essential hypertension   3. Hypercholesteremia   4. Diabetes mellitus due to underlying condition with unspecified complications ([place])   5. Dyspnea on exertion   6. Chest tightness    PLAN:    In order of problems listed above:  1. Coronary artery disease: Secondary prevention stressed with the patient.  Importance of compliance with diet medication stressed and he vocalized understanding.  Because of his symptoms I will do a Lexiscan sestamibi to assess for any objective evidence of coronary artery disease 2. Essential hypertension: Blood pressure stable 3. Mixed dyslipidemia: Lipids were reviewed and discussed with him.  Diet was emphasized 4. Dyspnea on exertion: Because of this I will do a D-dimer today.  This is to rule out any possibility of pulmonary thromboembolism.  Echocardiogram will be done to assess murmur on auscultation. 5. Patient will be seen in follow-up appointment in 1 month or earlier if the patient has any concerns    Medication Adjustments/Labs and Tests Ordered: Current medicines are reviewed at length with the patient today.  Concerns regarding medicines are outlined above.  No orders of the defined types were placed in this encounter.  No orders of the defined types were placed in this encounter.    Chief Complaint  Patient presents with  . Follow-up     History of Present Illness:    [name] is a 79 y.o. male.  Patient has past medical history of coronary artery disease nonobstructive in nature of which the details are not available, essential hypertension dyslipidemia and diabetes mellitus.  He denies any problems at this  time his only issue is that in the past 6 months he has been noticing shortness of breath on exertion which has affected his quality of life.  He occasionally has chest tightness also.  At the time of my evaluation, the patient is alert awake oriented and in no distress.  His friend accompanies him for this visit today.  Past Medical History:  Diagnosis Date  . Arthritis   . Atrial fibrillation ([place])   . Cancer ([place]) 10/2015   prostate cancer reated with radiation  . Cataract   . Colitis 11/22/2018  . 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 Depression 04/04/2019  . Diarrhea 11/21/2018  . Dyslipidemia 06/17/2016  . Dysrhythmia   . Essential hypertension 06/17/2016  . GERD (gastroesophageal reflux disease)   . Headache   . History of hiatal hernia   . History of kidney stones   . History of prostate cancer 05/20/2019  . Hypercholesteremia 04/04/2019  . Hyperlipemia   . Hypertension   . Impaired fasting blood sugar 05/20/2019  . Malignant neoplasm prostate ([place]) 05/20/2019  . Paroxysmal atrial fibrillation ([place]) 04/04/2019  . PONV (postoperative nausea and vomiting)   . 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 S/P lumbar spinal fusion 09/29/2013  . Sepsis ([place]) 11/22/2018    Past Surgical History:  Procedure Laterality Date  . anal fissures    . ANTERIOR CERVICAL DECOMP/DISCECTOMY FUSION N/A 03/04/2018   Procedure: Anterior Cervical  Decompression Fusion - Cervical three-Cervical four - Cervical four-Cervical five;  Surgeon: Eustace Moore, MD;  Location: [facility];  Service: Neurosurgery;  Laterality: N/A;  . BACK SURGERY    . CARDIAC CATHETERIZATION     no PCI  . CARPAL TUNNEL RELEASE Left 03/04/2018   Procedure: Carpal Tunnel Release - left;  Surgeon: Eustace Moore, MD;  Location: [facility];  Service: Neurosurgery;  Laterality: Left;  . CHOLECYSTECTOMY    . COLONOSCOPY W/ POLYPECTOMY   08/30/2012   Colonic polyp status post polypectomy.  Pancolonic diverticulosis predominantly in the sigmoid colon. Small internal hemorrhoids. Moderate internal hemorhroids.   . ESOPHAGOGASTRODUODENOSCOPY  08/04/2016   Schatzkis ring status post esophageal dilatation. Small hiatal hernia. Mild gastritis.   Marland Kitchen EYE SURGERY     cataract removal bilateral eyes  . FOOT SURGERY     left heel  . HERNIA REPAIR    . SHOULDER SURGERY     bilateral    Current Medications: Current Meds  Medication Sig  . ASPIRIN LOW DOSE 81 MG EC tablet TAKE 1 TABLET BY MOUTH EVERY DAY  . atorvastatin (LIPITOR) 40 MG tablet TAKE 1 TABLET (40 MG TOTAL) BY MOUTH DAILY AT 6 PM.  . cholecalciferol (VITAMIN D) 1000 UNITS tablet Take 1,000 Units by mouth daily.  . diazepam (VALIUM) 5 MG tablet   . gabapentin (NEURONTIN) 300 MG capsule   . Glycerin-Polysorbate 80 (REFRESH DRY EYE THERAPY) 1-1 % SOLN Place 1 drop into both eyes 2 (two) times daily as needed (for dry eyes).   Marland Kitchen HYDROcodone-acetaminophen (NORCO/VICODIN) 5-325 MG tablet   . lisinopril (PRINIVIL,ZESTRIL) 10 MG tablet Take 10 mg by mouth daily.  . meloxicam (MOBIC) 15 MG tablet   . metFORMIN (GLUCOPHAGE) 500 MG tablet Take 1 tablet (500 mg total) by mouth daily with breakfast.  . nitroGLYCERIN (NITROSTAT) 0.4 MG SL tablet Place 1 tablet (0.4 mg total) under the tongue every 5 (five) minutes x 3 doses as needed for chest pain.  . Omega-3 Fatty Acids (FISH OIL PO) Take 1 capsule by mouth daily.   . pantoprazole (PROTONIX) 40 MG tablet TAKE 1 TABLET BY MOUTH TWICE DAILY  . tamsulosin (FLOMAX) 0.4 MG CAPS capsule Take 0.4 mg by mouth daily after supper.   . vitamin B-12 (CYANOCOBALAMIN) 500 MCG tablet Take 1 tablet by mouth daily.     Allergies:   Norco [hydrocodone-acetaminophen]   Social History   Socioeconomic History  . Marital status: Widowed    Spouse name: Not on file  . Number of children: 2  . Years of education: Not on file  . Highest education  level: Not on file  Occupational History  . Not on file  Tobacco Use  . Smoking status: Former Smoker    Packs/day: 0.50    Years: 35.00    Pack years: 17.50    Types: Cigarettes    Quit date: 02/10/1993    Years since quitting: 26.3  . Smokeless tobacco: Former Systems developer    Types: [place] date: 02/10/2006  Substance and Sexual Activity  . Alcohol use: Yes    Alcohol/week: 1.0 standard drinks    Types: 1 Cans of beer per week    Comment: monthly  . Drug use: No  . Sexual activity: Yes    Partners: Female  Other Topics Concern  . Not on file  Social History Narrative  . Not on file   Social Determinants of Health   Financial Resource Strain:   .  Difficulty of Paying Living Expenses:   Food Insecurity:   . Worried About Charity fundraiser in the Last Year:   . Arboriculturist in the Last Year:   Transportation Needs:   . Film/video editor (Medical):   Marland Kitchen Lack of Transportation (Non-Medical):   Physical Activity:   . Days of Exercise per Week:   . Minutes of Exercise per Session:   Stress:   . Feeling of Stress :   Social Connections:   . Frequency of Communication with Friends and Family:   . Frequency of Social Gatherings with Friends and Family:   . Attends Religious Services:   . Active Member of Clubs or Organizations:   . Attends Archivist Meetings:   Marland Kitchen Marital Status:      Family History: The patient's family history is negative for Colon cancer, Esophageal cancer, Rectal cancer, and Stomach cancer.  ROS:   Please see the history of present illness.    All other systems reviewed and are negative.  EKGs/Labs/Other Studies Reviewed:    The following studies were reviewed today: EKG reveals sinus rhythm poor anterior forces suggesting of possible anterior wall myocardial infarction.  Undetermined age.   Recent Labs: 05/25/2019: ALT 46; BUN 13; Creatinine, Ser 0.89; Hemoglobin 16.4; Platelets 190; Potassium 4.6; Sodium 141  Recent Lipid  Panel    Component Value Date/Time   CHOL 116 05/25/2019 1143   TRIG 90 05/25/2019 1143   HDL 44 05/25/2019 1143   CHOLHDL 2.6 05/25/2019 1143   CHOLHDL 2.6 01/24/2017 0521   VLDL 15 01/24/2017 0521   LDLCALC 55 05/25/2019 1143    Physical Exam:    VS:  BP (!) 160/96   Pulse 79   Ht 5\' 11"  (1.803 m)   Wt 202 lb (91.6 kg)   SpO2 96%   BMI 28.17 kg/m     Wt Readings from Last 3 Encounters:  06/28/19 202 lb (91.6 kg)  05/25/19 202 lb (91.6 kg)  05/20/19 203 lb (92.1 kg)     GEN: Patient is in no acute distress HEENT: Normal NECK: No JVD; No carotid bruits LYMPHATICS: No lymphadenopathy CARDIAC: Hear sounds regular, 2/6 systolic murmur at the apex. RESPIRATORY:  Clear to auscultation without rales, wheezing or rhonchi  ABDOMEN: Soft, non-tender, non-distended MUSCULOSKELETAL:  No edema; No deformity  SKIN: Warm and dry NEUROLOGIC:  Alert and oriented x 3 PSYCHIATRIC:  Normal affect   Signed, Oscar Lindau, MD  06/28/2019 8:46 AM    [facility]

## 2019-06-28 NOTE — Patient Instructions (Signed)
Medication Instructions:  No medication changes *If you need a refill on your cardiac medications before your next appointment, please call your pharmacy*   Lab Work: Your physician recommends that you had a BMET and d-dimer drawn today in the office.  If you have labs (blood work) drawn today and your tests are completely normal, you will receive your results only by: Marland Kitchen MyChart Message (if you have MyChart) OR . A paper copy in the mail If you have any lab test that is abnormal or we need to change your treatment, we will call you to review the results.   Testing/Procedures: Your physician has requested that you have a lexiscan myoview. For further information please visit [email or web address]. Please follow instruction sheet, as given.  The test will take approximately 3 to 4 hours to complete; you may bring reading material.  If someone comes with you to your appointment, they will need to remain in the main lobby due to limited space in the testing area.  How to prepare for your Myocardial Perfusion Test: . Do not eat or drink 3 hours prior to your test, except you may have water. . Do not consume products containing caffeine (regular or decaffeinated) 12 hours prior to your test. (ex: coffee, chocolate, sodas, tea). . Do bring a list of your current medications with you.  If not listed below, you may take your medications as normal. . Do wear comfortable clothes (no dresses or overalls) and walking shoes, tennis shoes preferred (No heels or open toe shoes are allowed). . Do NOT wear cologne, perfume, aftershave, or lotions (deodorant is allowed). . If these instructions are not followed, your test will have to be rescheduled. Your physician has requested that you have an echocardiogram. Echocardiography is a painless test that uses sound waves to create images of your heart. It provides your doctor with information about the size and shape of your heart and how well your heart's chambers  and valves are working. This procedure takes approximately one hour. There are no restrictions for this procedure.     Follow-Up: At [facility], you and your health needs are our priority.  As part of our continuing mission to provide you with exceptional heart care, we have created designated Provider Care Teams.  These Care Teams include your primary Cardiologist (physician) and Advanced Practice Providers (APPs -  Physician Assistants and Nurse Practitioners) who all work together to provide you with the care you need, when you need it.  We recommend signing up for the patient portal called "MyChart".  Sign up information is provided on this After Visit Summary.  MyChart is used to connect with patients for Virtual Visits (Telemedicine).  Patients are able to view lab/test results, encounter notes, upcoming appointments, etc.  Non-urgent messages can be sent to your provider as well.   To learn more about what you can do with MyChart, go to [email or web address].    Your next appointment:   1 month(s)  The format for your next appointment:   In Person  Provider:   Jyl Heinz, MD   Other Instructions  Echocardiogram An echocardiogram is a procedure that uses painless sound waves (ultrasound) to produce an image of the heart. Images from an echocardiogram can provide important information about:  Signs of coronary artery disease (CAD).  Aneurysm detection. An aneurysm is a weak or damaged part of an artery wall that bulges out from the normal force of blood pumping through the body.  Heart size and shape.  Changes in the size or shape of the heart can be associated with certain conditions, including heart failure, aneurysm, and CAD.  Heart muscle function.  Heart valve function.  Signs of a past heart attack.  Fluid buildup around the heart.  Thickening of the heart muscle.  A tumor or infectious growth around the heart valves. Tell a health care provider  about:  Any allergies you have.  All medicines you are taking, including vitamins, herbs, eye drops, creams, and over-the-counter medicines.  Any blood disorders you have.  Any surgeries you have had.  Any medical conditions you have.  Whether you are pregnant or may be pregnant. What are the risks? Generally, this is a safe procedure. However, problems may occur, including:  Allergic reaction to dye (contrast) that may be used during the procedure. What happens before the procedure? No specific preparation is needed. You may eat and drink normally. What happens during the procedure?   An IV tube may be inserted into one of your veins.  You may receive contrast through this tube. A contrast is an injection that improves the quality of the pictures from your heart.  A gel will be applied to your chest.  A wand-like tool (transducer) will be moved over your chest. The gel will help to transmit the sound waves from the transducer.  The sound waves will harmlessly bounce off of your heart to allow the heart images to be captured in real-time motion. The images will be recorded on a computer. The procedure may vary among health care providers and hospitals. What happens after the procedure?  You may return to your normal, everyday life, including diet, activities, and medicines, unless your health care provider tells you not to do that. Summary  An echocardiogram is a procedure that uses painless sound waves (ultrasound) to produce an image of the heart.  Images from an echocardiogram can provide important information about the size and shape of your heart, heart muscle function, heart valve function, and fluid buildup around your heart.  You do not need to do anything to prepare before this procedure. You may eat and drink normally.  After the echocardiogram is completed, you may return to your normal, everyday life, unless your health care provider tells you not to do  that. This information is not intended to replace advice given to you by your health care provider. Make sure you discuss any questions you have with your health care provider. Document Revised: 05/20/2018 Document Reviewed: 03/01/2016 Elsevier Patient Education  [place].

## 2019-06-29 ENCOUNTER — Telehealth: Payer: Self-pay

## 2019-06-29 LAB — BASIC METABOLIC PANEL
BUN/Creatinine Ratio: 15 (ref 10–24)
BUN: 13 mg/dL (ref 8–27)
CO2: 22 mmol/L (ref 20–29)
Calcium: 9.6 mg/dL (ref 8.6–10.2)
Chloride: 103 mmol/L (ref 96–106)
Creatinine, Ser: 0.86 mg/dL (ref 0.76–1.27)
GFR calc Af Amer: 94 mL/min/{1.73_m2} (ref >59)
GFR calc non Af Amer: 81 mL/min/{1.73_m2} (ref >59)
Glucose: 133 mg/dL — ABNORMAL HIGH (ref 65–99)
Potassium: 4 mmol/L (ref 3.5–5.2)
Sodium: 139 mmol/L (ref 134–144)

## 2019-06-29 LAB — D-DIMER, QUANTITATIVE: D-DIMER: 0.4 mg{FEU}/L (ref 0.00–0.49)

## 2019-06-29 NOTE — Telephone Encounter (Signed)
Spoke with patient regarding results and recommendation.  Patient verbalizes understanding and is agreeable to plan of care. Advised patient to call back with any issues or concerns.  

## 2019-06-29 NOTE — Telephone Encounter (Signed)
-----   Message from Jenean Lindau, MD sent at 06/29/2019  8:06 AM EDT ----- The results of the study is unremarkable. Please inform patient. I will discuss in detail at next appointment. Cc  primary care/referring physician Jenean Lindau, MD 06/29/2019 8:06 AM

## 2019-07-01 ENCOUNTER — Telehealth: Payer: Self-pay | Admitting: Cardiology

## 2019-07-01 ENCOUNTER — Other Ambulatory Visit: Payer: Self-pay | Admitting: Cardiology

## 2019-07-01 MED ORDER — LISINOPRIL 10 MG PO TABS: 20.0000 mg | ORAL_TABLET | Freq: Every day | ORAL | 6 refills | Status: DC

## 2019-07-01 NOTE — Telephone Encounter (Signed)
Pt called with elevated BP [id number] to 123XX123 systolic and 95 diastolic   He took NTG with improvement recent labs stable.  I increased lisinopril to 20 mg daily  He had some mild chest pain.  He is for nuc study soon. They will call if BP remains elevated

## 2019-07-04 DIAGNOSIS — A932 Colorado tick fever: Secondary | ICD-10-CM | POA: Diagnosis not present

## 2019-07-06 ENCOUNTER — Encounter: Payer: Self-pay | Admitting: *Deleted

## 2019-07-06 ENCOUNTER — Telehealth: Payer: Self-pay | Admitting: *Deleted

## 2019-07-06 DIAGNOSIS — M5416 Radiculopathy, lumbar region: Secondary | ICD-10-CM | POA: Diagnosis not present

## 2019-07-06 NOTE — Telephone Encounter (Signed)
Patient given detailed instructions per Myocardial Perfusion Study Information Sheet for the test on 07/14/2019 at 0745. Patient notified to arrive 15 minutes early and that it is imperative to arrive on time for appointment to keep from having the test rescheduled.  If you need to cancel or reschedule your appointment, please call the office within 24 hours of your appointment. . Patient verbalized understanding.[place] Mychart letter sent with instructions

## 2019-07-14 ENCOUNTER — Ambulatory Visit (INDEPENDENT_AMBULATORY_CARE_PROVIDER_SITE_OTHER): Payer: Medicare PPO

## 2019-07-14 ENCOUNTER — Other Ambulatory Visit: Payer: Self-pay

## 2019-07-14 VITALS — Ht 71.0 in | Wt 202.0 lb

## 2019-07-14 DIAGNOSIS — R0600 Dyspnea, unspecified: Secondary | ICD-10-CM

## 2019-07-14 DIAGNOSIS — R079 Chest pain, unspecified: Secondary | ICD-10-CM | POA: Diagnosis not present

## 2019-07-14 DIAGNOSIS — I2510 Atherosclerotic heart disease of native coronary artery without angina pectoris: Secondary | ICD-10-CM | POA: Diagnosis not present

## 2019-07-14 DIAGNOSIS — R0789 Other chest pain: Secondary | ICD-10-CM

## 2019-07-14 LAB — MYOCARDIAL PERFUSION IMAGING
LV dias vol: 77 mL (ref 62–150)
LV sys vol: 24 mL
Peak HR: 102 {beats}/min
Rest HR: 57 {beats}/min
SDS: 1
SRS: 0
SSS: 1
TID: 1.05

## 2019-07-14 LAB — ECHOCARDIOGRAM COMPLETE
Height: 71 in
Weight: 3232 oz

## 2019-07-14 MED ORDER — TECHNETIUM TC 99M TETROFOSMIN IV KIT
32.1000 | PACK | Freq: Once | INTRAVENOUS | Status: AC | PRN
  Administered 2019-07-14: 32.1 via INTRAVENOUS

## 2019-07-14 MED ORDER — PERFLUTREN LIPID MICROSPHERE: 1.0000 mL | INTRAVENOUS | 0 refills | Status: DC | PRN

## 2019-07-14 MED ORDER — REGADENOSON 0.4 MG/5ML IV SOLN
0.4000 mg | Freq: Once | INTRAVENOUS | Status: AC
  Administered 2019-07-14: 0.4 mg via INTRAVENOUS

## 2019-07-14 MED ORDER — TECHNETIUM TC 99M TETROFOSMIN IV KIT
10.4000 | PACK | Freq: Once | INTRAVENOUS | Status: AC | PRN
  Administered 2019-07-14: 10.4 via INTRAVENOUS

## 2019-07-14 NOTE — Progress Notes (Signed)
  Echocardiogram 2D Echocardiogram with definity (1.51mL administered) has been performed.  Darlina Sicilian [facility] 07/14/2019 8:48

## 2019-07-19 ENCOUNTER — Telehealth: Payer: Self-pay | Admitting: Cardiology

## 2019-07-19 NOTE — Telephone Encounter (Signed)
Pt aware pf results.

## 2019-07-19 NOTE — Telephone Encounter (Signed)
New Message  Pt is returning call for Echo results   Please call back

## 2019-07-19 NOTE — Addendum Note (Signed)
Addended by: Truddie Hidden on: 07/19/2019 02:17 PM   Modules accepted: Orders

## 2019-07-20 ENCOUNTER — Telehealth: Payer: Self-pay | Admitting: Cardiology

## 2019-07-20 ENCOUNTER — Other Ambulatory Visit: Payer: Self-pay

## 2019-07-20 NOTE — Telephone Encounter (Signed)
[name] daughter called to report that his bp is running high at 151/108.  He recently had his medication increased per Cardiology.  He did take his medication yesterday as prescribed and he also took a NTG x 1.  She was instructed to call Cardiology for instructions since he may need to be seen today for evaluation.  She agreed to call.

## 2019-07-20 NOTE — Telephone Encounter (Signed)
Spoke with [name] (ok per DPR) and verified that we are waiting for prior auth. Questions answered on why the CT was needed. [name] verbalized understanding and had no additional questions.

## 2019-07-20 NOTE — Telephone Encounter (Signed)
Leann the patient's daughter is calling to check on the status of the patient's CT prior authorization. She states she will be available for a callback until 10 AM. If it is after that time please send a patient message on my chart I regards to it.

## 2019-07-24 ENCOUNTER — Other Ambulatory Visit: Payer: Self-pay | Admitting: Family Medicine

## 2019-07-26 ENCOUNTER — Encounter: Payer: Self-pay | Admitting: Gastroenterology

## 2019-07-27 DIAGNOSIS — I77810 Thoracic aortic ectasia: Secondary | ICD-10-CM | POA: Diagnosis not present

## 2019-07-27 DIAGNOSIS — J432 Centrilobular emphysema: Secondary | ICD-10-CM | POA: Diagnosis not present

## 2019-07-27 DIAGNOSIS — I714 Abdominal aortic aneurysm, without rupture: Secondary | ICD-10-CM | POA: Diagnosis not present

## 2019-07-27 DIAGNOSIS — I719 Aortic aneurysm of unspecified site, without rupture: Secondary | ICD-10-CM | POA: Diagnosis not present

## 2019-08-01 ENCOUNTER — Telehealth: Payer: Self-pay | Admitting: Cardiology

## 2019-08-01 NOTE — Telephone Encounter (Signed)
Daughter of the patient called. She wanted to know if she would be able to come with her Father to his appointment tomorrow. The patient has memory issues and a hearing impairment and she would like to make sure she can hear what Dr. Geraldo Pitter has to say. Please let the daughter know what the office decides. If the office can not reach her please try her cell phone [phone]

## 2019-08-01 NOTE — Telephone Encounter (Signed)
Pt and daughter aware that she may accompany him to his appt.

## 2019-08-02 ENCOUNTER — Other Ambulatory Visit: Payer: Self-pay

## 2019-08-02 ENCOUNTER — Encounter: Payer: Self-pay | Admitting: Cardiology

## 2019-08-02 ENCOUNTER — Ambulatory Visit: Payer: Medicare PPO | Admitting: Cardiology

## 2019-08-02 VITALS — BP 172/86 | HR 80 | Ht 71.0 in | Wt 203.0 lb

## 2019-08-02 DIAGNOSIS — I2510 Atherosclerotic heart disease of native coronary artery without angina pectoris: Secondary | ICD-10-CM

## 2019-08-02 DIAGNOSIS — I10 Essential (primary) hypertension: Secondary | ICD-10-CM

## 2019-08-02 DIAGNOSIS — E088 Diabetes mellitus due to underlying condition with unspecified complications: Secondary | ICD-10-CM

## 2019-08-02 DIAGNOSIS — I209 Angina pectoris, unspecified: Secondary | ICD-10-CM

## 2019-08-02 DIAGNOSIS — E7800 Pure hypercholesterolemia, unspecified: Secondary | ICD-10-CM | POA: Diagnosis not present

## 2019-08-02 HISTORY — DX: Angina pectoris, unspecified: I20.9

## 2019-08-02 MED ORDER — ISOSORBIDE MONONITRATE ER 60 MG PO TB24: 90.0000 mg | ORAL_TABLET | Freq: Every day | ORAL | 6 refills | Status: DC

## 2019-08-02 NOTE — Progress Notes (Signed)
Cardiology Office Note:    Date:  08/02/2019   ID:  [name], DOB [birth year], MRN [id number]  PCP:  Rochel Brome, MD  Cardiologist:  Jenean Lindau, MD   Referring MD: Rochel Brome, MD    ASSESSMENT:    1. Coronary artery disease involving native coronary artery of native heart without angina pectoris   2. Essential hypertension   3. Hypercholesteremia   4. Diabetes mellitus due to underlying condition with unspecified complications ([place])   5. Angina pectoris ([place])    PLAN:    In order of problems listed above:  1. Angina pectoris: Patient symptoms are very concerning.  Sublingual nitroglycerin prescription was sent, its protocol and 911 protocol explained and the patient vocalized understanding questions were answered to the patient's satisfaction.  In view of this I discussed the following with him.I discussed coronary angiography and left heart catheterization with the patient at extensive length. Procedure, benefits and potential risks were explained. Patient had multiple questions which were answered to the patient's satisfaction. Patient agreed and consented for the procedure. Further recommendations will be made based on the findings of the coronary angiography. In the interim. The patient has any significant symptoms he knows to go to the nearest emergency room.  In view of his chest tightness symptoms last night we will also get a troponin level. 2. Patient and daughter had multiple questions which were answered to their satisfaction. 3. Also in view of angina I told the patient to start isosorbide 90 mg daily.  His blood pressure is also elevated and this should help his blood pressure issue also. 4. Essential hypertension: Blood pressure is elevated.  He appears mildly anxious. 5. Mixed dyslipidemia and diabetes mellitus: Diet was emphasized and he promises to comply.  He knows to go to nearest emergency room for any concerning symptoms.  Patient and daughter had  multiple questions which were answered to their satisfaction.   Medication Adjustments/Labs and Tests Ordered: Current medicines are reviewed at length with the patient today.  Concerns regarding medicines are outlined above.  No orders of the defined types were placed in this encounter.  No orders of the defined types were placed in this encounter.    No chief complaint on file.    History of Present Illness:    [name] is a 79 y.o. male.  Patient has past medical history of coronary artery disease.  He underwent coronary angiography in 2018 at [facility] and had proximal LAD about 30 to 40% stenosis.  Patient complains of chest tightness on exertion and this affects his quality of life.  I did stress test and echo and they were unremarkable however his symptoms are recurrent and he tells me that he is very scared of the symptoms.  He went to the bathroom yesterday and as he walked up to the bathroom he had tightness in the chest.  No orthopnea or PND.  His daughter accompanies him for this visit.  At the time of my evaluation is alert awake oriented and in no distress.  Past Medical History:  Diagnosis Date  . Arthritis   . Atrial fibrillation ([place])   . Cancer ([place]) 10/2015   prostate cancer reated with radiation  . Cataract   . Colitis 11/22/2018  . 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 Depression 04/04/2019  . Diarrhea 11/21/2018  . Dyslipidemia 06/17/2016  .  Dysrhythmia   . Essential hypertension 06/17/2016  . GERD (gastroesophageal reflux disease)   . Headache   . History of hiatal hernia   . History of kidney stones   . History of prostate cancer 05/20/2019  . Hypercholesteremia 04/04/2019  . Hyperlipemia   . Hypertension   . Impaired fasting blood sugar 05/20/2019  . Malignant neoplasm prostate ([place]) 05/20/2019  . Paroxysmal atrial  fibrillation ([place]) 04/04/2019  . PONV (postoperative nausea and vomiting)   . 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 S/P lumbar spinal fusion 09/29/2013  . Sepsis ([place]) 11/22/2018    Past Surgical History:  Procedure Laterality Date  . anal fissures    . ANTERIOR CERVICAL DECOMP/DISCECTOMY FUSION N/A 03/04/2018   Procedure: Anterior Cervical Decompression Fusion - Cervical three-Cervical four - Cervical four-Cervical five;  Surgeon: Eustace Moore, MD;  Location: [facility];  Service: Neurosurgery;  Laterality: N/A;  . BACK SURGERY    . CARDIAC CATHETERIZATION     no PCI  . CARPAL TUNNEL RELEASE Left 03/04/2018   Procedure: Carpal Tunnel Release - left;  Surgeon: Eustace Moore, MD;  Location: [facility];  Service: Neurosurgery;  Laterality: Left;  . CHOLECYSTECTOMY    . COLONOSCOPY W/ POLYPECTOMY  08/30/2012   Colonic polyp status post polypectomy.  Pancolonic diverticulosis predominantly in the sigmoid colon. Small internal hemorrhoids. Moderate internal hemorhroids.   . ESOPHAGOGASTRODUODENOSCOPY  08/04/2016   Schatzkis ring status post esophageal dilatation. Small hiatal hernia. Mild gastritis.   Marland Kitchen EYE SURGERY     cataract removal bilateral eyes  . FOOT SURGERY     left heel  . HERNIA REPAIR    . SHOULDER SURGERY     bilateral    Current Medications: Current Meds  Medication Sig  . ASPIRIN LOW DOSE 81 MG EC tablet TAKE 1 TABLET BY MOUTH EVERY DAY  . atorvastatin (LIPITOR) 40 MG tablet TAKE 1 TABLET BY MOUTH EVERY DAY  . cholecalciferol (VITAMIN D) 1000 UNITS tablet Take 1,000 Units by mouth daily.  Marland Kitchen gabapentin (NEURONTIN) 300 MG capsule   . Glycerin-Polysorbate 80 (REFRESH DRY EYE THERAPY) 1-1 % SOLN Place 1 drop into both eyes 2 (two) times daily as needed (for dry eyes).   Marland Kitchen HYDROcodone-acetaminophen (NORCO/VICODIN) 5-325 MG tablet   . lisinopril (ZESTRIL) 10 MG tablet Take 2 tablets (20 mg total) by mouth daily.  . meloxicam (MOBIC) 15 MG tablet   . metFORMIN (GLUCOPHAGE) 500 MG  tablet Take 1 tablet (500 mg total) by mouth daily with breakfast.  . nitroGLYCERIN (NITROSTAT) 0.4 MG SL tablet Place 1 tablet (0.4 mg total) under the tongue every 5 (five) minutes x 3 doses as needed for chest pain.  . Omega-3 Fatty Acids (FISH OIL PO) Take 1 capsule by mouth daily.   . pantoprazole (PROTONIX) 40 MG tablet TAKE 1 TABLET BY MOUTH TWICE DAILY  . PERFLUTREN LIPID MICROSPHERE Inject 1-10 mLs into the vein as needed (for use during echocardiogram).  . tamsulosin (FLOMAX) 0.4 MG CAPS capsule Take 0.4 mg by mouth daily after supper.   . vitamin B-12 (CYANOCOBALAMIN) 500 MCG tablet Take 1 tablet by mouth daily.     Allergies:   Norco [hydrocodone-acetaminophen]   Social History   Socioeconomic History  . Marital status: Widowed    Spouse name: Not on file  . Number of children: 2  . Years of education: Not on file  . Highest education level: Not on file  Occupational History  . Not on file  Tobacco  Use  . Smoking status: Former Smoker    Packs/day: 0.50    Years: 35.00    Pack years: 17.50    Types: Cigarettes    Quit date: 02/10/1993    Years since quitting: 26.4  . Smokeless tobacco: Former Systems developer    Types: [place] date: 02/10/2006  Vaping Use  . Vaping Use: Never used  Substance and Sexual Activity  . Alcohol use: Yes    Alcohol/week: 1.0 standard drink    Types: 1 Cans of beer per week    Comment: monthly  . Drug use: No  . Sexual activity: Yes    Partners: Female  Other Topics Concern  . Not on file  Social History Narrative  . Not on file   Social Determinants of Health   Financial Resource Strain:   . Difficulty of Paying Living Expenses:   Food Insecurity:   . Worried About Charity fundraiser in the Last Year:   . Arboriculturist in the Last Year:   Transportation Needs:   . Film/video editor (Medical):   Marland Kitchen Lack of Transportation (Non-Medical):   Physical Activity:   . Days of Exercise per Week:   . Minutes of Exercise per Session:    Stress:   . Feeling of Stress :   Social Connections:   . Frequency of Communication with Friends and Family:   . Frequency of Social Gatherings with Friends and Family:   . Attends Religious Services:   . Active Member of Clubs or Organizations:   . Attends Archivist Meetings:   Marland Kitchen Marital Status:      Family History: The patient's family history is negative for Colon cancer, Esophageal cancer, Rectal cancer, and Stomach cancer.  ROS:   Please see the history of present illness.    All other systems reviewed and are negative.  EKGs/Labs/Other Studies Reviewed:    The following studies were reviewed today: Study Highlights   Nuclear stress EF: 69%.  The left ventricular ejection fraction is hyperdynamic (>65%).  There was no ST segment deviation noted during stress.  This is a low risk study.  No evidence of ischemia or MI  Normal EF.  IMPRESSIONS    1. Sigmoid septum noted.. Left ventricular ejection fraction, by  estimation, is 55 to 60%. The left ventricle has normal function. The left  ventricle has no regional wall motion abnormalities. Left ventricular  diastolic parameters are consistent with  Grade I diastolic dysfunction (impaired relaxation).  2. Right ventricular systolic function is normal. The right ventricular  size is normal. There is normal pulmonary artery systolic pressure.  3. The mitral valve is normal in structure. No evidence of mitral valve  regurgitation. No evidence of mitral stenosis.  4. The aortic valve is normal in structure. Aortic valve regurgitation is  not visualized. No aortic stenosis is present.  5. 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  6. The inferior vena cava is normal in size with greater than 50%  respiratory variability, suggesting right atrial pressure of 3 mmHg.    Recent Labs: 05/25/2019: ALT 46; Hemoglobin 16.4; Platelets 190 06/28/2019: BUN 13; Creatinine,  Ser 0.86; Potassium 4.0; Sodium 139  Recent Lipid Panel    Component Value Date/Time   CHOL 116 05/25/2019 1143   TRIG 90 05/25/2019 1143   HDL 44 05/25/2019 1143   CHOLHDL 2.6 05/25/2019 1143   CHOLHDL 2.6 01/24/2017 0521  VLDL 15 01/24/2017 0521   LDLCALC 55 05/25/2019 1143    Physical Exam:    VS:  BP (!) 172/86   Pulse 80   Ht 5\' 11"  (1.803 m)   Wt 203 lb (92.1 kg)   SpO2 96%   BMI 28.31 kg/m     Wt Readings from Last 3 Encounters:  08/02/19 203 lb (92.1 kg)  07/14/19 202 lb (91.6 kg)  06/28/19 202 lb (91.6 kg)     GEN: Patient is in no acute distress HEENT: Normal NECK: No JVD; No carotid bruits LYMPHATICS: No lymphadenopathy CARDIAC: Hear sounds regular, 2/6 systolic murmur at the apex. RESPIRATORY:  Clear to auscultation without rales, wheezing or rhonchi  ABDOMEN: Soft, non-tender, non-distended MUSCULOSKELETAL:  No edema; No deformity  SKIN: Warm and dry NEUROLOGIC:  Alert and oriented x 3 PSYCHIATRIC:  Normal affect   Signed, Jenean Lindau, MD  08/02/2019 8:48 AM    [place]

## 2019-08-02 NOTE — Patient Instructions (Addendum)
Medication Instructions:  Your physician has recommended you make the following change in your medication:   Start Isosorbide 90 mg daily.  *If you need a refill on your cardiac medications before your next appointment, please call your pharmacy*   Lab Work: Your physician recommends that you have your pre-cath labs today.  If you have labs (blood work) drawn today and your tests are completely normal, you will receive your results only by: Marland Kitchen MyChart Message (if you have MyChart) OR . A paper copy in the mail If you have any lab test that is abnormal or we need to change your treatment, we will call you to review the results.   Testing/Procedures:    [place] [address] Dept: [phone] Loc: [phone]  [name]  08/02/2019  You are scheduled for a Cardiac Catheterization on Wednesday, June 23 with Dr. Larae Grooms.  1. Please arrive at the [facility] (Main Entrance A) at [facility]: [address] [place], [place] [address] at 11:00 AM (This time is two hours before your procedure to ensure your preparation). Free valet parking service is available.   Special note: Every effort is made to have your procedure done on time. Please understand that emergencies sometimes delay scheduled procedures.  2. Diet: Do not eat solid foods after midnight.  The patient may have clear liquids until 5am upon the day of the procedure.  3. Labs: You had labs done in the office today.  4. Medication instructions in preparation for your procedure:   Contrast Allergy: No  Stop taking, Lisinopril (Zestril or Prinivil) Wednesday, June 23, } Do not take Diabetes Med Glucophage (Metformin) on the day of the procedure and HOLD 48 HOURS AFTER THE PROCEDURE.  On the morning of your procedure, take your Aspirin and any morning medicines NOT listed above.  You may use  sips of water.  5. Plan for one night stay--bring personal belongings. 6. Bring a current list of your medications and current insurance cards. 7. You MUST have a responsible person to drive you home. 8. Someone MUST be with you the first 24 hours after you arrive home or your discharge will be delayed. 9. Please wear clothes that are easy to get on and off and wear slip-on shoes.  Thank you for allowing [place] to care for you!   -- [facility]    Follow-Up: At [facility], you and your health needs are our priority.  As part of our continuing mission to provide you with exceptional heart care, we have created designated Provider Care Teams.  These Care Teams include your primary Cardiologist (physician) and Advanced Practice Providers (APPs -  Physician Assistants and Nurse Practitioners) who all work together to provide you with the care you need, when you need it.  We recommend signing up for the patient portal called "MyChart".  Sign up information is provided on this After Visit Summary.  MyChart is used to connect with patients for Virtual Visits (Telemedicine).  Patients are able to view lab/test results, encounter notes, upcoming appointments, etc.  Non-urgent messages can be sent to your provider as well.   To learn more about what you can do with MyChart, go to [email or web address].    Your next appointment:   1 month(s)  The format for your next appointment:   In Person  Provider:   Jyl Heinz, MD   Other  Instructions NA

## 2019-08-02 NOTE — H&P (View-Only) (Signed)
Cardiology Office Note:    Date:  08/02/2019   ID:  [name], DOB 1940/05/13, MRN [id number]  PCP:  Rochel Brome, MD  Cardiologist:  Jenean Lindau, MD   Referring MD: Rochel Brome, MD    ASSESSMENT:    1. Coronary artery disease involving native coronary artery of native heart without angina pectoris   2. Essential hypertension   3. Hypercholesteremia   4. Diabetes mellitus due to underlying condition with unspecified complications ([name])   5. Angina pectoris ([name])    PLAN:    In order of problems listed above:  1. Angina pectoris: Patient symptoms are very concerning.  Sublingual nitroglycerin prescription was sent, its protocol and 911 protocol explained and the patient vocalized understanding questions were answered to the patient's satisfaction.  In view of this I discussed the following with him.I discussed coronary angiography and left heart catheterization with the patient at extensive length. Procedure, benefits and potential risks were explained. Patient had multiple questions which were answered to the patient's satisfaction. Patient agreed and consented for the procedure. Further recommendations will be made based on the findings of the coronary angiography. In the interim. The patient has any significant symptoms he knows to go to the nearest emergency room.  In view of his chest tightness symptoms last night we will also get a troponin level. 2. Patient and daughter had multiple questions which were answered to their satisfaction. 3. Also in view of angina I told the patient to start isosorbide 90 mg daily.  His blood pressure is also elevated and this should help his blood pressure issue also. 4. Essential hypertension: Blood pressure is elevated.  He appears mildly anxious. 5. Mixed dyslipidemia and diabetes mellitus: Diet was emphasized and he promises to comply.  He knows to go to nearest emergency room for any concerning symptoms.  Patient and daughter had  multiple questions which were answered to their satisfaction.   Medication Adjustments/Labs and Tests Ordered: Current medicines are reviewed at length with the patient today.  Concerns regarding medicines are outlined above.  No orders of the defined types were placed in this encounter.  No orders of the defined types were placed in this encounter.    No chief complaint on file.    History of Present Illness:    [name] is a 79 y.o. male.  Patient has past medical history of coronary artery disease.  He underwent coronary angiography in 2018 at [facility] hospital and had proximal LAD about 30 to 40% stenosis.  Patient complains of chest tightness on exertion and this affects his quality of life.  I did stress test and echo and they were unremarkable however his symptoms are recurrent and he tells me that he is very scared of the symptoms.  He went to the bathroom yesterday and as he walked up to the bathroom he had tightness in the chest.  No orthopnea or PND.  His daughter accompanies him for this visit.  At the time of my evaluation is alert awake oriented and in no distress.  Past Medical History:  Diagnosis Date  . Arthritis   . Atrial fibrillation ([name])   . Cancer ([name]) 10/2015   prostate cancer reated with radiation  . Cataract   . Colitis 11/22/2018  . 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 Depression 04/04/2019  . Diarrhea 11/21/2018  . Dyslipidemia 06/17/2016  .  Dysrhythmia   . Essential hypertension 06/17/2016  . GERD (gastroesophageal reflux disease)   . Headache   . History of hiatal hernia   . History of kidney stones   . History of prostate cancer 05/20/2019  . Hypercholesteremia 04/04/2019  . Hyperlipemia   . Hypertension   . Impaired fasting blood sugar 05/20/2019  . Malignant neoplasm prostate ([name]) 05/20/2019  . Paroxysmal atrial  fibrillation ([name]) 04/04/2019  . PONV (postoperative nausea and vomiting)   . 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 S/P lumbar spinal fusion 09/29/2013  . Sepsis ([place]) 11/22/2018    Past Surgical History:  Procedure Laterality Date  . anal fissures    . ANTERIOR CERVICAL DECOMP/DISCECTOMY FUSION N/A 03/04/2018   Procedure: Anterior Cervical Decompression Fusion - Cervical three-Cervical four - Cervical four-Cervical five;  Surgeon: Oscar Moore, MD;  Location: [place];  Service: Neurosurgery;  Laterality: N/A;  . BACK SURGERY    . CARDIAC CATHETERIZATION     no PCI  . CARPAL TUNNEL RELEASE Left 03/04/2018   Procedure: Carpal Tunnel Release - left;  Surgeon: Oscar Moore, MD;  Location: [name];  Service: Neurosurgery;  Laterality: Left;  . CHOLECYSTECTOMY    . COLONOSCOPY W/ POLYPECTOMY  08/30/2012   Colonic polyp status post polypectomy.  Pancolonic diverticulosis predominantly in the sigmoid colon. Small internal hemorrhoids. Moderate internal hemorhroids.   . ESOPHAGOGASTRODUODENOSCOPY  08/04/2016   Schatzkis ring status post esophageal dilatation. Small hiatal hernia. Mild gastritis.   [name] EYE SURGERY     cataract removal bilateral eyes  . FOOT SURGERY     left heel  . HERNIA REPAIR    . SHOULDER SURGERY     bilateral    Current Medications: Current Meds  Medication Sig  . ASPIRIN LOW DOSE 81 MG EC tablet TAKE 1 TABLET BY MOUTH EVERY DAY  . atorvastatin (LIPITOR) 40 MG tablet TAKE 1 TABLET BY MOUTH EVERY DAY  . cholecalciferol (VITAMIN D) 1000 UNITS tablet Take 1,000 Units by mouth daily.  [name] gabapentin (NEURONTIN) 300 MG capsule   . Glycerin-Polysorbate 80 (REFRESH DRY EYE THERAPY) 1-1 % SOLN Place 1 drop into both eyes 2 (two) times daily as needed (for dry eyes).   [name] HYDROcodone-acetaminophen (NORCO/VICODIN) 5-325 MG tablet   . lisinopril (ZESTRIL) 10 MG tablet Take 2 tablets (20 mg total) by mouth daily.  . meloxicam (MOBIC) 15 MG tablet   . metFORMIN (GLUCOPHAGE) 500 MG  tablet Take 1 tablet (500 mg total) by mouth daily with breakfast.  . nitroGLYCERIN (NITROSTAT) 0.4 MG SL tablet Place 1 tablet (0.4 mg total) under the tongue every 5 (five) minutes x 3 doses as needed for chest pain.  . Omega-3 Fatty Acids (FISH OIL PO) Take 1 capsule by mouth daily.   . pantoprazole (PROTONIX) 40 MG tablet TAKE 1 TABLET BY MOUTH TWICE DAILY  . PERFLUTREN LIPID MICROSPHERE Inject 1-10 mLs into the vein as needed (for use during echocardiogram).  . tamsulosin (FLOMAX) 0.4 MG CAPS capsule Take 0.4 mg by mouth daily after supper.   . vitamin B-12 (CYANOCOBALAMIN) 500 MCG tablet Take 1 tablet by mouth daily.     Allergies:   Norco [hydrocodone-acetaminophen]   Social History   Socioeconomic History  . Marital status: Widowed    Spouse name: Not on file  . Number of children: 2  . Years of education: Not on file  . Highest education level: Not on file  Occupational History  . Not on file  Tobacco  Use  . Smoking status: Former Smoker    Packs/day: 0.50    Years: 35.00    Pack years: 17.50    Types: Cigarettes    Quit date: 02/10/1993    Years since quitting: 26.4  . Smokeless tobacco: Former Systems developer    Types: [place] date: 02/10/2006  Vaping Use  . Vaping Use: Never used  Substance and Sexual Activity  . Alcohol use: Yes    Alcohol/week: 1.0 standard drink    Types: 1 Cans of beer per week    Comment: monthly  . Drug use: No  . Sexual activity: Yes    Partners: Female  Other Topics Concern  . Not on file  Social History Narrative  . Not on file   Social Determinants of Health   Financial Resource Strain:   . Difficulty of Paying Living Expenses:   Food Insecurity:   . Worried About Charity fundraiser in the Last Year:   . Arboriculturist in the Last Year:   Transportation Needs:   . Film/video editor (Medical):   [name] Lack of Transportation (Non-Medical):   Physical Activity:   . Days of Exercise per Week:   . Minutes of Exercise per Session:     Stress:   . Feeling of Stress :   Social Connections:   . Frequency of Communication with Friends and Family:   . Frequency of Social Gatherings with Friends and Family:   . Attends Religious Services:   . Active Member of Clubs or Organizations:   . Attends Archivist Meetings:   [name] Marital Status:      Family History: The patient's family history is negative for Colon cancer, Esophageal cancer, Rectal cancer, and Stomach cancer.  ROS:   Please see the history of present illness.    All other systems reviewed and are negative.  EKGs/Labs/Other Studies Reviewed:    The following studies were reviewed today: Study Highlights   Nuclear stress EF: 69%.  The left ventricular ejection fraction is hyperdynamic (>65%).  There was no ST segment deviation noted during stress.  This is a low risk study.  No evidence of ischemia or MI  Normal EF.  IMPRESSIONS    1. Sigmoid septum noted.. Left ventricular ejection fraction, by  estimation, is 55 to 60%. The left ventricle has normal function. The left  ventricle has no regional wall motion abnormalities. Left ventricular  diastolic parameters are consistent with  Grade I diastolic dysfunction (impaired relaxation).  2. Right ventricular systolic function is normal. The right ventricular  size is normal. There is normal pulmonary artery systolic pressure.  3. The mitral valve is normal in structure. No evidence of mitral valve  regurgitation. No evidence of mitral stenosis.  4. The aortic valve is normal in structure. Aortic valve regurgitation is  not visualized. No aortic stenosis is present.  5. 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  6. The inferior vena cava is normal in size with greater than 50%  respiratory variability, suggesting right atrial pressure of 3 mmHg.    Recent Labs: 05/25/2019: ALT 46; Hemoglobin 16.4; Platelets 190 06/28/2019: BUN 13; Creatinine,  Ser 0.86; Potassium 4.0; Sodium 139  Recent Lipid Panel    Component Value Date/Time   CHOL 116 05/25/2019 1143   TRIG 90 05/25/2019 1143   HDL 44 05/25/2019 1143   CHOLHDL 2.6 05/25/2019 1143   CHOLHDL 2.6 01/24/2017  0521   VLDL 15 01/24/2017 0521   LDLCALC 55 05/25/2019 1143    Physical Exam:    VS:  BP (!) 172/86   Pulse 80   Ht 5\' 11"  (1.803 m)   Wt 203 lb (92.1 kg)   SpO2 96%   BMI 28.31 kg/m     Wt Readings from Last 3 Encounters:  08/02/19 203 lb (92.1 kg)  07/14/19 202 lb (91.6 kg)  06/28/19 202 lb (91.6 kg)     GEN: Patient is in no acute distress HEENT: Normal NECK: No JVD; No carotid bruits LYMPHATICS: No lymphadenopathy CARDIAC: Hear sounds regular, 2/6 systolic murmur at the apex. RESPIRATORY:  Clear to auscultation without rales, wheezing or rhonchi  ABDOMEN: Soft, non-tender, non-distended MUSCULOSKELETAL:  No edema; No deformity  SKIN: Warm and dry NEUROLOGIC:  Alert and oriented x 3 PSYCHIATRIC:  Normal affect   Signed, Jenean Lindau, MD  08/02/2019 8:48 AM    [place]

## 2019-08-03 ENCOUNTER — Other Ambulatory Visit: Payer: Self-pay

## 2019-08-03 ENCOUNTER — Encounter (HOSPITAL_COMMUNITY)
Admission: RE | Disposition: A | Discharge status: Home / Self Care | Payer: Self-pay | Source: Home / Self Care | Attending: Interventional Cardiology

## 2019-08-03 ENCOUNTER — Ambulatory Visit (HOSPITAL_COMMUNITY)
Admission: RE | Admit: 2019-08-03 | Discharge: 2019-08-03 | Disposition: A | Discharge status: Home / Self Care | Payer: Medicare PPO | Attending: Interventional Cardiology | Admitting: Interventional Cardiology

## 2019-08-03 DIAGNOSIS — I2510 Atherosclerotic heart disease of native coronary artery without angina pectoris: Secondary | ICD-10-CM

## 2019-08-03 DIAGNOSIS — E785 Hyperlipidemia, unspecified: Secondary | ICD-10-CM | POA: Diagnosis not present

## 2019-08-03 DIAGNOSIS — E7800 Pure hypercholesterolemia, unspecified: Secondary | ICD-10-CM | POA: Diagnosis not present

## 2019-08-03 DIAGNOSIS — M1990 Unspecified osteoarthritis, unspecified site: Secondary | ICD-10-CM | POA: Diagnosis not present

## 2019-08-03 DIAGNOSIS — I480 Paroxysmal atrial fibrillation: Secondary | ICD-10-CM | POA: Diagnosis not present

## 2019-08-03 DIAGNOSIS — Z79899 Other long term (current) drug therapy: Secondary | ICD-10-CM | POA: Diagnosis not present

## 2019-08-03 DIAGNOSIS — E088 Diabetes mellitus due to underlying condition with unspecified complications: Secondary | ICD-10-CM

## 2019-08-03 DIAGNOSIS — Z87891 Personal history of nicotine dependence: Secondary | ICD-10-CM | POA: Diagnosis not present

## 2019-08-03 DIAGNOSIS — Z885 Allergy status to narcotic agent status: Secondary | ICD-10-CM | POA: Insufficient documentation

## 2019-08-03 DIAGNOSIS — E1136 Type 2 diabetes mellitus with diabetic cataract: Secondary | ICD-10-CM | POA: Diagnosis not present

## 2019-08-03 DIAGNOSIS — I25118 Atherosclerotic heart disease of native coronary artery with other forms of angina pectoris: Secondary | ICD-10-CM | POA: Insufficient documentation

## 2019-08-03 DIAGNOSIS — Z7982 Long term (current) use of aspirin: Secondary | ICD-10-CM | POA: Insufficient documentation

## 2019-08-03 DIAGNOSIS — E782 Mixed hyperlipidemia: Secondary | ICD-10-CM | POA: Diagnosis not present

## 2019-08-03 DIAGNOSIS — K219 Gastro-esophageal reflux disease without esophagitis: Secondary | ICD-10-CM | POA: Insufficient documentation

## 2019-08-03 DIAGNOSIS — Z7984 Long term (current) use of oral hypoglycemic drugs: Secondary | ICD-10-CM | POA: Insufficient documentation

## 2019-08-03 DIAGNOSIS — I10 Essential (primary) hypertension: Secondary | ICD-10-CM | POA: Diagnosis not present

## 2019-08-03 HISTORY — PX: LEFT HEART CATH AND CORONARY ANGIOGRAPHY: CATH118249

## 2019-08-03 LAB — CBC WITH DIFFERENTIAL/PLATELET
Basophils Absolute: 0 10*3/uL (ref 0.0–0.2)
Basos: 0 %
EOS (ABSOLUTE): 0.1 10*3/uL (ref 0.0–0.4)
Eos: 2 %
Hematocrit: 47.7 % (ref 37.5–51.0)
Hemoglobin: 16.3 g/dL (ref 13.0–17.7)
Immature Grans (Abs): 0 10*3/uL (ref 0.0–0.1)
Immature Granulocytes: 0 %
Lymphocytes Absolute: 1.2 10*3/uL (ref 0.7–3.1)
Lymphs: 18 %
MCH: 30.1 pg (ref 26.6–33.0)
MCHC: 34.2 g/dL (ref 31.5–35.7)
MCV: 88 fL (ref 79–97)
Monocytes Absolute: 0.7 10*3/uL (ref 0.1–0.9)
Monocytes: 11 %
Neutrophils Absolute: 4.4 10*3/uL (ref 1.4–7.0)
Neutrophils: 69 %
Platelets: 206 10*3/uL (ref 150–450)
RBC: 5.41 x10E6/uL (ref 4.14–5.80)
RDW: 13 % (ref 11.6–15.4)
WBC: 6.4 10*3/uL (ref 3.4–10.8)

## 2019-08-03 LAB — BASIC METABOLIC PANEL
BUN/Creatinine Ratio: 17 (ref 10–24)
BUN: 16 mg/dL (ref 8–27)
CO2: 19 mmol/L — ABNORMAL LOW (ref 20–29)
Calcium: 9.8 mg/dL (ref 8.6–10.2)
Chloride: 103 mmol/L (ref 96–106)
Creatinine, Ser: 0.93 mg/dL (ref 0.76–1.27)
GFR calc Af Amer: 89 mL/min/{1.73_m2} (ref >59)
GFR calc non Af Amer: 77 mL/min/{1.73_m2} (ref >59)
Glucose: 103 mg/dL — ABNORMAL HIGH (ref 65–99)
Potassium: 4.2 mmol/L (ref 3.5–5.2)
Sodium: 137 mmol/L (ref 134–144)

## 2019-08-03 SURGERY — LEFT HEART CATH AND CORONARY ANGIOGRAPHY
Anesthesia: LOCAL

## 2019-08-03 MED ORDER — SODIUM CHLORIDE 0.9 % IV SOLN: 250.0000 mL | INTRAVENOUS | Status: DC | PRN

## 2019-08-03 MED ORDER — SODIUM CHLORIDE 0.9 % WEIGHT BASED INFUSION: 1.0000 mL/kg/h | INTRAVENOUS | Status: DC

## 2019-08-03 MED ORDER — NITROGLYCERIN 0.4 MG SL SUBL: 0.4000 mg | SUBLINGUAL_TABLET | SUBLINGUAL | Status: DC | PRN

## 2019-08-03 MED ORDER — LABETALOL HCL 5 MG/ML IV SOLN: 10.0000 mg | INTRAVENOUS | Status: DC | PRN

## 2019-08-03 MED ORDER — ACETAMINOPHEN 325 MG PO TABS: 650.0000 mg | ORAL_TABLET | ORAL | Status: DC | PRN

## 2019-08-03 MED ORDER — HEPARIN SODIUM (PORCINE) 1000 UNIT/ML IJ SOLN
INTRAMUSCULAR | Status: DC | PRN
  Administered 2019-08-03: 4500 [IU] via INTRAVENOUS

## 2019-08-03 MED ORDER — HYDRALAZINE HCL 20 MG/ML IJ SOLN: 10.0000 mg | INTRAMUSCULAR | Status: DC | PRN

## 2019-08-03 MED ORDER — MIDAZOLAM HCL 2 MG/2ML IJ SOLN
INTRAMUSCULAR | Status: DC | PRN
  Administered 2019-08-03: 2 mg via INTRAVENOUS

## 2019-08-03 MED ORDER — SODIUM CHLORIDE 0.9% FLUSH: 3.0000 mL | INTRAVENOUS | Status: DC | PRN

## 2019-08-03 MED ORDER — SODIUM CHLORIDE 0.9% FLUSH: 3.0000 mL | Freq: Two times a day (BID) | INTRAVENOUS | Status: DC

## 2019-08-03 MED ORDER — FENTANYL CITRATE (PF) 100 MCG/2ML IJ SOLN
INTRAMUSCULAR | Status: DC | PRN
  Administered 2019-08-03: 25 ug via INTRAVENOUS

## 2019-08-03 MED ORDER — LIDOCAINE HCL (PF) 1 % IJ SOLN
INTRAMUSCULAR | Status: AC
  Filled 2019-08-03: qty 30

## 2019-08-03 MED ORDER — HEPARIN (PORCINE) IN NACL 1000-0.9 UT/500ML-% IV SOLN
INTRAVENOUS | Status: DC | PRN
  Administered 2019-08-03 (×2): 500 mL

## 2019-08-03 MED ORDER — ASPIRIN 81 MG PO CHEW: 81.0000 mg | CHEWABLE_TABLET | ORAL | Status: DC

## 2019-08-03 MED ORDER — SODIUM CHLORIDE 0.9 % IV SOLN: INTRAVENOUS | Status: AC

## 2019-08-03 MED ORDER — ONDANSETRON HCL 4 MG/2ML IJ SOLN: 4.0000 mg | Freq: Four times a day (QID) | INTRAMUSCULAR | Status: DC | PRN

## 2019-08-03 MED ORDER — IOHEXOL 350 MG/ML SOLN
INTRAVENOUS | Status: DC | PRN
  Administered 2019-08-03: 55 mL

## 2019-08-03 MED ORDER — VERAPAMIL HCL 2.5 MG/ML IV SOLN
INTRAVENOUS | Status: DC | PRN
  Administered 2019-08-03: 10 mL via INTRA_ARTERIAL

## 2019-08-03 MED ORDER — NITROGLYCERIN 0.4 MG SL SUBL
SUBLINGUAL_TABLET | SUBLINGUAL | Status: AC
  Administered 2019-08-03: 0.4 mg
  Filled 2019-08-03: qty 1

## 2019-08-03 MED ORDER — HEPARIN (PORCINE) IN NACL 1000-0.9 UT/500ML-% IV SOLN
INTRAVENOUS | Status: AC
  Filled 2019-08-03: qty 1000

## 2019-08-03 MED ORDER — HEPARIN SODIUM (PORCINE) 1000 UNIT/ML IJ SOLN
INTRAMUSCULAR | Status: AC
  Filled 2019-08-03: qty 1

## 2019-08-03 MED ORDER — FENTANYL CITRATE (PF) 100 MCG/2ML IJ SOLN
INTRAMUSCULAR | Status: AC
  Filled 2019-08-03: qty 2

## 2019-08-03 MED ORDER — SODIUM CHLORIDE 0.9 % WEIGHT BASED INFUSION
3.0000 mL/kg/h | INTRAVENOUS | Status: AC
  Administered 2019-08-03: 3 mL/kg/h via INTRAVENOUS

## 2019-08-03 MED ORDER — LIDOCAINE HCL (PF) 1 % IJ SOLN
INTRAMUSCULAR | Status: DC | PRN
  Administered 2019-08-03: 2 mL

## 2019-08-03 MED ORDER — VERAPAMIL HCL 2.5 MG/ML IV SOLN
INTRAVENOUS | Status: AC
  Filled 2019-08-03: qty 2

## 2019-08-03 MED ORDER — MIDAZOLAM HCL 2 MG/2ML IJ SOLN
INTRAMUSCULAR | Status: AC
  Filled 2019-08-03: qty 2

## 2019-08-03 SURGICAL SUPPLY — 10 items
CATH 5FR JL3.5 JR4 ANG PIG MP (CATHETERS) ×1 IMPLANT
DEVICE RAD COMP TR BAND LRG (VASCULAR PRODUCTS) ×1 IMPLANT
GLIDESHEATH SLEND SS 6F .021 (SHEATH) ×1 IMPLANT
GUIDEWIRE INQWIRE 1.5J.035X260 (WIRE) IMPLANT
INQWIRE 1.5J .035X260CM (WIRE) ×2
KIT HEART LEFT (KITS) ×2 IMPLANT
PACK CARDIAC CATHETERIZATION (CUSTOM PROCEDURE TRAY) ×2 IMPLANT
TRANSDUCER W/STOPCOCK (MISCELLANEOUS) ×2 IMPLANT
TUBING CIL FLEX 10 FLL-RA (TUBING) ×2 IMPLANT
WIRE HI TORQ VERSACORE-J 145CM (WIRE) ×1 IMPLANT

## 2019-08-03 NOTE — Interval H&P Note (Signed)
Cath Lab Visit (complete for each Cath Lab visit)  Clinical Evaluation Leading to the Procedure:   ACS: No.  Non-ACS:    Anginal Classification: CCS III  Anti-ischemic medical therapy: Minimal Therapy (1 class of medications)  Non-Invasive Test Results: No non-invasive testing performed  Prior CABG: No previous CABG      History and Physical Interval Note:  08/03/2019 12:50 PM  [name]  has presented today for surgery, with the diagnosis of angina.  The various methods of treatment have been discussed with the patient and family. After consideration of risks, benefits and other options for treatment, the patient has consented to  Procedure(s): LEFT HEART CATH AND CORONARY ANGIOGRAPHY (N/A) as a surgical intervention.  The patient's history has been reviewed, patient examined, no change in status, stable for surgery.  I have reviewed the patient's chart and labs.  Questions were answered to the patient's satisfaction.     Larae Grooms

## 2019-08-03 NOTE — Discharge Instructions (Signed)
HOLD MEFORMIN// RESTART ON Saturday   Radial Site Care  This sheet gives you information about how to care for yourself after your procedure. Your health care provider may also give you more specific instructions. If you have problems or questions, contact your health care provider. What can I expect after the procedure? After the procedure, it is common to have:  Bruising and tenderness at the catheter insertion area. Follow these instructions at home: Medicines  Take over-the-counter and prescription medicines only as told by your health care provider. Insertion site care  Follow instructions from your health care provider about how to take care of your insertion site. Make sure you: ? Wash your hands with soap and water before you change your bandage (dressing). If soap and water are not available, use hand sanitizer. ? Change your dressing as told by your health care provider. ? Leave stitches (sutures), skin glue, or adhesive strips in place. These skin closures may need to stay in place for 2 weeks or longer. If adhesive strip edges start to loosen and curl up, you may trim the loose edges. Do not remove adhesive strips completely unless your health care provider tells you to do that.  Check your insertion site every day for signs of infection. Check for: ? Redness, swelling, or pain. ? Fluid or blood. ? Pus or a bad smell. ? Warmth.  Do not take baths, swim, or use a hot tub until your health care provider approves.  You may shower 24-48 hours after the procedure, or as directed by your health care provider. ? Remove the dressing and gently wash the site with plain soap and water. ? Pat the area dry with a clean towel. ? Do not rub the site. That could cause bleeding.  Do not apply powder or lotion to the site. Activity   For 24 hours after the procedure, or as directed by your health care provider: ? Do not flex or bend the affected arm. ? Do not push or pull heavy  objects with the affected arm. ? Do not drive yourself home from the hospital or clinic. You may drive 24 hours after the procedure unless your health care provider tells you not to. ? Do not operate machinery or power tools.  Do not lift anything that is heavier than 10 lb (4.5 kg), or the limit that you are told, until your health care provider says that it is safe.  Ask your health care provider when it is okay to: ? Return to work or school. ? Resume usual physical activities or sports. ? Resume sexual activity. General instructions  If the catheter site starts to bleed, raise your arm and put firm pressure on the site. If the bleeding does not stop, get help right away. This is a medical emergency.  If you went home on the same day as your procedure, a responsible adult should be with you for the first 24 hours after you arrive home.  Keep all follow-up visits as told by your health care provider. This is important. Contact a health care provider if:  You have a fever.  You have redness, swelling, or yellow drainage around your insertion site. Get help right away if:  You have unusual pain at the radial site.  The catheter insertion area swells very fast.  The insertion area is bleeding, and the bleeding does not stop when you hold steady pressure on the area.  Your arm or hand becomes pale, cool, tingly, or numb.  These symptoms may represent a serious problem that is an emergency. Do not wait to see if the symptoms will go away. Get medical help right away. Call your local emergency services (911 in the [place] Do not drive yourself to the hospital. Summary  After the procedure, it is common to have bruising and tenderness at the site.  Follow instructions from your health care provider about how to take care of your radial site wound. Check the wound every day for signs of infection.  Do not lift anything that is heavier than 10 lb (4.5 kg), or the limit that you are told,  until your health care provider says that it is safe. This information is not intended to replace advice given to you by your health care provider. Make sure you discuss any questions you have with your health care provider. Document Revised: 03/04/2017 Document Reviewed: 03/04/2017 Elsevier Patient Education  2020 [employer].

## 2019-08-04 ENCOUNTER — Telehealth: Payer: Self-pay | Admitting: Cardiology

## 2019-08-04 ENCOUNTER — Encounter (HOSPITAL_COMMUNITY): Payer: Self-pay | Admitting: Interventional Cardiology

## 2019-08-04 NOTE — Telephone Encounter (Signed)
New message   Patient's daughter states that isosorbide mononitrate (IMDUR) 60 MG 24 hr tablet may be causing lightheadness and his skin to be clammy. Please call to discuss.

## 2019-08-04 NOTE — Telephone Encounter (Signed)
Spoke with the pt show states that he has been feeling lightheaded and clammy today. Pt states that his BP has been low 97/50 HR 98. Pt was advised to drink gatorade and eat a salty snack. Pt states less than an hour ago his heartrate was 151. Pt was advised to rest with his feet up, hydrate and eat a salty snack to see how he is doing in an hour. Pt advised to go to the ED for continued fast heart rate. Pt verbalized understanding and had no additional questions.

## 2019-08-05 ENCOUNTER — Telehealth: Payer: Self-pay | Admitting: Cardiology

## 2019-08-05 MED ORDER — LISINOPRIL 20 MG PO TABS: 20.0000 mg | ORAL_TABLET | Freq: Every day | ORAL | 3 refills | Status: DC

## 2019-08-05 NOTE — Telephone Encounter (Signed)
Rx sent in to [facility] as pt requsted.

## 2019-08-05 NOTE — Telephone Encounter (Signed)
Follow Up:  Pt wants to know what does he need to take for his blood pressure? It is still running high, he took some Lisinopril this morning.

## 2019-08-05 NOTE — Telephone Encounter (Signed)
Pt c/o medication issue:  1. Name of Medication: isosorbide mononitrate (IMDUR) 60 MG 24 hr tablet  2. How are you currently taking this medication (dosage and times per day)? Was told to stop taking on 6/24.   3. Are you having a reaction (difficulty breathing--STAT)? N/A  4. What is your medication issue? Patient was told to stop taking isosorbide mononitrate (IMDUR) 60 MG 24 hr tablet because patient started to feel bad. Was previously on lisinopril (ZESTRIL) 10 MG tablet. Patient is wanting to know if he needed to stop the Isosorbide completely or temporarily.

## 2019-08-05 NOTE — Telephone Encounter (Signed)
Please see additional phone call, Reuben Likes, RN was addressing this.

## 2019-08-05 NOTE — Telephone Encounter (Signed)
Just spoke with [name] who states that his BP today is 130/80 HR 81. Pt states that he feels better today but still short of breath. Pt states that he thought he had taken lisinopril this am but did not. He would like to know what to take for his BP since we stopped his Imdur yesterday. How do you advise?

## 2019-08-05 NOTE — Addendum Note (Signed)
Addended by: Truddie Hidden on: 08/05/2019 02:55 PM   Modules accepted: Orders

## 2019-08-05 NOTE — Telephone Encounter (Signed)
He can continue lisinopril.  Keep [place] updated about blood pressures.

## 2019-08-19 ENCOUNTER — Other Ambulatory Visit: Payer: Self-pay | Admitting: Family Medicine

## 2019-08-31 ENCOUNTER — Ambulatory Visit (INDEPENDENT_AMBULATORY_CARE_PROVIDER_SITE_OTHER): Payer: Medicare PPO | Admitting: Family Medicine

## 2019-08-31 ENCOUNTER — Encounter: Payer: Self-pay | Admitting: Family Medicine

## 2019-08-31 ENCOUNTER — Other Ambulatory Visit: Payer: Self-pay

## 2019-08-31 VITALS — BP 134/78 | HR 74 | Temp 97.3°F | Ht 71.0 in | Wt 191.0 lb

## 2019-08-31 DIAGNOSIS — E088 Diabetes mellitus due to underlying condition with unspecified complications: Secondary | ICD-10-CM | POA: Diagnosis not present

## 2019-08-31 DIAGNOSIS — I10 Essential (primary) hypertension: Secondary | ICD-10-CM

## 2019-08-31 DIAGNOSIS — E782 Mixed hyperlipidemia: Secondary | ICD-10-CM

## 2019-08-31 HISTORY — DX: Mixed hyperlipidemia: E78.2

## 2019-08-31 LAB — POCT UA - MICROALBUMIN: Microalbumin Ur, POC: 30 mg/L

## 2019-08-31 MED ORDER — GABAPENTIN 300 MG PO CAPS: 300.0000 mg | ORAL_CAPSULE | Freq: Two times a day (BID) | ORAL | 1 refills | Status: DC

## 2019-08-31 NOTE — Patient Instructions (Addendum)
Meloxicam(Mobic)-inflammation-TAKE WITH FOOD Gabapentin(Neurontin) -nerve pain-CAN CAUSE DROWSINESS-twice a day Norco/vicodin(hydrocodone-acetaminophen)-pain medication labwork today-will call with results Keep cardiology appt

## 2019-08-31 NOTE — Progress Notes (Signed)
Established Patient Office Visit  Subjective:  Patient ID: [name], male    DOB: 18-May-1940  Age: 79 y.o. MRN: [id number]  CC:  Chief Complaint  Patient presents with  . Hypertension  . Hyperlipidemia  . Gastroesophageal Reflux  . Back Pain  cath  Prox Cx lesion is 30% stenosed.  Prox LAD to Mid LAD lesion is 40% stenosed.  Mid LAD lesion is 25% stenosed.  The left ventricular systolic function is normal.  LV end diastolic pressure is normal.  The left ventricular ejection fraction is 55-65% by visual estimate.  There is no aortic valve stenosis.  Nonobstructive CAD.  Continue preventive therapy.  echoSigmoid septum noted.. Left ventricular ejection fraction, by estimation, is 55 to 60%. The left ventricle has normal function. The left ventricle has no regional wall motion abnormalities. Left ventricular diastolic parameters are consistent with Grade I diastolic dysfunction (impaired relaxation). 2. Right ventricular systolic function is normal. The right ventricular size is normal. There is normal pulmonary artery systolic pressure. 3. The mitral valve is normal in structure. No evidence of mitral valve regurgitation. No evidence of mitral stenosis. 4. The aortic valve is normal in structure. Aortic valve regurgitation is not visualized. No aortic stenosis is present. 5. 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 6. The inferior vena cava is normal in size with greater than 50% respiratory variability, suggesting right atrial pressure of 3 mmHg. Left Ventricle: Sigmoid septum noted. Left ventricular ejection fraction, by estimation, is 55 to 60%. The left ventricle has normal function. The left ventricle has no regional wall motion abnormalities. Definity contrast agent was given IV to delineate the left ventricular endocardial borders. The left ventricular internal cavity size was normal in size. There is no left ventricular  hypertrophy. Left ventricular diastolic parameters are consistent with Grade I diastolic dysfunction (impaired relaxation). Right Ventricle: The right ventricular size is normal. No increase in right ventricular wall thickness. Right ventricular systolic function is normal. There is normal pulmonary artery systolic pressure. The tricuspid regurgitant velocity is 2.36 m/s, and with an assumed right atrial pressure of 3 mmHg, the estimated righ HPI [name] presents for HTN/Hyperlipidemia/GERD Pt sees Dr. Devoria Glassing surgeon not recommended currently Pt with increase in pain symptoms over the last few days. Pt states injections helped in the past-pt has not tried NSAIDs  CAD-MI noted on echo/aneurysm/ EF--55-65%-non obstructive CAD Past Medical History:  Diagnosis Date  . Arthritis   . Atrial fibrillation ([place])   . Cancer ([place]) 10/2015   prostate cancer reated with radiation  . Cataract   . Colitis 11/22/2018  . 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 Depression 04/04/2019  . Diarrhea 11/21/2018  . Dyslipidemia 06/17/2016  . Dysrhythmia   . Essential hypertension 06/17/2016  . GERD (gastroesophageal reflux disease)   . Headache   . History of hiatal hernia   . History of kidney stones   . History of prostate cancer 05/20/2019  . Hypercholesteremia 04/04/2019  . Hyperlipemia   . Hypertension   . Impaired fasting blood sugar 05/20/2019  . Malignant neoplasm prostate ([place]) 05/20/2019  . Paroxysmal atrial fibrillation ([place]) 04/04/2019  . PONV (postoperative nausea and vomiting)   . 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 S/P lumbar spinal fusion 09/29/2013  . Sepsis ([place]) 11/22/2018    Past Surgical History:  Procedure Laterality Date  .  anal fissures    . ANTERIOR CERVICAL DECOMP/DISCECTOMY FUSION N/A 03/04/2018   Procedure: Anterior Cervical Decompression Fusion -  Cervical three-Cervical four - Cervical four-Cervical five;  Surgeon: Eustace Moore, MD;  Location: [facility];  Service: Neurosurgery;  Laterality: N/A;  . BACK SURGERY    . CARDIAC CATHETERIZATION     no PCI  . CARPAL TUNNEL RELEASE Left 03/04/2018   Procedure: Carpal Tunnel Release - left;  Surgeon: Eustace Moore, MD;  Location: [facility];  Service: Neurosurgery;  Laterality: Left;  . CHOLECYSTECTOMY    . COLONOSCOPY W/ POLYPECTOMY  08/30/2012   Colonic polyp status post polypectomy.  Pancolonic diverticulosis predominantly in the sigmoid colon. Small internal hemorrhoids. Moderate internal hemorhroids.   . ESOPHAGOGASTRODUODENOSCOPY  08/04/2016   Schatzkis ring status post esophageal dilatation. Small hiatal hernia. Mild gastritis.   Marland Kitchen EYE SURGERY     cataract removal bilateral eyes  . FOOT SURGERY     left heel  . HERNIA REPAIR    . LEFT HEART CATH AND CORONARY ANGIOGRAPHY N/A 08/03/2019   Procedure: LEFT HEART CATH AND CORONARY ANGIOGRAPHY;  Surgeon: Jettie Booze, MD;  Location: [facility];  Service: Cardiovascular;  Laterality: N/A;  . SHOULDER SURGERY     bilateral    Family History  Problem Relation Age of Onset  . Colon cancer Neg Hx   . Esophageal cancer Neg Hx   . Rectal cancer Neg Hx   . Stomach cancer Neg Hx     Social History   Socioeconomic History  . Marital status: Widowed    Spouse name: Not on file  . Number of children: 2  . Years of education: Not on file  . Highest education level: Not on file  Occupational History  . Not on file  Tobacco Use  . Smoking status: Former Smoker    Packs/day: 0.50    Years: 35.00    Pack years: 17.50    Types: Cigarettes    Quit date: 02/10/1993    Years since quitting: 26.5  . Smokeless tobacco: Former Systems developer    Types: [place] date: 02/10/2006  Vaping Use  . Vaping Use: Never used  Substance and Sexual Activity  . Alcohol use: Yes    Alcohol/week: 1.0 standard drink    Types: 1 Cans of beer per week     Comment: monthly  . Drug use: No  . Sexual activity: Yes    Partners: Female  Other Topics Concern  . Not on file  Social History Narrative  . Not on file   Social Determinants of Health   Financial Resource Strain:   . Difficulty of Paying Living Expenses:   Food Insecurity:   . Worried About Charity fundraiser in the Last Year:   . Arboriculturist in the Last Year:   Transportation Needs:   . Film/video editor (Medical):   Marland Kitchen Lack of Transportation (Non-Medical):   Physical Activity:   . Days of Exercise per Week:   . Minutes of Exercise per Session:   Stress:   . Feeling of Stress :   Social Connections:   . Frequency of Communication with Friends and Family:   . Frequency of Social Gatherings with Friends and Family:   . Attends Religious Services:   . Active Member of Clubs or Organizations:   . Attends Archivist Meetings:   Marland Kitchen Marital Status:   Intimate Partner Violence:   .  Fear of Current or Ex-Partner:   . Emotionally Abused:   Marland Kitchen Physically Abused:   . Sexually Abused:     Outpatient Medications Prior to Visit  Medication Sig Dispense Refill  . ASPIRIN LOW DOSE 81 MG EC tablet TAKE 1 TABLET BY MOUTH EVERY DAY (Patient taking differently: Take 81 mg by mouth daily. ) 90 tablet 1  . atorvastatin (LIPITOR) 40 MG tablet TAKE 1 TABLET BY MOUTH EVERY DAY (Patient taking differently: Take 10 mg by mouth daily. ) 90 tablet 1  . cholecalciferol (VITAMIN D) 1000 UNITS tablet Take 1,000 Units by mouth daily.    . cyanocobalamin 1000 MCG tablet Take 1,000 mcg by mouth daily.     Marland Kitchen gabapentin (NEURONTIN) 300 MG capsule Take 300 mg by mouth daily.     . Glycerin-Polysorbate 80 (REFRESH DRY EYE THERAPY) 1-1 % SOLN Place 1 drop into both eyes 2 (two) times daily as needed (for dry eyes).     Marland Kitchen HYDROcodone-acetaminophen (NORCO/VICODIN) 5-325 MG tablet Take 1 tablet by mouth every 6 (six) hours as needed for moderate pain or severe pain.     Marland Kitchen lisinopril  (ZESTRIL) 20 MG tablet Take 1 tablet (20 mg total) by mouth daily. 90 tablet 3  . meloxicam (MOBIC) 15 MG tablet Take 15 mg by mouth daily.     . metFORMIN (GLUCOPHAGE) 500 MG tablet TAKE 1 TABLET BY MOUTH EVERY DAY WITH BREAKFAST 90 tablet 0  . nitroGLYCERIN (NITROSTAT) 0.4 MG SL tablet Place 1 tablet (0.4 mg total) under the tongue every 5 (five) minutes x 3 doses as needed for chest pain. 25 tablet 12  . Omega-3 Fatty Acids (FISH OIL) 1200 MG CAPS Take 1,200 mg by mouth daily.     . pantoprazole (PROTONIX) 40 MG tablet TAKE 1 TABLET BY MOUTH TWICE DAILY (Patient taking differently: Take 40 mg by mouth daily. ) 180 tablet 1  . tamsulosin (FLOMAX) 0.4 MG CAPS capsule Take 0.4 mg by mouth daily after supper.   1  . PERFLUTREN LIPID MICROSPHERE Inject 1-10 mLs into the vein as needed (for use during echocardiogram). (Patient not taking: Reported on 08/02/2019) 10 mL 0   No facility-administered medications prior to visit.    Allergies  Allergen Reactions  . Norco [Hydrocodone-Acetaminophen] Other (See Comments)    Confusion  . Oxycontin [Oxycodone] Other (See Comments)    loopy    ROS Review of Systems  Constitutional: Negative.   HENT: Negative.   Eyes: Negative.   Respiratory: Negative.   Cardiovascular: Positive for chest pain and leg swelling. Negative for palpitations.  Gastrointestinal: Negative.   Endocrine: Negative.   Genitourinary: Negative.   Skin: Negative.   Neurological: Negative.   Psychiatric/Behavioral: Negative.       Objective:    Physical Exam Constitutional:      Appearance: Normal appearance.  HENT:     Head: Normocephalic and atraumatic.  Cardiovascular:     Rate and Rhythm: Normal rate and regular rhythm.     Pulses: Normal pulses.     Heart sounds: Normal heart sounds.  Pulmonary:     Effort: Pulmonary effort is normal.     Breath sounds: Normal breath sounds.  Musculoskeletal:     Cervical back: Neck supple.  Neurological:     Mental  Status: He is alert and oriented to person, place, and time.  Psychiatric:        Mood and Affect: Mood normal.        Behavior: Behavior normal.  BP 134/78   Pulse 74   Temp (!) 97.3 F (36.3 C)   Ht 5\' 11"  (1.803 m)   Wt 191 lb (86.6 kg)   SpO2 100%   BMI 26.64 kg/m  Wt Readings from Last 3 Encounters:  08/31/19 191 lb (86.6 kg)  08/03/19 200 lb (90.7 kg)  08/02/19 203 lb (92.1 kg)     Health Maintenance Due  Topic Date Due  . FOOT EXAM  Never done  . OPHTHALMOLOGY EXAM  Never done  . TETANUS/TDAP  Never done  . PNA vac Low Risk Adult (1 of 2 - PCV13) Never done     Lab Results  Component Value Date   WBC 6.4 08/02/2019   HGB 16.3 08/02/2019   HCT 47.7 08/02/2019   MCV 88 08/02/2019   PLT 206 08/02/2019   Lab Results  Component Value Date   NA 137 08/02/2019   K 4.2 08/02/2019   CO2 19 (L) 08/02/2019   GLUCOSE 103 (H) 08/02/2019   BUN 16 08/02/2019   CREATININE 0.93 08/02/2019   BILITOT 1.2 05/25/2019   ALKPHOS 93 05/25/2019   AST 35 05/25/2019   ALT 46 (H) 05/25/2019   PROT 7.5 05/25/2019   ALBUMIN 5.0 (H) 05/25/2019   CALCIUM 9.8 08/02/2019   ANIONGAP 10 02/25/2018   Lab Results  Component Value Date   CHOL 116 05/25/2019   Lab Results  Component Value Date   HDL 44 05/25/2019   Lab Results  Component Value Date   LDLCALC 55 05/25/2019   Lab Results  Component Value Date   TRIG 90 05/25/2019   Lab Results  Component Value Date   CHOLHDL 2.6 05/25/2019   Lab Results  Component Value Date   HGBA1C 6.4 (H) 05/25/2019      Assessment & Plan:  1. Diabetes mellitus due to underlying condition with unspecified complications (HCC) Stable  - Hemoglobin A1c - POCT UA - Microalbumin gabapentin 2. Essential hypertension Stable-pt with intermittent chest pain-has nitro-could not tolerate long acting nitro-keep cardio appt for additional evaluation. zestril - TSH  3. Mixed hyperlipidemia liptor /Omega 3-daily-stable  Follow-up:  3 months   Rawson Minix Hannah Beat, MD

## 2019-09-01 LAB — TSH: TSH: 3 u[IU]/mL (ref 0.450–4.500)

## 2019-09-01 LAB — HEMOGLOBIN A1C
Est. average glucose Bld gHb Est-mCnc: 126 mg/dL
Hgb A1c MFr Bld: 6 % — ABNORMAL HIGH (ref 4.8–5.6)

## 2019-09-06 ENCOUNTER — Encounter: Payer: Self-pay | Admitting: Cardiology

## 2019-09-06 ENCOUNTER — Other Ambulatory Visit: Payer: Self-pay

## 2019-09-06 ENCOUNTER — Ambulatory Visit: Payer: Medicare PPO | Admitting: Cardiology

## 2019-09-06 VITALS — BP 146/74 | HR 76 | Ht 71.0 in | Wt 202.8 lb

## 2019-09-06 DIAGNOSIS — I480 Paroxysmal atrial fibrillation: Secondary | ICD-10-CM

## 2019-09-06 DIAGNOSIS — E782 Mixed hyperlipidemia: Secondary | ICD-10-CM | POA: Diagnosis not present

## 2019-09-06 DIAGNOSIS — I10 Essential (primary) hypertension: Secondary | ICD-10-CM | POA: Diagnosis not present

## 2019-09-06 DIAGNOSIS — M5416 Radiculopathy, lumbar region: Secondary | ICD-10-CM | POA: Diagnosis not present

## 2019-09-06 DIAGNOSIS — I2510 Atherosclerotic heart disease of native coronary artery without angina pectoris: Secondary | ICD-10-CM

## 2019-09-06 NOTE — Progress Notes (Signed)
Cardiology Office Note:    Date:  09/06/2019   ID:  [name], DOB 10-Jun-1940, MRN [id number]  PCP:  Rochel Brome, MD  Cardiologist:  Jenean Lindau, MD   Referring MD: Rochel Brome, MD    ASSESSMENT:    1. Coronary artery disease involving native coronary artery of native heart without angina pectoris   2. Essential hypertension   3. Mixed hyperlipidemia   4. Paroxysmal atrial fibrillation (HCC)    PLAN:    In order of problems listed above:  1. Coronary artery disease: Secondary prevention stressed with the patient. Importance of compliance with diet medication stressed and he vocalized understanding. Coronary angiography report was detailed to him at extensive length. He vocalized understanding and he and his wife had questions which were answered to their satisfaction. 2. Essential hypertension: Blood pressure stable 3. Mixed dyslipidemia: On statin therapy. Diet was emphasized. KPN sheet was reviewed lipids are unremarkable. 4. Patient will be seen in follow-up appointment in 6 months or earlier if the patient has any concerns. Patient had multiple questions which were answered to his and his wife satisfaction.   Medication Adjustments/Labs and Tests Ordered: Current medicines are reviewed at length with the patient today.  Concerns regarding medicines are outlined above.  No orders of the defined types were placed in this encounter.  No orders of the defined types were placed in this encounter.    No chief complaint on file.    History of Present Illness:    [name] is a 79 y.o. male. Patient has past medical history of essential hypertension and dyslipidemia. He was evaluated for anginal-like symptoms and underwent coronary angiography. The details are mentioned below. Patient denies any problems at this time. No chest pain orthopnea or PND. At the time of my evaluation, the patient is alert awake oriented and in no distress. He occasionally has chest  tightness.  Past Medical History:  Diagnosis Date  . Arthritis   . Atrial fibrillation ([place])   . Cancer ([place]) 10/2015   prostate cancer reated with radiation  . Cataract   . Colitis 11/22/2018  . 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 Depression 04/04/2019  . Diarrhea 11/21/2018  . Dyslipidemia 06/17/2016  . Dysrhythmia   . Essential hypertension 06/17/2016  . GERD (gastroesophageal reflux disease)   . Headache   . History of hiatal hernia   . History of kidney stones   . History of prostate cancer 05/20/2019  . Hypercholesteremia 04/04/2019  . Hyperlipemia   . Hypertension   . Impaired fasting blood sugar 05/20/2019  . Malignant neoplasm prostate ([place]) 05/20/2019  . Paroxysmal atrial fibrillation ([place]) 04/04/2019  . PONV (postoperative nausea and vomiting)   . 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 S/P lumbar spinal fusion 09/29/2013  . Sepsis ([place]) 11/22/2018    Past Surgical History:  Procedure Laterality Date  . anal fissures    . ANTERIOR CERVICAL DECOMP/DISCECTOMY FUSION N/A 03/04/2018   Procedure: Anterior Cervical Decompression Fusion - Cervical three-Cervical four - Cervical four-Cervical five;  Surgeon: Eustace Moore, MD;  Location: [facility];  Service: Neurosurgery;  Laterality: N/A;  . BACK SURGERY    . CARDIAC CATHETERIZATION     no PCI  . CARPAL TUNNEL RELEASE Left 03/04/2018   Procedure: Carpal Tunnel Release - left;  Surgeon: Eustace Moore, MD;  Location: [facility];  Service: Neurosurgery;  Laterality: Left;  . CHOLECYSTECTOMY    . COLONOSCOPY W/ POLYPECTOMY  08/30/2012   Colonic polyp status post polypectomy.  Pancolonic diverticulosis predominantly in the sigmoid colon. Small internal hemorrhoids. Moderate internal hemorhroids.   . ESOPHAGOGASTRODUODENOSCOPY  08/04/2016   Schatzkis ring status post esophageal dilatation. Small hiatal hernia. Mild gastritis.    Marland Kitchen EYE SURGERY     cataract removal bilateral eyes  . FOOT SURGERY     left heel  . HERNIA REPAIR    . LEFT HEART CATH AND CORONARY ANGIOGRAPHY N/A 08/03/2019   Procedure: LEFT HEART CATH AND CORONARY ANGIOGRAPHY;  Surgeon: Jettie Booze, MD;  Location: [facility];  Service: Cardiovascular;  Laterality: N/A;  . SHOULDER SURGERY     bilateral    Current Medications: Current Meds  Medication Sig  . ASPIRIN LOW DOSE 81 MG EC tablet TAKE 1 TABLET BY MOUTH EVERY DAY (Patient taking differently: Take 81 mg by mouth daily. )  . atorvastatin (LIPITOR) 40 MG tablet Take 40 mg by mouth daily.  . cholecalciferol (VITAMIN D) 1000 UNITS tablet Take 1,000 Units by mouth daily.  Marland Kitchen gabapentin (NEURONTIN) 300 MG capsule Take 300 mg by mouth 3 (three) times daily.  . Glycerin-Polysorbate 80 (REFRESH DRY EYE THERAPY) 1-1 % SOLN Place 1 drop into both eyes 2 (two) times daily as needed (for dry eyes).   Marland Kitchen lisinopril (ZESTRIL) 20 MG tablet Take 1 tablet (20 mg total) by mouth daily. (Patient taking differently: Take 10 mg by mouth daily. )  . meloxicam (MOBIC) 15 MG tablet Take 15 mg by mouth daily.  . metFORMIN (GLUCOPHAGE) 500 MG tablet TAKE 1 TABLET BY MOUTH EVERY DAY WITH BREAKFAST  . nitroGLYCERIN (NITROSTAT) 0.4 MG SL tablet Place 1 tablet (0.4 mg total) under the tongue every 5 (five) minutes x 3 doses as needed for chest pain.  . Omega-3 Fatty Acids (FISH OIL) 1200 MG CAPS Take 1,200 mg by mouth daily.   . pantoprazole (PROTONIX) 40 MG tablet TAKE 1 TABLET BY MOUTH TWICE DAILY (Patient taking differently: Take 40 mg by mouth daily. )  . tamsulosin (FLOMAX) 0.4 MG CAPS capsule Take 0.4 mg by mouth daily after supper.   . [DISCONTINUED] gabapentin (NEURONTIN) 300 MG capsule Take 1 capsule (300 mg total) by mouth 2 (two) times daily.     Allergies:   Oxycontin [oxycodone]   Social History   Socioeconomic History  . Marital status: Widowed    Spouse name: Not on file  . Number of  children: 2  . Years of education: Not on file  . Highest education level: Not on file  Occupational History  . Not on file  Tobacco Use  . Smoking status: Former Smoker    Packs/day: 0.50    Years: 35.00    Pack years: 17.50    Types: Cigarettes    Quit date: 02/10/1993    Years since quitting: 26.5  . Smokeless tobacco: Former Systems developer    Types: [place] date: 02/10/2006  Vaping Use  . Vaping Use: Never used  Substance and Sexual Activity  . Alcohol use: Yes    Alcohol/week: 1.0 standard drink    Types: 1 Cans of beer per week    Comment: monthly  . Drug use: No  . Sexual activity: Yes    Partners: Female  Other Topics Concern  . Not on file  Social History Narrative  . Not on file   Social Determinants of Health  Financial Resource Strain:   . Difficulty of Paying Living Expenses:   Food Insecurity:   . Worried About Charity fundraiser in the Last Year:   . Arboriculturist in the Last Year:   Transportation Needs:   . Film/video editor (Medical):   Marland Kitchen Lack of Transportation (Non-Medical):   Physical Activity:   . Days of Exercise per Week:   . Minutes of Exercise per Session:   Stress:   . Feeling of Stress :   Social Connections:   . Frequency of Communication with Friends and Family:   . Frequency of Social Gatherings with Friends and Family:   . Attends Religious Services:   . Active Member of Clubs or Organizations:   . Attends Archivist Meetings:   Marland Kitchen Marital Status:      Family History: The patient's family history is negative for Colon cancer, Esophageal cancer, Rectal cancer, and Stomach cancer.  ROS:   Please see the history of present illness.    All other systems reviewed and are negative.  EKGs/Labs/Other Studies Reviewed:    The following studies were reviewed today: Coronary artery disease  Jettie Booze, MD (Primary)    Procedures  LEFT HEART CATH AND CORONARY ANGIOGRAPHY  Conclusion    Prox Cx lesion is  30% stenosed.  Prox LAD to Mid LAD lesion is 40% stenosed.  Mid LAD lesion is 25% stenosed.  The left ventricular systolic function is normal.  LV end diastolic pressure is normal.  The left ventricular ejection fraction is 55-65% by visual estimate.  There is no aortic valve stenosis.   Nonobstructive CAD.  Continue preventive therapy.   Results conveyed to daughter.     Recent Labs: 05/25/2019: ALT 46 08/02/2019: BUN 16; Creatinine, Ser 0.93; Hemoglobin 16.3; Platelets 206; Potassium 4.2; Sodium 137 08/31/2019: TSH 3.000  Recent Lipid Panel    Component Value Date/Time   CHOL 116 05/25/2019 1143   TRIG 90 05/25/2019 1143   HDL 44 05/25/2019 1143   CHOLHDL 2.6 05/25/2019 1143   CHOLHDL 2.6 01/24/2017 0521   VLDL 15 01/24/2017 0521   LDLCALC 55 05/25/2019 1143    Physical Exam:    VS:  BP (!) 146/74   Pulse 76   Ht 5\' 11"  (1.803 m)   Wt 202 lb 12.8 oz (92 kg)   SpO2 97%   BMI 28.28 kg/m     Wt Readings from Last 3 Encounters:  09/06/19 202 lb 12.8 oz (92 kg)  08/31/19 191 lb (86.6 kg)  08/03/19 200 lb (90.7 kg)     GEN: Patient is in no acute distress HEENT: Normal NECK: No JVD; No carotid bruits LYMPHATICS: No lymphadenopathy CARDIAC: Hear sounds regular, 2/6 systolic murmur at the apex. RESPIRATORY:  Clear to auscultation without rales, wheezing or rhonchi  ABDOMEN: Soft, non-tender, non-distended MUSCULOSKELETAL:  No edema; No deformity  SKIN: Warm and dry NEUROLOGIC:  Alert and oriented x 3 PSYCHIATRIC:  Normal affect   Signed, Jenean Lindau, MD  09/06/2019 9:35 AM    [place]

## 2019-09-06 NOTE — Patient Instructions (Signed)
Medication Instructions:  Your physician recommends that you continue on your current medications as directed. Please refer to the Current Medication list given to you today.  *If you need a refill on your cardiac medications before your next appointment, please call your pharmacy*   Lab Work: None ordered  If you have labs (blood work) drawn today and your tests are completely normal, you will receive your results only by: Marland Kitchen MyChart Message (if you have MyChart) OR . A paper copy in the mail If you have any lab test that is abnormal or we need to change your treatment, we will call you to review the results.   Testing/Procedures: None ordered    Follow-Up: At [facility], you and your health needs are our priority.  As part of our continuing mission to provide you with exceptional heart care, we have created designated Provider Care Teams.  These Care Teams include your primary Cardiologist (physician) and Advanced Practice Providers (APPs -  Physician Assistants and Nurse Practitioners) who all work together to provide you with the care you need, when you need it.  We recommend signing up for the patient portal called "MyChart".  Sign up information is provided on this After Visit Summary.  MyChart is used to connect with patients for Virtual Visits (Telemedicine).  Patients are able to view lab/test results, encounter notes, upcoming appointments, etc.  Non-urgent messages can be sent to your provider as well.   To learn more about what you can do with MyChart, go to [email or web address].    Your next appointment:   4 month(s)  The format for your next appointment:   In Person  Provider:   Jyl Heinz, MD   Other Instructions None

## 2019-09-15 ENCOUNTER — Other Ambulatory Visit: Payer: Self-pay | Admitting: Physician Assistant

## 2019-10-07 DIAGNOSIS — Z6827 Body mass index (BMI) 27.0-27.9, adult: Secondary | ICD-10-CM | POA: Diagnosis not present

## 2019-10-07 DIAGNOSIS — I10 Essential (primary) hypertension: Secondary | ICD-10-CM | POA: Diagnosis not present

## 2019-10-07 DIAGNOSIS — M5416 Radiculopathy, lumbar region: Secondary | ICD-10-CM | POA: Diagnosis not present

## 2019-10-19 ENCOUNTER — Encounter: Payer: Self-pay | Admitting: Gastroenterology

## 2019-10-24 DIAGNOSIS — Z961 Presence of intraocular lens: Secondary | ICD-10-CM | POA: Diagnosis not present

## 2019-10-24 DIAGNOSIS — H40023 Open angle with borderline findings, high risk, bilateral: Secondary | ICD-10-CM | POA: Diagnosis not present

## 2019-10-24 DIAGNOSIS — H35363 Drusen (degenerative) of macula, bilateral: Secondary | ICD-10-CM | POA: Diagnosis not present

## 2019-10-24 DIAGNOSIS — H524 Presbyopia: Secondary | ICD-10-CM | POA: Diagnosis not present

## 2019-10-24 DIAGNOSIS — H43392 Other vitreous opacities, left eye: Secondary | ICD-10-CM | POA: Diagnosis not present

## 2019-10-26 ENCOUNTER — Other Ambulatory Visit: Payer: Self-pay | Admitting: Family Medicine

## 2019-11-01 ENCOUNTER — Ambulatory Visit (AMBULATORY_SURGERY_CENTER): Payer: Self-pay

## 2019-11-01 ENCOUNTER — Other Ambulatory Visit: Payer: Self-pay

## 2019-11-01 VITALS — Ht 71.0 in | Wt 201.0 lb

## 2019-11-01 DIAGNOSIS — Z86010 Personal history of colonic polyps: Secondary | ICD-10-CM

## 2019-11-01 MED ORDER — NA SULFATE-K SULFATE-MG SULF 17.5-3.13-1.6 GM/177ML PO SOLN: 1.0000 | Freq: Once | ORAL | 0 refills | Status: AC

## 2019-11-01 NOTE — Progress Notes (Signed)
No egg or soy allergy known to patient  No issues with past sedation with any surgeries or procedures----patient does have documented hx of PONV- No intubation problems in the past  No FH of Malignant Hyperthermia No diet pills per patient No home 02 use per patient  No blood thinners per patient  Pt denies issues with constipation  No A flutter HX OF AFIB- no BT  EMMI video via MyChart  COVID 19 guidelines implemented in PV today with Pt and RN  Coupon given to pt in PV today , Code to Pharmacy  COVID vaccines completed on 04/2019 per pt;  Due to the COVID-19 pandemic we are asking patients to follow these guidelines. Please only bring one care partner. Please be aware that your care partner may wait in the car in the parking lot or if they feel like they will be too hot to wait in the car, they may wait in the lobby on the 4th floor. All care partners are required to wear a mask the entire time (we do not have any that we can provide them), they need to practice social distancing, and we will do a Covid check for all patient's and care partners when you arrive. Also we will check their temperature and your temperature. If the care partner waits in their car they need to stay in the parking lot the entire time and we will call them on their cell phone when the patient is ready for discharge so they can bring the car to the front of the building. Also all patient's will need to wear a mask into building.

## 2019-11-10 DIAGNOSIS — Z6827 Body mass index (BMI) 27.0-27.9, adult: Secondary | ICD-10-CM | POA: Diagnosis not present

## 2019-11-10 DIAGNOSIS — I10 Essential (primary) hypertension: Secondary | ICD-10-CM | POA: Diagnosis not present

## 2019-11-10 DIAGNOSIS — M5416 Radiculopathy, lumbar region: Secondary | ICD-10-CM | POA: Diagnosis not present

## 2019-11-11 ENCOUNTER — Other Ambulatory Visit: Payer: Self-pay | Admitting: Family Medicine

## 2019-11-15 ENCOUNTER — Other Ambulatory Visit: Payer: Self-pay | Admitting: Neurological Surgery

## 2019-11-15 ENCOUNTER — Telehealth: Payer: Self-pay | Admitting: Nurse Practitioner

## 2019-11-15 DIAGNOSIS — M5416 Radiculopathy, lumbar region: Secondary | ICD-10-CM

## 2019-11-15 NOTE — Telephone Encounter (Signed)
Phone call to patient to verify medication list and allergies for myelogram procedure. Pt aware he will not need to hold any medications for this procedure. Pre and post procedure instructions reviewed with pt. Pt verbalized understanding.  

## 2019-11-17 ENCOUNTER — Encounter: Payer: Self-pay | Admitting: Certified Registered Nurse Anesthetist

## 2019-11-18 ENCOUNTER — Other Ambulatory Visit: Payer: Self-pay

## 2019-11-18 ENCOUNTER — Encounter: Payer: Self-pay | Admitting: Gastroenterology

## 2019-11-18 ENCOUNTER — Ambulatory Visit (AMBULATORY_SURGERY_CENTER): Payer: Medicare PPO | Admitting: Gastroenterology

## 2019-11-18 ENCOUNTER — Encounter: Payer: Medicare PPO | Admitting: Gastroenterology

## 2019-11-18 VITALS — BP 110/63 | HR 67 | Temp 97.1°F | Resp 11 | Ht 71.0 in | Wt 201.0 lb

## 2019-11-18 DIAGNOSIS — D122 Benign neoplasm of ascending colon: Secondary | ICD-10-CM

## 2019-11-18 DIAGNOSIS — Z86010 Personal history of colonic polyps: Secondary | ICD-10-CM | POA: Diagnosis not present

## 2019-11-18 DIAGNOSIS — E119 Type 2 diabetes mellitus without complications: Secondary | ICD-10-CM | POA: Diagnosis not present

## 2019-11-18 DIAGNOSIS — I4891 Unspecified atrial fibrillation: Secondary | ICD-10-CM | POA: Diagnosis not present

## 2019-11-18 DIAGNOSIS — I10 Essential (primary) hypertension: Secondary | ICD-10-CM | POA: Diagnosis not present

## 2019-11-18 DIAGNOSIS — I2510 Atherosclerotic heart disease of native coronary artery without angina pectoris: Secondary | ICD-10-CM | POA: Diagnosis not present

## 2019-11-18 MED ORDER — SODIUM CHLORIDE 0.9 % IV SOLN: 500.0000 mL | Freq: Once | INTRAVENOUS | Status: DC

## 2019-11-18 NOTE — Patient Instructions (Signed)
3 polyp removed.  Diverticulosis and hemorrhoids.  Resume previous medications.  Await pathology for final recommendations.  Handouts on findings given to patient.    YOU HAD AN ENDOSCOPIC PROCEDURE TODAY AT [facility]:   Refer to the procedure report that was given to you for any specific questions about what was found during the examination.  If the procedure report does not answer your questions, please call your gastroenterologist to clarify.  If you requested that your care partner not be given the details of your procedure findings, then the procedure report has been included in a sealed envelope for you to review at your convenience later.  YOU SHOULD EXPECT: Some feelings of bloating in the abdomen. Passage of more gas than usual.  Walking can help get rid of the air that was put into your GI tract during the procedure and reduce the bloating. If you had a lower endoscopy (such as a colonoscopy or flexible sigmoidoscopy) you may notice spotting of blood in your stool or on the toilet paper. If you underwent a bowel prep for your procedure, you may not have a normal bowel movement for a few days.  Please Note:  You might notice some irritation and congestion in your nose or some drainage.  This is from the oxygen used during your procedure.  There is no need for concern and it should clear up in a day or so.  SYMPTOMS TO REPORT IMMEDIATELY:   Following lower endoscopy (colonoscopy or flexible sigmoidoscopy):  Excessive amounts of blood in the stool  Significant tenderness or worsening of abdominal pains  Swelling of the abdomen that is new, acute  Fever of 100F or higher   For urgent or emergent issues, a gastroenterologist can be reached at any hour by calling [phone]. Do not use MyChart messaging for urgent concerns.    DIET:  We do recommend a small meal at first, but then you may proceed to your regular diet.  Drink plenty of fluids but you should avoid  alcoholic beverages for 24 hours.  ACTIVITY:  You should plan to take it easy for the rest of today and you should NOT DRIVE or use heavy machinery until tomorrow (because of the sedation medicines used during the test).    FOLLOW UP: Our staff will call the number listed on your records 48-72 hours following your procedure to check on you and address any questions or concerns that you may have regarding the information given to you following your procedure. If we do not reach you, we will leave a message.  We will attempt to reach you two times.  During this call, we will ask if you have developed any symptoms of COVID 19. If you develop any symptoms (ie: fever, flu-like symptoms, shortness of breath, cough etc.) before then, please call [phone].  If you test positive for Covid 19 in the 2 weeks post procedure, please call and report this information to [place].    If any biopsies were taken you will be contacted by phone or by letter within the next 1-3 weeks.  Please call us at [phone] if you have not heard about the biopsies in 3 weeks.    SIGNATURES/CONFIDENTIALITY: You and/or your care partner have signed paperwork which will be entered into your electronic medical record.  These signatures attest to the fact that that the information above on your After Visit Summary has been reviewed and is understood.  Full responsibility of the confidentiality of  this discharge information lies with you and/or your care-partner.

## 2019-11-18 NOTE — Progress Notes (Signed)
Called to room to assist during endoscopic procedure.  Patient ID and intended procedure confirmed with present staff. Received instructions for my participation in the procedure from the performing physician.  

## 2019-11-18 NOTE — Op Note (Signed)
[facility] Patient Name: [name] Procedure Date: 11/18/2019 9:47 AM MRN: [id number] Endoscopist: Jackquline Denmark , MD Age: 79 Referring MD:  Date of Birth: 07-09-1940 Gender: Male Account #: [id number] Procedure:                Colonoscopy Indications:              High risk colon cancer surveillance: Personal                            history of colonic polyps s/p EMR Feb 2021. Repeat                            colonoscopy is performed to rule out any                            residual/recurrent polyps. Medicines:                Monitored Anesthesia Care Procedure:                Pre-Anesthesia Assessment:                           - Prior to the procedure, a History and Physical                            was performed, and patient medications and                            allergies were reviewed. The patient's tolerance of                            previous anesthesia was also reviewed. The risks                            and benefits of the procedure and the sedation                            options and risks were discussed with the patient.                            All questions were answered, and informed consent                            was obtained. Prior Anticoagulants: The patient has                            taken no previous anticoagulant or antiplatelet                            agents. ASA Grade Assessment: III - A patient with                            severe systemic disease. After reviewing the risks  and benefits, the patient was deemed in                            satisfactory condition to undergo the procedure.                           After obtaining informed consent, the colonoscope                            was passed under direct vision. Throughout the                            procedure, the patient's blood pressure, pulse, and                            oxygen saturations were monitored continuously.  The                            Colonoscope was introduced through the anus and                            advanced to the the cecum, identified by                            appendiceal orifice and ileocecal valve. The                            colonoscopy was performed without difficulty. The                            patient tolerated the procedure well. The quality                            of the bowel preparation was good. The ileocecal                            valve, appendiceal orifice, and rectum were                            photographed. Scope In: 9:52:02 AM Scope Out: 10:07:37 AM Scope Withdrawal Time: 0 hours 12 minutes 29 seconds  Total Procedure Duration: 0 hours 15 minutes 35 seconds  Findings:                 The area of the ascending colon and transverse                            colon was carefully examined using frontal views                            and retroflexion. There was no recurrence/residual                            polyps.  Three very small sessile polyps were found in the                            proximal transverse colon and mid ascending colon.                            The polyps were 2 to 4 mm in size. These polyps                            were removed with a cold snare.                           Multiple medium-mouthed diverticula were found in                            the sigmoid colon and descending colon.                           Non-bleeding internal hemorrhoids were found during                            retroflexion. The hemorrhoids were moderate.                           The exam was otherwise without abnormality on                            direct and retroflexion views. Complications:            No immediate complications. Estimated Blood Loss:     Estimated blood loss: none. Impression:               - Three 2 to 4 mm polyps in the proximal transverse                            colon and in the  mid ascending colon, removed with                            a cold snare.                           - Moderate left colonic diverticulosis.                           - Non-bleeding internal hemorrhoids.                           - The examination was otherwise normal on direct                            and retroflexion views. Recommendation:           - Patient has a contact number available for                            emergencies. The signs  and symptoms of potential                            delayed complications were discussed with the                            patient. Return to normal activities tomorrow.                            Written discharge instructions were provided to the                            patient.                           - Resume previous diet.                           - Continue present medications.                           - Await pathology results.                           - Repeat colonoscopy is not recommended for                            screening purposes.                           - Return to GI clinic PRN.                           - The findings and recommendations were discussed                            with the patient's family. Jackquline Denmark, MD 11/18/2019 10:15:19 AM This report has been signed electronically.

## 2019-11-18 NOTE — Progress Notes (Signed)
Pt's states no medical or surgical changes since previsit or office visit. 

## 2019-11-18 NOTE — Progress Notes (Signed)
Report given to PACU, vss 

## 2019-11-21 ENCOUNTER — Ambulatory Visit
Admission: RE | Admit: 2019-11-21 | Discharge: 2019-11-21 | Disposition: A | Discharge status: Home / Self Care | Payer: Medicare PPO | Source: Ambulatory Visit | Attending: Neurological Surgery | Admitting: Neurological Surgery

## 2019-11-21 ENCOUNTER — Other Ambulatory Visit: Payer: Self-pay

## 2019-11-21 DIAGNOSIS — M5416 Radiculopathy, lumbar region: Secondary | ICD-10-CM

## 2019-11-21 DIAGNOSIS — M4326 Fusion of spine, lumbar region: Secondary | ICD-10-CM | POA: Diagnosis not present

## 2019-11-21 DIAGNOSIS — M48061 Spinal stenosis, lumbar region without neurogenic claudication: Secondary | ICD-10-CM | POA: Diagnosis not present

## 2019-11-21 MED ORDER — IOPAMIDOL (ISOVUE-M 200) INJECTION 41%
18.0000 mL | Freq: Once | INTRAMUSCULAR | Status: AC
  Administered 2019-11-21: 18 mL via INTRATHECAL

## 2019-11-21 MED ORDER — DIAZEPAM 5 MG PO TABS
5.0000 mg | ORAL_TABLET | Freq: Once | ORAL | Status: AC
  Administered 2019-11-21: 5 mg via ORAL

## 2019-11-21 NOTE — Discharge Instructions (Signed)

## 2019-11-22 ENCOUNTER — Telehealth: Payer: Self-pay | Admitting: *Deleted

## 2019-11-22 ENCOUNTER — Telehealth: Payer: Self-pay

## 2019-11-22 NOTE — Telephone Encounter (Signed)
Left message on follow up call. 

## 2019-11-22 NOTE — Telephone Encounter (Signed)
  Follow up Call-  Call back number 11/18/2019 04/05/2019  Post procedure Call Back phone  # [phone]  Permission to leave phone message Yes Yes  Some recent data might be hidden     Patient questions:  Do you have a fever, pain , or abdominal swelling? No. Pain Score  0 *  Have you tolerated food without any problems? Yes.    Have you been able to return to your normal activities? Yes.    Do you have any questions about your discharge instructions: Diet   No. Medications  No. Follow up visit  No.  Do you have questions or concerns about your Care? No.  Actions: * If pain score is 4 or above: No action needed, pain <4.  1. Have you developed a fever since your procedure? no  2.   Have you had an respiratory symptoms (SOB or cough) since your procedure? no  3.   Have you tested positive for COVID 19 since your procedure no  4.   Have you had any family members/close contacts diagnosed with the COVID 19 since your procedure?  no   If yes to any of these questions please route to Joylene John, RN and Joella Prince, RN

## 2019-11-24 DIAGNOSIS — M5416 Radiculopathy, lumbar region: Secondary | ICD-10-CM | POA: Diagnosis not present

## 2019-11-24 DIAGNOSIS — Z6828 Body mass index (BMI) 28.0-28.9, adult: Secondary | ICD-10-CM | POA: Diagnosis not present

## 2019-11-24 DIAGNOSIS — I10 Essential (primary) hypertension: Secondary | ICD-10-CM | POA: Diagnosis not present

## 2019-11-25 ENCOUNTER — Other Ambulatory Visit: Payer: Self-pay | Admitting: Neurological Surgery

## 2019-11-27 ENCOUNTER — Encounter: Payer: Self-pay | Admitting: Gastroenterology

## 2019-12-01 DIAGNOSIS — M4800 Spinal stenosis, site unspecified: Secondary | ICD-10-CM | POA: Diagnosis not present

## 2019-12-02 ENCOUNTER — Ambulatory Visit: Payer: Medicare PPO | Admitting: Family Medicine

## 2019-12-02 ENCOUNTER — Other Ambulatory Visit: Payer: Self-pay

## 2019-12-02 ENCOUNTER — Encounter: Payer: Self-pay | Admitting: Family Medicine

## 2019-12-02 VITALS — BP 116/72 | HR 72 | Temp 97.2°F | Resp 16 | Ht 71.0 in | Wt 204.0 lb

## 2019-12-02 DIAGNOSIS — E088 Diabetes mellitus due to underlying condition with unspecified complications: Secondary | ICD-10-CM | POA: Diagnosis not present

## 2019-12-02 DIAGNOSIS — I2510 Atherosclerotic heart disease of native coronary artery without angina pectoris: Secondary | ICD-10-CM | POA: Diagnosis not present

## 2019-12-02 DIAGNOSIS — E782 Mixed hyperlipidemia: Secondary | ICD-10-CM | POA: Diagnosis not present

## 2019-12-02 DIAGNOSIS — I119 Hypertensive heart disease without heart failure: Secondary | ICD-10-CM

## 2019-12-02 DIAGNOSIS — E1169 Type 2 diabetes mellitus with other specified complication: Secondary | ICD-10-CM | POA: Diagnosis not present

## 2019-12-02 DIAGNOSIS — D692 Other nonthrombocytopenic purpura: Secondary | ICD-10-CM

## 2019-12-02 DIAGNOSIS — I10 Essential (primary) hypertension: Secondary | ICD-10-CM | POA: Diagnosis not present

## 2019-12-02 DIAGNOSIS — Z23 Encounter for immunization: Secondary | ICD-10-CM | POA: Diagnosis not present

## 2019-12-02 LAB — POCT URINALYSIS DIP (CLINITEK)
Bilirubin, UA: NEGATIVE
Blood, UA: NEGATIVE
Glucose, UA: NEGATIVE mg/dL
Ketones, POC UA: NEGATIVE mg/dL
Leukocytes, UA: NEGATIVE
Nitrite, UA: NEGATIVE
POC PROTEIN,UA: NEGATIVE
Spec Grav, UA: 1.015 (ref 1.010–1.025)
Urobilinogen, UA: NEGATIVE E.U./dL — AB
pH, UA: 5 (ref 5.0–8.0)

## 2019-12-02 NOTE — Progress Notes (Signed)
Subjective:  Patient ID: [name], male    DOB: 1940/12/30  Age: 79 y.o. MRN: [id number]  Chief Complaint  Patient presents with  . Diabetes  . Hyperlipidemia   HPI: Hypertensive heart disease without heart failure was first diagnosed several years ago.  He is not using any nonpharmacologic treatment modalities.  His current cardiac medication regimen includes an ACE inhibitor ( lisinopril ), aspirin, and nitroglycerin on hand.  He is tolerating the medication well without side effects.  Compliance with treatment has been good; he takes his medication as directed, maintains his diet and exercise regimen, and follows up as directed.      Pt presents with hyperlipidemia.  Current treatment includes Lipitor and fish oil.  Compliance with treatment has been good; he takes his medication as directed, follows up as directed, and maintains his exercise regimen.  He denies experiencing any hypercholesterolemia related symptoms.      [name] presents with a diagnosis of Diabetes type 2.  The course has been stable and nonprogressive.  eats healthy. On metformin 500 mg once daily.  Eating healthy.     GERD: Controlled on pantoprazole.    Current Outpatient Medications on File Prior to Visit  Medication Sig Dispense Refill  . aspirin EC 81 MG tablet Take 1 tablet (81 mg total) by mouth daily. 30 tablet 11  . atorvastatin (LIPITOR) 40 MG tablet Take 40 mg by mouth daily.    . cholecalciferol (VITAMIN D) 1000 UNITS tablet Take 1,000 Units by mouth daily.    Marland Kitchen gabapentin (NEURONTIN) 300 MG capsule Take 300 mg by mouth 2 (two) times daily.     . Glycerin-Polysorbate 80 (REFRESH DRY EYE THERAPY) 1-1 % SOLN Place 1 drop into both eyes 2 (two) times daily as needed (for dry eyes).     Marland Kitchen HYDROcodone-acetaminophen (NORCO) 10-325 MG tablet Take 1 tablet by mouth daily as needed for severe pain.    Marland Kitchen lisinopril (ZESTRIL) 20 MG tablet Take 1 tablet (20 mg total) by mouth daily. 90 tablet 3  . meloxicam  (MOBIC) 15 MG tablet Take 15 mg by mouth daily.    . metFORMIN (GLUCOPHAGE) 500 MG tablet TAKE 1 TABLET BY MOUTH EVERY DAY WITH BREAKFAST (Patient taking differently: Take 500 mg by mouth daily with breakfast. ) 90 tablet 0  . methocarbamol (ROBAXIN) 500 MG tablet Take 1 tablet (500 mg total) by mouth 4 (four) times daily. 45 tablet 0  . Multiple Vitamins-Minerals (PRESERVISION AREDS 2 PO) Take 1 tablet by mouth in the morning and at bedtime.    . nitroGLYCERIN (NITROSTAT) 0.4 MG SL tablet Place 1 tablet (0.4 mg total) under the tongue every 5 (five) minutes x 3 doses as needed for chest pain. 25 tablet 12  . Omega-3 Fatty Acids (FISH OIL) 1200 MG CAPS Take 1,200 mg by mouth daily.     . pantoprazole (PROTONIX) 40 MG tablet Take 1 tablet (40 mg total) by mouth daily. 90 tablet 1  . tamsulosin (FLOMAX) 0.4 MG CAPS capsule Take 0.4 mg by mouth daily after supper.   1   Current Facility-Administered Medications on File Prior to Visit  Medication Dose Route Frequency Provider Last Rate Last Admin  . 0.9 %  sodium chloride infusion  500 mL Intravenous Once Jackquline Denmark, MD       Past Medical History:  Diagnosis Date  . Arthritis    on meds  . Atrial fibrillation ([place])   . Cancer [facility]) 10/2015   prostate  cancer treated with radiation  . Cataract    sx-bilaterally  . Colitis 11/22/2018  . 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 Depression 04/04/2019  . Diabetes mellitus without complication ([place])    Type II  . Diarrhea 11/21/2018  . Dyslipidemia 06/17/2016  . Dysrhythmia   . Essential hypertension 06/17/2016  . GERD (gastroesophageal reflux disease)    on meds  . Headache   . History of hiatal hernia   . History of kidney stones   . History of prostate cancer 05/20/2019  . Hypercholesteremia 04/04/2019  . Hyperlipemia   . Hypertension   . Impaired fasting blood sugar 05/20/2019  .  Malignant neoplasm prostate ([place]) 05/20/2019  . Paroxysmal atrial fibrillation ([place]) 04/04/2019  . PONV (postoperative nausea and vomiting)   . 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 S/P lumbar spinal fusion 09/29/2013  . Sepsis ([place]) 11/22/2018   Past Surgical History:  Procedure Laterality Date  . anal fissures    . ANTERIOR CERVICAL DECOMP/DISCECTOMY FUSION N/A 03/04/2018   Procedure: Anterior Cervical Decompression Fusion - Cervical three-Cervical four - Cervical four-Cervical five;  Surgeon: Eustace Moore, MD;  Location: [facility];  Service: Neurosurgery;  Laterality: N/A;  . BACK SURGERY    . CARDIAC CATHETERIZATION     no PCI  . CARPAL TUNNEL RELEASE Left 03/04/2018   Procedure: Carpal Tunnel Release - left;  Surgeon: Eustace Moore, MD;  Location: [facility];  Service: Neurosurgery;  Laterality: Left;  . CHOLECYSTECTOMY    . COLONOSCOPY W/ POLYPECTOMY  08/30/2012   Colonic polyp status post polypectomy.  Pancolonic diverticulosis predominantly in the sigmoid colon. Small internal hemorrhoids. Moderate internal hemorhroids.   . ESOPHAGOGASTRODUODENOSCOPY  08/04/2016   Schatzkis ring status post esophageal dilatation. Small hiatal hernia. Mild gastritis.   Marland Kitchen EYE SURGERY     cataract removal bilateral eyes  . FOOT SURGERY     left heel  . HERNIA REPAIR    . LAMINECTOMY WITH POSTERIOR LATERAL ARTHRODESIS LEVEL 2 N/A 12/09/2019   Procedure: Laminectomy and Foraminotomy - Lumbar one-two, Lumbar two-three, posterolateral instrumented fusion Lumbar one-three, removal of instrumentation Lumbar three-five;  Surgeon: Eustace Moore, MD;  Location: [facility];  Service: Neurosurgery;  Laterality: N/A;  . LEFT HEART CATH AND CORONARY ANGIOGRAPHY N/A 08/03/2019   Procedure: LEFT HEART CATH AND CORONARY ANGIOGRAPHY;  Surgeon: Jettie Booze, MD;  Location: [facility];  Service: Cardiovascular;  Laterality: N/A;  . SHOULDER SURGERY     bilateral  . WISDOM TOOTH EXTRACTION      Family History   Problem Relation Age of Onset  . Colon cancer Neg Hx   . Esophageal cancer Neg Hx   . Rectal cancer Neg Hx   . Stomach cancer Neg Hx   . Colon polyps Neg Hx    Social History   Socioeconomic History  . Marital status: Widowed    Spouse name: Not on file  . Number of children: 2  . Years of education: Not on file  . Highest education level: Not on file  Occupational History  . Not on file  Tobacco Use  . Smoking status: Former Smoker    Packs/day: 0.50    Years: 35.00    Pack years: 17.50    Types: Cigarettes    Quit date: 02/10/1993    Years since quitting: 26.8  . Smokeless tobacco: Former Systems developer  Types: Sarina Ser    Quit date: 02/10/2006  Vaping Use  . Vaping Use: Never used  Substance and Sexual Activity  . Alcohol use: Yes    Alcohol/week: 1.0 standard drink    Types: 1 Cans of beer per week    Comment: occ  . Drug use: No  . Sexual activity: Yes    Partners: Female  Other Topics Concern  . Not on file  Social History Narrative  . Not on file   Social Determinants of Health   Financial Resource Strain:   . Difficulty of Paying Living Expenses: Not on file  Food Insecurity:   . Worried About Charity fundraiser in the Last Year: Not on file  . Ran Out of Food in the Last Year: Not on file  Transportation Needs:   . Lack of Transportation (Medical): Not on file  . Lack of Transportation (Non-Medical): Not on file  Physical Activity:   . Days of Exercise per Week: Not on file  . Minutes of Exercise per Session: Not on file  Stress:   . Feeling of Stress : Not on file  Social Connections:   . Frequency of Communication with Friends and Family: Not on file  . Frequency of Social Gatherings with Friends and Family: Not on file  . Attends Religious Services: Not on file  . Active Member of Clubs or Organizations: Not on file  . Attends Archivist Meetings: Not on file  . Marital Status: Not on file    Review of Systems  Constitutional: Positive for  fatigue. Negative for chills, fever and unexpected weight change.  HENT: Negative for congestion, ear pain and sore throat.   Eyes: Negative for visual disturbance.  Respiratory: Positive for shortness of breath. Negative for cough.   Cardiovascular: Negative for chest pain, palpitations and leg swelling.  Gastrointestinal: Negative for abdominal pain, constipation, diarrhea, nausea and vomiting.  Endocrine: Negative for polydipsia, polyphagia and polyuria.  Genitourinary: Negative for dysuria, frequency and hematuria.  Musculoskeletal: Positive for arthralgias, back pain and myalgias.  Neurological: Negative for dizziness and headaches.  Psychiatric/Behavioral: Negative for dysphoric mood. The patient is not nervous/anxious.    Objective:  BP 116/72   Pulse 72   Temp (!) 97.2 F (36.2 C)   Resp 16   Ht 5\' 11"  (1.803 m)   Wt 204 lb (92.5 kg)   BMI 28.45 kg/m   BP/Weight 12/14/2019 12/12/2019 16/11/9602  Systolic BP 540 981 -  Diastolic BP 82 75 -  Wt. (Lbs) 205.03 - 199.08  BMI 31.17 - 27.77    Physical Exam Vitals reviewed.  Constitutional:      Appearance: Normal appearance.  Neck:     Vascular: No carotid bruit.  Cardiovascular:     Rate and Rhythm: Normal rate and regular rhythm.     Pulses: Normal pulses.     Heart sounds: Normal heart sounds.  Pulmonary:     Effort: Pulmonary effort is normal.     Breath sounds: Normal breath sounds. No wheezing, rhonchi or rales.  Abdominal:     General: Bowel sounds are normal.     Palpations: Abdomen is soft.     Tenderness: There is no abdominal tenderness.  Neurological:     Mental Status: He is alert.  Psychiatric:        Mood and Affect: Mood normal.        Behavior: Behavior normal.     Diabetic Foot Exam - Simple   Simple  Foot Form Diabetic Foot exam was performed with the following findings: Yes 12/02/2019 10:00 AM  Visual Inspection No deformities, no ulcerations, no other skin breakdown bilaterally:  Yes Sensation Testing Intact to touch and monofilament testing bilaterally: Yes Pulse Check Posterior Tibialis and Dorsalis pulse intact bilaterally: Yes Comments      Lab Results  Component Value Date   WBC 9.1 12/14/2019   HGB 13.4 12/14/2019   HCT 39.3 12/14/2019   PLT 176 12/14/2019   GLUCOSE 108 (H) 12/14/2019   CHOL 106 12/02/2019   TRIG 100 12/02/2019   HDL 43 12/02/2019   LDLCALC 44 12/02/2019   ALT 17 12/14/2019   AST 19 12/14/2019   NA 137 12/14/2019   K 4.1 12/14/2019   CL 102 12/14/2019   CREATININE 0.77 12/14/2019   BUN 20 12/14/2019   CO2 21 (L) 12/14/2019   TSH 3.000 08/31/2019   INR 1.0 12/02/2019   HGBA1C 6.0 (H) 12/02/2019   MICROALBUR 30 08/31/2019      Assessment & Plan:   1. Hypertensive heart disease without heart failure Well controlled.  No medication changes recommended. Continue healthy diet and exercise.  Labs drawn today - CBC with Differential/Platelet - Comprehensive metablic panel - Protime-INR - POCT URINALYSIS DIP (CLINITEK) - Urine Culture  2. DM type 2 with diabetic mixed hyperlipidemia (HCC) Control: good Recommend check feet daily. Recommend annual eye exams. Medicines: no changes. Continue to work on eating a healthy diet and exercise.  Labs drawn today.   - Hemoglobin A1c  3. Mixed hyperlipidemia The current medical regimen is effective;  continue present plan and medications. Recommend continue to work on eating healthy diet and exercise. Labs drawn today - Lipid panel  4. Senile purpura (HCC) Likely due to aspirin and fish oil but the benefit is worth the purpura.  Labs drawn today.  - Protime-INR  5. Coronary artery disease involving native coronary artery of native heart without angina pectoris Recommend continue to work on eating healthy diet and exercise. - EKG 12-Lead mild sinus bradycardia. - Protime-INR  6. Need for immunization against influenza - Flu Vaccine QUAD High Dose(Fluad)      Orders Placed This Encounter  Procedures  . Urine Culture  . Flu Vaccine QUAD High Dose(Fluad)  . CBC with Differential/Platelet  . Comprehensive metabolic panel  . Hemoglobin A1c  . Lipid panel  . Protime-INR  . Cardiovascular Risk Assessment  . POCT URINALYSIS DIP (CLINITEK)  . EKG 12-Lead     Follow-up: Return in about 3 months (around 03/03/2020) for fasting.Marland Kitchen  An After Visit Summary was printed and given to the patient.  Rochel Brome [facility] [phone]

## 2019-12-03 LAB — CBC WITH DIFFERENTIAL/PLATELET
Basophils Absolute: 0 10*3/uL (ref 0.0–0.2)
Basos: 0 %
EOS (ABSOLUTE): 0.1 10*3/uL (ref 0.0–0.4)
Eos: 2 %
Hematocrit: 44.5 % (ref 37.5–51.0)
Hemoglobin: 15 g/dL (ref 13.0–17.7)
Immature Grans (Abs): 0 10*3/uL (ref 0.0–0.1)
Immature Granulocytes: 0 %
Lymphocytes Absolute: 1.1 10*3/uL (ref 0.7–3.1)
Lymphs: 24 %
MCH: 30.1 pg (ref 26.6–33.0)
MCHC: 33.7 g/dL (ref 31.5–35.7)
MCV: 89 fL (ref 79–97)
Monocytes Absolute: 0.5 10*3/uL (ref 0.1–0.9)
Monocytes: 10 %
Neutrophils Absolute: 3 10*3/uL (ref 1.4–7.0)
Neutrophils: 64 %
Platelets: 168 10*3/uL (ref 150–450)
RBC: 4.98 x10E6/uL (ref 4.14–5.80)
RDW: 13.3 % (ref 11.6–15.4)
WBC: 4.7 10*3/uL (ref 3.4–10.8)

## 2019-12-03 LAB — COMPREHENSIVE METABOLIC PANEL
ALT: 26 IU/L (ref 0–44)
AST: 23 IU/L (ref 0–40)
Albumin/Globulin Ratio: 2.2 (ref 1.2–2.2)
Albumin: 4.7 g/dL — ABNORMAL HIGH (ref 3.6–4.6)
Alkaline Phosphatase: 77 IU/L (ref 44–121)
BUN/Creatinine Ratio: 17 (ref 10–24)
BUN: 14 mg/dL (ref 8–27)
Bilirubin Total: 1.1 mg/dL (ref 0.0–1.2)
CO2: 25 mmol/L (ref 20–29)
Calcium: 9.3 mg/dL (ref 8.6–10.2)
Chloride: 105 mmol/L (ref 96–106)
Creatinine, Ser: 0.82 mg/dL (ref 0.76–1.27)
GFR calc Af Amer: 96 mL/min/{1.73_m2} (ref >59)
GFR calc non Af Amer: 83 mL/min/{1.73_m2} (ref >59)
Globulin, Total: 2.1 g/dL (ref 1.5–4.5)
Glucose: 112 mg/dL — ABNORMAL HIGH (ref 65–99)
Potassium: 4.4 mmol/L (ref 3.5–5.2)
Sodium: 143 mmol/L (ref 134–144)
Total Protein: 6.8 g/dL (ref 6.0–8.5)

## 2019-12-03 LAB — HEMOGLOBIN A1C
Est. average glucose Bld gHb Est-mCnc: 126 mg/dL
Hgb A1c MFr Bld: 6 % — ABNORMAL HIGH (ref 4.8–5.6)

## 2019-12-03 LAB — LIPID PANEL
Chol/HDL Ratio: 2.5 ratio (ref 0.0–5.0)
Cholesterol, Total: 106 mg/dL (ref 100–199)
HDL: 43 mg/dL (ref >39)
LDL Chol Calc (NIH): 44 mg/dL (ref 0–99)
Triglycerides: 100 mg/dL (ref 0–149)
VLDL Cholesterol Cal: 19 mg/dL (ref 5–40)

## 2019-12-03 LAB — PROTIME-INR
INR: 1 (ref 0.9–1.2)
Prothrombin Time: 10.3 s (ref 9.1–12.0)

## 2019-12-03 LAB — CARDIOVASCULAR RISK ASSESSMENT

## 2019-12-05 LAB — URINE CULTURE

## 2019-12-05 NOTE — Progress Notes (Signed)
[id number] - [place], [place] - [address] [place] [address] Phone: [phone] Fax: [phone]      Your procedure is scheduled on Friday, October 29th.  Report to [facility] Main Entrance "A" at 11:00 A.M., and check in at the Admitting office.  Call this number if you have problems the morning of surgery:  [phone]  Call [phone] if you have any questions prior to your surgery date Monday-Friday 8am-4pm    Remember:  Do not eat or drink after midnight the night before your surgery     Take these medicines the morning of surgery with A SIP OF WATER   Atorvastatin (Lipitor)  Gabapentin (Neurontin)  Eye drops - if needed  Hydrocodone-Acetaminophen (Norco) - if needed  Nitroglycerin - if needed  Pantoprazole (Protonix)  Follow your surgeon's instructions on when to stop Aspirin.  If no instructions were given by your surgeon then you will need to call the office to get those instructions.    As of today, STOP taking any Aspirin (unless otherwise instructed by your surgeon) Aleve, Naproxen, Ibuprofen, Motrin, Advil, Goody's, BC's, all herbal medications, fish oil, and all vitamins.   WHAT DO I DO ABOUT MY DIABETES MEDICATION?   Marland Kitchen Do not take oral diabetes medicines (pills) the morning of surgery. - Metformin   HOW TO MANAGE YOUR DIABETES BEFORE AND AFTER SURGERY  Why is it important to control my blood sugar before and after surgery? . Improving blood sugar levels before and after surgery helps healing and can limit problems. . A way of improving blood sugar control is eating a healthy diet by: o  Eating less sugar and carbohydrates o  Increasing activity/exercise o  Talking with your doctor about reaching your blood sugar goals . High blood sugars (greater than 180 mg/dL) can raise your risk of infections and slow your recovery, so you will need to focus on controlling your diabetes during the weeks before surgery. . Make  sure that the doctor who takes care of your diabetes knows about your planned surgery including the date and location.  How do I manage my blood sugar before surgery? . Check your blood sugar at least 4 times a day, starting 2 days before surgery, to make sure that the level is not too high or low. . Check your blood sugar the morning of your surgery when you wake up and every 2 hours until you get to the Short Stay unit. o If your blood sugar is less than 70 mg/dL, you will need to treat for low blood sugar: - Do not take insulin. - Treat a low blood sugar (less than 70 mg/dL) with  cup of clear juice (cranberry or apple), 4 glucose tablets, OR glucose gel. - Recheck blood sugar in 15 minutes after treatment (to make sure it is greater than 70 mg/dL). If your blood sugar is not greater than 70 mg/dL on recheck, call [phone] for further instructions. . Report your blood sugar to the short stay nurse when you get to Short Stay.  . If you are admitted to the hospital after surgery: o Your blood sugar will be checked by the staff and you will probably be given insulin after surgery (instead of oral diabetes medicines) to make sure you have good blood sugar levels. o The goal for blood sugar control after surgery is 80-180 mg/dL.              DAY OF SURGERY:  Do not wear jewelry            Do not wear lotions, powders, colognes, or deodorant.            Men may shave face and neck.            Do not bring valuables to the hospital.            [facility] is not responsible for any belongings or valuables.  Do NOT Smoke (Tobacco/Vaping) or drink Alcohol 24 hours prior to your procedure If you use a CPAP at night, you may bring all equipment for your overnight stay.   Contacts, glasses, dentures or bridgework may not be worn into surgery.      For patients admitted to the hospital, discharge time will be determined by your treatment team.   Patients discharged the day of  surgery will not be allowed to drive home, and someone needs to stay with them for 24 hours.    Special instructions:   [facility]- Preparing For Surgery  Before surgery, you can play an important role. Because skin is not sterile, your skin needs to be as free of germs as possible. You can reduce the number of germs on your skin by washing with CHG (chlorahexidine gluconate) Soap before surgery.  CHG is an antiseptic cleaner which kills germs and bonds with the skin to continue killing germs even after washing.    Oral Hygiene is also important to reduce your risk of infection.  Remember - BRUSH YOUR TEETH THE MORNING OF SURGERY WITH YOUR REGULAR TOOTHPASTE  Please do not use if you have an allergy to CHG or antibacterial soaps. If your skin becomes reddened/irritated stop using the CHG.  Do not shave (including legs and underarms) for at least 48 hours prior to first CHG shower. It is OK to shave your face.  Please follow these instructions carefully.   1. Shower the NIGHT BEFORE SURGERY and the MORNING OF SURGERY with CHG Soap.   2. If you chose to wash your hair, wash your hair first as usual with your normal shampoo.  3. After you shampoo, rinse your hair and body thoroughly to remove the shampoo.  4. Use CHG as you would any other liquid soap. You can apply CHG directly to the skin and wash gently with a scrungie or a clean washcloth.   5. Apply the CHG Soap to your body ONLY FROM THE NECK DOWN.  Do not use on open wounds or open sores. Avoid contact with your eyes, ears, mouth and genitals (private parts). Wash Face and genitals (private parts)  with your normal soap.   6. Wash thoroughly, paying special attention to the area where your surgery will be performed.  7. Thoroughly rinse your body with warm water from the neck down.  8. DO NOT shower/wash with your normal soap after using and rinsing off the CHG Soap.  9. Pat yourself dry with a CLEAN TOWEL.  10. Wear CLEAN  PAJAMAS to bed the night before surgery  11. Place CLEAN SHEETS on your bed the night of your first shower and DO NOT SLEEP WITH PETS.   Day of Surgery: Wear Clean/Comfortable clothing the morning of surgery Do not apply any deodorants/lotions.   Remember to brush your teeth WITH YOUR REGULAR TOOTHPASTE.   Please read over the following fact sheets that you were given.

## 2019-12-06 ENCOUNTER — Encounter: Payer: Medicare PPO | Admitting: Gastroenterology

## 2019-12-06 ENCOUNTER — Other Ambulatory Visit (HOSPITAL_COMMUNITY)
Admission: RE | Admit: 2019-12-06 | Discharge: 2019-12-06 | Disposition: A | Discharge status: Home / Self Care | Payer: Medicare PPO | Source: Ambulatory Visit | Attending: Neurological Surgery | Admitting: Neurological Surgery

## 2019-12-06 ENCOUNTER — Other Ambulatory Visit: Payer: Self-pay

## 2019-12-06 ENCOUNTER — Encounter (HOSPITAL_COMMUNITY)
Admission: RE | Admit: 2019-12-06 | Discharge: 2019-12-06 | Disposition: A | Discharge status: Home / Self Care | Payer: Medicare PPO | Source: Ambulatory Visit | Attending: Neurological Surgery | Admitting: Neurological Surgery

## 2019-12-06 ENCOUNTER — Encounter (HOSPITAL_COMMUNITY): Payer: Self-pay

## 2019-12-06 ENCOUNTER — Ambulatory Visit (HOSPITAL_COMMUNITY)
Admission: RE | Admit: 2019-12-06 | Discharge: 2019-12-06 | Disposition: A | Discharge status: Home / Self Care | Payer: Medicare PPO | Source: Ambulatory Visit | Attending: Neurological Surgery | Admitting: Neurological Surgery

## 2019-12-06 DIAGNOSIS — M48061 Spinal stenosis, lumbar region without neurogenic claudication: Secondary | ICD-10-CM

## 2019-12-06 DIAGNOSIS — Z87442 Personal history of urinary calculi: Secondary | ICD-10-CM | POA: Diagnosis not present

## 2019-12-06 DIAGNOSIS — C61 Malignant neoplasm of prostate: Secondary | ICD-10-CM | POA: Diagnosis not present

## 2019-12-06 DIAGNOSIS — K219 Gastro-esophageal reflux disease without esophagitis: Secondary | ICD-10-CM | POA: Diagnosis present

## 2019-12-06 DIAGNOSIS — Z87891 Personal history of nicotine dependence: Secondary | ICD-10-CM | POA: Diagnosis not present

## 2019-12-06 DIAGNOSIS — J90 Pleural effusion, not elsewhere classified: Secondary | ICD-10-CM | POA: Diagnosis not present

## 2019-12-06 DIAGNOSIS — Z79899 Other long term (current) drug therapy: Secondary | ICD-10-CM | POA: Diagnosis not present

## 2019-12-06 DIAGNOSIS — E7800 Pure hypercholesterolemia, unspecified: Secondary | ICD-10-CM | POA: Diagnosis present

## 2019-12-06 DIAGNOSIS — Z885 Allergy status to narcotic agent status: Secondary | ICD-10-CM | POA: Diagnosis not present

## 2019-12-06 DIAGNOSIS — Z20822 Contact with and (suspected) exposure to covid-19: Secondary | ICD-10-CM | POA: Diagnosis present

## 2019-12-06 DIAGNOSIS — I480 Paroxysmal atrial fibrillation: Secondary | ICD-10-CM | POA: Diagnosis present

## 2019-12-06 DIAGNOSIS — Z8546 Personal history of malignant neoplasm of prostate: Secondary | ICD-10-CM | POA: Diagnosis not present

## 2019-12-06 DIAGNOSIS — M5126 Other intervertebral disc displacement, lumbar region: Secondary | ICD-10-CM | POA: Diagnosis present

## 2019-12-06 DIAGNOSIS — E782 Mixed hyperlipidemia: Secondary | ICD-10-CM | POA: Diagnosis present

## 2019-12-06 DIAGNOSIS — E119 Type 2 diabetes mellitus without complications: Secondary | ICD-10-CM | POA: Diagnosis present

## 2019-12-06 DIAGNOSIS — I2510 Atherosclerotic heart disease of native coronary artery without angina pectoris: Secondary | ICD-10-CM | POA: Insufficient documentation

## 2019-12-06 DIAGNOSIS — M1990 Unspecified osteoarthritis, unspecified site: Secondary | ICD-10-CM | POA: Diagnosis present

## 2019-12-06 DIAGNOSIS — Z01818 Encounter for other preprocedural examination: Secondary | ICD-10-CM | POA: Diagnosis not present

## 2019-12-06 DIAGNOSIS — Z923 Personal history of irradiation: Secondary | ICD-10-CM | POA: Diagnosis not present

## 2019-12-06 DIAGNOSIS — Z7982 Long term (current) use of aspirin: Secondary | ICD-10-CM | POA: Diagnosis not present

## 2019-12-06 DIAGNOSIS — M5116 Intervertebral disc disorders with radiculopathy, lumbar region: Secondary | ICD-10-CM | POA: Diagnosis not present

## 2019-12-06 DIAGNOSIS — R350 Frequency of micturition: Secondary | ICD-10-CM | POA: Diagnosis not present

## 2019-12-06 DIAGNOSIS — M4326 Fusion of spine, lumbar region: Secondary | ICD-10-CM | POA: Diagnosis not present

## 2019-12-06 DIAGNOSIS — I10 Essential (primary) hypertension: Secondary | ICD-10-CM | POA: Diagnosis present

## 2019-12-06 DIAGNOSIS — Z981 Arthrodesis status: Secondary | ICD-10-CM | POA: Diagnosis not present

## 2019-12-06 LAB — TYPE AND SCREEN
ABO/RH(D): O POS
Antibody Screen: NEGATIVE

## 2019-12-06 LAB — GLUCOSE, CAPILLARY: Glucose-Capillary: 88 mg/dL (ref 70–99)

## 2019-12-06 LAB — SURGICAL PCR SCREEN
MRSA, PCR: NEGATIVE
Staphylococcus aureus: NEGATIVE

## 2019-12-06 LAB — SARS CORONAVIRUS 2 (TAT 6-24 HRS): SARS Coronavirus 2: NEGATIVE

## 2019-12-06 NOTE — Progress Notes (Addendum)
PCP - Dr. Cyril Mourning cox  Cardiologist - Dr. Julianne Rice  Chest x-ray - 12/06/19  EKG - 12/02/19 (E)  Stress Test - 07/14/19 (E)  ECHO - 07/14/19 (E)  Cardiac Cath - 08/03/19 (E)  AICD-na PM-na LOOP-na  Sleep Study - Denies CPAP - Denies  LABS- 12/02/19: CBC w/D, CMP, PT 12/06/19: T/S, PCR, COVID  ASA- LD- 10/22  ERAS- No  HA1C- 12/02/19- 6.0 (E) Fasting Blood Sugar - today, 88 Checks Blood Sugar __0___ times a day- pt does not check his bs at home.  Anesthesia- Yes- cardiac history  Pt denies having chest pain, sob, or fever at this time. All instructions explained to the pt, with a verbal understanding of the material. Pt agrees to go over the instructions while at home for a better understanding. Pt also instructed to self quarantine after being tested for COVID-19. The opportunity to ask questions was provided.   Coronavirus Screening  Have you experienced the following symptoms:  Cough yes/no: No Fever (>100.73F)  yes/no: No Runny nose yes/no: No Sore throat yes/no: No Difficulty breathing/shortness of breath  yes/no: No  Have you or a family member traveled in the last 14 days and where? yes/no: No   If the patient indicates "YES" to the above questions, their PAT will be rescheduled to limit the exposure to others and, the surgeon will be notified. THE PATIENT WILL NEED TO BE ASYMPTOMATIC FOR 14 DAYS.   If the patient is not experiencing any of these symptoms, the PAT nurse will instruct them to NOT bring anyone with them to their appointment since they may have these symptoms or traveled as well.   Please remind your patients and families that hospital visitation restrictions are in effect and the importance of the restrictions.

## 2019-12-07 ENCOUNTER — Other Ambulatory Visit: Payer: Self-pay | Admitting: Neurological Surgery

## 2019-12-07 MED ORDER — ACETAMINOPHEN 500 MG PO TABS: 1000.0000 mg | ORAL_TABLET | ORAL | Status: AC

## 2019-12-07 MED ORDER — GABAPENTIN 300 MG PO CAPS: 300.0000 mg | ORAL_CAPSULE | ORAL | Status: AC

## 2019-12-07 NOTE — Anesthesia Preprocedure Evaluation (Addendum)
Anesthesia Evaluation    History of Anesthesia Complications (+) PONV  Airway Mallampati: II  TM Distance: >3 FB Neck ROM: Full    Dental no notable dental hx.    Pulmonary former smoker,    Pulmonary exam normal breath sounds clear to auscultation       Cardiovascular hypertension, + CAD  Normal cardiovascular exam+ dysrhythmias Atrial Fibrillation  Rhythm:Regular Rate:Normal     Neuro/Psych  Headaches, Depression    GI/Hepatic GERD  ,  Endo/Other  diabetes, Type 2, Oral Hypoglycemic Agents  Renal/GU      Musculoskeletal  (+) Arthritis , Osteoarthritis,    Abdominal   Peds  Hematology   Anesthesia Other Findings   Reproductive/Obstetrics                            Anesthesia Physical Anesthesia Plan  ASA: III  Anesthesia Plan: General   Post-op Pain Management:    Induction: Intravenous  PONV Risk Score and Plan: 3 and Ondansetron, Dexamethasone, Midazolam and Treatment may vary due to age or medical condition  Airway Management Planned: Oral ETT  Additional Equipment:   Intra-op Plan:   Post-operative Plan: Extubation in OR  Informed Consent: I have reviewed the patients History and Physical, chart, labs and discussed the procedure including the risks, benefits and alternatives for the proposed anesthesia with the patient or authorized representative who has indicated his/her understanding and acceptance.     Dental advisory given  Plan Discussed with: CRNA  Anesthesia Plan Comments: (PAT note by Karoline Caldwell, PA-C: Underwent recent cardiology eval for report of chest pain. He ultimately had LHC 08/03/19 showing nonobstructive disease, normal LVEF. Recommendation was to continue preventive therapy. Pt also has a history of paroxysmal afib, not on AC. Last EKG showed sinus rhythm.   CBC and CMET from 12/02/19 reviewed, unremarkable. DMII well controlled, A1c 6.0.  EKG  12/02/19:  Sinus bradycardia. Rate 58.  LHC 08/03/19: Prox Cx lesion is 30% stenosed. Prox LAD to Mid LAD lesion is 40% stenosed. Mid LAD lesion is 25% stenosed. The left ventricular systolic function is normal. LV end diastolic pressure is normal. The left ventricular ejection fraction is 55-65% by visual estimate. There is no aortic valve stenosis.   Nonobstructive CAD.  Continue preventive therapy.   Nuclear stress 07/14/19: Nuclear stress EF: 69%. The left ventricular ejection fraction is hyperdynamic (>65%). There was no ST segment deviation noted during stress. This is a low risk study. No evidence of ischemia or MI Normal EF.  TTE 07/14/19: 1. Sigmoid septum noted.. Left ventricular ejection fraction, by  estimation, is 55 to 60%. The left ventricle has normal function. The left  ventricle has no regional wall motion abnormalities. Left ventricular  diastolic parameters are consistent with  Grade I diastolic dysfunction (impaired relaxation).  2. Right ventricular systolic function is normal. The right ventricular  size is normal. There is normal pulmonary artery systolic pressure.  3. The mitral valve is normal in structure. No evidence of mitral valve  regurgitation. No evidence of mitral stenosis.  4. The aortic valve is normal in structure. Aortic valve regurgitation is  not visualized. No aortic stenosis is present.  5. 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  6. The inferior vena cava is normal in size with greater than 50%  respiratory variability, suggesting right atrial pressure of 3 mmHg.    )  Anesthesia Quick Evaluation  

## 2019-12-07 NOTE — Progress Notes (Signed)
Anesthesia Chart Review:  Underwent recent cardiology eval for report of chest pain. He ultimately had LHC 08/03/19 showing nonobstructive disease, normal LVEF. Recommendation was to continue preventive therapy. Pt also has a history of paroxysmal afib, not on AC. Last EKG showed sinus rhythm.   CBC and CMET from 12/02/19 reviewed, unremarkable. DMII well controlled, A1c 6.0.  EKG 12/02/19:  Sinus bradycardia. Rate 58.  LHC 08/03/19:  Prox Cx lesion is 30% stenosed.  Prox LAD to Mid LAD lesion is 40% stenosed.  Mid LAD lesion is 25% stenosed.  The left ventricular systolic function is normal.  LV end diastolic pressure is normal.  The left ventricular ejection fraction is 55-65% by visual estimate.  There is no aortic valve stenosis.   Nonobstructive CAD.  Continue preventive therapy.   Nuclear stress 07/14/19:  Nuclear stress EF: 69%.  The left ventricular ejection fraction is hyperdynamic (>65%).  There was no ST segment deviation noted during stress.  This is a low risk study.  No evidence of ischemia or MI  Normal EF.  TTE 07/14/19: 1. Sigmoid septum noted.. Left ventricular ejection fraction, by  estimation, is 55 to 60%. The left ventricle has normal function. The left  ventricle has no regional wall motion abnormalities. Left ventricular  diastolic parameters are consistent with  Grade I diastolic dysfunction (impaired relaxation).  2. Right ventricular systolic function is normal. The right ventricular  size is normal. There is normal pulmonary artery systolic pressure.  3. The mitral valve is normal in structure. No evidence of mitral valve  regurgitation. No evidence of mitral stenosis.  4. The aortic valve is normal in structure. Aortic valve regurgitation is  not visualized. No aortic stenosis is present.  5. 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  6. The inferior vena cava is normal in size with greater than 50%   respiratory variability, suggesting right atrial pressure of 3 mmHg.     Wynonia Musty [facility] Short Stay Center/Anesthesiology Phone [phone] 12/07/2019 11:04 AM

## 2019-12-08 DIAGNOSIS — R350 Frequency of micturition: Secondary | ICD-10-CM | POA: Diagnosis not present

## 2019-12-08 DIAGNOSIS — C61 Malignant neoplasm of prostate: Secondary | ICD-10-CM | POA: Diagnosis not present

## 2019-12-09 ENCOUNTER — Inpatient Hospital Stay (HOSPITAL_COMMUNITY): Payer: Medicare PPO | Admitting: Physician Assistant

## 2019-12-09 ENCOUNTER — Inpatient Hospital Stay (HOSPITAL_COMMUNITY): Payer: Medicare PPO | Admitting: Anesthesiology

## 2019-12-09 ENCOUNTER — Other Ambulatory Visit: Payer: Self-pay

## 2019-12-09 ENCOUNTER — Inpatient Hospital Stay (HOSPITAL_COMMUNITY)
Admission: RE | Admit: 2019-12-09 | Discharge: 2019-12-12 | DRG: 460 | Disposition: A | Discharge status: Home Health | Payer: Medicare PPO | Attending: Neurological Surgery | Admitting: Neurological Surgery

## 2019-12-09 ENCOUNTER — Encounter (HOSPITAL_COMMUNITY)
Admission: RE | Disposition: A | Discharge status: Home Health | Payer: Self-pay | Source: Home / Self Care | Attending: Neurological Surgery

## 2019-12-09 ENCOUNTER — Inpatient Hospital Stay (HOSPITAL_COMMUNITY): Payer: Medicare PPO

## 2019-12-09 ENCOUNTER — Encounter (HOSPITAL_COMMUNITY): Payer: Self-pay | Admitting: Neurological Surgery

## 2019-12-09 DIAGNOSIS — Z923 Personal history of irradiation: Secondary | ICD-10-CM

## 2019-12-09 DIAGNOSIS — M1990 Unspecified osteoarthritis, unspecified site: Secondary | ICD-10-CM | POA: Diagnosis present

## 2019-12-09 DIAGNOSIS — M4326 Fusion of spine, lumbar region: Secondary | ICD-10-CM | POA: Diagnosis not present

## 2019-12-09 DIAGNOSIS — E782 Mixed hyperlipidemia: Secondary | ICD-10-CM | POA: Diagnosis present

## 2019-12-09 DIAGNOSIS — M5126 Other intervertebral disc displacement, lumbar region: Secondary | ICD-10-CM | POA: Diagnosis present

## 2019-12-09 DIAGNOSIS — Z885 Allergy status to narcotic agent status: Secondary | ICD-10-CM

## 2019-12-09 DIAGNOSIS — I2510 Atherosclerotic heart disease of native coronary artery without angina pectoris: Secondary | ICD-10-CM | POA: Diagnosis present

## 2019-12-09 DIAGNOSIS — M48061 Spinal stenosis, lumbar region without neurogenic claudication: Secondary | ICD-10-CM | POA: Diagnosis present

## 2019-12-09 DIAGNOSIS — K219 Gastro-esophageal reflux disease without esophagitis: Secondary | ICD-10-CM | POA: Diagnosis present

## 2019-12-09 DIAGNOSIS — I10 Essential (primary) hypertension: Secondary | ICD-10-CM | POA: Diagnosis present

## 2019-12-09 DIAGNOSIS — E7800 Pure hypercholesterolemia, unspecified: Secondary | ICD-10-CM | POA: Diagnosis present

## 2019-12-09 DIAGNOSIS — Z7982 Long term (current) use of aspirin: Secondary | ICD-10-CM | POA: Diagnosis not present

## 2019-12-09 DIAGNOSIS — Z20822 Contact with and (suspected) exposure to covid-19: Secondary | ICD-10-CM | POA: Diagnosis present

## 2019-12-09 DIAGNOSIS — I480 Paroxysmal atrial fibrillation: Secondary | ICD-10-CM | POA: Diagnosis present

## 2019-12-09 DIAGNOSIS — Z8546 Personal history of malignant neoplasm of prostate: Secondary | ICD-10-CM

## 2019-12-09 DIAGNOSIS — E119 Type 2 diabetes mellitus without complications: Secondary | ICD-10-CM | POA: Diagnosis present

## 2019-12-09 DIAGNOSIS — Z87442 Personal history of urinary calculi: Secondary | ICD-10-CM | POA: Diagnosis not present

## 2019-12-09 DIAGNOSIS — Z981 Arthrodesis status: Secondary | ICD-10-CM

## 2019-12-09 DIAGNOSIS — Z87891 Personal history of nicotine dependence: Secondary | ICD-10-CM

## 2019-12-09 DIAGNOSIS — Z79899 Other long term (current) drug therapy: Secondary | ICD-10-CM

## 2019-12-09 DIAGNOSIS — Z419 Encounter for procedure for purposes other than remedying health state, unspecified: Secondary | ICD-10-CM

## 2019-12-09 HISTORY — DX: Arthrodesis status: Z98.1

## 2019-12-09 HISTORY — PX: LAMINECTOMY WITH POSTERIOR LATERAL ARTHRODESIS LEVEL 2: SHX6336

## 2019-12-09 LAB — GLUCOSE, CAPILLARY
Glucose-Capillary: 106 mg/dL — ABNORMAL HIGH (ref 70–99)
Glucose-Capillary: 184 mg/dL — ABNORMAL HIGH (ref 70–99)
Glucose-Capillary: 152 mg/dL — ABNORMAL HIGH (ref 70–99)

## 2019-12-09 SURGERY — LAMINECTOMY WITH POSTERIOR LATERAL ARTHRODESIS LEVEL 2
Anesthesia: General | Site: Back

## 2019-12-09 MED ORDER — BUPIVACAINE HCL (PF) 0.25 % IJ SOLN
INTRAMUSCULAR | Status: AC
  Filled 2019-12-09: qty 30

## 2019-12-09 MED ORDER — SODIUM CHLORIDE 0.9 % IV SOLN: INTRAVENOUS | Status: DC | PRN

## 2019-12-09 MED ORDER — DEXAMETHASONE SODIUM PHOSPHATE 10 MG/ML IJ SOLN
10.0000 mg | Freq: Once | INTRAMUSCULAR | Status: AC
  Administered 2019-12-09: 10 mg via INTRAVENOUS
  Filled 2019-12-09: qty 1

## 2019-12-09 MED ORDER — GABAPENTIN 300 MG PO CAPS
300.0000 mg | ORAL_CAPSULE | Freq: Two times a day (BID) | ORAL | Status: DC
  Administered 2019-12-09 – 2019-12-12 (×6): 300 mg via ORAL
  Filled 2019-12-09 (×6): qty 1

## 2019-12-09 MED ORDER — ROCURONIUM BROMIDE 10 MG/ML (PF) SYRINGE
PREFILLED_SYRINGE | INTRAVENOUS | Status: DC | PRN
  Administered 2019-12-09: 20 mg via INTRAVENOUS
  Administered 2019-12-09: 100 mg via INTRAVENOUS

## 2019-12-09 MED ORDER — SODIUM CHLORIDE 0.9% FLUSH: 3.0000 mL | INTRAVENOUS | Status: DC | PRN

## 2019-12-09 MED ORDER — CHLORHEXIDINE GLUCONATE 0.12 % MT SOLN
15.0000 mL | Freq: Once | OROMUCOSAL | Status: AC
  Administered 2019-12-09: 15 mL via OROMUCOSAL
  Filled 2019-12-09: qty 15

## 2019-12-09 MED ORDER — LISINOPRIL 20 MG PO TABS
20.0000 mg | ORAL_TABLET | Freq: Every day | ORAL | Status: DC
  Administered 2019-12-09 – 2019-12-12 (×4): 20 mg via ORAL
  Filled 2019-12-09 (×4): qty 1

## 2019-12-09 MED ORDER — HYDROMORPHONE HCL 1 MG/ML IJ SOLN
0.2500 mg | INTRAMUSCULAR | Status: DC | PRN
  Administered 2019-12-09 (×2): 0.25 mg via INTRAVENOUS

## 2019-12-09 MED ORDER — SODIUM CHLORIDE 0.9 % IV SOLN: 250.0000 mL | INTRAVENOUS | Status: DC

## 2019-12-09 MED ORDER — PROPOFOL 10 MG/ML IV BOLUS
INTRAVENOUS | Status: AC
  Filled 2019-12-09: qty 20

## 2019-12-09 MED ORDER — TAMSULOSIN HCL 0.4 MG PO CAPS
0.4000 mg | ORAL_CAPSULE | Freq: Every day | ORAL | Status: DC
  Administered 2019-12-09 – 2019-12-11 (×3): 0.4 mg via ORAL
  Filled 2019-12-09 (×3): qty 1

## 2019-12-09 MED ORDER — SENNA 8.6 MG PO TABS
1.0000 | ORAL_TABLET | Freq: Two times a day (BID) | ORAL | Status: DC
  Administered 2019-12-09 – 2019-12-12 (×6): 8.6 mg via ORAL
  Filled 2019-12-09 (×6): qty 1

## 2019-12-09 MED ORDER — SUGAMMADEX SODIUM 200 MG/2ML IV SOLN
INTRAVENOUS | Status: DC | PRN
  Administered 2019-12-09: 200 mg via INTRAVENOUS

## 2019-12-09 MED ORDER — FENTANYL CITRATE (PF) 250 MCG/5ML IJ SOLN
INTRAMUSCULAR | Status: AC
  Filled 2019-12-09: qty 5

## 2019-12-09 MED ORDER — MORPHINE SULFATE (PF) 2 MG/ML IV SOLN
2.0000 mg | INTRAVENOUS | Status: DC | PRN
  Administered 2019-12-09 (×2): 2 mg via INTRAVENOUS
  Filled 2019-12-09 (×2): qty 1

## 2019-12-09 MED ORDER — CHLORHEXIDINE GLUCONATE CLOTH 2 % EX PADS: 6.0000 | MEDICATED_PAD | Freq: Once | CUTANEOUS | Status: DC

## 2019-12-09 MED ORDER — BUPIVACAINE HCL (PF) 0.25 % IJ SOLN
INTRAMUSCULAR | Status: DC | PRN
  Administered 2019-12-09: 6 mL

## 2019-12-09 MED ORDER — THROMBIN 5000 UNITS EX SOLR
CUTANEOUS | Status: AC
  Filled 2019-12-09: qty 5000

## 2019-12-09 MED ORDER — PHENOL 1.4 % MT LIQD: 1.0000 | OROMUCOSAL | Status: DC | PRN

## 2019-12-09 MED ORDER — ROCURONIUM BROMIDE 10 MG/ML (PF) SYRINGE
PREFILLED_SYRINGE | INTRAVENOUS | Status: AC
  Filled 2019-12-09: qty 20

## 2019-12-09 MED ORDER — ASPIRIN EC 81 MG PO TBEC
81.0000 mg | DELAYED_RELEASE_TABLET | Freq: Every day | ORAL | Status: DC
  Administered 2019-12-09 – 2019-12-12 (×4): 81 mg via ORAL
  Filled 2019-12-09 (×4): qty 1

## 2019-12-09 MED ORDER — ONDANSETRON HCL 4 MG/2ML IJ SOLN
INTRAMUSCULAR | Status: AC
  Filled 2019-12-09: qty 2

## 2019-12-09 MED ORDER — VANCOMYCIN HCL 1000 MG IV SOLR
INTRAVENOUS | Status: DC | PRN
  Administered 2019-12-09: 1000 mg via TOPICAL

## 2019-12-09 MED ORDER — ROCURONIUM BROMIDE 10 MG/ML (PF) SYRINGE
PREFILLED_SYRINGE | INTRAVENOUS | Status: AC
  Filled 2019-12-09: qty 10

## 2019-12-09 MED ORDER — HEPARIN SODIUM (PORCINE) 1000 UNIT/ML IJ SOLN
INTRAMUSCULAR | Status: AC
  Filled 2019-12-09: qty 1

## 2019-12-09 MED ORDER — METHOCARBAMOL 500 MG PO TABS
500.0000 mg | ORAL_TABLET | Freq: Four times a day (QID) | ORAL | Status: DC | PRN
  Administered 2019-12-10 – 2019-12-12 (×3): 500 mg via ORAL
  Filled 2019-12-09 (×3): qty 1

## 2019-12-09 MED ORDER — 0.9 % SODIUM CHLORIDE (POUR BTL) OPTIME
TOPICAL | Status: DC | PRN
  Administered 2019-12-09: 1000 mL

## 2019-12-09 MED ORDER — LACTATED RINGERS IV SOLN: INTRAVENOUS | Status: DC

## 2019-12-09 MED ORDER — PANTOPRAZOLE SODIUM 40 MG PO TBEC
40.0000 mg | DELAYED_RELEASE_TABLET | Freq: Every day | ORAL | Status: DC
  Administered 2019-12-09 – 2019-12-12 (×4): 40 mg via ORAL
  Filled 2019-12-09 (×4): qty 1

## 2019-12-09 MED ORDER — LIDOCAINE 2% (20 MG/ML) 5 ML SYRINGE
INTRAMUSCULAR | Status: DC | PRN
  Administered 2019-12-09: 80 mg via INTRAVENOUS

## 2019-12-09 MED ORDER — HYDROMORPHONE HCL 1 MG/ML IJ SOLN
INTRAMUSCULAR | Status: AC
  Filled 2019-12-09: qty 1

## 2019-12-09 MED ORDER — METHOCARBAMOL 1000 MG/10ML IJ SOLN
500.0000 mg | Freq: Four times a day (QID) | INTRAVENOUS | Status: DC | PRN
  Filled 2019-12-09: qty 5

## 2019-12-09 MED ORDER — PHENYLEPHRINE HCL-NACL 10-0.9 MG/250ML-% IV SOLN
INTRAVENOUS | Status: DC | PRN
  Administered 2019-12-09: 25 ug/min via INTRAVENOUS

## 2019-12-09 MED ORDER — ONDANSETRON HCL 4 MG PO TABS: 4.0000 mg | ORAL_TABLET | Freq: Four times a day (QID) | ORAL | Status: DC | PRN

## 2019-12-09 MED ORDER — NITROGLYCERIN 0.4 MG SL SUBL: 0.4000 mg | SUBLINGUAL_TABLET | SUBLINGUAL | Status: DC | PRN

## 2019-12-09 MED ORDER — EPHEDRINE SULFATE-NACL 50-0.9 MG/10ML-% IV SOSY
PREFILLED_SYRINGE | INTRAVENOUS | Status: DC | PRN
  Administered 2019-12-09: 10 mg via INTRAVENOUS

## 2019-12-09 MED ORDER — ONDANSETRON HCL 4 MG/2ML IJ SOLN
4.0000 mg | Freq: Four times a day (QID) | INTRAMUSCULAR | Status: DC | PRN
  Administered 2019-12-12: 4 mg via INTRAVENOUS
  Filled 2019-12-09: qty 2

## 2019-12-09 MED ORDER — ACETAMINOPHEN 325 MG PO TABS: 650.0000 mg | ORAL_TABLET | ORAL | Status: DC | PRN

## 2019-12-09 MED ORDER — ONDANSETRON HCL 4 MG/2ML IJ SOLN
INTRAMUSCULAR | Status: DC | PRN
  Administered 2019-12-09: 4 mg via INTRAVENOUS

## 2019-12-09 MED ORDER — POTASSIUM CHLORIDE IN NACL 20-0.9 MEQ/L-% IV SOLN
INTRAVENOUS | Status: DC
  Filled 2019-12-09 (×3): qty 1000

## 2019-12-09 MED ORDER — THROMBIN 20000 UNITS EX SOLR: CUTANEOUS | Status: DC | PRN

## 2019-12-09 MED ORDER — ORAL CARE MOUTH RINSE: 15.0000 mL | Freq: Once | OROMUCOSAL | Status: AC

## 2019-12-09 MED ORDER — LIDOCAINE 2% (20 MG/ML) 5 ML SYRINGE
INTRAMUSCULAR | Status: AC
  Filled 2019-12-09: qty 5

## 2019-12-09 MED ORDER — CELECOXIB 200 MG PO CAPS
200.0000 mg | ORAL_CAPSULE | Freq: Two times a day (BID) | ORAL | Status: DC
  Administered 2019-12-09 – 2019-12-12 (×6): 200 mg via ORAL
  Filled 2019-12-09 (×6): qty 1

## 2019-12-09 MED ORDER — ARTHREX ANGEL - ACD-A SOLUTION (CHARTING ONLY) OPTIME
TOPICAL | Status: DC | PRN
  Administered 2019-12-09: 10 mL via TOPICAL

## 2019-12-09 MED ORDER — THROMBIN 5000 UNITS EX SOLR: OROMUCOSAL | Status: DC | PRN

## 2019-12-09 MED ORDER — PROPOFOL 10 MG/ML IV BOLUS
INTRAVENOUS | Status: DC | PRN
  Administered 2019-12-09: 140 mg via INTRAVENOUS

## 2019-12-09 MED ORDER — PROMETHAZINE HCL 25 MG/ML IJ SOLN
INTRAMUSCULAR | Status: AC
  Filled 2019-12-09: qty 1

## 2019-12-09 MED ORDER — CEFAZOLIN SODIUM-DEXTROSE 2-4 GM/100ML-% IV SOLN
2.0000 g | Freq: Three times a day (TID) | INTRAVENOUS | Status: AC
  Administered 2019-12-09 – 2019-12-10 (×2): 2 g via INTRAVENOUS
  Filled 2019-12-09 (×2): qty 100

## 2019-12-09 MED ORDER — INSULIN ASPART 100 UNIT/ML ~~LOC~~ SOLN
0.0000 [IU] | Freq: Three times a day (TID) | SUBCUTANEOUS | Status: DC
  Administered 2019-12-10 (×3): 2 [IU] via SUBCUTANEOUS

## 2019-12-09 MED ORDER — ACETAMINOPHEN 650 MG RE SUPP: 650.0000 mg | RECTAL | Status: DC | PRN

## 2019-12-09 MED ORDER — CEFAZOLIN SODIUM-DEXTROSE 2-4 GM/100ML-% IV SOLN
2.0000 g | INTRAVENOUS | Status: AC
  Administered 2019-12-09: 2 g via INTRAVENOUS
  Filled 2019-12-09: qty 100

## 2019-12-09 MED ORDER — MENTHOL 3 MG MT LOZG: 1.0000 | LOZENGE | OROMUCOSAL | Status: DC | PRN

## 2019-12-09 MED ORDER — VANCOMYCIN HCL 1000 MG IV SOLR
INTRAVENOUS | Status: AC
  Filled 2019-12-09: qty 1000

## 2019-12-09 MED ORDER — AMISULPRIDE (ANTIEMETIC) 5 MG/2ML IV SOLN: 10.0000 mg | Freq: Once | INTRAVENOUS | Status: DC | PRN

## 2019-12-09 MED ORDER — HYDROCODONE-ACETAMINOPHEN 10-325 MG PO TABS
2.0000 | ORAL_TABLET | ORAL | Status: DC | PRN
  Administered 2019-12-09 – 2019-12-12 (×9): 2 via ORAL
  Filled 2019-12-09 (×9): qty 2

## 2019-12-09 MED ORDER — METFORMIN HCL 500 MG PO TABS
500.0000 mg | ORAL_TABLET | Freq: Every day | ORAL | Status: DC
  Administered 2019-12-10 – 2019-12-12 (×3): 500 mg via ORAL
  Filled 2019-12-09 (×3): qty 1

## 2019-12-09 MED ORDER — FENTANYL CITRATE (PF) 250 MCG/5ML IJ SOLN
INTRAMUSCULAR | Status: DC | PRN
  Administered 2019-12-09: 50 ug via INTRAVENOUS
  Administered 2019-12-09: 25 ug via INTRAVENOUS
  Administered 2019-12-09: 100 ug via INTRAVENOUS
  Administered 2019-12-09: 50 ug via INTRAVENOUS
  Administered 2019-12-09: 25 ug via INTRAVENOUS
  Administered 2019-12-09 (×2): 50 ug via INTRAVENOUS
  Administered 2019-12-09: 100 ug via INTRAVENOUS
  Administered 2019-12-09: 50 ug via INTRAVENOUS

## 2019-12-09 MED ORDER — SODIUM CHLORIDE 0.9% FLUSH
3.0000 mL | Freq: Two times a day (BID) | INTRAVENOUS | Status: DC
  Administered 2019-12-09 – 2019-12-12 (×5): 3 mL via INTRAVENOUS

## 2019-12-09 MED ORDER — THROMBIN 20000 UNITS EX SOLR
CUTANEOUS | Status: AC
  Filled 2019-12-09: qty 20000

## 2019-12-09 MED ORDER — POLYVINYL ALCOHOL 1.4 % OP SOLN
1.0000 [drp] | Freq: Two times a day (BID) | OPHTHALMIC | Status: DC | PRN
  Filled 2019-12-09: qty 15

## 2019-12-09 MED ORDER — PROMETHAZINE HCL 25 MG/ML IJ SOLN
6.2500 mg | INTRAMUSCULAR | Status: DC | PRN
  Administered 2019-12-09: 6.25 mg via INTRAVENOUS

## 2019-12-09 SURGICAL SUPPLY — 73 items
ADH SKN CLS APL DERMABOND .7 (GAUZE/BANDAGES/DRESSINGS) ×1
APL SKNCLS STERI-STRIP NONHPOA (GAUZE/BANDAGES/DRESSINGS) ×1
BASKET BONE COLLECTION (BASKET) ×2 IMPLANT
BENZOIN TINCTURE PRP APPL 2/3 (GAUZE/BANDAGES/DRESSINGS) ×3 IMPLANT
BLADE CLIPPER SURG (BLADE) ×2 IMPLANT
BONE CANC CHIPS 40CC CAN1/2 (Bone Implant) ×3 IMPLANT
BUR CARBIDE MATCH 3.0 (BURR) ×3 IMPLANT
CANISTER SUCT 3000ML PPV (MISCELLANEOUS) ×5 IMPLANT
CHIPS CANC BONE 40CC CAN1/2 (Bone Implant) ×1 IMPLANT
CLOSURE WOUND 1/2 X4 (GAUZE/BANDAGES/DRESSINGS) ×1
CNTNR URN SCR LID CUP LEK RST (MISCELLANEOUS) ×1 IMPLANT
CONT SPEC 4OZ STRL OR WHT (MISCELLANEOUS) ×3
COVER BACK TABLE 60X90IN (DRAPES) ×3 IMPLANT
COVER WAND RF STERILE (DRAPES) ×1 IMPLANT
DERMABOND ADVANCED (GAUZE/BANDAGES/DRESSINGS) ×2
DERMABOND ADVANCED .7 DNX12 (GAUZE/BANDAGES/DRESSINGS) IMPLANT
DIFFUSER DRILL AIR PNEUMATIC (MISCELLANEOUS) ×3 IMPLANT
DRAPE C-ARM 42X72 X-RAY (DRAPES) ×2 IMPLANT
DRAPE C-ARMOR (DRAPES) ×2 IMPLANT
DRAPE LAPAROTOMY 100X72X124 (DRAPES) ×3 IMPLANT
DRAPE SURG 17X23 STRL (DRAPES) ×3 IMPLANT
DRSG OPSITE 4X5.5 SM (GAUZE/BANDAGES/DRESSINGS) ×2 IMPLANT
DRSG OPSITE POSTOP 4X10 (GAUZE/BANDAGES/DRESSINGS) ×2 IMPLANT
DURAPREP 26ML APPLICATOR (WOUND CARE) ×3 IMPLANT
ELECT REM PT RETURN 9FT ADLT (ELECTROSURGICAL) ×3
ELECTRODE REM PT RTRN 9FT ADLT (ELECTROSURGICAL) ×1 IMPLANT
EVACUATOR 1/8 PVC DRAIN (DRAIN) ×2 IMPLANT
GAUZE 4X4 16PLY RFD (DISPOSABLE) IMPLANT
GLOVE BIO SURGEON STRL SZ7 (GLOVE) ×4 IMPLANT
GLOVE BIO SURGEON STRL SZ8 (GLOVE) ×6 IMPLANT
GLOVE BIOGEL PI IND STRL 7.0 (GLOVE) IMPLANT
GLOVE BIOGEL PI IND STRL 7.5 (GLOVE) IMPLANT
GLOVE BIOGEL PI INDICATOR 7.0 (GLOVE) ×4
GLOVE BIOGEL PI INDICATOR 7.5 (GLOVE) ×8
GLOVE SURG SS PI 7.0 STRL IVOR (GLOVE) ×10 IMPLANT
GOWN STRL REUS W/ TWL LRG LVL3 (GOWN DISPOSABLE) IMPLANT
GOWN STRL REUS W/ TWL XL LVL3 (GOWN DISPOSABLE) ×2 IMPLANT
GOWN STRL REUS W/TWL 2XL LVL3 (GOWN DISPOSABLE) IMPLANT
GOWN STRL REUS W/TWL LRG LVL3 (GOWN DISPOSABLE) ×9
GOWN STRL REUS W/TWL XL LVL3 (GOWN DISPOSABLE) ×9
GRAFT BNE CHIP CANC 1-8 40 (Bone Implant) IMPLANT
GRAFT BONE PROTEIOS LRG 5CC (Orthopedic Implant) ×2 IMPLANT
HEMOSTAT POWDER KIT SURGIFOAM (HEMOSTASIS) ×2 IMPLANT
KIT BASIN OR (CUSTOM PROCEDURE TRAY) ×3 IMPLANT
KIT BONE MRW ASP ANGEL CPRP (KITS) ×2 IMPLANT
KIT TURNOVER KIT B (KITS) ×3 IMPLANT
NDL HYPO 18GX1.5 BLUNT FILL (NEEDLE) IMPLANT
NDL HYPO 25X1 1.5 SAFETY (NEEDLE) ×1 IMPLANT
NEEDLE HYPO 18GX1.5 BLUNT FILL (NEEDLE) ×3 IMPLANT
NEEDLE HYPO 25X1 1.5 SAFETY (NEEDLE) ×3 IMPLANT
NS IRRIG 1000ML POUR BTL (IV SOLUTION) ×3 IMPLANT
PACK LAMINECTOMY NEURO (CUSTOM PROCEDURE TRAY) ×3 IMPLANT
PAD ARMBOARD 7.5X6 YLW CONV (MISCELLANEOUS) ×9 IMPLANT
PUTTY DBM ALLOSYNC PURE 10CC (Putty) ×2 IMPLANT
ROD LORD LIPPED TI 5.5X80 (Rod) ×4 IMPLANT
SCREW CANC SHANK MOD 6.5X35 (Screw) ×8 IMPLANT
SCREW CORT SHANK MOD 6.5X40 (Screw) ×4 IMPLANT
SCREW POLYAXIAL TULIP (Screw) ×12 IMPLANT
SET SCREW (Screw) ×18 IMPLANT
SET SCREW SPNE (Screw) IMPLANT
SPONGE LAP 4X18 RFD (DISPOSABLE) IMPLANT
SPONGE SURGIFOAM ABS GEL 100 (HEMOSTASIS) ×3 IMPLANT
STRIP CLOSURE SKIN 1/2X4 (GAUZE/BANDAGES/DRESSINGS) ×3 IMPLANT
SUT VIC AB 0 CT1 18XCR BRD8 (SUTURE) ×1 IMPLANT
SUT VIC AB 0 CT1 8-18 (SUTURE) ×6
SUT VIC AB 2-0 CP2 18 (SUTURE) ×3 IMPLANT
SUT VIC AB 3-0 SH 8-18 (SUTURE) ×6 IMPLANT
SYR 20CC LL (SYRINGE) ×2 IMPLANT
SYR 5ML LL (SYRINGE) ×2 IMPLANT
TOWEL GREEN STERILE (TOWEL DISPOSABLE) ×3 IMPLANT
TOWEL GREEN STERILE FF (TOWEL DISPOSABLE) ×3 IMPLANT
TRAY FOLEY MTR SLVR 16FR STAT (SET/KITS/TRAYS/PACK) ×2 IMPLANT
WATER STERILE IRR 1000ML POUR (IV SOLUTION) ×3 IMPLANT

## 2019-12-09 NOTE — Anesthesia Procedure Notes (Signed)
Procedure Name: Intubation Date/Time: 12/09/2019 1:24 PM Performed by: Kyung Rudd, CRNA Pre-anesthesia Checklist: Patient identified, Emergency Drugs available, Suction available and Patient being monitored Patient Re-evaluated:Patient Re-evaluated prior to induction Oxygen Delivery Method: Circle system utilized Preoxygenation: Pre-oxygenation with 100% oxygen Induction Type: IV induction Ventilation: Mask ventilation without difficulty and Oral airway inserted - appropriate to patient size Laryngoscope Size: Mac and 4 Grade View: Grade II Tube type: Oral Tube size: 7.5 mm Number of attempts: 1 Airway Equipment and Method: Stylet Placement Confirmation: ETT inserted through vocal cords under direct vision,  positive ETCO2 and breath sounds checked- equal and bilateral Secured at: 21 cm Tube secured with: Tape Dental Injury: Teeth and Oropharynx as per pre-operative assessment

## 2019-12-09 NOTE — H&P (Signed)
Subjective: Patient is a 79 y.o. male admitted for R leg pain. Onset of symptoms was several months ago, gradually worsening since that time.  The pain is rated severe, unremitting, and is located at the across the lower back and radiates to R thigh. The pain is described as aching and occurs all day. The symptoms have been progressive. Symptoms are exacerbated by exercise. MRI or CT showed HNP with adjacent level stenosis   Past Medical History:  Diagnosis Date  . Arthritis    on meds  . Atrial fibrillation ([place])   . Cancer ([place]) 10/2015   prostate cancer treated with radiation  . Cataract    sx-bilaterally  . Colitis 11/22/2018  . 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 Depression 04/04/2019  . Diabetes mellitus without complication ([place])    Type II  . Diarrhea 11/21/2018  . Dyslipidemia 06/17/2016  . Dysrhythmia   . Essential hypertension 06/17/2016  . GERD (gastroesophageal reflux disease)    on meds  . Headache   . History of hiatal hernia   . History of kidney stones   . History of prostate cancer 05/20/2019  . Hypercholesteremia 04/04/2019  . Hyperlipemia   . Hypertension   . Impaired fasting blood sugar 05/20/2019  . Malignant neoplasm prostate ([place]) 05/20/2019  . Paroxysmal atrial fibrillation ([place]) 04/04/2019  . PONV (postoperative nausea and vomiting)   . 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 S/P lumbar spinal fusion 09/29/2013  . Sepsis ([place]) 11/22/2018    Past Surgical History:  Procedure Laterality Date  . anal fissures    . ANTERIOR CERVICAL DECOMP/DISCECTOMY FUSION N/A 03/04/2018   Procedure: Anterior Cervical Decompression Fusion - Cervical three-Cervical four - Cervical four-Cervical five;  Surgeon: Eustace Moore, MD;  Location: [facility];  Service: Neurosurgery;  Laterality: N/A;  . BACK SURGERY    . CARDIAC CATHETERIZATION     no PCI  . CARPAL TUNNEL  RELEASE Left 03/04/2018   Procedure: Carpal Tunnel Release - left;  Surgeon: Eustace Moore, MD;  Location: [facility];  Service: Neurosurgery;  Laterality: Left;  . CHOLECYSTECTOMY    . COLONOSCOPY W/ POLYPECTOMY  08/30/2012   Colonic polyp status post polypectomy.  Pancolonic diverticulosis predominantly in the sigmoid colon. Small internal hemorrhoids. Moderate internal hemorhroids.   . ESOPHAGOGASTRODUODENOSCOPY  08/04/2016   Schatzkis ring status post esophageal dilatation. Small hiatal hernia. Mild gastritis.   Marland Kitchen EYE SURGERY     cataract removal bilateral eyes  . FOOT SURGERY     left heel  . HERNIA REPAIR    . LEFT HEART CATH AND CORONARY ANGIOGRAPHY N/A 08/03/2019   Procedure: LEFT HEART CATH AND CORONARY ANGIOGRAPHY;  Surgeon: Jettie Booze, MD;  Location: [facility];  Service: Cardiovascular;  Laterality: N/A;  . SHOULDER SURGERY     bilateral  . WISDOM TOOTH EXTRACTION      Prior to Admission medications   Medication Sig Start Date End Date Taking? Authorizing Provider  aspirin EC 81 MG tablet Take 1 tablet (81 mg total) by mouth daily. 11/11/19  Yes Cox, Kirsten, MD  atorvastatin (LIPITOR) 40 MG tablet Take 40 mg by mouth daily.   Yes [provider]  cholecalciferol (VITAMIN D) 1000 UNITS tablet Take 1,000 Units by mouth daily.   Yes [provider]  gabapentin (NEURONTIN) 300 MG capsule Take 300 mg by mouth 2 (two) times daily.  Yes [provider]  Glycerin-Polysorbate 80 (REFRESH DRY EYE THERAPY) 1-1 % SOLN Place 1 drop into both eyes 2 (two) times daily as needed (for dry eyes).    Yes [provider]  HYDROcodone-acetaminophen (NORCO) 10-325 MG tablet Take 1 tablet by mouth daily as needed for severe pain. 11/10/19  Yes [provider]  lisinopril (ZESTRIL) 20 MG tablet Take 1 tablet (20 mg total) by mouth daily. 08/05/19  Yes Revankar, Reita Cliche, MD  meloxicam (MOBIC) 15 MG tablet Take 15 mg by mouth daily.   Yes  [provider]  metFORMIN (GLUCOPHAGE) 500 MG tablet TAKE 1 TABLET BY MOUTH EVERY DAY WITH BREAKFAST Patient taking differently: Take 500 mg by mouth daily with breakfast.  09/15/19  Yes Marge Duncans, PA-C  Multiple Vitamins-Minerals (PRESERVISION AREDS 2 PO) Take 1 tablet by mouth in the morning and at bedtime.   Yes [provider]  Omega-3 Fatty Acids (FISH OIL) 1200 MG CAPS Take 1,200 mg by mouth daily.    Yes [provider]  pantoprazole (PROTONIX) 40 MG tablet Take 1 tablet (40 mg total) by mouth daily. 10/26/19  Yes Cox, Kirsten, MD  tamsulosin (FLOMAX) 0.4 MG CAPS capsule Take 0.4 mg by mouth daily after supper.  01/27/17  Yes [provider]  nitroGLYCERIN (NITROSTAT) 0.4 MG SL tablet Place 1 tablet (0.4 mg total) under the tongue every 5 (five) minutes x 3 doses as needed for chest pain. 06/28/19   Revankar, Reita Cliche, MD   Allergies  Allergen Reactions  . Oxycontin [Oxycodone] Other (See Comments)    loopy    Social History   Tobacco Use  . Smoking status: Former Smoker    Packs/day: 0.50    Years: 35.00    Pack years: 17.50    Types: Cigarettes    Quit date: 02/10/1993    Years since quitting: 26.8  . Smokeless tobacco: Former Systems developer    Types: [place] date: 02/10/2006  Substance Use Topics  . Alcohol use: Yes    Alcohol/week: 1.0 standard drink    Types: 1 Cans of beer per week    Comment: occ    Family History  Problem Relation Age of Onset  . Colon cancer Neg Hx   . Esophageal cancer Neg Hx   . Rectal cancer Neg Hx   . Stomach cancer Neg Hx   . Colon polyps Neg Hx      Review of Systems  Positive ROS: neg  All other systems have been reviewed and were otherwise negative with the exception of those mentioned in the HPI and as above.  Objective: Vital signs in last 24 hours: Temp:  [97.5 F (36.4 C)] 97.5 F (36.4 C) (10/29 1102) Pulse Rate:  [79] 79 (10/29 1102) Resp:  [18] 18 (10/29 1102) BP: (178)/(83) 178/83 (10/29  1102) SpO2:  [97 %] 97 % (10/29 1102) Weight:  [92.2 kg] 92.2 kg (10/29 1102)  General Appearance: Alert, cooperative, no distress, appears stated age Head: Normocephalic, without obvious abnormality, atraumatic Eyes: PERRL, conjunctiva/corneas clear, EOM's intact    Neck: Supple, symmetrical, trachea midline Back: Symmetric, no curvature, ROM normal, no CVA tenderness Lungs:  respirations unlabored Heart: Regular rate and rhythm Abdomen: Soft, non-tender Extremities: Extremities normal, atraumatic, no cyanosis or edema Pulses: 2+ and symmetric all extremities Skin: Skin color, texture, turgor normal, no rashes or lesions  NEUROLOGIC:   Mental status: Alert and oriented x4,  no aphasia, good attention span, fund of knowledge,  and memory Motor Exam - grossly normal Sensory Exam - grossly normal Reflexes: 1+ Coordination - grossly normal Gait - grossly normal Balance - grossly normal Cranial Nerves: I: smell Not tested  II: visual acuity  OS: nl    OD: nl  II: visual fields Full to confrontation  II: pupils Equal, round, reactive to light  III,VII: ptosis None  III,IV,VI: extraocular muscles  Full ROM  V: mastication Normal  V: facial light touch sensation  Normal  V,VII: corneal reflex  Present  VII: facial muscle function - upper  Normal  VII: facial muscle function - lower Normal  VIII: hearing Not tested  IX: soft palate elevation  Normal  IX,X: gag reflex Present  XI: trapezius strength  5/5  XI: sternocleidomastoid strength 5/5  XI: neck flexion strength  5/5  XII: tongue strength  Normal    Data Review Lab Results  Component Value Date   WBC 4.7 12/02/2019   HGB 15.0 12/02/2019   HCT 44.5 12/02/2019   MCV 89 12/02/2019   PLT 168 12/02/2019   Lab Results  Component Value Date   NA 143 12/02/2019   K 4.4 12/02/2019   CL 105 12/02/2019   CO2 25 12/02/2019   BUN 14 12/02/2019   CREATININE 0.82 12/02/2019   GLUCOSE 112 (H) 12/02/2019   Lab Results   Component Value Date   INR 1.0 12/02/2019    Assessment/Plan:  Estimated body mass index is 28.34 kg/m as calculated from the following:   Height as of this encounter: 5\' 11"  (1.803 m).   Weight as of this encounter: 92.2 kg. 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 Patient has failed a reasonable attempt at conservative therapy.  I explained the condition and procedure to the patient and answered any questions.  Patient wishes to proceed with procedure as planned. Understands risks/ benefits and typical outcomes of procedure.   Eustace Moore 12/09/2019 12:59 PM

## 2019-12-09 NOTE — Op Note (Signed)
12/09/2019  4:26 PM  PATIENT:  [name]  79 y.o. 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 Decompressive lumbar laminectomy, medial facetectomy and foraminotomies L1-2  L2-3 bilaterally , with discectomy L1-2 on the right 2. Posterior fixation L1-L3 inclusive using Alphatec cortical pedicle screws.  3. Intertransverse arthrodesis L1-2 L2-3 using morcellized autograft and allograft soaked with bone marrow aspirate obtained through a separate fascial incision over the right iliac crest. 4. Removal of signal instrumentation L3-L5  SURGEON:  Sherley Bounds, MD  ASSISTANTS: Glenford Peers, FNP  ANESTHESIA:  General  EBL: 100 ml  Total I/O In: 1000 [I.V.:1000] Out: 450 [Urine:350; Blood:100]  BLOOD ADMINISTERED:none  DRAINS: none   INDICATION FOR PROCEDURE: This patient presented with back and right anterior thigh pain. Imaging revealed adjacent level stenosis at L2-3 and a disc herniation at L1-2 on the right. The patient tried a reasonable attempt at conservative medical measures without relief. I recommended decompression and instrumented fusion to address the stenosis as well as the segmental  instability.  Patient understood the risks, benefits, and alternatives and potential outcomes and wished to proceed.  PROCEDURE DETAILS:  The patient was brought to the operating room. After induction of generalized endotracheal anesthesia the patient was rolled into the prone position on chest rolls and all pressure points were padded. The patient's lumbar region was cleaned and then prepped with DuraPrep and draped in the usual sterile fashion. Anesthesia was injected and then a dorsal midline incision was made and carried down to the lumbosacral fascia. The fascia was opened and the paraspinous musculature was taken down in a  subperiosteal fashion to expose L1-2 and L2-3 as well as the previously placed instrumentation. I removed the caps from the previous tulips, I remove the rods and then remove the L3 pedicle screws. Unfortunately, I could not angle the tulip heads in a way to get the L4 and L5 pedicle screws out. So I left them in place but disconnected from the rods. A self-retaining retractor was placed. Intraoperative fluoroscopy confirmed my level, and I started with placement of the L1, L2 and L3 cortical pedicle screws. The pedicle screw entry zones were identified utilizing surface landmarks and  AP and lateral fluoroscopy. I scored the cortex with the high-speed drill and then used the hand drill to drill an upward and outward direction into the pedicle. I then tapped line to line. I then placed a 6.5 x 35 mm cortical pedicle screw into the pedicles of L1, L2, and L3 bilaterally.  I then dissected in a suprafascial plane to expose the iliac crest.  Opened the fascia and we used a Jamshidi needle to extract 60 cc of bone marrow aspirate from the iliac crest with the assistance of my nurse practitioner.  This was then spun down by [facility] device and 2 to 4 cc of  BMAC was soaked on morselized allograft for later arthrodesis.  I dried the hole with Surgifoam and closed the fascia.  I then turned my attention to the decompression and complete lumbar laminectomies, medial- facetectomies, and foraminotomies were performed at L1-2 and L2-3.  My nurse practitioner was directly involved in the decompression and exposure of the neural elements. A generous decompression and generous foraminotomy was undertaken in order to adequately decompress the neural elements and address the patient's leg pain. The yellow ligament was removed  to expose the underlying dura and nerve roots, and generous foraminotomies were performed to adequately decompress the neural elements. Both the exiting and traversing nerve roots were decompressed on both sides  until a coronary dilator passed easily along the nerve roots. At L1-2 on the right I found a fairly significant subannular disc herniation. I incised the disc and performed a thorough discectomy to decompress the L2 nerve root on the right.  We then decorticated the transverse processes and laid a mixture of morcellized autograft and allograft out over these to perform intertransverse arthrodesis at L1-L3 bilaterally. We then placed lordotic rods into the multiaxial screw heads of the pedicle screws and locked these in position with the locking caps and anti-torque device. We then checked our construct with AP and lateral fluoroscopy. Irrigated with copious amounts of bacitracin-containing saline solution. Inspected the nerve roots once again to assure adequate decompression, lined to the dura with Gelfoam, placed powdered vancomycin into the wound, and then we closed the muscle and the fascia with 0 Vicryl. Closed the subcutaneous tissues with 2-0 Vicryl and subcuticular tissues with 3-0 Vicryl. The skin was closed with benzoin and Steri-Strips. Dressing was then applied, the patient was awakened from general anesthesia and transported to the recovery room in stable condition. At the end of the procedure all sponge, needle and instrument counts were correct.   PLAN OF CARE: admit to inpatient  PATIENT DISPOSITION:  PACU - hemodynamically stable.   Delay start of Pharmacological VTE agent (>24hrs) due to surgical blood loss or risk of bleeding:  yes

## 2019-12-09 NOTE — Anesthesia Postprocedure Evaluation (Signed)
Anesthesia Post Note  Patient: [name]  Procedure(s) Performed: Laminectomy and Foraminotomy - Lumbar one-two, Lumbar two-three, posterolateral instrumented fusion Lumbar one-three, removal of instrumentation Lumbar three-five (N/A Back)     Patient location during evaluation: PACU Anesthesia Type: General Level of consciousness: awake Pain management: pain level controlled Vital Signs Assessment: post-procedure vital signs reviewed and stable Respiratory status: spontaneous breathing, nonlabored ventilation, respiratory function stable and patient connected to nasal cannula oxygen Cardiovascular status: blood pressure returned to baseline and stable Postop Assessment: no apparent nausea or vomiting Anesthetic complications: no   No complications documented.  Last Vitals:  Vitals:   12/09/19 1811 12/09/19 2006  BP: (!) 144/80 130/83  Pulse: 75 78  Resp: 18 18  Temp: 36.8 C 36.6 C  SpO2: 100% 96%    Last Pain:  Vitals:   12/09/19 2006  TempSrc: Oral  PainSc:                  [name] [name]

## 2019-12-09 NOTE — Transfer of Care (Signed)
Immediate Anesthesia Transfer of Care Note  Patient: [name]  Procedure(s) Performed: Laminectomy and Foraminotomy - Lumbar one-two, Lumbar two-three, posterolateral instrumented fusion Lumbar one-three, removal of instrumentation Lumbar three-five (N/A Back)  Patient Location: PACU  Anesthesia Type:General  Level of Consciousness: awake, alert , oriented and patient cooperative  Airway & Oxygen Therapy: Patient Spontanous Breathing and Patient connected to nasal cannula oxygen  Post-op Assessment: Report given to RN and Post -op Vital signs reviewed and stable  Post vital signs: Reviewed and stable  Last Vitals:  Vitals Value Taken Time  BP 143/71 12/09/19 1627  Temp    Pulse 81 12/09/19 1630  Resp 7 12/09/19 1630  SpO2 93 % 12/09/19 1630  Vitals shown include unvalidated device data.  Last Pain:  Vitals:   12/09/19 1115  TempSrc:   PainSc: 9       Patients Stated Pain Goal: 3 (61/90/12 2241)  Complications: No complications documented.

## 2019-12-10 LAB — GLUCOSE, CAPILLARY
Glucose-Capillary: 130 mg/dL — ABNORMAL HIGH (ref 70–99)
Glucose-Capillary: 134 mg/dL — ABNORMAL HIGH (ref 70–99)
Glucose-Capillary: 140 mg/dL — ABNORMAL HIGH (ref 70–99)
Glucose-Capillary: 153 mg/dL — ABNORMAL HIGH (ref 70–99)

## 2019-12-10 NOTE — Evaluation (Addendum)
Occupational Therapy Evaluation Patient Details Name: [name] MRN: [id number] DOB: 11/29/1940 Today's Date: 12/10/2019    History of Present Illness Patient is an 79 y/o male admitted for R LE pain with MRI positive for HNP and stenosis.  PMH positive for arhtritis, a-fib, prostat CA, CAD, DM, HTN, GERD, prior cervical and lumbar fusions.  Underwent decompressive laminectomy, facetectomy, foarminotomies bilateral L1-2, L2-3 with posterior fixation L1-3 on 12/09/2019.   Clinical Impression   Patient admitted for the diagnosis and procedure above.  He presents with low back pain, decreased stand balance, and decreased activity tolerance which limits ADL, toilet, functional mobility independence in the acute setting.  At home he lives alone and needs no assist.  Currently he needs up to [place] A for UB ADL and mobility, and up to Mod A with LB ADL.  OT will continue to follow in the acute setting to reinforce back precautions (recalled 1/3), hip kit usage for LB ADL and continued functional mobility in the room.  HH OT is recommended for a safe transition home, unless pain reduces significantly allowing for improved independence.      Follow Up Recommendations  Home health OT;Supervision/Assistance - 24 hour    Equipment Recommendations  3 in 1 bedside commode;Other (comment) (hip kit)    Recommendations for Other Services       Precautions / Restrictions Precautions Precautions: Back;Fall Precaution Booklet Issued: No Precaution Comments: Drain in place Required Braces or Orthoses: Spinal Brace Spinal Brace: Lumbar corset;Applied in sitting position Restrictions Weight Bearing Restrictions: No      Mobility Bed Mobility Overal bed mobility: Needs Assistance Bed Mobility: Rolling;Sidelying to Sit;Supine to Sit;Sit to Supine Rolling: Min assist Sidelying to sit: Min assist Supine to sit: Min assist Sit to supine: Mod assist        Transfers Overall transfer level: Needs  assistance Equipment used: Rolling walker (2 wheeled) Transfers: Sit to/from Stand Sit to Stand: Min assist              Balance Overall balance assessment: Needs assistance Sitting-balance support: Feet supported;Single extremity supported Sitting balance-Leahy Scale: Fair     Standing balance support: Bilateral upper extremity supported Standing balance-Leahy Scale: Poor                             ADL either performed or assessed with clinical judgement   ADL Overall ADL's : Needs assistance/impaired Eating/Feeding: Independent;Sitting   Grooming: Wash/dry hands;Wash/dry face;Set up;Sitting       Lower Body Bathing: Maximal assistance;Sitting/lateral leans Lower Body Bathing Details (indicate cue type and reason): will need LH sponge     Lower Body Dressing: Maximal assistance;Sitting/lateral leans Lower Body Dressing Details (indicate cue type and reason): hip kit reinforcement, will need LH reacher and shoe horn, sock aid Toilet Transfer: Minimal assistance Toilet Transfer Details (indicate cue type and reason): simulated bed to recliner         Functional mobility during ADLs: Min guard;Rolling walker       Vision Baseline Vision/History: Wears glasses Wears Glasses: At all times Patient Visual Report: No change from baseline       Perception     Praxis      Pertinent Vitals/Pain Pain Assessment: Faces Faces Pain Scale: Hurts whole lot Pain Location: incision site and R hip Pain Descriptors / Indicators: Grimacing;Guarding;Operative site guarding Pain Intervention(s): Monitored during session;Repositioned     Hand Dominance Right   Extremity/Trunk Assessment Upper  Extremity Assessment Upper Extremity Assessment: Overall WFL for tasks assessed   Lower Extremity Assessment Lower Extremity Assessment: Defer to PT evaluation       Communication Communication Communication: No difficulties   Cognition Arousal/Alertness:  Awake/alert Behavior During Therapy: WFL for tasks assessed/performed Overall Cognitive Status: Within Functional Limits for tasks assessed                                                      Home Living Family/patient expects to be discharged to:: Private residence Living Arrangements: Alone Available Help at Discharge: Family;Available 24 hours/day Type of Home: House Home Access: Stairs to enter [employer] of Steps: 1   Home Layout: One level     Bathroom Shower/Tub: Teacher, early years/pre: Standard     Home Equipment: Environmental consultant - 2 wheels;Cane - single point;Hand held shower head;Grab bars - tub/shower;Shower seat   Additional Comments: daughter and SO to stay at d/c      Prior Functioning/Environment Level of Independence: Independent                 OT Problem List: Decreased strength;Decreased activity tolerance;Impaired balance (sitting and/or standing);Decreased knowledge of use of DME or AE;Pain      OT Treatment/Interventions: Self-care/ADL training;Therapeutic exercise;Therapeutic activities    OT Goals(Current goals can be found in the care plan section) Acute Rehab OT Goals Patient Stated Goal: Reduce the pain and move better OT Goal Formulation: With patient Time For Goal Achievement: 12/26/19 Potential to Achieve Goals: Good ADL Goals Pt Will Perform Grooming: sitting;standing;with set-up Pt Will Perform Lower Body Bathing: sit to/from stand;with set-up Pt Will Perform Lower Body Dressing: with set-up;sit to/from stand Pt Will Transfer to Toilet: with modified independence;regular height toilet;ambulating  OT Frequency: Min 2X/week   Barriers to D/C:    Back pain       Co-evaluation              AM-PAC OT "6 Clicks" Daily Activity     Outcome Measure Help from another person eating meals?: None Help from another person taking care of personal grooming?: A Little   Help from another person  bathing (including washing, rinsing, drying)?: A Lot Help from another person to put on and taking off regular upper body clothing?: A Little Help from another person to put on and taking off regular lower body clothing?: A Lot 6 Click Score: 14   End of Session Equipment Utilized During Treatment: Rolling walker;Gait belt  Activity Tolerance: Patient limited by pain Patient left: in bed;with call bell/phone within reach;with bed alarm set  OT Visit Diagnosis: Unsteadiness on feet (R26.81);Pain Pain - part of body:  (back)                Time: 1635-1700 OT Time Calculation (min): 25 min Charges:  OT General Charges $OT Visit: 1 Visit OT Evaluation $OT Eval Moderate Complexity: 1 Mod  12/10/2019  Rich, OTR/L  Acute Rehabilitation Services  Office:  [place] 12/10/2019, 5:12 PM

## 2019-12-10 NOTE — Progress Notes (Signed)
NEUROSURGERY PROGRESS NOTE  Doing well. Complains of appropriate back soreness. No leg pain No numbness, tingling or weakness Good strength and sensation Incision CDI  Temp:  [97.1 F (36.2 C)-98.2 F (36.8 C)] 98 F (36.7 C) (10/30 0357) Pulse Rate:  [65-82] 77 (10/30 0357) Resp:  [8-18] 18 (10/30 0357) BP: (130-178)/(71-88) 133/79 (10/30 0357) SpO2:  [92 %-100 %] 96 % (10/30 0357) Weight:  [90.3 kg-92.2 kg] 90.3 kg (10/29 1811)  Plan: Please mobilize patient today. We will see how he does getting out of bed. If his pain is under control and he mobilizes well, ok to discharge this afternoon. Please call me if this is the case.   Eleonore Chiquito, NP 12/10/2019 8:15 AM

## 2019-12-10 NOTE — Evaluation (Signed)
Physical Therapy Evaluation Patient Details Name: [name] MRN: [id number] DOB: 1940-06-06 Today's Date: 12/10/2019   History of Present Illness  Patient is an 79 y/o male admitted for R LE pain with MRI positive for HNP and stenosis.  PMH positive for arhtritis, a-fib, prostat CA, CAD, DM, HTN, GERD, prior cervical and lumbar fusions.  Underwent decompressive laminectomy, facetectomy, foarminotomies bilateral L1-2, L2-3 with posterior fixation L1-3 on 12/09/2019.  Clinical Impression  Patient presents with decreased mobility due to pain, limited knowledge of precautions and general weakness.  He will benefit from skilled PT to progress and return home with family support with follow up HHPT to progress to independent as he normally lives alone.  Patient currently min A for mobility with RW.  Feel he should be safe for d/c home with daughter 24 hour assist when medically stable.    Follow Up Recommendations Home health PT;Follow surgeon's recommendation for DC plan and follow-up therapies    Equipment Recommendations  3in1 (PT)    Recommendations for Other Services       Precautions / Restrictions Precautions Precautions: Fall;Back Required Braces or Orthoses: Spinal Brace Spinal Brace: Lumbar corset;Applied in sitting position      Mobility  Bed Mobility Overal bed mobility: Needs Assistance Bed Mobility: Rolling;Sidelying to Sit Rolling: Supervision Sidelying to sit: Min assist       General bed mobility comments: cues for technique, used rail to help roll, assist to lift trunk due to too close to EOB to push up    Transfers Overall transfer level: Needs assistance Equipment used: Rolling walker (2 wheeled) Transfers: Sit to/from Stand Sit to Stand: Min assist         General transfer comment: up from EOB with cues for hand placement, stood and pt c/o dizziness so sat to check BP which was 140's/60's so stood second time to  walk  Ambulation/Gait Ambulation/Gait assistance: Min guard Gait Distance (Feet): 100 Feet Assistive device: Rolling walker (2 wheeled) Gait Pattern/deviations: Step-through pattern;Decreased stride length     General Gait Details: slower pace, and assist for safety, cues for walker management  Stairs            Wheelchair Mobility    Modified Rankin (Stroke Patients Only)       Balance Overall balance assessment: Needs assistance Sitting-balance support: Feet supported;Single extremity supported Sitting balance-Leahy Scale: Fair Sitting balance - Comments: UE support due to back pain   Standing balance support: Bilateral upper extremity supported Standing balance-Leahy Scale: Poor Standing balance comment: reliant on UE support                             Pertinent Vitals/Pain Pain Assessment: 0-10 Pain Score: 8  Pain Location: incision site Pain Descriptors / Indicators: Operative site guarding;Sore Pain Intervention(s): Monitored during session;Repositioned (reports medicated about 1 hour prior to session)    Home Living Family/patient expects to be discharged to:: Private residence Living Arrangements: Alone Available Help at Discharge: Family;Available 24 hours/day Type of Home: House Home Access: Stairs to enter   [employer] of Steps: 1 Home Layout: One level Home Equipment: Walker - 2 wheels;Cane - single point;Hand held shower head;Grab bars - tub/shower Additional Comments: daughter to stay at d/c    Prior Function Level of Independence: Independent         Comments: has a [place] Spaniel     [employer]        Extremity/Trunk  Assessment   Upper Extremity Assessment Upper Extremity Assessment: Overall WFL for tasks assessed    Lower Extremity Assessment Lower Extremity Assessment: Overall WFL for tasks assessed    Cervical / Trunk Assessment Cervical / Trunk Assessment: Other exceptions Cervical /  Trunk Exceptions: s/p spinal surgery  Communication   Communication: No difficulties  Cognition Arousal/Alertness: Awake/alert Behavior During Therapy: WFL for tasks assessed/performed Overall Cognitive Status: Within Functional Limits for tasks assessed                                        General Comments General comments (skin integrity, edema, etc.): assist to don brace, initiated precaution education    Exercises     Assessment/Plan    PT Assessment Patient needs continued PT services  PT Problem List Decreased activity tolerance;Decreased balance;Decreased knowledge of use of DME;Pain;Decreased knowledge of precautions;Decreased mobility;Decreased strength       PT Treatment Interventions DME instruction;Therapeutic activities;Gait training;Therapeutic exercise;Patient/family education;Stair training;Functional mobility training    PT Goals (Current goals can be found in the Care Plan section)  Acute Rehab PT Goals Patient Stated Goal: to return to independent PT Goal Formulation: With patient Time For Goal Achievement: 12/16/19 Potential to Achieve Goals: Good    Frequency Min 5X/week   Barriers to discharge        Co-evaluation               AM-PAC PT "6 Clicks" Mobility  Outcome Measure Help needed turning from your back to your side while in a flat bed without using bedrails?: A Little Help needed moving from lying on your back to sitting on the side of a flat bed without using bedrails?: A Little Help needed moving to and from a bed to a chair (including a wheelchair)?: A Little Help needed standing up from a chair using your arms (e.g., wheelchair or bedside chair)?: A Little Help needed to walk in hospital room?: A Little Help needed climbing 3-5 steps with a railing? : A Little 6 Click Score: 18    End of Session Equipment Utilized During Treatment: Back brace Activity Tolerance: Patient tolerated treatment well Patient left:  in chair;with call bell/phone within reach;with chair alarm set Nurse Communication: Mobility status PT Visit Diagnosis: Other abnormalities of gait and mobility (R26.89);Pain Pain - part of body:  (back)    Time: 4742-5956 PT Time Calculation (min) (ACUTE ONLY): 27 min   Charges:   PT Evaluation $PT Eval Moderate Complexity: 1 Mod PT Treatments $Gait Training: 8-22 mins        Magda Kiel, PT Acute Rehabilitation Services LOVFI:[phone] Office:[phone] 12/10/2019   Reginia Naas 12/10/2019, 12:59 PM

## 2019-12-11 LAB — GLUCOSE, CAPILLARY
Glucose-Capillary: 101 mg/dL — ABNORMAL HIGH (ref 70–99)
Glucose-Capillary: 120 mg/dL — ABNORMAL HIGH (ref 70–99)
Glucose-Capillary: 101 mg/dL — ABNORMAL HIGH (ref 70–99)
Glucose-Capillary: 103 mg/dL — ABNORMAL HIGH (ref 70–99)

## 2019-12-11 NOTE — Progress Notes (Signed)
Physical Therapy Treatment Patient Details Name: [name] MRN: [id number] DOB: 01/28/1941 Today's Date: 12/11/2019    History of Present Illness Patient is an 79 y/o male admitted for R LE pain with MRI positive for HNP and stenosis.  PMH positive for arhtritis, a-fib, prostat CA, CAD, DM, HTN, GERD, prior cervical and lumbar fusions.  Underwent decompressive laminectomy, facetectomy, foarminotomies bilateral L1-2, L2-3 with posterior fixation L1-3 on 12/09/2019.    PT Comments    Pt unable to state back precautions with education for all and handout provided. Pt able to don brace with cues for upright direction. Pt completed long hall ambulation and stairs today and will have assist at D/C. Encouraged continued mobility with nursing staff.     Follow Up Recommendations  Home health PT;Follow surgeon's recommendation for DC plan and follow-up therapies     Equipment Recommendations  3in1 (PT)    Recommendations for Other Services       Precautions / Restrictions Precautions Precautions: Back;Fall Precaution Comments: hemovac Required Braces or Orthoses: Spinal Brace Spinal Brace: Lumbar corset;Applied in sitting position Restrictions Weight Bearing Restrictions: No    Mobility  Bed Mobility Overal bed mobility: Needs Assistance Bed Mobility: Rolling;Sidelying to Sit Rolling: Supervision Sidelying to sit: Supervision       General bed mobility comments: cues for technique, used rail to help roll, bed flat  Transfers Overall transfer level: Needs assistance   Transfers: Sit to/from Stand Sit to Stand: Min assist         General transfer comment: cues for hand placement and posture with assist to rise from bed and BSC  Ambulation/Gait Ambulation/Gait assistance: Min guard Gait Distance (Feet): 600 Feet Assistive device: Rolling walker (2 wheeled) Gait Pattern/deviations: Step-through pattern;Decreased stride length   Gait velocity interpretation: 1.31  - 2.62 ft/sec, indicative of limited community ambulator General Gait Details: cues for posture and proximity to [employer] Stairs: Yes Stairs assistance: Min guard Stair Management: One rail Left;Step to pattern;Forwards Number of Stairs: 2 General stair comments: cues for sequence, good safety with use of rail   Wheelchair Mobility    Modified Rankin (Stroke Patients Only)       Balance Overall balance assessment: Mild deficits observed, not formally tested                                          Cognition Arousal/Alertness: Awake/alert Behavior During Therapy: WFL for tasks assessed/performed Overall Cognitive Status: Within Functional Limits for tasks assessed                                        Exercises      General Comments        Pertinent Vitals/Pain Pain Assessment: 0-10 Pain Score: 8  Pain Location: incision site and R hip Pain Descriptors / Indicators: Grimacing;Guarding;Operative site guarding    Home Living                      Prior Function            PT Goals (current goals can now be found in the care plan section) Progress towards PT goals: Progressing toward goals    Frequency    Min 5X/week      PT Plan Current plan  remains appropriate    Co-evaluation              AM-PAC PT "6 Clicks" Mobility   Outcome Measure  Help needed turning from your back to your side while in a flat bed without using bedrails?: A Little Help needed moving from lying on your back to sitting on the side of a flat bed without using bedrails?: A Little Help needed moving to and from a bed to a chair (including a wheelchair)?: A Little Help needed standing up from a chair using your arms (e.g., wheelchair or bedside chair)?: A Little Help needed to walk in hospital room?: A Little Help needed climbing 3-5 steps with a railing? : A Little 6 Click Score: 18    End of Session Equipment Utilized  During Treatment: Back brace;Gait belt Activity Tolerance: Patient tolerated treatment well Patient left: in chair;with call bell/phone within reach;with chair alarm set Nurse Communication: Mobility status PT Visit Diagnosis: Other abnormalities of gait and mobility (R26.89);Pain;Difficulty in walking, not elsewhere classified (R26.2)     Time: 6734-1937 PT Time Calculation (min) (ACUTE ONLY): 35 min  Charges:  $Gait Training: 8-22 mins $Therapeutic Activity: 8-22 mins                     Kayceon Oki P, PT Acute Rehabilitation Services Pager: [phone] Office: [place] 12/11/2019, 8:41 AM

## 2019-12-11 NOTE — Progress Notes (Signed)
NEUROSURGERY PROGRESS NOTE  Doing well. Complains of appropriate back pain especially when moving. No leg pain No numbness, tingling or weakness Ambulating and voiding well Good strength and sensation Incision CDI Drain put out 300 in last 24 hours.   Temp:  [97.6 F (36.4 C)-99.4 F (37.4 C)] 98 F (36.7 C) (10/31 0734) Pulse Rate:  [65-83] 71 (10/31 0734) Resp:  [18-20] 18 (10/31 0734) BP: (104-140)/(58-72) 131/72 (10/31 0734) SpO2:  [91 %-97 %] 93 % (10/31 0734)  Plan: Continue therapy today. He seems hesitant to go home right now. We will see how he does with therapy and ambulating. Continue drain for today. Dc later this afternoon if doing well?  Eleonore Chiquito, NP 12/11/2019 8:33 AM

## 2019-12-12 LAB — GLUCOSE, CAPILLARY: Glucose-Capillary: 116 mg/dL — ABNORMAL HIGH (ref 70–99)

## 2019-12-12 MED ORDER — METHOCARBAMOL 500 MG PO TABS: 500.0000 mg | ORAL_TABLET | Freq: Four times a day (QID) | ORAL | 0 refills | Status: DC

## 2019-12-12 NOTE — Plan of Care (Signed)

## 2019-12-12 NOTE — Progress Notes (Addendum)
Occupational Therapy Treatment Patient Details Name: AMRITPAL SHROPSHIRE MRN: [id number] DOB: 08-07-1940 Today's Date: 12/12/2019    History of present illness Patient is an 79 y/o male admitted for R LE pain with MRI positive for HNP and stenosis.  PMH positive for arhtritis, a-fib, prostat CA, CAD, DM, HTN, GERD, prior cervical and lumbar fusions.  Underwent decompressive laminectomy, facetectomy, foarminotomies bilateral L1-2, L2-3 with posterior fixation L1-3 on 12/09/2019.   OT comments  Pt making steady progress towards OT goals this session. Pt continues to present with pain, back precautions and decreased ability to care for self impacting pts ability to complete BADLs. Pt needs most assist to initially power up into standing needing up to [facility] to power up and  for steadying assist while pt transitions BUEs from sitting surface to RW. Pt unable to figure four currently needing MAX A for LB ADLs and up to MIN A for UB ADLs. Pt reports family to assist with LB ADLs at home as needed. Pt completed  functional mobility greater than a household distance with RW and gross supervision. Pt would continue to benefit from skilled occupational therapy while admitted and after d/c to address the below listed limitations in order to improve overall functional mobility and facilitate independence with BADL participation. DC plan remains appropriate, will follow acutely per POC.     Follow Up Recommendations  Home health OT;Supervision/Assistance - 24 hour    Equipment Recommendations  Other (comment) (hip hit)    Recommendations for Other Services      Precautions / Restrictions Precautions Precautions: Back;Fall Precaution Booklet Issued: No Precaution Comments: hemovac; pt able to state 3/3 precautions Required Braces or Orthoses: Spinal Brace Spinal Brace: Lumbar corset;Applied in sitting position Restrictions Weight Bearing Restrictions: No       Mobility Bed Mobility                General bed mobility comments: pt OOB in recliner, pt reports flat bed at home and states he will have assist from daughter and gf for bed mobility  Transfers Overall transfer level: Needs assistance Equipment used: Rolling walker (2 wheeled) Transfers: Sit to/from Stand Sit to Stand: Min assist         General transfer comment: MIN A to power up into standing needing cues for hand placement and steadying assist when transitioning hands from sitting surface to RW    Balance Overall balance assessment: Needs assistance Sitting-balance support: Feet supported;Single extremity supported Sitting balance-Leahy Scale: Fair     Standing balance support: Single extremity supported;During functional activity Standing balance-Leahy Scale: Poor Standing balance comment: at least one UE supported during ADLs                           ADL either performed or assessed with clinical judgement   ADL Overall ADL's : Needs assistance/impaired     Grooming: Wash/dry face;Standing;Supervision/safety Grooming Details (indicate cue type and reason): pt able to state compensatory methods for maintaining back precautions during grooming tasks, gross supervision with Rw while standing at sink     Lower Body Bathing: Maximal assistance;Sitting/lateral leans Lower Body Bathing Details (indicate cue type and reason): will need LH sponge; unable to figure four Upper Body Dressing : Minimal assistance;Sitting;Cueing for sequencing Upper Body Dressing Details (indicate cue type and reason): light MIN A to don brace, cues for sequencing of task and correct ortientation of brace Lower Body Dressing: Maximal assistance;Sitting/lateral leans Lower Body Dressing Details (  indicate cue type and reason): unable to figure four, reports needing reacher and assist for LB dressing from previous OT session Toilet Transfer: Minimal assistance;RW;Ambulation;BSC;Regular Glass blower/designer Details (indicate  cue type and reason): BSC over regular toilet; MIN A to power up into standing, reports 3n1 at home   Toileting - Clothing Manipulation Details (indicate cue type and reason): reinforced education on no twisting for posterior pericare   Tub/Shower Transfer Details (indicate cue type and reason): pt reports walkin shower with seat Functional mobility during ADLs: Min guard;Rolling walker General ADL Comments: pt seems to be most limited by pain. pt requires most assist to power up into standing, but once pt up pain seems to improve.     Vision       Perception     Praxis      Cognition Arousal/Alertness: Awake/alert Behavior During Therapy: WFL for tasks assessed/performed Overall Cognitive Status: Within Functional Limits for tasks assessed                                          Exercises     Shoulder Instructions       General Comments drain is intact, pt reports assist from GF and daughter who can assist at home. pt reports they plan to assist with IADLs as pt has dog at home as well. pt reprots 3n1, RW, and walkin shower with seat at home. Issued pt handout for all LB AE for bathing and dressing.     Pertinent Vitals/ Pain       Faces Pain Scale: Hurts even more Pain Location: back; incision site Pain Descriptors / Indicators: Grimacing;Guarding;Operative site guarding Pain Intervention(s): Monitored during session;Repositioned  Home Living                                          Prior Functioning/Environment              Frequency           Progress Toward Goals  OT Goals(current goals can now be found in the care plan section)  Progress towards OT goals: Progressing toward goals  Acute Rehab OT Goals Patient Stated Goal: Reduce the pain and move better OT Goal Formulation: With patient Time For Goal Achievement: 12/26/19 Potential to Achieve Goals: Good  Plan Discharge plan remains appropriate;Frequency remains  appropriate    Co-evaluation                 AM-PAC OT "6 Clicks" Daily Activity     Outcome Measure   Help from another person eating meals?: None Help from another person taking care of personal grooming?: A Little Help from another person toileting, which includes using toliet, bedpan, or urinal?: A Little Help from another person bathing (including washing, rinsing, drying)?: A Lot Help from another person to put on and taking off regular upper body clothing?: A Little Help from another person to put on and taking off regular lower body clothing?: A Lot 6 Click Score: 17    End of Session Equipment Utilized During Treatment: Rolling walker;Back brace  OT Visit Diagnosis: Unsteadiness on feet (R26.81);Pain Pain - part of body:  (back)   Activity Tolerance Patient tolerated treatment well   Patient Left in chair;with call bell/phone within reach  Nurse Communication Mobility status;Patient requests pain meds        Time: 6190-1222 OT Time Calculation (min): 24 min  Charges: OT General Charges $OT Visit: 1 Visit OT Treatments $Self Care/Home Management : 23-37 mins  Lanier Clam., COTA/L Acute Rehabilitation Services [phone] [place] 12/12/2019, 8:25 AM

## 2019-12-12 NOTE — TOC Transition Note (Signed)
Transition of Care [facility]) - CM/SW Discharge Note   Patient Details  Name: [name] MRN: [id number] Date of Birth: [birth year]  [facility] CM/SW Contact:  Pollie Friar, RN Phone Number: 12/12/2019, 11:34 AM   Clinical Narrative:    Pt is discharging home with [facility] services through [place]. Cory with [facility] accepted the referral. Pt states he has the ordered DME already.  Daughter and friend to provide assistance at home.  Daughter will provide transport home.    Final next level of care: Home w Home Health Services Barriers to Discharge: No Barriers Identified   Patient Goals and CMS Choice   CMS Medicare.gov Compare Post Acute Care list provided to:: Patient Choice offered to / list presented to : Patient, Adult Children  Discharge Placement                       Discharge Plan and Services                          HH Arranged: PT, OT [facility] Agency: [place] Date [place]: 12/12/19   Representative spoke with at [place]: [place] ([place]) Interventions     Readmission Risk Interventions No flowsheet data found.

## 2019-12-12 NOTE — Discharge Summary (Signed)
Physician Discharge Summary  Patient ID: [name] MRN: [id number] DOB/AGE: 07-24-1940 79 y.o.  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 The patient tolerated the procedure well and was taken to the recovery room and then to the floor in stable condition. The hospital course was routine. There were no complications. The wound remained clean dry and intact. Pt had appropriate back soreness. No complaints of leg pain or new N/T/W. The patient remained afebrile with stable vital signs, and tolerated a regular diet. The patient continued to increase activities, and pain was well controlled with oral pain medications.   Consults: None  Significant Diagnostic Studies:  Results for orders placed or performed during the hospital encounter of 12/09/19  Glucose, capillary  Result Value Ref Range   Glucose-Capillary 106 (H) 70 - 99 mg/dL  Glucose, capillary  Result Value Ref Range   Glucose-Capillary 184 (H) 70 - 99 mg/dL  Glucose, capillary  Result Value Ref Range   Glucose-Capillary 152 (H) 70 - 99 mg/dL   Comment 1 Notify RN    Comment 2 Document in Chart   Glucose, capillary  Result Value Ref Range   Glucose-Capillary 130 (H) 70 - 99 mg/dL   Comment 1 Notify RN    Comment 2 Document in Chart   Glucose, capillary  Result Value Ref Range   Glucose-Capillary 134 (H) 70 - 99 mg/dL  Glucose, capillary  Result Value Ref Range   Glucose-Capillary 140 (H) 70 - 99 mg/dL  Glucose, capillary  Result Value Ref Range   Glucose-Capillary 153 (H) 70 - 99 mg/dL   Comment 1 Notify RN    Comment 2 Document in Chart    Glucose, capillary  Result Value Ref Range   Glucose-Capillary 101 (H) 70 - 99 mg/dL   Comment 1 Notify RN    Comment 2 Document in Chart   Glucose, capillary  Result Value Ref Range   Glucose-Capillary 120 (H) 70 - 99 mg/dL  Glucose, capillary  Result Value Ref Range   Glucose-Capillary 101 (H) 70 - 99 mg/dL  Glucose, capillary  Result Value Ref Range   Glucose-Capillary 103 (H) 70 - 99 mg/dL    Chest 2 View  Result Date: 12/08/2019 CLINICAL DATA:  Preop laminectomy EXAM: CHEST - 2 VIEW COMPARISON:  05/25/2019 FINDINGS: Surgical hardware in the cervical spine. No focal opacity or pleural effusion. Normal cardiomediastinal silhouette. No pneumothorax. IMPRESSION: No active cardiopulmonary disease. Electronically Signed   By: Donavan Foil M.D.   On: 12/08/2019 20:31   DG Lumbar Spine 2-3 Views  Result Date: 12/09/2019 CLINICAL DATA:  Laminectomy and foraminotomy L1-2, L2-3, hardware removal L3-5 EXAM: DG C-ARM 1-60 MIN; LUMBAR SPINE - 2-3 VIEW FLUOROSCOPY TIME:  Fluoroscopy Time:  37 seconds Radiation Exposure Index (if provided by the fluoroscopic device): 19.37 mGy Number of Acquired Spot Images: 2 COMPARISON:  CT 11/21/2019, radiograph 05/24/2019 FINDINGS: Frontal and lateral fluoroscopic images of the lumbar spine depict interval posterior decompression L1-L3 with placement of bilateral transpedicular screws and posterior fusion rods without acute hardware complication. There has been interval removal of the fusion rods previously spanning the bilateral transpedicular screws L3-L5. Interbody spacers are again seen  L3-4, L4-5. No acute complication is evident. Previously seen loosening of the L5 transpedicular screw is less well visualized. Redemonstrated subsidence of the interbody spacer L4-5. Multilevel discogenic changes are similar to prior. Some mild retrolisthesis L1 on L2 and L2 on L3 is similar to comparison. IMPRESSION: Fluoroscopic guidance for interval posterior decompression  L1-L3 and posterior fusion with removal of previously seen fusion rods spanning L3-L5. No acute hardware complication. Redemonstrated subsidence of the interbody spacer at L4-5. Previously seen loosening of the L5 screw is less well visualized. Electronically Signed   By: Lovena Le M.D.   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 History of prior surgery. EXAM: LUMBAR MYELOGRAM CT LUMBAR MYELOGRAM FLUOROSCOPY TIME:  Radiation Exposure Index (as provided by the fluoroscopic device): 14.9 mGy Fluoroscopy Time:  37 seconds Number of Acquired Images:  16 PROCEDURE: After thorough discussion of risks and benefits of the procedure including bleeding, infection, injury to nerves, blood vessels, adjacent structures as well as headache and CSF leak, written and oral informed consent was obtained. Consent was obtained by Dr. Fabiola Backer. Time out form was completed. Patient was positioned prone on the fluoroscopy table. Local anesthesia was provided with 1% lidocaine without epinephrine after prepped and draped in the usual sterile fashion. Puncture was performed at L5-S1 using a 3 1/2 inch 22-gauge spinal needle via right interlaminar approach. Using a single pass through the dura, the needle was placed within the thecal sac, with return of clear CSF. 15 mL of Isovue M-200 was injected into the thecal sac, with normal opacification of the nerve roots and cauda equina consistent with free flow within the subarachnoid space. I personally performed the lumbar puncture and administered the intrathecal contrast. I also personally supervised acquisition of the myelogram images. TECHNIQUE: Contiguous axial images were obtained through the lumbar spine after the intrathecal infusion of contrast. Coronal and sagittal reconstructions were obtained of the axial image sets. COMPARISON:  MRI lumbar  spine dated June 08, 2019. FINDINGS: LUMBAR MYELOGRAM FINDINGS: Trace retrolisthesis at L1-L2 and trace fused anterolisthesis at L4-L5. No dynamic instability. Prior L3-L5 PLIF. Moderate ventral extradural defect at L1-L2. Small ventral extradural defect at L2-L3. Moderate spinal canal stenosis at L2-L3. CT LUMBAR MYELOGRAM FINDINGS: Segmentation: Standard. Alignment: Unchanged trace retrolisthesis at L1-L2 and trace fused anterolisthesis at L4-L5. Vertebrae: Prior L3-L5 PLIF. Solid interbody and posterior fusion at L3-L4. Partial interbody fusion at L4-L5 with slight subsidence of the interbody graft and mild lucency surrounding the bilateral L5 pedicle screws. No posterior element fusion at this level. No acute fracture or other focal pathologic process. Unchanged L3 superior endplate Schmorl's node. Conus medullaris and cauda equina: Conus extends to the L2 level. Conus and cauda equina appear normal. Paraspinal and other soft tissues: Punctate nonobstructive right renal calculus. Aortoiliac atherosclerotic vascular disease. Disc levels: T12-L1:  Negative. L1-L2: Unchanged mild disc bulging with superimposed small right subarticular disc extrusion migrating superiorly. Unchanged mild right lateral recess and mild bilateral neuroforaminal stenosis. No spinal canal stenosis. L2-L3: Unchanged mild disc bulging, endplate spurring, and mild bilateral facet arthropathy. Unchanged moderate spinal canal stenosis and mild left lateral recess stenosis. Unchanged mild bilateral neuroforaminal stenosis. L3-L4: Prior posterior decompression and fusion. No residual stenosis. L4-L5: Prior posterior decompression and fusion. Unchanged moderate bilateral facet arthropathy. Unchanged mild right neuroforaminal stenosis. No spinal canal or left neuroforaminal stenosis. L5-S1: Unchanged small  broad-based posterior disc protrusion and mild bilateral facet arthropathy. Unchanged mild right neuroforaminal stenosis. No spinal canal or  left neuroforaminal stenosis. IMPRESSION: 1. Prior L3-L5 PLIF. Mild subsidence of the L4-L5 interbody graft and loosening of the L5 pedicle screws with partial interbody fusion and no posterior element fusion. 2. Unchanged multilevel lumbar spondylosis as described above. 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 3. Punctate nonobstructive right nephrolithiasis. 4. Aortic Atherosclerosis (ICD10-I70.0). Electronically Signed   By: Titus Dubin M.D.   On: 11/21/2019 14:16   DG C-Arm 1-60 Min  Result Date: 12/09/2019 CLINICAL DATA:  Laminectomy and foraminotomy L1-2, L2-3, hardware removal L3-5 EXAM: DG C-ARM 1-60 MIN; LUMBAR SPINE - 2-3 VIEW FLUOROSCOPY TIME:  Fluoroscopy Time:  37 seconds Radiation Exposure Index (if provided by the fluoroscopic device): 19.37 mGy Number of Acquired Spot Images: 2 COMPARISON:  CT 11/21/2019, radiograph 05/24/2019 FINDINGS: Frontal and lateral fluoroscopic images of the lumbar spine depict interval posterior decompression L1-L3 with placement of bilateral transpedicular screws and posterior fusion rods without acute hardware complication. There has been interval removal of the fusion rods previously spanning the bilateral transpedicular screws L3-L5. Interbody spacers are again seen L3-4, L4-5. No acute complication is evident. Previously seen loosening of the L5 transpedicular screw is less well visualized. Redemonstrated subsidence of the interbody spacer L4-5. Multilevel discogenic changes are similar to prior. Some mild retrolisthesis L1 on L2 and L2 on L3 is similar to comparison. IMPRESSION: Fluoroscopic guidance for interval posterior decompression L1-L3 and posterior fusion with removal of previously seen fusion rods spanning L3-L5. No acute hardware complication. Redemonstrated subsidence of the interbody spacer at L4-5. Previously seen loosening of the L5 screw is less well visualized. Electronically Signed   By: Lovena Le M.D.   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 History of prior surgery. EXAM: LUMBAR MYELOGRAM CT LUMBAR MYELOGRAM FLUOROSCOPY TIME:  Radiation Exposure Index (as provided by the fluoroscopic device): 14.9 mGy Fluoroscopy Time:  37 seconds Number of Acquired Images:  16 PROCEDURE: After thorough discussion of risks and benefits of the procedure including bleeding, infection, injury to nerves, blood vessels, adjacent structures as well as headache and CSF leak, written and oral informed consent was obtained. Consent was obtained by Dr. Fabiola Backer. Time out form was completed. Patient was positioned prone on the fluoroscopy table. Local anesthesia was provided with 1% lidocaine without epinephrine after prepped and draped in the usual sterile fashion. Puncture was performed at L5-S1 using a 3 1/2 inch 22-gauge spinal needle via right interlaminar approach. Using a single pass through the dura, the needle was placed within the thecal sac, with return of clear CSF. 15 mL of Isovue M-200 was injected into the thecal sac, with normal opacification of the nerve roots and cauda equina consistent with free flow within the subarachnoid space. I personally performed the lumbar puncture and administered the intrathecal contrast. I also personally supervised acquisition of the myelogram images. TECHNIQUE: Contiguous axial images were obtained through the lumbar spine after the intrathecal infusion of contrast. Coronal and sagittal reconstructions were obtained of the axial image sets. COMPARISON:  MRI lumbar spine dated June 08, 2019. FINDINGS: LUMBAR MYELOGRAM FINDINGS: Trace retrolisthesis at L1-L2 and trace fused anterolisthesis at L4-L5. No dynamic instability. Prior L3-L5 PLIF. Moderate ventral extradural defect at L1-L2. Small ventral extradural defect at L2-L3. Moderate spinal canal stenosis at  L2-L3. CT LUMBAR MYELOGRAM FINDINGS: Segmentation: Standard. Alignment: Unchanged trace retrolisthesis at L1-L2 and trace fused anterolisthesis at L4-L5. Vertebrae: Prior L3-L5 PLIF. Solid interbody and posterior fusion at L3-L4. Partial interbody fusion at L4-L5 with slight subsidence of the interbody graft and mild lucency surrounding the bilateral L5 pedicle screws. No posterior element fusion at this level. No acute fracture or other focal pathologic process. Unchanged L3 superior endplate Schmorl's node. Conus medullaris and cauda equina: Conus extends to the L2 level. Conus and cauda equina appear normal. Paraspinal and other soft tissues: Punctate nonobstructive right renal calculus. Aortoiliac atherosclerotic vascular disease. Disc levels: T12-L1:  Negative. L1-L2: Unchanged mild disc bulging with superimposed small right subarticular disc extrusion migrating superiorly. Unchanged mild right lateral recess and mild bilateral neuroforaminal stenosis. No spinal canal stenosis. L2-L3: Unchanged mild disc bulging, endplate spurring, and mild bilateral facet arthropathy. Unchanged moderate spinal canal stenosis and mild left lateral recess stenosis. Unchanged mild bilateral neuroforaminal stenosis. L3-L4: Prior posterior decompression and fusion. No residual stenosis. L4-L5: Prior posterior decompression and fusion. Unchanged moderate bilateral facet arthropathy. Unchanged mild right neuroforaminal stenosis. No spinal canal or left neuroforaminal stenosis. L5-S1: Unchanged small broad-based posterior disc protrusion and mild bilateral facet arthropathy. Unchanged mild right neuroforaminal stenosis. No spinal canal or left neuroforaminal stenosis. IMPRESSION: 1. Prior L3-L5 PLIF. Mild subsidence of the L4-L5 interbody graft and loosening of the L5 pedicle screws with partial interbody fusion and no posterior element fusion. 2. Unchanged multilevel lumbar spondylosis as described above. Unchanged moderate spinal  canal stenosis at L2-L3. 3. Punctate nonobstructive right nephrolithiasis. 4. Aortic Atherosclerosis (ICD10-I70.0). Electronically Signed   By: Titus Dubin M.D.   On: 11/21/2019 14:16    Antibiotics:  Anti-infectives (From admission, onward)   Start     Dose/Rate Route Frequency Ordered Stop   12/09/19 2100  ceFAZolin (ANCEF) IVPB 2g/100 mL premix        2 g 200 mL/hr over 30 Minutes Intravenous Every 8 hours 12/09/19 1841 12/10/19 0538   12/09/19 1548  vancomycin (VANCOCIN) powder  Status:  Discontinued          As needed 12/09/19 1549 12/09/19 1623   12/09/19 1100  ceFAZolin (ANCEF) IVPB 2g/100 mL premix        2 g 200 mL/hr over 30 Minutes Intravenous On call to O.R. 12/09/19 1056 12/09/19 1350      Discharge Exam: Blood pressure 132/84, pulse 69, temperature 98.2 F (36.8 C), temperature source Oral, resp. rate 18, height 5\' 11"  (1.803 m), weight 90.3 kg, SpO2 93 %. Neurologic: Grossly normal ambulatnig and voiding well, incision cdi  Discharge Medications:   Allergies as of 12/12/2019      Reactions   Oxycontin [oxycodone] Other (See Comments)   loopy      Medication List    TAKE these medications   aspirin EC 81 MG tablet Take 1 tablet (81 mg total) by mouth daily.   atorvastatin 40 MG tablet Commonly known as: LIPITOR Take 40 mg by mouth daily.   cholecalciferol 1000 units tablet Commonly known as: VITAMIN D Take 1,000 Units by mouth daily.   Fish Oil 1200 MG Caps Take 1,200 mg by mouth daily.   gabapentin 300 MG capsule Commonly known as: NEURONTIN Take 300 mg by mouth 2 (two) times daily.   HYDROcodone-acetaminophen 10-325 MG tablet Commonly known as: NORCO Take 1 tablet by mouth daily as needed for severe pain.   lisinopril 20 MG tablet Commonly known as: ZESTRIL Take 1 tablet (20 mg  total) by mouth daily.   meloxicam 15 MG tablet Commonly known as: MOBIC Take 15 mg by mouth daily.   metFORMIN 500 MG tablet Commonly known as:  GLUCOPHAGE TAKE 1 TABLET BY MOUTH EVERY DAY WITH BREAKFAST What changed: See the new instructions.   methocarbamol 500 MG tablet Commonly known as: Robaxin Take 1 tablet (500 mg total) by mouth 4 (four) times daily.   nitroGLYCERIN 0.4 MG SL tablet Commonly known as: NITROSTAT Place 1 tablet (0.4 mg total) under the tongue every 5 (five) minutes x 3 doses as needed for chest pain.   pantoprazole 40 MG tablet Commonly known as: PROTONIX Take 1 tablet (40 mg total) by mouth daily.   PRESERVISION AREDS 2 PO Take 1 tablet by mouth in the morning and at bedtime.   Refresh Dry Eye Therapy 1-1 % Soln Generic drug: Glycerin-Polysorbate 80 Place 1 drop into both eyes 2 (two) times daily as needed (for dry eyes).   tamsulosin 0.4 MG Caps capsule Commonly known as: FLOMAX Take 0.4 mg by mouth daily after supper.            Durable Medical Equipment  (From admission, onward)         Start     Ordered   12/09/19 1842  DME Walker rolling  Once       Question:  Patient needs a walker to treat with the following condition  Answer:  S/P lumbar fusion   12/09/19 1841   12/09/19 1842  DME 3 n 1  Once        12/09/19 1841          Disposition: home   Final Dx: posterior lateral fusion and decompression L1-2, L2-3  Discharge Instructions    Call MD for:  difficulty breathing, headache or visual disturbances   Complete by: As directed    Call MD for:  hives   Complete by: As directed    Call MD for:  persistant nausea and vomiting   Complete by: As directed    Call MD for:  redness, tenderness, or signs of infection (pain, swelling, redness, odor or green/yellow discharge around incision site)   Complete by: As directed    Call MD for:  severe uncontrolled pain   Complete by: As directed    Call MD for:  temperature >100.4   Complete by: As directed    Diet - low sodium heart healthy   Complete by: As directed    Driving Restrictions   Complete by: As directed    No  driving for 2 weeks, no riding in the car for 1 week   Increase activity slowly   Complete by: As directed    Lifting restrictions   Complete by: As directed    No lifting more than 8 lbs   Remove dressing in 24 hours   Complete by: As directed          Signed: Ocie Cornfield Kaleigh Spiegelman 12/12/2019, 9:55 AM

## 2019-12-12 NOTE — Progress Notes (Signed)
  [name]  D/C'd Home per MD order.        Discharge Assessment: Vitals:   12/12/19 0740 12/12/19 1150  BP: (!) 144/82 (!) 142/75  Pulse: 74 68  Resp: 18 20  Temp: 97.8 F (36.6 C) 97.9 F (36.6 C)  SpO2: 91% 94%   Skin clean, dry and intact without evidence of skin break down, no evidence of skin tears noted. IV catheter and hemovac  Discontinued per MD order. Skin and site intact without signs and symptoms of complications - no redness or edema noted at insertion site, only slight tenderness at site. No drainage noted on the surgical  site, patient states pain is manageable at time of hemovac removal and refused pain med offered by this nurse.   D/c Instructions-Education: Discharge instructions given to patient/family with verbalized understanding. D/c education completed with patient/family including follow up instructions, medication list, d/c activities limitations if indicated, with other d/c instructions as indicated by MD - patient able to verbalize understanding, all questions fully answered. Patient instructed to return to ED, call 911, or call MD for any changes in condition.  Patient escorted via [place], and D/C home via private auto.  Dorris Carnes, RN 12/12/2019 1:21 PM

## 2019-12-12 NOTE — Progress Notes (Signed)
Physical Therapy Treatment Patient Details Name: [name] MRN: [id number] DOB: October 15, 1940 Today's Date: 12/12/2019    History of Present Illness Patient is an 79 y/o male admitted for R LE pain with MRI positive for HNP and stenosis.  PMH positive for arhtritis, a-fib, prostat CA, CAD, DM, HTN, GERD, prior cervical and lumbar fusions.  Underwent decompressive laminectomy, facetectomy, foarminotomies bilateral L1-2, L2-3 with posterior fixation L1-3 on 12/09/2019.    PT Comments    Pt is making progress towards his goals, but requires cues to remember his 3 back precautions. He is, however, able to donn his brace in sitting with supervision. During gait, he continues to require a RW as gait was attempted without an AD and with only a SPC in his R hand at times with him demonstrating increased unsteadiness, drifting towards the R, and 1 minor LOB. Pt and pt's daughter educated on safe stair negotiation and car transfers in prep for d/c home. Will continue to recommend [facility] PT and follow acutely to address his deficits mentioned below to maximize his independence and safety with all functional mobility.    Follow Up Recommendations  Home health PT;Follow surgeon's recommendation for DC plan and follow-up therapies;Supervision for mobility/OOB     Equipment Recommendations  3in1 (PT)    Recommendations for Other Services       Precautions / Restrictions Precautions Precautions: Back;Fall Precaution Booklet Issued: No Precaution Comments: hemovac; required cues to remember all 3 precautions Required Braces or Orthoses: Spinal Brace Spinal Brace: Lumbar corset;Applied in sitting position Restrictions Weight Bearing Restrictions: No    Mobility  Bed Mobility Overal bed mobility: Needs Assistance Bed Mobility: Rolling;Sidelying to Sit Rolling: Supervision Sidelying to sit: Supervision       General bed mobility comments: Bed flat, cuing pt to roll onto side maintaining hips and  shoulders together to prevent twisting. Utilized bed rail to assist with roll and ascension of trunk, with increased time.  Transfers Overall transfer level: Needs assistance Equipment used: Rolling walker (2 wheeled) Transfers: Sit to/from Stand Sit to Stand: Min guard         General transfer comment: Required several attempts at rocking and extra time to power up to stand, cuing pt on hand placement on bed rather than RW to come to stand. Cued pt on proper transfer back to sit by reaching back for surface. Educated and demonstrated pt and pt's daughter on safe car transfers.  Ambulation/Gait Ambulation/Gait assistance: Min guard Gait Distance (Feet): 175 Feet Assistive device: Rolling walker (2 wheeled) Gait Pattern/deviations: Step-through pattern;Decreased stride length;Drifts right/left Gait velocity: decreased Gait velocity interpretation: 1.31 - 2.62 ft/sec, indicative of limited community ambulator General Gait Details: Transitioned from utilizing RW to no AD, displaying increased R drift and unsteadiness. Attempted SPC in R hand with improved steadiness compared to no AD, but 1 minor LOB in which pt was able to recover with min guard. Pt reported increased confidence and felt more steady with RW and thus returned to utilizing RW.   Stairs Stairs: Yes Stairs assistance: Min guard Stair Management: One rail Left;Step to pattern Number of Stairs: 2 General stair comments: L hand rail ascending and descending with step-to gait pattern. No LOB, but pt impulsive on attempting to start stair negotiation prior to being cued.    Wheelchair Mobility    Modified Rankin (Stroke Patients Only)       Balance Overall balance assessment: Needs assistance Sitting-balance support: Feet supported;No upper extremity supported Sitting balance-Leahy Scale: Good Sitting  balance - Comments: Pt able to donn back brace in sitting with supervision without LOB.   Standing balance support:  No upper extremity supported;During functional activity Standing balance-Leahy Scale: Good Standing balance comment: Pt able to perform functional mobility without UE support, but displays unsteadiness and 1 minor LOB.                            Cognition Arousal/Alertness: Awake/alert Behavior During Therapy: WFL for tasks assessed/performed Overall Cognitive Status: Within Functional Limits for tasks assessed                                        Exercises      General Comments        Pertinent Vitals/Pain Pain Assessment: Faces Faces Pain Scale: Hurts a little bit Pain Location: back; incision site Pain Descriptors / Indicators: Grimacing;Guarding;Operative site guarding Pain Intervention(s): Limited activity within patient's tolerance;Monitored during session    Home Living                      Prior Function            PT Goals (current goals can now be found in the care plan section) Acute Rehab PT Goals Patient Stated Goal: To get better and go home PT Goal Formulation: With patient/family Time For Goal Achievement: 12/16/19 Potential to Achieve Goals: Good Progress towards PT goals: Progressing toward goals    Frequency    Min 5X/week      PT Plan Current plan remains appropriate    Co-evaluation              AM-PAC PT "6 Clicks" Mobility   Outcome Measure  Help needed turning from your back to your side while in a flat bed without using bedrails?: A Little Help needed moving from lying on your back to sitting on the side of a flat bed without using bedrails?: A Little Help needed moving to and from a bed to a chair (including a wheelchair)?: A Little Help needed standing up from a chair using your arms (e.g., wheelchair or bedside chair)?: A Little Help needed to walk in hospital room?: A Little Help needed climbing 3-5 steps with a railing? : A Little 6 Click Score: 18    End of Session Equipment  Utilized During Treatment: Back brace;Gait belt Activity Tolerance: Patient tolerated treatment well Patient left: in bed;with call bell/phone within reach;with family/visitor present;with nursing/sitter in room   PT Visit Diagnosis: Other abnormalities of gait and mobility (R26.89);Pain;Difficulty in walking, not elsewhere classified (R26.2);Unsteadiness on feet (R26.81) Pain - part of body:  (back; surgical site)     Time: 0233-4356 PT Time Calculation (min) (ACUTE ONLY): 19 min  Charges:  $Gait Training: 8-22 mins                     Moishe Spice, PT, DPT Acute Rehabilitation Services  Pager: [phone] Office: [place] 12/12/2019, 12:48 PM

## 2019-12-13 ENCOUNTER — Encounter (HOSPITAL_COMMUNITY): Payer: Self-pay | Admitting: Neurological Surgery

## 2019-12-14 ENCOUNTER — Other Ambulatory Visit: Payer: Self-pay | Admitting: Family Medicine

## 2019-12-14 ENCOUNTER — Emergency Department (HOSPITAL_COMMUNITY): Payer: Medicare PPO

## 2019-12-14 ENCOUNTER — Telehealth: Payer: Self-pay | Admitting: Family Medicine

## 2019-12-14 ENCOUNTER — Other Ambulatory Visit: Payer: Self-pay

## 2019-12-14 ENCOUNTER — Emergency Department (HOSPITAL_COMMUNITY)
Admission: EM | Admit: 2019-12-14 | Discharge: 2019-12-14 | Disposition: A | Discharge status: Home / Self Care | Payer: Medicare PPO | Attending: Emergency Medicine | Admitting: Emergency Medicine

## 2019-12-14 DIAGNOSIS — Z9049 Acquired absence of other specified parts of digestive tract: Secondary | ICD-10-CM | POA: Diagnosis not present

## 2019-12-14 DIAGNOSIS — J438 Other emphysema: Secondary | ICD-10-CM | POA: Diagnosis not present

## 2019-12-14 DIAGNOSIS — Z79899 Other long term (current) drug therapy: Secondary | ICD-10-CM | POA: Diagnosis not present

## 2019-12-14 DIAGNOSIS — R0602 Shortness of breath: Secondary | ICD-10-CM | POA: Diagnosis not present

## 2019-12-14 DIAGNOSIS — Z4789 Encounter for other orthopedic aftercare: Secondary | ICD-10-CM | POA: Diagnosis not present

## 2019-12-14 DIAGNOSIS — Z7984 Long term (current) use of oral hypoglycemic drugs: Secondary | ICD-10-CM | POA: Diagnosis not present

## 2019-12-14 DIAGNOSIS — R066 Hiccough: Secondary | ICD-10-CM | POA: Insufficient documentation

## 2019-12-14 DIAGNOSIS — I10 Essential (primary) hypertension: Secondary | ICD-10-CM | POA: Insufficient documentation

## 2019-12-14 DIAGNOSIS — Z87891 Personal history of nicotine dependence: Secondary | ICD-10-CM | POA: Diagnosis not present

## 2019-12-14 DIAGNOSIS — Z8546 Personal history of malignant neoplasm of prostate: Secondary | ICD-10-CM | POA: Insufficient documentation

## 2019-12-14 DIAGNOSIS — N281 Cyst of kidney, acquired: Secondary | ICD-10-CM | POA: Diagnosis not present

## 2019-12-14 DIAGNOSIS — L7634 Postprocedural seroma of skin and subcutaneous tissue following other procedure: Secondary | ICD-10-CM | POA: Diagnosis not present

## 2019-12-14 DIAGNOSIS — I2510 Atherosclerotic heart disease of native coronary artery without angina pectoris: Secondary | ICD-10-CM | POA: Insufficient documentation

## 2019-12-14 DIAGNOSIS — K567 Ileus, unspecified: Secondary | ICD-10-CM | POA: Diagnosis not present

## 2019-12-14 DIAGNOSIS — I700 Atherosclerosis of aorta: Secondary | ICD-10-CM | POA: Diagnosis not present

## 2019-12-14 DIAGNOSIS — M48061 Spinal stenosis, lumbar region without neurogenic claudication: Secondary | ICD-10-CM | POA: Diagnosis not present

## 2019-12-14 DIAGNOSIS — I480 Paroxysmal atrial fibrillation: Secondary | ICD-10-CM | POA: Insufficient documentation

## 2019-12-14 DIAGNOSIS — R52 Pain, unspecified: Secondary | ICD-10-CM | POA: Diagnosis not present

## 2019-12-14 DIAGNOSIS — E119 Type 2 diabetes mellitus without complications: Secondary | ICD-10-CM | POA: Insufficient documentation

## 2019-12-14 DIAGNOSIS — M549 Dorsalgia, unspecified: Secondary | ICD-10-CM | POA: Diagnosis not present

## 2019-12-14 DIAGNOSIS — K5730 Diverticulosis of large intestine without perforation or abscess without bleeding: Secondary | ICD-10-CM | POA: Diagnosis not present

## 2019-12-14 DIAGNOSIS — M5136 Other intervertebral disc degeneration, lumbar region: Secondary | ICD-10-CM | POA: Diagnosis not present

## 2019-12-14 DIAGNOSIS — M4316 Spondylolisthesis, lumbar region: Secondary | ICD-10-CM | POA: Diagnosis not present

## 2019-12-14 LAB — CBC WITH DIFFERENTIAL/PLATELET
Abs Immature Granulocytes: 0.03 10*3/uL (ref 0.00–0.07)
Basophils Absolute: 0 10*3/uL (ref 0.0–0.1)
Basophils Relative: 0 %
Eosinophils Absolute: 0.3 10*3/uL (ref 0.0–0.5)
Eosinophils Relative: 3 %
HCT: 39.3 % (ref 39.0–52.0)
Hemoglobin: 13.4 g/dL (ref 13.0–17.0)
Immature Granulocytes: 0 %
Lymphocytes Relative: 14 %
Lymphs Abs: 1.3 10*3/uL (ref 0.7–4.0)
MCH: 30.5 pg (ref 26.0–34.0)
MCHC: 34.1 g/dL (ref 30.0–36.0)
MCV: 89.5 fL (ref 80.0–100.0)
Monocytes Absolute: 0.9 10*3/uL (ref 0.1–1.0)
Monocytes Relative: 10 %
Neutro Abs: 6.5 10*3/uL (ref 1.7–7.7)
Neutrophils Relative %: 73 %
Platelets: 176 10*3/uL (ref 150–400)
RBC: 4.39 MIL/uL (ref 4.22–5.81)
RDW: 12.9 % (ref 11.5–15.5)
WBC: 9.1 10*3/uL (ref 4.0–10.5)
nRBC: 0 % (ref 0.0–0.2)

## 2019-12-14 LAB — COMPREHENSIVE METABOLIC PANEL
ALT: 17 U/L (ref 0–44)
AST: 19 U/L (ref 15–41)
Albumin: 3.6 g/dL (ref 3.5–5.0)
Alkaline Phosphatase: 59 U/L (ref 38–126)
Anion gap: 14 (ref 5–15)
BUN: 20 mg/dL (ref 8–23)
CO2: 21 mmol/L — ABNORMAL LOW (ref 22–32)
Calcium: 9.2 mg/dL (ref 8.9–10.3)
Chloride: 102 mmol/L (ref 98–111)
Creatinine, Ser: 0.77 mg/dL (ref 0.61–1.24)
GFR, Estimated: >60 mL/min (ref >60)
Glucose, Bld: 108 mg/dL — ABNORMAL HIGH (ref 70–99)
Potassium: 4.1 mmol/L (ref 3.5–5.1)
Sodium: 137 mmol/L (ref 135–145)
Total Bilirubin: 1.8 mg/dL — ABNORMAL HIGH (ref 0.3–1.2)
Total Protein: 6.7 g/dL (ref 6.5–8.1)

## 2019-12-14 LAB — D-DIMER, QUANTITATIVE: D-Dimer, Quant: 2.49 ug/mL-FEU — ABNORMAL HIGH (ref 0.00–0.50)

## 2019-12-14 LAB — TROPONIN I (HIGH SENSITIVITY)
Troponin I (High Sensitivity): 4 ng/L (ref <18)
Troponin I (High Sensitivity): 4 ng/L (ref <18)

## 2019-12-14 LAB — LIPASE, BLOOD: Lipase: 28 U/L (ref 11–51)

## 2019-12-14 MED ORDER — METOCLOPRAMIDE HCL 10 MG PO TABS: 10.0000 mg | ORAL_TABLET | Freq: Three times a day (TID) | ORAL | 0 refills | Status: DC | PRN

## 2019-12-14 MED ORDER — METOCLOPRAMIDE HCL 5 MG/ML IJ SOLN
5.0000 mg | Freq: Once | INTRAMUSCULAR | Status: AC
  Administered 2019-12-14: 5 mg via INTRAVENOUS
  Filled 2019-12-14: qty 2

## 2019-12-14 MED ORDER — METOCLOPRAMIDE HCL 5 MG/ML IJ SOLN
10.0000 mg | Freq: Once | INTRAMUSCULAR | Status: AC
  Administered 2019-12-14: 10 mg via INTRAVENOUS
  Filled 2019-12-14: qty 2

## 2019-12-14 MED ORDER — IOHEXOL 350 MG/ML SOLN
100.0000 mL | Freq: Once | INTRAVENOUS | Status: AC | PRN
  Administered 2019-12-14: 100 mL via INTRAVENOUS

## 2019-12-14 NOTE — Telephone Encounter (Signed)
Maneuvers to get rid of hiccups.  ?Breath holding for 5 to 10 seconds (or as tolerated). ?Performing Valsalva maneuver, holding for five seconds. ?Sipping on or gargling with very cold water. ?Biting into a lemon. ?Pulling on the tongue. ?Swallowing a teaspoon of dry sugar. ?Pressing gently but firmly on the eyeballs. ?While sitting, pulling the knees up to the chest (or leaning forward to compress the chest); hold the position for 30 seconds to one minute if possible.   Please see if pt is having an increase in GERD. This can cause hiccups. If so I would recommend increase pantoprazole to 40 mg one twice a day.   Pt has methocarbamol and gabapentin. Is he still taking these?  Is he on a steroid after his back surgery? Please let me know and I can make a recommendation.  May be able to give or change medicines.  Kc

## 2019-12-14 NOTE — ED Triage Notes (Signed)
[facility] w/ complaints of repeated hiccuping causing shortness of breath because the hiccups are coming close together its "like he's gasping for air" per EMS. Pt recently had back surgery at [facility] on Friday. The hiccuping started 5 days ago. Pt denies any chest pain, abdominal pain, dizziness. Pt only reports back pain 8 out of 10. NAD at this time.   176/94 HR-80 SpO2-98 CBG-119 Temp-97.9

## 2019-12-14 NOTE — ED Provider Notes (Signed)
[facility] EMERGENCY DEPARTMENT Provider Note   CSN: [id number] Arrival date & time: 12/14/19  1751     History Chief Complaint  Patient presents with  . Shortness of Breath    [name] is a 79 y.o. male.  HPI   79 year old male with a history of arthritis, atrial fibrillation, cancer, cataract, CAD, depression, diabetes, dyslipidemia, get dysrhythmia, essential hypertension, GERD, hiatal hernia, nephrolithiasis, who presents to the emergency department today for evaluation of shortness of breath.  Patient states that since he had surgery on his back 5 days ago he has had the hiccups.  Because the hiccups have been so severe he has been having shortness of breath because he feels like he cannot catch his breath secondary to the coughing.  He denies any chest pain, pleuritic pain.  He states he has had some intermittent nausea and vomiting.  He states he has been constipated and has not had a bowel movement since he had surgery 5 days ago.  He denies any abdominal pain or fevers.  States he has had pain since his surgery but denies any new numbness/weakness.  Past Medical History:  Diagnosis Date  . Arthritis    on meds  . Atrial fibrillation ([place])   . Cancer ([place]) 10/2015   prostate cancer treated with radiation  . Cataract    sx-bilaterally  . Colitis 11/22/2018  . 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 Depression 04/04/2019  . Diabetes mellitus without complication ([place])    Type II  . Diarrhea 11/21/2018  . Dyslipidemia 06/17/2016  . Dysrhythmia   . Essential hypertension 06/17/2016  . GERD (gastroesophageal reflux disease)    on meds  . Headache   . History of hiatal hernia   . History of kidney stones   . History of prostate cancer 05/20/2019  . Hypercholesteremia 04/04/2019  . Hyperlipemia   . Hypertension   . Impaired fasting blood  sugar 05/20/2019  . Malignant neoplasm prostate ([place]) 05/20/2019  . Paroxysmal atrial fibrillation ([place]) 04/04/2019  . PONV (postoperative nausea and vomiting)   . 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 S/P lumbar spinal fusion 09/29/2013  . Sepsis ([place]) 11/22/2018    Patient Active Problem List   Diagnosis Date Noted  . S/P lumbar fusion 12/09/2019  . Mixed hyperlipidemia 08/31/2019  . Angina pectoris ([place]) 08/02/2019  . Diabetes mellitus due to underlying condition with unspecified complications ([place]) 32/44/0102  . Dyspnea on exertion 06/28/2019  . Chest tightness 06/28/2019  . 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 Impaired fasting blood sugar 05/20/2019  . History of prostate cancer 05/20/2019  . Depression 04/04/2019  . Hypercholesteremia 04/04/2019  . Colitis 11/22/2018  . Sepsis ([place]) 11/22/2018  . 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 Dyslipidemia 06/17/2016  . Essential hypertension 06/17/2016  . S/P lumbar spinal fusion 09/29/2013    Past Surgical History:  Procedure Laterality Date  . anal fissures    . ANTERIOR CERVICAL DECOMP/DISCECTOMY FUSION N/A 03/04/2018   Procedure: Anterior Cervical Decompression Fusion - Cervical three-Cervical four - Cervical four-Cervical five;  Surgeon: Eustace Moore, MD;  Location: [facility];  Service: Neurosurgery;  Laterality: N/A;  . BACK SURGERY    . CARDIAC CATHETERIZATION     no PCI  . CARPAL TUNNEL RELEASE Left 03/04/2018  Procedure: Carpal Tunnel Release - left;  Surgeon: Eustace Moore, MD;  Location: [facility];  Service: Neurosurgery;  Laterality: Left;  . CHOLECYSTECTOMY    . COLONOSCOPY W/ POLYPECTOMY  08/30/2012   Colonic polyp status post polypectomy.  Pancolonic diverticulosis predominantly in the sigmoid colon. Small internal hemorrhoids. Moderate internal hemorhroids.   . ESOPHAGOGASTRODUODENOSCOPY  08/04/2016     Schatzkis ring status post esophageal dilatation. Small hiatal hernia. Mild gastritis.   [facility] EYE SURGERY     cataract removal bilateral eyes  . FOOT SURGERY     left heel  . HERNIA REPAIR    . LAMINECTOMY WITH POSTERIOR LATERAL ARTHRODESIS LEVEL 2 N/A 12/09/2019   Procedure: Laminectomy and Foraminotomy - Lumbar one-two, Lumbar two-three, posterolateral instrumented fusion Lumbar one-three, removal of instrumentation Lumbar three-five;  Surgeon: Eustace Moore, MD;  Location: [facility];  Service: Neurosurgery;  Laterality: N/A;  . LEFT HEART CATH AND CORONARY ANGIOGRAPHY N/A 08/03/2019   Procedure: LEFT HEART CATH AND CORONARY ANGIOGRAPHY;  Surgeon: Jettie Booze, MD;  Location: [facility];  Service: Cardiovascular;  Laterality: N/A;  . SHOULDER SURGERY     bilateral  . WISDOM TOOTH EXTRACTION         Family History  Problem Relation Age of Onset  . Colon cancer Neg Hx   . Esophageal cancer Neg Hx   . Rectal cancer Neg Hx   . Stomach cancer Neg Hx   . Colon polyps Neg Hx     Social History   Tobacco Use  . Smoking status: Former Smoker    Packs/day: 0.50    Years: 35.00    Pack years: 17.50    Types: Cigarettes    Quit date: 02/10/1993    Years since quitting: 26.8  . Smokeless tobacco: Former Systems developer    Types: [place] date: 02/10/2006  Vaping Use  . Vaping Use: Never used  Substance Use Topics  . Alcohol use: Yes    Alcohol/week: 1.0 standard drink    Types: 1 Cans of beer per week    Comment: occ  . Drug use: No    Home Medications Prior to Admission medications   Medication Sig Start Date End Date Taking? Authorizing Provider  aspirin EC 81 MG tablet Take 1 tablet (81 mg total) by mouth daily. 11/11/19   Cox, Elnita Maxwell, MD  atorvastatin (LIPITOR) 40 MG tablet Take 40 mg by mouth daily.    [provider]  cholecalciferol (VITAMIN D) 1000 UNITS tablet Take 1,000 Units by mouth daily.    [provider]  gabapentin (NEURONTIN) 300 MG  capsule Take 300 mg by mouth 2 (two) times daily.     [provider]  Glycerin-Polysorbate 80 (REFRESH DRY EYE THERAPY) 1-1 % SOLN Place 1 drop into both eyes 2 (two) times daily as needed (for dry eyes).     [provider]  HYDROcodone-acetaminophen (NORCO) 10-325 MG tablet Take 1 tablet by mouth daily as needed for severe pain. 11/10/19   [provider]  lisinopril (ZESTRIL) 20 MG tablet Take 1 tablet (20 mg total) by mouth daily. 08/05/19   Revankar, Reita Cliche, MD  meloxicam (MOBIC) 15 MG tablet Take 15 mg by mouth daily.    [provider]  metFORMIN (GLUCOPHAGE) 500 MG tablet TAKE 1 TABLET BY MOUTH EVERY DAY WITH BREAKFAST Patient taking differently: Take 500 mg by mouth daily with breakfast.  09/15/19   Marge Duncans, PA-C  methocarbamol (ROBAXIN) 500 MG  tablet Take 1 tablet (500 mg total) by mouth 4 (four) times daily. 12/12/19   Meyran, Ocie Cornfield, NP  Multiple Vitamins-Minerals (PRESERVISION AREDS 2 PO) Take 1 tablet by mouth in the morning and at bedtime.    [provider]  nitroGLYCERIN (NITROSTAT) 0.4 MG SL tablet Place 1 tablet (0.4 mg total) under the tongue every 5 (five) minutes x 3 doses as needed for chest pain. 06/28/19   Revankar, Reita Cliche, MD  Omega-3 Fatty Acids (FISH OIL) 1200 MG CAPS Take 1,200 mg by mouth daily.     [provider]  pantoprazole (PROTONIX) 40 MG tablet Take 1 tablet (40 mg total) by mouth daily. 10/26/19   Cox, Elnita Maxwell, MD  tamsulosin (FLOMAX) 0.4 MG CAPS capsule Take 0.4 mg by mouth daily after supper.  01/27/17   [provider]    Allergies    Oxycontin [oxycodone]  Review of Systems   Review of Systems  Constitutional: Negative for chills and fever.       Hiccups  HENT: Negative for ear pain and sore throat.   Eyes: Negative for pain and visual disturbance.  Respiratory: Negative for cough and shortness of breath.   Cardiovascular: Negative for chest pain.  Gastrointestinal: Positive  for constipation, nausea and vomiting. Negative for abdominal pain and diarrhea.  Genitourinary: Negative for dysuria and hematuria.  Musculoskeletal: Negative for arthralgias and back pain.  Skin: Negative for color change and rash.  Neurological: Negative for seizures and syncope.  All other systems reviewed and are negative.   Physical Exam Updated Vital Signs BP (!) 156/89   Pulse 90   Temp 97.6 F (36.4 C) (Oral)   Resp (!) 31   Ht 5\' 8"  (1.727 m)   Wt 93 kg   SpO2 95%   BMI 31.17 kg/m   Physical Exam Vitals and nursing note reviewed.  Constitutional:      Appearance: He is well-developed.     Comments: Hiccups noted on exam  HENT:     Head: Normocephalic and atraumatic.  Eyes:     Conjunctiva/sclera: Conjunctivae normal.  Cardiovascular:     Rate and Rhythm: Normal rate and regular rhythm.     Heart sounds: Normal heart sounds. No murmur heard.   Pulmonary:     Effort: Pulmonary effort is normal. No respiratory distress.     Breath sounds: Normal breath sounds. No decreased breath sounds, wheezing, rhonchi or rales.  Abdominal:     General: Bowel sounds are normal.     Palpations: Abdomen is soft.     Tenderness: There is no abdominal tenderness. There is no guarding or rebound.  Musculoskeletal:     Cervical back: Neck supple.  Skin:    General: Skin is warm and dry.     Comments: Steri strips in place to back with no obvious erythema or drainage of pus.   Neurological:     Mental Status: He is alert.     ED Results / Procedures / Treatments   Labs (all labs ordered are listed, but only abnormal results are displayed) Labs Reviewed  COMPREHENSIVE METABOLIC PANEL - Abnormal; Notable for the following components:      Result Value   CO2 21 (*)    Glucose, Bld 108 (*)    Total Bilirubin 1.8 (*)    All other components within normal limits  D-DIMER, QUANTITATIVE (NOT AT [facility]) - Abnormal; Notable for the following components:   D-Dimer, Quant 2.49 (*)  All other components within normal limits  CBC WITH DIFFERENTIAL/PLATELET  LIPASE, BLOOD  TROPONIN I (HIGH SENSITIVITY)  TROPONIN I (HIGH SENSITIVITY)    EKG EKG Interpretation  Date/Time:  Wednesday December 14 2019 18:14:52 EDT Ventricular Rate:  84 PR Interval:    QRS Duration: 83 QT Interval:  375 QTC Calculation: 444 R Axis:   58 Text Interpretation: Sinus rhythm Borderline low voltage, extremity leads Abnormal R-wave progression, early transition Confirmed by Madalyn Rob [phone]) on 12/14/2019 6:28:25 PM   Radiology DG Abd Acute W/Chest  Result Date: 12/14/2019 CLINICAL DATA:  Abdominal distension. Recent lumbar surgery less than a week ago. EXAM: DG ABDOMEN ACUTE WITH 1 VIEW CHEST COMPARISON:  CT 11/21/2018 FINDINGS: The lungs are clear. The heart is normal in size. There is aortic tortuosity. No pulmonary edema. No focal airspace disease or pleural effusion. No pneumothorax. No free intra-abdominal air. Slight increased air throughout prominent loops of small bowel, greatest dimension 3.2 cm. No significant air-fluid levels. Air and stool throughout the colon. Stool balls in the rectum. Cholecystectomy clips in the right upper quadrant. Previous right renal stone is not seen by radiograph. Surgical hardware at throughout the lumbar spine. L4 laminectomy. IMPRESSION: 1. Slight increased air throughout prominent loops of small bowel throughout the abdomen. Air and stool throughout the colon. Findings favor ileus. 2. No acute chest finding. Electronically Signed   By: Keith Rake M.D.   On: 12/14/2019 19:56    Procedures Procedures (including critical care time)  Medications Ordered in ED Medications  metoCLOPramide (REGLAN) injection 10 mg (10 mg Intravenous Given 12/14/19 1833)  metoCLOPramide (REGLAN) injection 5 mg (5 mg Intravenous Given 12/14/19 2058)    ED Course  I have reviewed the triage vital signs and the nursing notes.  Pertinent labs & imaging results  that were available during my care of the patient were reviewed by me and considered in my medical decision making (see chart for details).    MDM Rules/Calculators/A&P                          79 year old male presenting the emergency department today for evaluation of hiccups.  Hiccups are so severe that is making him feel short of breath.  He denies any pleuritic pain, chest pain, abdominal pain but he has had some nausea and vomiting and constipation at home.  Reviewed/interpreted labs CBC Is without leukocytosis or anemia CMP without significant electrolyte derangement.  Normal kidney and liver function.  Slightly elevated total bilirubin though have low suspicion that this is clinically relevant given new right upper quadrant tenderness. Lipase negative Trop negative Ddimer elevated   EKG inus rhythm Borderline low voltage, extremity leads Abnormal R-wave progression, early transition   Xray abd/chest reviewed/interpreted - 1. Slight increased air throughout prominent loops of small bowel throughout the abdomen. Air and stool throughout the colon. Findings favor ileus. 2. No acute chest finding.   At shift change, pt pending CTA chest and CT abd. Care transitioned to Dr. Roslynn Amble with plan to f/u on pending imaging and disposition   Final Clinical Impression(s) / ED Diagnoses Final diagnoses:  Hiccups    Rx / DC Orders ED Discharge Orders    None       Bishop Dublin 12/14/19 2132    Lucrezia Starch, MD 12/14/19 2241

## 2019-12-14 NOTE — Discharge Instructions (Signed)
Please follow-up with your primary doctor as well as your spine surgeon.  Recommend taking the previously prescribed Colace.  Initially recommend taking MiraLAX as discussed.  Can take the prescribed Reglan as needed for further nausea, hiccup episodes.  If you develop worsening difficulty breathing, chest pain or other new concerning symptom, return to ER for reassessment.

## 2019-12-15 ENCOUNTER — Other Ambulatory Visit: Payer: Self-pay | Admitting: Family Medicine

## 2019-12-15 ENCOUNTER — Encounter: Payer: Self-pay | Admitting: Family Medicine

## 2019-12-15 MED ORDER — METOCLOPRAMIDE HCL 10 MG PO TABS: 10.0000 mg | ORAL_TABLET | Freq: Four times a day (QID) | ORAL | 0 refills | Status: DC | PRN

## 2019-12-15 MED ORDER — GABAPENTIN 600 MG PO TABS: 600.0000 mg | ORAL_TABLET | Freq: Three times a day (TID) | ORAL | 0 refills | Status: DC

## 2019-12-16 DIAGNOSIS — M4316 Spondylolisthesis, lumbar region: Secondary | ICD-10-CM | POA: Diagnosis not present

## 2019-12-16 DIAGNOSIS — E119 Type 2 diabetes mellitus without complications: Secondary | ICD-10-CM | POA: Diagnosis not present

## 2019-12-16 DIAGNOSIS — I2510 Atherosclerotic heart disease of native coronary artery without angina pectoris: Secondary | ICD-10-CM | POA: Diagnosis not present

## 2019-12-16 DIAGNOSIS — M5136 Other intervertebral disc degeneration, lumbar region: Secondary | ICD-10-CM | POA: Diagnosis not present

## 2019-12-16 DIAGNOSIS — Z4789 Encounter for other orthopedic aftercare: Secondary | ICD-10-CM | POA: Diagnosis not present

## 2019-12-16 DIAGNOSIS — I700 Atherosclerosis of aorta: Secondary | ICD-10-CM | POA: Diagnosis not present

## 2019-12-16 DIAGNOSIS — I480 Paroxysmal atrial fibrillation: Secondary | ICD-10-CM | POA: Diagnosis not present

## 2019-12-16 DIAGNOSIS — I10 Essential (primary) hypertension: Secondary | ICD-10-CM | POA: Diagnosis not present

## 2019-12-16 DIAGNOSIS — M48061 Spinal stenosis, lumbar region without neurogenic claudication: Secondary | ICD-10-CM | POA: Diagnosis not present

## 2019-12-19 ENCOUNTER — Other Ambulatory Visit: Payer: Self-pay

## 2019-12-19 DIAGNOSIS — I2510 Atherosclerotic heart disease of native coronary artery without angina pectoris: Secondary | ICD-10-CM | POA: Diagnosis not present

## 2019-12-19 DIAGNOSIS — Z4789 Encounter for other orthopedic aftercare: Secondary | ICD-10-CM | POA: Diagnosis not present

## 2019-12-19 DIAGNOSIS — M48061 Spinal stenosis, lumbar region without neurogenic claudication: Secondary | ICD-10-CM | POA: Diagnosis not present

## 2019-12-19 DIAGNOSIS — M5136 Other intervertebral disc degeneration, lumbar region: Secondary | ICD-10-CM | POA: Diagnosis not present

## 2019-12-19 DIAGNOSIS — I10 Essential (primary) hypertension: Secondary | ICD-10-CM | POA: Diagnosis not present

## 2019-12-19 DIAGNOSIS — M4316 Spondylolisthesis, lumbar region: Secondary | ICD-10-CM | POA: Diagnosis not present

## 2019-12-19 DIAGNOSIS — E119 Type 2 diabetes mellitus without complications: Secondary | ICD-10-CM | POA: Diagnosis not present

## 2019-12-19 DIAGNOSIS — I480 Paroxysmal atrial fibrillation: Secondary | ICD-10-CM | POA: Diagnosis not present

## 2019-12-19 DIAGNOSIS — I700 Atherosclerosis of aorta: Secondary | ICD-10-CM | POA: Diagnosis not present

## 2019-12-19 NOTE — Patient Outreach (Signed)
[facility]) Care Management  12/19/2019  [name] 06-04-1940 [id number]   EMMI- General Discharge RED ON EMMI ALERT Day # 4 Date:  12/17/19 Red Alert Reason:  Scheduled follow up appointment? No  Outreach attempt: Spoke with patient.  He reports he is doing good.  Addressed red alert. Patient reports he has follow up with physician.  He denies any problems with transportation.  He states he is getting around with walker and can for stability.  Discussed fall precautions.  He verbalized understanding.  H estates he has home health involved and declined further nurse follow up.    Plan: RN CM will close case.    Jone Baseman, RN, MSN [facility] Care Management Care Management Coordinator Direct Line [phone] Toll Free: [phone]  Fax: [phone]

## 2019-12-21 DIAGNOSIS — I700 Atherosclerosis of aorta: Secondary | ICD-10-CM | POA: Diagnosis not present

## 2019-12-21 DIAGNOSIS — I2510 Atherosclerotic heart disease of native coronary artery without angina pectoris: Secondary | ICD-10-CM | POA: Diagnosis not present

## 2019-12-21 DIAGNOSIS — I10 Essential (primary) hypertension: Secondary | ICD-10-CM | POA: Diagnosis not present

## 2019-12-21 DIAGNOSIS — M48061 Spinal stenosis, lumbar region without neurogenic claudication: Secondary | ICD-10-CM | POA: Diagnosis not present

## 2019-12-21 DIAGNOSIS — E119 Type 2 diabetes mellitus without complications: Secondary | ICD-10-CM | POA: Diagnosis not present

## 2019-12-21 DIAGNOSIS — I480 Paroxysmal atrial fibrillation: Secondary | ICD-10-CM | POA: Diagnosis not present

## 2019-12-21 DIAGNOSIS — Z4789 Encounter for other orthopedic aftercare: Secondary | ICD-10-CM | POA: Diagnosis not present

## 2019-12-21 DIAGNOSIS — M4316 Spondylolisthesis, lumbar region: Secondary | ICD-10-CM | POA: Diagnosis not present

## 2019-12-21 DIAGNOSIS — M5136 Other intervertebral disc degeneration, lumbar region: Secondary | ICD-10-CM | POA: Diagnosis not present

## 2019-12-22 DIAGNOSIS — I2510 Atherosclerotic heart disease of native coronary artery without angina pectoris: Secondary | ICD-10-CM | POA: Diagnosis not present

## 2019-12-22 DIAGNOSIS — M4316 Spondylolisthesis, lumbar region: Secondary | ICD-10-CM | POA: Diagnosis not present

## 2019-12-22 DIAGNOSIS — Z4789 Encounter for other orthopedic aftercare: Secondary | ICD-10-CM | POA: Diagnosis not present

## 2019-12-22 DIAGNOSIS — I10 Essential (primary) hypertension: Secondary | ICD-10-CM | POA: Diagnosis not present

## 2019-12-22 DIAGNOSIS — E119 Type 2 diabetes mellitus without complications: Secondary | ICD-10-CM | POA: Diagnosis not present

## 2019-12-22 DIAGNOSIS — I700 Atherosclerosis of aorta: Secondary | ICD-10-CM | POA: Diagnosis not present

## 2019-12-22 DIAGNOSIS — M48061 Spinal stenosis, lumbar region without neurogenic claudication: Secondary | ICD-10-CM | POA: Diagnosis not present

## 2019-12-22 DIAGNOSIS — I480 Paroxysmal atrial fibrillation: Secondary | ICD-10-CM | POA: Diagnosis not present

## 2019-12-22 DIAGNOSIS — M5136 Other intervertebral disc degeneration, lumbar region: Secondary | ICD-10-CM | POA: Diagnosis not present

## 2019-12-26 DIAGNOSIS — M5136 Other intervertebral disc degeneration, lumbar region: Secondary | ICD-10-CM | POA: Diagnosis not present

## 2019-12-26 DIAGNOSIS — Z4789 Encounter for other orthopedic aftercare: Secondary | ICD-10-CM | POA: Diagnosis not present

## 2019-12-26 DIAGNOSIS — E119 Type 2 diabetes mellitus without complications: Secondary | ICD-10-CM | POA: Diagnosis not present

## 2019-12-26 DIAGNOSIS — M48061 Spinal stenosis, lumbar region without neurogenic claudication: Secondary | ICD-10-CM | POA: Diagnosis not present

## 2019-12-26 DIAGNOSIS — I10 Essential (primary) hypertension: Secondary | ICD-10-CM | POA: Diagnosis not present

## 2019-12-26 DIAGNOSIS — M4316 Spondylolisthesis, lumbar region: Secondary | ICD-10-CM | POA: Diagnosis not present

## 2019-12-26 DIAGNOSIS — I700 Atherosclerosis of aorta: Secondary | ICD-10-CM | POA: Diagnosis not present

## 2019-12-26 DIAGNOSIS — I480 Paroxysmal atrial fibrillation: Secondary | ICD-10-CM | POA: Diagnosis not present

## 2019-12-26 DIAGNOSIS — I2510 Atherosclerotic heart disease of native coronary artery without angina pectoris: Secondary | ICD-10-CM | POA: Diagnosis not present

## 2019-12-29 DIAGNOSIS — E119 Type 2 diabetes mellitus without complications: Secondary | ICD-10-CM | POA: Diagnosis not present

## 2019-12-29 DIAGNOSIS — I10 Essential (primary) hypertension: Secondary | ICD-10-CM | POA: Diagnosis not present

## 2019-12-29 DIAGNOSIS — M4316 Spondylolisthesis, lumbar region: Secondary | ICD-10-CM | POA: Diagnosis not present

## 2019-12-29 DIAGNOSIS — M48061 Spinal stenosis, lumbar region without neurogenic claudication: Secondary | ICD-10-CM | POA: Diagnosis not present

## 2019-12-29 DIAGNOSIS — Z4789 Encounter for other orthopedic aftercare: Secondary | ICD-10-CM | POA: Diagnosis not present

## 2019-12-29 DIAGNOSIS — I2510 Atherosclerotic heart disease of native coronary artery without angina pectoris: Secondary | ICD-10-CM | POA: Diagnosis not present

## 2019-12-29 DIAGNOSIS — I700 Atherosclerosis of aorta: Secondary | ICD-10-CM | POA: Diagnosis not present

## 2019-12-29 DIAGNOSIS — I480 Paroxysmal atrial fibrillation: Secondary | ICD-10-CM | POA: Diagnosis not present

## 2019-12-29 DIAGNOSIS — M5136 Other intervertebral disc degeneration, lumbar region: Secondary | ICD-10-CM | POA: Diagnosis not present

## 2019-12-30 DIAGNOSIS — Z4789 Encounter for other orthopedic aftercare: Secondary | ICD-10-CM | POA: Diagnosis not present

## 2019-12-30 DIAGNOSIS — M4316 Spondylolisthesis, lumbar region: Secondary | ICD-10-CM | POA: Diagnosis not present

## 2019-12-30 DIAGNOSIS — I480 Paroxysmal atrial fibrillation: Secondary | ICD-10-CM | POA: Diagnosis not present

## 2019-12-30 DIAGNOSIS — E119 Type 2 diabetes mellitus without complications: Secondary | ICD-10-CM | POA: Diagnosis not present

## 2019-12-30 DIAGNOSIS — I700 Atherosclerosis of aorta: Secondary | ICD-10-CM | POA: Diagnosis not present

## 2019-12-30 DIAGNOSIS — M48061 Spinal stenosis, lumbar region without neurogenic claudication: Secondary | ICD-10-CM | POA: Diagnosis not present

## 2019-12-30 DIAGNOSIS — I10 Essential (primary) hypertension: Secondary | ICD-10-CM | POA: Diagnosis not present

## 2019-12-30 DIAGNOSIS — I2510 Atherosclerotic heart disease of native coronary artery without angina pectoris: Secondary | ICD-10-CM | POA: Diagnosis not present

## 2019-12-30 DIAGNOSIS — M5136 Other intervertebral disc degeneration, lumbar region: Secondary | ICD-10-CM | POA: Diagnosis not present

## 2020-01-03 DIAGNOSIS — Z4789 Encounter for other orthopedic aftercare: Secondary | ICD-10-CM | POA: Diagnosis not present

## 2020-01-03 DIAGNOSIS — M5136 Other intervertebral disc degeneration, lumbar region: Secondary | ICD-10-CM | POA: Diagnosis not present

## 2020-01-03 DIAGNOSIS — H269 Unspecified cataract: Secondary | ICD-10-CM | POA: Insufficient documentation

## 2020-01-03 DIAGNOSIS — E119 Type 2 diabetes mellitus without complications: Secondary | ICD-10-CM | POA: Diagnosis not present

## 2020-01-03 DIAGNOSIS — M1990 Unspecified osteoarthritis, unspecified site: Secondary | ICD-10-CM | POA: Insufficient documentation

## 2020-01-03 DIAGNOSIS — Z8719 Personal history of other diseases of the digestive system: Secondary | ICD-10-CM | POA: Insufficient documentation

## 2020-01-03 DIAGNOSIS — R112 Nausea with vomiting, unspecified: Secondary | ICD-10-CM | POA: Insufficient documentation

## 2020-01-03 DIAGNOSIS — I480 Paroxysmal atrial fibrillation: Secondary | ICD-10-CM | POA: Diagnosis not present

## 2020-01-03 DIAGNOSIS — M4316 Spondylolisthesis, lumbar region: Secondary | ICD-10-CM | POA: Diagnosis not present

## 2020-01-03 DIAGNOSIS — Z87442 Personal history of urinary calculi: Secondary | ICD-10-CM | POA: Insufficient documentation

## 2020-01-03 DIAGNOSIS — I2510 Atherosclerotic heart disease of native coronary artery without angina pectoris: Secondary | ICD-10-CM | POA: Diagnosis not present

## 2020-01-03 DIAGNOSIS — I4891 Unspecified atrial fibrillation: Secondary | ICD-10-CM | POA: Insufficient documentation

## 2020-01-03 DIAGNOSIS — E785 Hyperlipidemia, unspecified: Secondary | ICD-10-CM | POA: Insufficient documentation

## 2020-01-03 DIAGNOSIS — I700 Atherosclerosis of aorta: Secondary | ICD-10-CM | POA: Diagnosis not present

## 2020-01-03 DIAGNOSIS — Z9889 Other specified postprocedural states: Secondary | ICD-10-CM | POA: Insufficient documentation

## 2020-01-03 DIAGNOSIS — K219 Gastro-esophageal reflux disease without esophagitis: Secondary | ICD-10-CM | POA: Insufficient documentation

## 2020-01-03 DIAGNOSIS — R519 Headache, unspecified: Secondary | ICD-10-CM | POA: Insufficient documentation

## 2020-01-03 DIAGNOSIS — I10 Essential (primary) hypertension: Secondary | ICD-10-CM | POA: Diagnosis not present

## 2020-01-03 DIAGNOSIS — I499 Cardiac arrhythmia, unspecified: Secondary | ICD-10-CM | POA: Insufficient documentation

## 2020-01-03 DIAGNOSIS — M48061 Spinal stenosis, lumbar region without neurogenic claudication: Secondary | ICD-10-CM | POA: Diagnosis not present

## 2020-01-04 DIAGNOSIS — M5136 Other intervertebral disc degeneration, lumbar region: Secondary | ICD-10-CM | POA: Diagnosis not present

## 2020-01-04 DIAGNOSIS — I10 Essential (primary) hypertension: Secondary | ICD-10-CM | POA: Diagnosis not present

## 2020-01-04 DIAGNOSIS — I700 Atherosclerosis of aorta: Secondary | ICD-10-CM | POA: Diagnosis not present

## 2020-01-04 DIAGNOSIS — E119 Type 2 diabetes mellitus without complications: Secondary | ICD-10-CM | POA: Diagnosis not present

## 2020-01-04 DIAGNOSIS — M4316 Spondylolisthesis, lumbar region: Secondary | ICD-10-CM | POA: Diagnosis not present

## 2020-01-04 DIAGNOSIS — I2510 Atherosclerotic heart disease of native coronary artery without angina pectoris: Secondary | ICD-10-CM | POA: Diagnosis not present

## 2020-01-04 DIAGNOSIS — M48061 Spinal stenosis, lumbar region without neurogenic claudication: Secondary | ICD-10-CM | POA: Diagnosis not present

## 2020-01-04 DIAGNOSIS — I480 Paroxysmal atrial fibrillation: Secondary | ICD-10-CM | POA: Diagnosis not present

## 2020-01-04 DIAGNOSIS — Z4789 Encounter for other orthopedic aftercare: Secondary | ICD-10-CM | POA: Diagnosis not present

## 2020-01-09 ENCOUNTER — Ambulatory Visit: Payer: Medicare PPO | Admitting: Cardiology

## 2020-01-09 ENCOUNTER — Other Ambulatory Visit: Payer: Self-pay | Admitting: Cardiology

## 2020-01-09 ENCOUNTER — Other Ambulatory Visit: Payer: Self-pay

## 2020-01-09 ENCOUNTER — Encounter: Payer: Self-pay | Admitting: Cardiology

## 2020-01-09 VITALS — BP 128/62 | HR 80 | Ht 71.0 in | Wt 193.8 lb

## 2020-01-09 DIAGNOSIS — E782 Mixed hyperlipidemia: Secondary | ICD-10-CM | POA: Diagnosis not present

## 2020-01-09 DIAGNOSIS — E7800 Pure hypercholesterolemia, unspecified: Secondary | ICD-10-CM | POA: Diagnosis not present

## 2020-01-09 DIAGNOSIS — E119 Type 2 diabetes mellitus without complications: Secondary | ICD-10-CM | POA: Diagnosis not present

## 2020-01-09 DIAGNOSIS — M4316 Spondylolisthesis, lumbar region: Secondary | ICD-10-CM | POA: Diagnosis not present

## 2020-01-09 DIAGNOSIS — M48061 Spinal stenosis, lumbar region without neurogenic claudication: Secondary | ICD-10-CM | POA: Diagnosis not present

## 2020-01-09 DIAGNOSIS — E088 Diabetes mellitus due to underlying condition with unspecified complications: Secondary | ICD-10-CM | POA: Diagnosis not present

## 2020-01-09 DIAGNOSIS — I2510 Atherosclerotic heart disease of native coronary artery without angina pectoris: Secondary | ICD-10-CM

## 2020-01-09 DIAGNOSIS — M5136 Other intervertebral disc degeneration, lumbar region: Secondary | ICD-10-CM | POA: Diagnosis not present

## 2020-01-09 DIAGNOSIS — Z4789 Encounter for other orthopedic aftercare: Secondary | ICD-10-CM | POA: Diagnosis not present

## 2020-01-09 DIAGNOSIS — I10 Essential (primary) hypertension: Secondary | ICD-10-CM | POA: Diagnosis not present

## 2020-01-09 DIAGNOSIS — I700 Atherosclerosis of aorta: Secondary | ICD-10-CM | POA: Diagnosis not present

## 2020-01-09 DIAGNOSIS — I480 Paroxysmal atrial fibrillation: Secondary | ICD-10-CM | POA: Diagnosis not present

## 2020-01-09 DIAGNOSIS — R0609 Other forms of dyspnea: Secondary | ICD-10-CM

## 2020-01-09 MED ORDER — ATORVASTATIN CALCIUM 20 MG PO TABS
20.0000 mg | ORAL_TABLET | Freq: Every day | ORAL | 3 refills | Status: DC
Start: 2020-01-09 — End: 2020-03-20

## 2020-01-09 NOTE — Progress Notes (Signed)
Cardiology Office Note:    Date:  01/09/2020   ID:  [name], DOB 1940/11/18, MRN [id number]  PCP:  Rochel Brome, MD  Cardiologist:  Jenean Lindau, MD   Referring MD: Rochel Brome, MD    ASSESSMENT:    1. Coronary artery disease involving native coronary artery of native heart without angina pectoris   2. Essential hypertension   3. Hypercholesteremia   4. Mixed hyperlipidemia   5. Diabetes mellitus due to underlying condition with unspecified complications ([place])    PLAN:    In order of problems listed above:  1. Coronary artery disease: Secondary prevention stressed with the patient.  Importance of compliance with diet medication stressed and he vocalized understanding.  I told him to ambulate to the best of his ability. 2. Essential hypertension: Blood pressure stable and diet was emphasized. 3. Mixed dyslipidemia and diabetes mellitus: Diet was emphasized.  Hemo-globin A1c was 6 and I discussed this with him at length.  Lipids were reviewed and LDL was 44 therefore I cut down his atorvastatin from 40 to 20 mg daily. 4. Weight reduction was stressed.  He has abdominal obesity.  He understands and he is going to try to do better. 5. Patient will be seen in follow-up appointment in 6 months or earlier if the patient has any concerns    Medication Adjustments/Labs and Tests Ordered: Current medicines are reviewed at length with the patient today.  Concerns regarding medicines are outlined above.  No orders of the defined types were placed in this encounter.  No orders of the defined types were placed in this encounter.    No chief complaint on file.    History of Present Illness:    [name] is a 79 y.o. male.  Patient has past medical history of essential hypertension dyslipidemia diabetes mellitus and coronary artery disease.  He denies any problems at this time and takes care of activities of daily living.  No chest pain orthopnea or PND.  His daughter  accompanies him for the visit.  He is recovering from lumbar surgery.  He went to the emergency room for shortness of breath and was evaluated.  I reviewed emergency room records extensively.  At the time of my evaluation, the patient is alert awake oriented and in no distress.  Past Medical History:  Diagnosis Date  . Angina pectoris ([place]) 08/02/2019  . Arthritis    on meds  . Atrial fibrillation ([place])   . Cancer ([place]) 10/2015   prostate cancer treated with radiation  . Cataract    sx-bilaterally  . Chest tightness 06/28/2019  . Colitis 11/22/2018  . 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 Depression 04/04/2019  . Diabetes mellitus due to underlying condition with unspecified complications ([place]) 9/32/6712  . Diabetes mellitus without complication ([place])    Type II  . Diarrhea 11/21/2018  . Dyslipidemia 06/17/2016  . Dyspnea on exertion 06/28/2019  . Dysrhythmia   . Essential hypertension 06/17/2016  . GERD (gastroesophageal reflux disease)    on meds  . Headache   . History of hiatal hernia   . History of kidney stones   . History of prostate cancer 05/20/2019  . Hypercholesteremia 04/04/2019  . Hyperlipemia   . Hypertension   . Impaired fasting blood sugar 05/20/2019  . Malignant neoplasm prostate ([place]) 05/20/2019  . Mixed hyperlipidemia 08/31/2019  . Paroxysmal atrial fibrillation ([facility])  04/04/2019  . PONV (postoperative nausea and vomiting)   . 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 S/P lumbar fusion 12/09/2019  . S/P lumbar spinal fusion 09/29/2013  . Sepsis ([place]) 11/22/2018    Past Surgical History:  Procedure Laterality Date  . anal fissures    . ANTERIOR CERVICAL DECOMP/DISCECTOMY FUSION N/A 03/04/2018   Procedure: Anterior Cervical Decompression Fusion - Cervical three-Cervical four - Cervical four-Cervical five;  Surgeon: Eustace Moore, MD;  Location: [facility];   Service: Neurosurgery;  Laterality: N/A;  . BACK SURGERY    . CARDIAC CATHETERIZATION     no PCI  . CARPAL TUNNEL RELEASE Left 03/04/2018   Procedure: Carpal Tunnel Release - left;  Surgeon: Eustace Moore, MD;  Location: [facility];  Service: Neurosurgery;  Laterality: Left;  . CHOLECYSTECTOMY    . COLONOSCOPY W/ POLYPECTOMY  08/30/2012   Colonic polyp status post polypectomy.  Pancolonic diverticulosis predominantly in the sigmoid colon. Small internal hemorrhoids. Moderate internal hemorhroids.   . ESOPHAGOGASTRODUODENOSCOPY  08/04/2016   Schatzkis ring status post esophageal dilatation. Small hiatal hernia. Mild gastritis.   Marland Kitchen EYE SURGERY     cataract removal bilateral eyes  . FOOT SURGERY     left heel  . HERNIA REPAIR    . LAMINECTOMY WITH POSTERIOR LATERAL ARTHRODESIS LEVEL 2 N/A 12/09/2019   Procedure: Laminectomy and Foraminotomy - Lumbar one-two, Lumbar two-three, posterolateral instrumented fusion Lumbar one-three, removal of instrumentation Lumbar three-five;  Surgeon: Eustace Moore, MD;  Location: [facility];  Service: Neurosurgery;  Laterality: N/A;  . LEFT HEART CATH AND CORONARY ANGIOGRAPHY N/A 08/03/2019   Procedure: LEFT HEART CATH AND CORONARY ANGIOGRAPHY;  Surgeon: Jettie Booze, MD;  Location: [facility];  Service: Cardiovascular;  Laterality: N/A;  . SHOULDER SURGERY     bilateral  . WISDOM TOOTH EXTRACTION      Current Medications: Current Meds  Medication Sig  . aspirin EC 81 MG tablet Take 1 tablet (81 mg total) by mouth daily.  Marland Kitchen atorvastatin (LIPITOR) 40 MG tablet Take 40 mg by mouth daily.  . cholecalciferol (VITAMIN D) 1000 UNITS tablet Take 1,000 Units by mouth daily.  Marland Kitchen gabapentin (NEURONTIN) 600 MG tablet Take 1 tablet (600 mg total) by mouth 3 (three) times daily.  . Glycerin-Polysorbate 80 (REFRESH DRY EYE THERAPY) 1-1 % SOLN Place 1 drop into both eyes 2 (two) times daily as needed (for dry eyes).   Marland Kitchen HYDROcodone-acetaminophen (NORCO) 10-325 MG  tablet Take 1 tablet by mouth daily as needed for severe pain.  Marland Kitchen lisinopril (ZESTRIL) 20 MG tablet Take 1 tablet (20 mg total) by mouth daily.  . meloxicam (MOBIC) 15 MG tablet Take 15 mg by mouth daily.  . metFORMIN (GLUCOPHAGE) 500 MG tablet TAKE 1 TABLET BY MOUTH EVERY DAY WITH BREAKFAST  . methocarbamol (ROBAXIN) 500 MG tablet Take 1 tablet (500 mg total) by mouth 4 (four) times daily.  . metoCLOPramide (REGLAN) 10 MG tablet Take 1 tablet (10 mg total) by mouth every 6 (six) hours as needed for nausea, vomiting or refractory nausea / vomiting (hiccups).  . Multiple Vitamins-Minerals (PRESERVISION AREDS 2 PO) Take 1 tablet by mouth in the morning and at bedtime.  . nitroGLYCERIN (NITROSTAT) 0.4 MG SL tablet Place 1 tablet (0.4 mg total) under the tongue every 5 (five) minutes x 3 doses as needed for chest pain.  . Omega-3 Fatty Acids (FISH OIL) 1200 MG CAPS Take 1,200 mg by mouth daily.   . pantoprazole (PROTONIX)  40 MG tablet Take 1 tablet (40 mg total) by mouth daily.  . tamsulosin (FLOMAX) 0.4 MG CAPS capsule Take 0.4 mg by mouth daily after supper.    Current Facility-Administered Medications for the 01/09/20 encounter (Office Visit) with Marlei Glomski, Reita Cliche, MD  Medication  . 0.9 %  sodium chloride infusion     Allergies:   Oxycontin [oxycodone]   Social History   Socioeconomic History  . Marital status: Widowed    Spouse name: Not on file  . Number of children: 2  . Years of education: Not on file  . Highest education level: Not on file  Occupational History  . Not on file  Tobacco Use  . Smoking status: Former Smoker    Packs/day: 0.50    Years: 35.00    Pack years: 17.50    Types: Cigarettes    Quit date: 02/10/1993    Years since quitting: 26.9  . Smokeless tobacco: Former Systems developer    Types: [place] date: 02/10/2006  Vaping Use  . Vaping Use: Never used  Substance and Sexual Activity  . Alcohol use: Yes    Alcohol/week: 1.0 standard drink    Types: 1 Cans of beer  per week    Comment: occ  . Drug use: No  . Sexual activity: Yes    Partners: Female  Other Topics Concern  . Not on file  Social History Narrative  . Not on file   Social Determinants of Health   Financial Resource Strain:   . Difficulty of Paying Living Expenses: Not on file  Food Insecurity:   . Worried About Charity fundraiser in the Last Year: Not on file  . Ran Out of Food in the Last Year: Not on file  Transportation Needs:   . Lack of Transportation (Medical): Not on file  . Lack of Transportation (Non-Medical): Not on file  Physical Activity:   . Days of Exercise per Week: Not on file  . Minutes of Exercise per Session: Not on file  Stress:   . Feeling of Stress : Not on file  Social Connections:   . Frequency of Communication with Friends and Family: Not on file  . Frequency of Social Gatherings with Friends and Family: Not on file  . Attends Religious Services: Not on file  . Active Member of Clubs or Organizations: Not on file  . Attends Archivist Meetings: Not on file  . Marital Status: Not on file     Family History: The patient's family history is negative for Colon cancer, Esophageal cancer, Rectal cancer, Stomach cancer, and Colon polyps.  ROS:   Please see the history of present illness.    All other systems reviewed and are negative.  EKGs/Labs/Other Studies Reviewed:    The following studies were reviewed today: LEFT HEART CATH AND CORONARY ANGIOGRAPHY  Conclusion    Prox Cx lesion is 30% stenosed.  Prox LAD to Mid LAD lesion is 40% stenosed.  Mid LAD lesion is 25% stenosed.  The left ventricular systolic function is normal.  LV end diastolic pressure is normal.  The left ventricular ejection fraction is 55-65% by visual estimate.  There is no aortic valve stenosis.   Nonobstructive CAD.  Continue preventive therapy.   Results conveyed to daughter.    Recent Labs: 08/31/2019: TSH 3.000 12/14/2019: ALT 17; BUN 20;  Creatinine, Ser 0.77; Hemoglobin 13.4; Platelets 176; Potassium 4.1; Sodium 137  Recent Lipid Panel    Component Value  Date/Time   CHOL 106 12/02/2019 0830   TRIG 100 12/02/2019 0830   HDL 43 12/02/2019 0830   CHOLHDL 2.5 12/02/2019 0830   CHOLHDL 2.6 01/24/2017 0521   VLDL 15 01/24/2017 0521   LDLCALC 44 12/02/2019 0830    Physical Exam:    VS:  BP 128/62   Pulse 80   Ht 5\' 11"  (1.803 m)   Wt 193 lb 12.8 oz (87.9 kg)   SpO2 98%   BMI 27.03 kg/m     Wt Readings from Last 3 Encounters:  01/09/20 193 lb 12.8 oz (87.9 kg)  12/14/19 205 lb 0.4 oz (93 kg)  12/09/19 199 lb 1.2 oz (90.3 kg)     GEN: Patient is in no acute distress HEENT: Normal NECK: No JVD; No carotid bruits LYMPHATICS: No lymphadenopathy CARDIAC: Hear sounds regular, 2/6 systolic murmur at the apex. RESPIRATORY:  Clear to auscultation without rales, wheezing or rhonchi  ABDOMEN: Soft, non-tender, non-distended MUSCULOSKELETAL:  No edema; No deformity  SKIN: Warm and dry NEUROLOGIC:  Alert and oriented x 3 PSYCHIATRIC:  Normal affect   Signed, Jenean Lindau, MD  01/09/2020 8:20 AM    [place]

## 2020-01-09 NOTE — Patient Instructions (Signed)
No aneurysmal dilatation or dissection is noted on his recent CTA.  Medication Instructions:  Your physician has recommended you make the following change in your medication:   Decrease your Atorvastatin (Lipitor) to 20 mg daily.  *If you need a refill on your cardiac medications before your next appointment, please call your pharmacy*   Lab Work: None ordered If you have labs (blood work) drawn today and your tests are completely normal, you will receive your results only by: Marland Kitchen MyChart Message (if you have MyChart) OR . A paper copy in the mail If you have any lab test that is abnormal or we need to change your treatment, we will call you to review the results.   Testing/Procedures: None ordered   Follow-Up: At [facility], you and your health needs are our priority.  As part of our continuing mission to provide you with exceptional heart care, we have created designated Provider Care Teams.  These Care Teams include your primary Cardiologist (physician) and Advanced Practice Providers (APPs -  Physician Assistants and Nurse Practitioners) who all work together to provide you with the care you need, when you need it.  We recommend signing up for the patient portal called "MyChart".  Sign up information is provided on this After Visit Summary.  MyChart is used to connect with patients for Virtual Visits (Telemedicine).  Patients are able to view lab/test results, encounter notes, upcoming appointments, etc.  Non-urgent messages can be sent to your provider as well.   To learn more about what you can do with MyChart, go to [email or web address].    Your next appointment:   6 month(s)  The format for your next appointment:   In Person  Provider:   Jyl Heinz, MD   Other Instructions NA

## 2020-01-12 DIAGNOSIS — I480 Paroxysmal atrial fibrillation: Secondary | ICD-10-CM | POA: Diagnosis not present

## 2020-01-12 DIAGNOSIS — M48061 Spinal stenosis, lumbar region without neurogenic claudication: Secondary | ICD-10-CM | POA: Diagnosis not present

## 2020-01-12 DIAGNOSIS — E119 Type 2 diabetes mellitus without complications: Secondary | ICD-10-CM | POA: Diagnosis not present

## 2020-01-12 DIAGNOSIS — I2510 Atherosclerotic heart disease of native coronary artery without angina pectoris: Secondary | ICD-10-CM | POA: Diagnosis not present

## 2020-01-12 DIAGNOSIS — I10 Essential (primary) hypertension: Secondary | ICD-10-CM | POA: Diagnosis not present

## 2020-01-12 DIAGNOSIS — M5136 Other intervertebral disc degeneration, lumbar region: Secondary | ICD-10-CM | POA: Diagnosis not present

## 2020-01-12 DIAGNOSIS — I700 Atherosclerosis of aorta: Secondary | ICD-10-CM | POA: Diagnosis not present

## 2020-01-12 DIAGNOSIS — M4316 Spondylolisthesis, lumbar region: Secondary | ICD-10-CM | POA: Diagnosis not present

## 2020-01-12 DIAGNOSIS — Z4789 Encounter for other orthopedic aftercare: Secondary | ICD-10-CM | POA: Diagnosis not present

## 2020-01-13 DIAGNOSIS — I10 Essential (primary) hypertension: Secondary | ICD-10-CM | POA: Diagnosis not present

## 2020-01-13 DIAGNOSIS — I2510 Atherosclerotic heart disease of native coronary artery without angina pectoris: Secondary | ICD-10-CM | POA: Diagnosis not present

## 2020-01-13 DIAGNOSIS — M48061 Spinal stenosis, lumbar region without neurogenic claudication: Secondary | ICD-10-CM | POA: Diagnosis not present

## 2020-01-13 DIAGNOSIS — M5136 Other intervertebral disc degeneration, lumbar region: Secondary | ICD-10-CM | POA: Diagnosis not present

## 2020-01-13 DIAGNOSIS — I700 Atherosclerosis of aorta: Secondary | ICD-10-CM | POA: Diagnosis not present

## 2020-01-13 DIAGNOSIS — M4316 Spondylolisthesis, lumbar region: Secondary | ICD-10-CM | POA: Diagnosis not present

## 2020-01-13 DIAGNOSIS — E119 Type 2 diabetes mellitus without complications: Secondary | ICD-10-CM | POA: Diagnosis not present

## 2020-01-13 DIAGNOSIS — I480 Paroxysmal atrial fibrillation: Secondary | ICD-10-CM | POA: Diagnosis not present

## 2020-01-13 DIAGNOSIS — Z4789 Encounter for other orthopedic aftercare: Secondary | ICD-10-CM | POA: Diagnosis not present

## 2020-01-14 ENCOUNTER — Other Ambulatory Visit: Payer: Self-pay | Admitting: Physician Assistant

## 2020-01-16 ENCOUNTER — Encounter: Payer: Self-pay | Admitting: Family Medicine

## 2020-01-16 ENCOUNTER — Other Ambulatory Visit: Payer: Self-pay

## 2020-01-16 ENCOUNTER — Ambulatory Visit (INDEPENDENT_AMBULATORY_CARE_PROVIDER_SITE_OTHER): Payer: Medicare PPO | Admitting: Family Medicine

## 2020-01-16 VITALS — BP 128/74 | HR 70 | Temp 97.5°F | Resp 18 | Wt 199.0 lb

## 2020-01-16 DIAGNOSIS — Z0000 Encounter for general adult medical examination without abnormal findings: Secondary | ICD-10-CM | POA: Insufficient documentation

## 2020-01-16 HISTORY — DX: Encounter for general adult medical examination without abnormal findings: Z00.00

## 2020-01-16 NOTE — Progress Notes (Signed)
Subjective:   [name] is a 79 y.o. male who presents for Medicare Annual/Subsequent preventive examination.      Objective:    Today's Vitals   01/16/20 0927  BP: 128/74  Pulse: 70  Resp: 18  Temp: (!) 97.5 F (36.4 C)  SpO2: 99%  Weight: 199 lb (90.3 kg)  PainSc: 5    Body mass index is 27.75 kg/m.  Advanced Directives 01/16/2020 12/14/2019 12/06/2019 02/25/2018 08/16/2017 01/23/2017 09/21/2013  Does Patient Have a Medical Advance Directive? No No Yes Yes No Yes Patient has advance directive, copy in chart  Type of Advance Directive - - Healthcare Power of [place];Living will - Living will [place];Living will  Does patient want to make changes to medical advance directive? - No - Patient declined No - Patient declined No - Patient declined - No - Patient declined -  Copy of [place] in Chart? - - No - copy requested No - copy requested - - -  Would patient like information on creating a medical advance directive? - No - Patient declined - - No - Patient declined - -  Pre-existing out of facility DNR order (yellow form or pink MOST form) - - - - - - -    Current Medications (verified) Outpatient Encounter Medications as of 01/16/2020  Medication Sig  . aspirin EC 81 MG tablet Take 1 tablet (81 mg total) by mouth daily.  Marland Kitchen atorvastatin (LIPITOR) 20 MG tablet Take 1 tablet (20 mg total) by mouth daily.  . cholecalciferol (VITAMIN D) 1000 UNITS tablet Take 1,000 Units by mouth daily.  Marland Kitchen gabapentin (NEURONTIN) 600 MG tablet Take 1 tablet (600 mg total) by mouth 3 (three) times daily.  . Glycerin-Polysorbate 80 (REFRESH DRY EYE THERAPY) 1-1 % SOLN Place 1 drop into both eyes 2 (two) times daily as needed (for dry eyes).   Marland Kitchen HYDROcodone-acetaminophen (NORCO) 10-325 MG tablet Take 1 tablet by mouth daily as needed for severe pain.  Marland Kitchen lisinopril (ZESTRIL) 20 MG tablet Take 1 tablet (20 mg total) by mouth daily.   . meloxicam (MOBIC) 15 MG tablet Take 15 mg by mouth daily.  . metFORMIN (GLUCOPHAGE) 500 MG tablet TAKE 1 TABLET BY MOUTH EVERY DAY WITH BREAKFAST  . methocarbamol (ROBAXIN) 500 MG tablet Take 1 tablet (500 mg total) by mouth 4 (four) times daily.  . metoCLOPramide (REGLAN) 10 MG tablet Take 1 tablet (10 mg total) by mouth every 6 (six) hours as needed for nausea, vomiting or refractory nausea / vomiting (hiccups).  . Multiple Vitamins-Minerals (PRESERVISION AREDS 2 PO) Take 1 tablet by mouth in the morning and at bedtime.  . nitroGLYCERIN (NITROSTAT) 0.4 MG SL tablet Place 1 tablet (0.4 mg total) under the tongue every 5 (five) minutes x 3 doses as needed for chest pain.  . Omega-3 Fatty Acids (FISH OIL) 1200 MG CAPS Take 1,200 mg by mouth daily.   . pantoprazole (PROTONIX) 40 MG tablet Take 1 tablet (40 mg total) by mouth daily.  . tamsulosin (FLOMAX) 0.4 MG CAPS capsule Take 0.4 mg by mouth daily after supper.    Facility-Administered Encounter Medications as of 01/16/2020  Medication  . 0.9 %  sodium chloride infusion    Allergies (verified) Oxycontin [oxycodone]   History: Past Medical History:  Diagnosis Date  . Angina pectoris ([place]) 08/02/2019  . Arthritis    on meds  . Atrial fibrillation ([place])   . Cancer ([place]) 10/2015  prostate cancer treated with radiation  . Cataract    sx-bilaterally  . Chest tightness 06/28/2019  . Colitis 11/22/2018  . 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 Depression 04/04/2019  . Diabetes mellitus due to underlying condition with unspecified complications ([place]) 09/04/3662  . Diabetes mellitus without complication ([place])    Type II  . Diarrhea 11/21/2018  . Dyslipidemia 06/17/2016  . Dyspnea on exertion 06/28/2019  . Dysrhythmia   . Essential hypertension 06/17/2016  . GERD (gastroesophageal reflux disease)    on meds  . Headache   .  History of hiatal hernia   . History of kidney stones   . History of prostate cancer 05/20/2019  . Hypercholesteremia 04/04/2019  . Hyperlipemia   . Hypertension   . Impaired fasting blood sugar 05/20/2019  . Malignant neoplasm prostate ([place]) 05/20/2019  . Mixed hyperlipidemia 08/31/2019  . Paroxysmal atrial fibrillation ([place]) 04/04/2019  . PONV (postoperative nausea and vomiting)   . 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 S/P lumbar fusion 12/09/2019  . S/P lumbar spinal fusion 09/29/2013  . Sepsis ([place]) 11/22/2018   Past Surgical History:  Procedure Laterality Date  . anal fissures    . ANTERIOR CERVICAL DECOMP/DISCECTOMY FUSION N/A 03/04/2018   Procedure: Anterior Cervical Decompression Fusion - Cervical three-Cervical four - Cervical four-Cervical five;  Surgeon: Eustace Moore, MD;  Location: [facility];  Service: Neurosurgery;  Laterality: N/A;  . BACK SURGERY    . CARDIAC CATHETERIZATION     no PCI  . CARPAL TUNNEL RELEASE Left 03/04/2018   Procedure: Carpal Tunnel Release - left;  Surgeon: Eustace Moore, MD;  Location: [facility];  Service: Neurosurgery;  Laterality: Left;  . CHOLECYSTECTOMY    . COLONOSCOPY W/ POLYPECTOMY  08/30/2012   Colonic polyp status post polypectomy.  Pancolonic diverticulosis predominantly in the sigmoid colon. Small internal hemorrhoids. Moderate internal hemorhroids.   . ESOPHAGOGASTRODUODENOSCOPY  08/04/2016   Schatzkis ring status post esophageal dilatation. Small hiatal hernia. Mild gastritis.   Marland Kitchen EYE SURGERY     cataract removal bilateral eyes  . FOOT SURGERY     left heel  . HERNIA REPAIR    . LAMINECTOMY WITH POSTERIOR LATERAL ARTHRODESIS LEVEL 2 N/A 12/09/2019   Procedure: Laminectomy and Foraminotomy - Lumbar one-two, Lumbar two-three, posterolateral instrumented fusion Lumbar one-three, removal of instrumentation Lumbar three-five;  Surgeon: Eustace Moore, MD;  Location: [facility];  Service: Neurosurgery;  Laterality: N/A;  . LEFT HEART CATH AND CORONARY  ANGIOGRAPHY N/A 08/03/2019   Procedure: LEFT HEART CATH AND CORONARY ANGIOGRAPHY;  Surgeon: Jettie Booze, MD;  Location: [facility];  Service: Cardiovascular;  Laterality: N/A;  . SHOULDER SURGERY     bilateral  . WISDOM TOOTH EXTRACTION     Family History  Problem Relation Age of Onset  . Colon cancer Neg Hx   . Esophageal cancer Neg Hx   . Rectal cancer Neg Hx   . Stomach cancer Neg Hx   . Colon polyps Neg Hx    Social History   Socioeconomic History  . Marital status: Widowed    Spouse name: Not on file  . Number of children: 2  . Years of education: Not on file  . Highest education level: Not on file  Occupational History  . Not on file  Tobacco Use  . Smoking status: Former Smoker    Packs/day: 0.50  Years: 35.00    Pack years: 17.50    Types: Cigarettes    Quit date: 02/10/1993    Years since quitting: 26.9  . Smokeless tobacco: Former Systems developer    Types: [place] date: 02/10/2006  Vaping Use  . Vaping Use: Never used  Substance and Sexual Activity  . Alcohol use: Yes    Alcohol/week: 1.0 standard drink    Types: 1 Cans of beer per week    Comment: occ  . Drug use: No  . Sexual activity: Yes    Partners: Female  Other Topics Concern  . Not on file  Social History Narrative  . Not on file   Social Determinants of Health   Financial Resource Strain:   . Difficulty of Paying Living Expenses: Not on file  Food Insecurity:   . Worried About Charity fundraiser in the Last Year: Not on file  . Ran Out of Food in the Last Year: Not on file  Transportation Needs:   . Lack of Transportation (Medical): Not on file  . Lack of Transportation (Non-Medical): Not on file  Physical Activity:   . Days of Exercise per Week: Not on file  . Minutes of Exercise per Session: Not on file  Stress:   . Feeling of Stress : Not on file  Social Connections:   . Frequency of Communication with Friends and Family: Not on file  . Frequency of Social Gatherings  with Friends and Family: Not on file  . Attends Religious Services: Not on file  . Active Member of Clubs or Organizations: Not on file  . Attends Archivist Meetings: Not on file  . Marital Status: Not on file   Clinical Intake:  Pre-visit preparation completed: Yes  Pain : 0-10 Pain Score: 5  Pain Location: Back Pain Descriptors / Indicators: Dull Recent surgery    Nutritional Risks: None  How often do you need to have someone help you when you read instructions, pamphlets, or other written materials from your doctor or pharmacy?: 5 - Always   Interpreter Needed?: No      Activities of Daily Living In your present state of health, do you have any difficulty performing the following activities: 01/16/2020 12/06/2019  Hearing? Tempie Donning  Vision? Y N  Difficulty concentrating or making decisions? Y N  Walking or climbing stairs? Y Y  Dressing or bathing? N N  Doing errands, shopping? Y N  Preparing Food and eating ? N -  Using the Toilet? N -  In the past six months, have you accidently leaked urine? N -  Do you have problems with loss of bowel control? N -  Managing your Medications? N -  Managing your Finances? N -  Housekeeping or managing your Housekeeping? Y -  Some recent data might be hidden    Patient Care Team: Rochel Brome, MD as PCP - General (Family Medicine)    Assessment:   This is a routine wellness examination for [place].  Hearing/Vision screen  Hearing Screening   125Hz  250Hz  500Hz  1000Hz  2000Hz  3000Hz  4000Hz  6000Hz  8000Hz   Right ear:           Left ear:             Visual Acuity Screening   Right eye Left eye Both eyes  Without correction:     With correction: 20/32 20/32 20/32     Dietary issues and exercise activities discussed: Current Exercise Habits: Home exercise routine, Type  of exercise: walking, Time (Minutes): 30, Frequency (Times/Week): 5, Weekly Exercise (Minutes/Week): 150, Intensity: Mild  GOALS: continue therapy as  recommended post back surgery Depression Screen PHQ 2/9 Scores 01/16/2020 08/31/2019  PHQ - 2 Score 0 0    Fall Risk Fall Risk  01/16/2020  Falls in the past year? 1  Number falls in past yr: 1  Injury with Fall? 0    FALL RISK PREVENTION PERTAINING TO THE HOME:  Any stairs in or around the home? yes If so, are there any without handrails? yes Home free of loose throw rugs in walkways, pet beds, electrical cords, etc?small dog Adequate lighting in your home to reduce risk of falls? yes  ASSISTIVE DEVICES UTILIZED TO PREVENT FALLS:  Life alert?no Use of a cane, walker or w/c?  Cane since back surgery Grab bars in the bathroom? no Shower chair or bench in shower? no Elevated toilet seat or a handicapped toilet? no    Pt using cane to walk and rise from seated position  Cognitive Function:     6CIT Screen 01/16/2020  What Year? 0 points  What month? 0 points  What time? 0 points  Count back from 20 0 points  Months in reverse 0 points  Repeat phrase 0 points  Total Score 0    Immunizations Immunization History  Administered Date(s) Administered  . Fluad Quad(high Dose 65+) 12/02/2019  . Influenza-Unspecified 12/11/2017, 11/19/2018  . Moderna SARS-COVID-2 Vaccination 03/21/2019, 04/18/2019   Screening Tests Health Maintenance  Topic Date Due  . OPHTHALMOLOGY EXAM  Never done  . TETANUS/TDAP  Never done  . PNA vac Low Risk Adult (1 of 2 - PCV13) Never done  . HEMOGLOBIN A1C  06/01/2020  . FOOT EXAM  12/01/2020  . INFLUENZA VACCINE  Completed  . COVID-19 Vaccine  Completed    Health Maintenance  Health Maintenance Due  Topic Date Due  . OPHTHALMOLOGY EXAM  Never done  . TETANUS/TDAP  Never done  . PNA vac Low Risk Adult (1 of 2 - PCV13) Never done   Vision Screening: Recommended annual ophthalmology exams for early detection of glaucoma and other disorders of the eye. Is the patient up to date with their annual eye exam?  yes-cataract surgery-uses reading  glasses Dental Screening: Recommended annual dental exams for proper oral hygiene   Plan:     I have personally reviewed and noted the following in the patient's chart:   . Medical and social history . Use of alcohol, tobacco or illicit drugs  . Current medications and supplements . Functional ability and status . Nutritional status . Physical activity . Advanced directives . List of other physicians . Hospitalizations, surgeries, and ER visits in previous 12 months . Vitals . Screenings to include cognitive, depression, and falls . Referrals and appointments  In addition, I have reviewed and discussed with patient certain preventive protocols, quality metrics, and best practice recommendations. A written personalized care plan for preventive services as well as general preventive health recommendations were provided to patient.     Mertha Baars, MD   01/16/2020

## 2020-01-16 NOTE — Patient Instructions (Signed)
  Mr. [name] , Thank you for taking time to come for your Medicare Wellness Visit. I appreciate your ongoing commitment to your health goals. Please review the following plan we discussed and let me know if I can assist you in the future.   These are the goals we discussed: Continue therapy as recommended post back surgery  This is a list of the screening recommended for you and due dates:  Health Maintenance  Topic Date Due  . Eye exam for diabetics  Never done  . Tetanus Vaccine  Never done  . Pneumonia vaccines (1 of 2 - PCV13) Never done  . Hemoglobin A1C  06/01/2020  . Complete foot exam   12/01/2020  . Flu Shot  Completed  . COVID-19 Vaccine  Completed

## 2020-01-17 DIAGNOSIS — M5136 Other intervertebral disc degeneration, lumbar region: Secondary | ICD-10-CM | POA: Diagnosis not present

## 2020-01-17 DIAGNOSIS — M48061 Spinal stenosis, lumbar region without neurogenic claudication: Secondary | ICD-10-CM | POA: Diagnosis not present

## 2020-01-17 DIAGNOSIS — Z4789 Encounter for other orthopedic aftercare: Secondary | ICD-10-CM | POA: Diagnosis not present

## 2020-01-17 DIAGNOSIS — I10 Essential (primary) hypertension: Secondary | ICD-10-CM | POA: Diagnosis not present

## 2020-01-17 DIAGNOSIS — I2510 Atherosclerotic heart disease of native coronary artery without angina pectoris: Secondary | ICD-10-CM | POA: Diagnosis not present

## 2020-01-17 DIAGNOSIS — E119 Type 2 diabetes mellitus without complications: Secondary | ICD-10-CM | POA: Diagnosis not present

## 2020-01-17 DIAGNOSIS — I480 Paroxysmal atrial fibrillation: Secondary | ICD-10-CM | POA: Diagnosis not present

## 2020-01-17 DIAGNOSIS — M4316 Spondylolisthesis, lumbar region: Secondary | ICD-10-CM | POA: Diagnosis not present

## 2020-01-17 DIAGNOSIS — I700 Atherosclerosis of aorta: Secondary | ICD-10-CM | POA: Diagnosis not present

## 2020-01-19 ENCOUNTER — Other Ambulatory Visit: Payer: Self-pay | Admitting: Family Medicine

## 2020-01-19 ENCOUNTER — Other Ambulatory Visit: Payer: Self-pay | Admitting: Cardiology

## 2020-01-19 DIAGNOSIS — I480 Paroxysmal atrial fibrillation: Secondary | ICD-10-CM | POA: Diagnosis not present

## 2020-01-19 DIAGNOSIS — M48061 Spinal stenosis, lumbar region without neurogenic claudication: Secondary | ICD-10-CM | POA: Diagnosis not present

## 2020-01-19 DIAGNOSIS — I10 Essential (primary) hypertension: Secondary | ICD-10-CM | POA: Diagnosis not present

## 2020-01-19 DIAGNOSIS — I700 Atherosclerosis of aorta: Secondary | ICD-10-CM | POA: Diagnosis not present

## 2020-01-19 DIAGNOSIS — I2510 Atherosclerotic heart disease of native coronary artery without angina pectoris: Secondary | ICD-10-CM | POA: Diagnosis not present

## 2020-01-19 DIAGNOSIS — E119 Type 2 diabetes mellitus without complications: Secondary | ICD-10-CM | POA: Diagnosis not present

## 2020-01-19 DIAGNOSIS — M5136 Other intervertebral disc degeneration, lumbar region: Secondary | ICD-10-CM | POA: Diagnosis not present

## 2020-01-19 DIAGNOSIS — M4316 Spondylolisthesis, lumbar region: Secondary | ICD-10-CM | POA: Diagnosis not present

## 2020-01-19 DIAGNOSIS — M5416 Radiculopathy, lumbar region: Secondary | ICD-10-CM | POA: Diagnosis not present

## 2020-01-19 DIAGNOSIS — Z4789 Encounter for other orthopedic aftercare: Secondary | ICD-10-CM | POA: Diagnosis not present

## 2020-01-20 ENCOUNTER — Other Ambulatory Visit: Payer: Self-pay | Admitting: Family Medicine

## 2020-01-20 NOTE — Telephone Encounter (Signed)
Please call pt and clarify dose of lipitor he is supposed to be on.  20 or 40 mg daily. Cardiology sent 20 mg 2 weeks ago and I received a request for 40 mg  Kc

## 2020-02-15 ENCOUNTER — Other Ambulatory Visit: Payer: Self-pay | Admitting: Cardiology

## 2020-02-25 DIAGNOSIS — Z20828 Contact with and (suspected) exposure to other viral communicable diseases: Secondary | ICD-10-CM | POA: Diagnosis not present

## 2020-02-25 DIAGNOSIS — J0100 Acute maxillary sinusitis, unspecified: Secondary | ICD-10-CM | POA: Diagnosis not present

## 2020-02-25 DIAGNOSIS — R059 Cough, unspecified: Secondary | ICD-10-CM | POA: Diagnosis not present

## 2020-02-25 DIAGNOSIS — J209 Acute bronchitis, unspecified: Secondary | ICD-10-CM | POA: Diagnosis not present

## 2020-02-26 ENCOUNTER — Other Ambulatory Visit: Payer: Self-pay | Admitting: Family Medicine

## 2020-03-15 ENCOUNTER — Telehealth (INDEPENDENT_AMBULATORY_CARE_PROVIDER_SITE_OTHER): Payer: Medicare PPO | Admitting: Family Medicine

## 2020-03-15 ENCOUNTER — Encounter: Payer: Self-pay | Admitting: Family Medicine

## 2020-03-15 VITALS — BP 160/76 | HR 72 | Temp 97.0°F | Resp 18 | Wt 200.0 lb

## 2020-03-15 DIAGNOSIS — J189 Pneumonia, unspecified organism: Secondary | ICD-10-CM

## 2020-03-15 DIAGNOSIS — I471 Supraventricular tachycardia: Secondary | ICD-10-CM | POA: Diagnosis not present

## 2020-03-15 DIAGNOSIS — U071 COVID-19: Secondary | ICD-10-CM

## 2020-03-15 DIAGNOSIS — R059 Cough, unspecified: Secondary | ICD-10-CM | POA: Diagnosis not present

## 2020-03-15 DIAGNOSIS — J208 Acute bronchitis due to other specified organisms: Secondary | ICD-10-CM | POA: Diagnosis not present

## 2020-03-15 DIAGNOSIS — R0602 Shortness of breath: Secondary | ICD-10-CM | POA: Diagnosis not present

## 2020-03-15 MED ORDER — AMOXICILLIN-POT CLAVULANATE 875-125 MG PO TABS: 1.0000 | ORAL_TABLET | Freq: Two times a day (BID) | ORAL | 0 refills | Status: DC

## 2020-03-15 MED ORDER — BENZONATATE 200 MG PO CAPS: 200.0000 mg | ORAL_CAPSULE | Freq: Three times a day (TID) | ORAL | 0 refills | Status: DC | PRN

## 2020-03-15 MED ORDER — TRIAMCINOLONE ACETONIDE 40 MG/ML IJ SUSP
80.0000 mg | Freq: Once | INTRAMUSCULAR | Status: AC
  Administered 2020-03-15: 80 mg via INTRAMUSCULAR

## 2020-03-15 MED ORDER — CEFTRIAXONE SODIUM 1 G IJ SOLR
1.0000 g | Freq: Once | INTRAMUSCULAR | Status: AC
  Administered 2020-03-15: 1 g via INTRAMUSCULAR

## 2020-03-15 MED ORDER — PREDNISONE 10 MG PO TABS: ORAL_TABLET | ORAL | 0 refills | Status: DC

## 2020-03-15 NOTE — Patient Instructions (Signed)
Kenalog 80 mg shot given. Rocephin 1 gm shot given.  Sent augmentin 875 mg one twice a day and prednisone pack.  Checking labs today.  Go to hospital for chest xray.

## 2020-03-15 NOTE — Progress Notes (Signed)
Virtual Visit via Telephone Note  Started virtual and then came in to be seen in the office.  Date:  03/15/2020   ID:  [name], DOB 01/24/1941, MRN [id number]  Evaluation Performed: acute visit  Chief Complaint:  cough  History of Present Illness:    [name] is a 80 y.o. male with URI x 2 weeks, typically a dry cough. Was seen at Urgent Care 3 weeks ago and was given a shot and zpack. Has been taking otc cough medication and cough drops. Covid 19 positive. Quarantined for 5 days. Had covid vaccinations x 2. Resolution of symptoms include sore throat, fever, and headaches have occurred. Had chest pain last week with or without coughing. Has not happened in the last 3 days.   BP at home is 162-175/80s, pulse 76-115.  The patient does have symptoms concerning for COVID-19 infection (fever, chills, cough, or new shortness of breath).    Past Medical History:  Diagnosis Date  . Angina pectoris ([place]) 08/02/2019  . Arthritis    on meds  . Atrial fibrillation ([place])   . Cancer ([place]) 10/2015   prostate cancer treated with radiation  . Cataract    sx-bilaterally  . Chest tightness 06/28/2019  . Colitis 11/22/2018  . 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 Depression 04/04/2019  . Diabetes mellitus due to underlying condition with unspecified complications ([place]) 10/09/5619  . Diabetes mellitus without complication ([place])    Type II  . Diarrhea 11/21/2018  . Dyslipidemia 06/17/2016  . Dyspnea on exertion 06/28/2019  . Dysrhythmia   . Essential hypertension 06/17/2016  . GERD (gastroesophageal reflux disease)    on meds  . Headache   . History of hiatal hernia   . History of kidney stones   . History of prostate cancer 05/20/2019  . Hypercholesteremia 04/04/2019  . Hyperlipemia   . Hypertension   . Impaired fasting blood sugar 05/20/2019  . Malignant neoplasm  prostate ([place]) 05/20/2019  . Mixed hyperlipidemia 08/31/2019  . Paroxysmal atrial fibrillation ([place]) 04/04/2019  . PONV (postoperative nausea and vomiting)   . 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 S/P lumbar fusion 12/09/2019  . S/P lumbar spinal fusion 09/29/2013  . Sepsis ([place]) 11/22/2018    Past Surgical History:  Procedure Laterality Date  . anal fissures    . ANTERIOR CERVICAL DECOMP/DISCECTOMY FUSION N/A 03/04/2018   Procedure: Anterior Cervical Decompression Fusion - Cervical three-Cervical four - Cervical four-Cervical five;  Surgeon: Eustace Moore, MD;  Location: [facility];  Service: Neurosurgery;  Laterality: N/A;  . BACK SURGERY    . CARDIAC CATHETERIZATION     no PCI  . CARPAL TUNNEL RELEASE Left 03/04/2018   Procedure: Carpal Tunnel Release - left;  Surgeon: Eustace Moore, MD;  Location: [facility];  Service: Neurosurgery;  Laterality: Left;  . CHOLECYSTECTOMY    . COLONOSCOPY W/ POLYPECTOMY  08/30/2012   Colonic polyp status post polypectomy.  Pancolonic diverticulosis predominantly in the sigmoid colon. Small internal hemorrhoids. Moderate internal hemorhroids.   . ESOPHAGOGASTRODUODENOSCOPY  08/04/2016   Schatzkis ring status post esophageal dilatation. Small hiatal hernia. Mild gastritis.   Marland Kitchen EYE SURGERY     cataract removal bilateral eyes  . FOOT SURGERY     left heel  . HERNIA REPAIR    . LAMINECTOMY WITH POSTERIOR LATERAL ARTHRODESIS LEVEL 2 N/A 12/09/2019   Procedure: Laminectomy and Foraminotomy -  Lumbar one-two, Lumbar two-three, posterolateral instrumented fusion Lumbar one-three, removal of instrumentation Lumbar three-five;  Surgeon: Tia Alert, MD;  Location: [facility] OR;  Service: Neurosurgery;  Laterality: N/A;  . LEFT HEART CATH AND CORONARY ANGIOGRAPHY N/A 08/03/2019   Procedure: LEFT HEART CATH AND CORONARY ANGIOGRAPHY;  Surgeon: Corky Crafts, MD;  Location: [facility] INVASIVE CV LAB;  Service: Cardiovascular;  Laterality: N/A;  . SHOULDER SURGERY     bilateral   . WISDOM TOOTH EXTRACTION      Family History  Problem Relation Age of Onset  . Colon cancer Neg Hx   . Esophageal cancer Neg Hx   . Rectal cancer Neg Hx   . Stomach cancer Neg Hx   . Colon polyps Neg Hx     Social History   Socioeconomic History  . Marital status: Widowed    Spouse name: Not on file  . Number of children: 2  . Years of education: Not on file  . Highest education level: Not on file  Occupational History  . Not on file  Tobacco Use  . Smoking status: Former Smoker    Packs/day: 0.50    Years: 35.00    Pack years: 17.50    Types: Cigarettes    Quit date: 02/10/1993    Years since quitting: 27.1  . Smokeless tobacco: Former Neurosurgeon    Types: Chew    Quit date: 02/10/2006  Vaping Use  . Vaping Use: Never used  Substance and Sexual Activity  . Alcohol use: Yes    Alcohol/week: 1.0 standard drink    Types: 1 Cans of beer per week    Comment: occ  . Drug use: No  . Sexual activity: Yes    Partners: Female  Other Topics Concern  . Not on file  Social History Narrative  . Not on file   Social Determinants of Health   Financial Resource Strain: Not on file  Food Insecurity: Not on file  Transportation Needs: Not on file  Physical Activity: Not on file  Stress: Not on file  Social Connections: Not on file  Intimate Partner Violence: Not on file    Outpatient Medications Prior to Visit  Medication Sig Dispense Refill  . aspirin EC 81 MG tablet Take 1 tablet (81 mg total) by mouth daily. 30 tablet 11  . atorvastatin (LIPITOR) 20 MG tablet Take 1 tablet (20 mg total) by mouth daily. 90 tablet 3  . atorvastatin (LIPITOR) 40 MG tablet Take 1 tablet (40 mg total) by mouth daily. 90 tablet 1  . cholecalciferol (VITAMIN D) 1000 UNITS tablet Take 1,000 Units by mouth daily.    Marland Kitchen gabapentin (NEURONTIN) 600 MG tablet TAKE 1 TABLET BY MOUTH THREE TIMES A DAY 270 tablet 1  . Glycerin-Polysorbate 80 (REFRESH DRY EYE THERAPY) 1-1 % SOLN Place 1 drop into both eyes  2 (two) times daily as needed (for dry eyes).     Marland Kitchen HYDROcodone-acetaminophen (NORCO) 10-325 MG tablet Take 1 tablet by mouth daily as needed for severe pain.    Marland Kitchen lisinopril (ZESTRIL) 20 MG tablet Take 1 tablet (20 mg total) by mouth daily. 90 tablet 3  . meloxicam (MOBIC) 15 MG tablet Take 15 mg by mouth daily.    . metFORMIN (GLUCOPHAGE) 500 MG tablet TAKE 1 TABLET BY MOUTH EVERY DAY WITH BREAKFAST 90 tablet 0  . methocarbamol (ROBAXIN) 500 MG tablet Take 1 tablet (500 mg total) by mouth 4 (four) times daily. 45 tablet 0  . metoCLOPramide (REGLAN)  10 MG tablet Take 1 tablet (10 mg total) by mouth every 6 (six) hours as needed for nausea, vomiting or refractory nausea / vomiting (hiccups). 40 tablet 0  . Multiple Vitamins-Minerals (PRESERVISION AREDS 2 PO) Take 1 tablet by mouth in the morning and at bedtime.    . nitroGLYCERIN (NITROSTAT) 0.4 MG SL tablet Place 1 tablet (0.4 mg total) under the tongue every 5 (five) minutes x 3 doses as needed for chest pain. 25 tablet 12  . Omega-3 Fatty Acids (FISH OIL) 1200 MG CAPS Take 1,200 mg by mouth daily.     . pantoprazole (PROTONIX) 40 MG tablet Take 1 tablet (40 mg total) by mouth daily. 90 tablet 1  . tamsulosin (FLOMAX) 0.4 MG CAPS capsule Take 0.4 mg by mouth daily after supper.   1   Facility-Administered Medications Prior to Visit  Medication Dose Route Frequency Provider Last Rate Last Admin  . 0.9 %  sodium chloride infusion  500 mL Intravenous Once Jackquline Denmark, MD        Allergies:   Oxycontin [oxycodone]   Social History   Tobacco Use  . Smoking status: Former Smoker    Packs/day: 0.50    Years: 35.00    Pack years: 17.50    Types: Cigarettes    Quit date: 02/10/1993    Years since quitting: 27.1  . Smokeless tobacco: Former Systems developer    Types: [place] date: 02/10/2006  Vaping Use  . Vaping Use: Never used  Substance Use Topics  . Alcohol use: Yes    Alcohol/week: 1.0 standard drink    Types: 1 Cans of beer per week     Comment: occ  . Drug use: No     Review of Systems  Constitutional: Negative for chills and fever.  HENT: Negative for congestion and sore throat.   Respiratory: Positive for cough and shortness of breath.   Cardiovascular: Positive for chest pain (Last week).  Gastrointestinal: Negative for constipation, diarrhea, nausea and vomiting.  Musculoskeletal: Negative for myalgias.  Neurological: Positive for headaches. Negative for dizziness.     Labs/Other Tests and Data Reviewed:    Recent Labs: 08/31/2019: TSH 3.000 12/14/2019: ALT 17; BUN 20; Creatinine, Ser 0.77; Hemoglobin 13.4; Platelets 176; Potassium 4.1; Sodium 137   Recent Lipid Panel Lab Results  Component Value Date/Time   CHOL 106 12/02/2019 08:30 AM   TRIG 100 12/02/2019 08:30 AM   HDL 43 12/02/2019 08:30 AM   CHOLHDL 2.5 12/02/2019 08:30 AM   CHOLHDL 2.6 01/24/2017 05:21 AM   LDLCALC 44 12/02/2019 08:30 AM    Wt Readings from Last 3 Encounters:  03/15/20 200 lb (90.7 kg)  01/16/20 199 lb (90.3 kg)  01/09/20 193 lb 12.8 oz (87.9 kg)     Objective:    Vital Signs:  BP (!) 160/76   Pulse 72   Temp (!) 97 F (36.1 C)   Resp 18   Wt 200 lb (90.7 kg)   BMI 27.89 kg/m    Physical Exam Constitutional:      Appearance: Normal appearance.  HENT:     Right Ear: Tympanic membrane, ear canal and external ear normal.     Left Ear: Tympanic membrane, ear canal and external ear normal.     Nose: Nose normal. No congestion or rhinorrhea.     Mouth/Throat:     Mouth: Mucous membranes are moist.     Pharynx: No oropharyngeal exudate or posterior oropharyngeal erythema.  Cardiovascular:  Rate and Rhythm: Normal rate and regular rhythm.     Heart sounds: Normal heart sounds.  Pulmonary:     Effort: Pulmonary effort is normal. No respiratory distress.     Breath sounds: Normal breath sounds. No wheezing, rhonchi or rales.  Abdominal:     General: Bowel sounds are normal.     Palpations: Abdomen is soft.      Tenderness: There is no abdominal tenderness.  Lymphadenopathy:     Cervical: No cervical adenopathy.  Neurological:     Mental Status: He is alert.  Psychiatric:        Mood and Affect: Mood normal.        Behavior: Behavior normal.      ASSESSMENT & PLAN:   1. Paroxysmal supraventricular tachycardia (HCC) - EKG 12-Lead: NSR no st changes.  2. SOB (shortness of breath) - DG Chest 2 View - CBC with Differential/Platelet - D-dimer, quantitative (not at [facility])  3. Community acquired pneumonia of left lower lobe of lung - triamcinolone acetonide (KENALOG-40) injection 80 mg - cefTRIAXone (ROCEPHIN) injection 1 g - amoxicillin-clavulanate (AUGMENTIN) 875-125 MG tablet; Take 1 tablet by mouth 2 (two) times daily.  Dispense: 20 tablet; Refill: 0  4. Acute bronchitis due to 2019-nCoV - predniSONE (DELTASONE) 10 MG tablet; Take 6 tablets (60 mg total) by mouth daily with breakfast for 1 day, THEN 5 tablets (50 mg total) daily with breakfast for 1 day, THEN 4 tablets (40 mg total) daily with breakfast for 1 day, THEN 3 tablets (30 mg total) daily with breakfast for 1 day, THEN 2 tablets (20 mg total) daily with breakfast for 1 day, THEN 1 tablet (10 mg total) daily with breakfast for 1 day.  Dispense: 21 tablet; Refill: 0    Orders Placed This Encounter  Procedures  . DG Chest 2 View  . CBC with Differential/Platelet  . D-dimer, quantitative (not at [facility])  . EKG 12-Lead     Meds ordered this encounter  Medications  . predniSONE (DELTASONE) 10 MG tablet    Sig: Take 6 tablets (60 mg total) by mouth daily with breakfast for 1 day, THEN 5 tablets (50 mg total) daily with breakfast for 1 day, THEN 4 tablets (40 mg total) daily with breakfast for 1 day, THEN 3 tablets (30 mg total) daily with breakfast for 1 day, THEN 2 tablets (20 mg total) daily with breakfast for 1 day, THEN 1 tablet (10 mg total) daily with breakfast for 1 day.    Dispense:  21 tablet    Refill:  0  . triamcinolone  acetonide (KENALOG-40) injection 80 mg  . cefTRIAXone (ROCEPHIN) injection 1 g  . amoxicillin-clavulanate (AUGMENTIN) 875-125 MG tablet    Sig: Take 1 tablet by mouth 2 (two) times daily.    Dispense:  20 tablet    Refill:  0    Follow Up:  In Person prn  Signed,  Rochel Brome, MD  03/15/2020 11:47 AM    [place]

## 2020-03-16 ENCOUNTER — Other Ambulatory Visit: Payer: Self-pay | Admitting: Family Medicine

## 2020-03-16 DIAGNOSIS — R059 Cough, unspecified: Secondary | ICD-10-CM | POA: Diagnosis not present

## 2020-03-16 DIAGNOSIS — R0602 Shortness of breath: Secondary | ICD-10-CM

## 2020-03-16 LAB — CBC WITH DIFFERENTIAL/PLATELET
Basophils Absolute: 0 10*3/uL (ref 0.0–0.2)
Basos: 0 %
EOS (ABSOLUTE): 0.1 10*3/uL (ref 0.0–0.4)
Eos: 2 %
Hematocrit: 48 % (ref 37.5–51.0)
Hemoglobin: 16.1 g/dL (ref 13.0–17.7)
Immature Grans (Abs): 0 10*3/uL (ref 0.0–0.1)
Immature Granulocytes: 0 %
Lymphocytes Absolute: 1.5 10*3/uL (ref 0.7–3.1)
Lymphs: 23 %
MCH: 29.5 pg (ref 26.6–33.0)
MCHC: 33.5 g/dL (ref 31.5–35.7)
MCV: 88 fL (ref 79–97)
Monocytes Absolute: 0.8 10*3/uL (ref 0.1–0.9)
Monocytes: 12 %
Neutrophils Absolute: 4 10*3/uL (ref 1.4–7.0)
Neutrophils: 63 %
Platelets: 207 10*3/uL (ref 150–450)
RBC: 5.45 x10E6/uL (ref 4.14–5.80)
RDW: 13.3 % (ref 11.6–15.4)
WBC: 6.4 10*3/uL (ref 3.4–10.8)

## 2020-03-16 LAB — D-DIMER, QUANTITATIVE

## 2020-03-16 NOTE — Progress Notes (Unsigned)
c 

## 2020-03-19 NOTE — Progress Notes (Signed)
Subjective:  Patient ID: [name], male    DOB: 17-Mar-1940  Age: 80 y.o. MRN: [id number]  Chief Complaint  Patient presents with  . Shortness of Breath  . Pneumonia    HPI Patient presents for a 5 day follow up for pneumonia. Improved coughing and sob today. Illness began approximately 3-4 weeks. He was diagnosed with covid 19 and completed his quarantine. He had a steroid shot and zpack given by urgent care and had been using otc cough medication and cough drops. Had covid vaccinations x 2. Patient was seen 5 days ago and kenalog injection and rocephin shot were given. Pt was prescribed augmentin and prednisone taper. CTA of chest was done as his ddimer was positive and cta showed no Pulmonary embolism, emphysema present, and aortic atherosclerosis present. . BP had been high at home, but has improved.    Current Outpatient Medications on File Prior to Visit  Medication Sig Dispense Refill  . aspirin EC 81 MG tablet Take 1 tablet (81 mg total) by mouth daily. 30 tablet 11  . atorvastatin (LIPITOR) 40 MG tablet Take 1 tablet (40 mg total) by mouth daily. 90 tablet 1  . benzonatate (TESSALON) 200 MG capsule Take 1 capsule (200 mg total) by mouth 3 (three) times daily as needed for cough. 30 capsule 0  . cholecalciferol (VITAMIN D) 1000 UNITS tablet Take 1,000 Units by mouth daily.    Marland Kitchen gabapentin (NEURONTIN) 600 MG tablet TAKE 1 TABLET BY MOUTH THREE TIMES A DAY 270 tablet 1  . Glycerin-Polysorbate 80 1-1 % SOLN Place 1 drop into both eyes 2 (two) times daily as needed (for dry eyes).     Marland Kitchen HYDROcodone-acetaminophen (NORCO) 10-325 MG tablet Take 1 tablet by mouth daily as needed for severe pain.    Marland Kitchen lisinopril (ZESTRIL) 20 MG tablet Take 1 tablet (20 mg total) by mouth daily. 90 tablet 3  . meloxicam (MOBIC) 15 MG tablet Take 15 mg by mouth daily.    . metFORMIN (GLUCOPHAGE) 500 MG tablet TAKE 1 TABLET BY MOUTH EVERY DAY WITH BREAKFAST 90 tablet 0  . metoCLOPramide (REGLAN) 10 MG  tablet Take 1 tablet (10 mg total) by mouth every 6 (six) hours as needed for nausea, vomiting or refractory nausea / vomiting (hiccups). 40 tablet 0  . Multiple Vitamins-Minerals (PRESERVISION AREDS 2 PO) Take 1 tablet by mouth in the morning and at bedtime.    . nitroGLYCERIN (NITROSTAT) 0.4 MG SL tablet Place 1 tablet (0.4 mg total) under the tongue every 5 (five) minutes x 3 doses as needed for chest pain. 25 tablet 12  . Omega-3 Fatty Acids (FISH OIL) 1200 MG CAPS Take 1,200 mg by mouth daily.     . pantoprazole (PROTONIX) 40 MG tablet Take 1 tablet (40 mg total) by mouth daily. 90 tablet 1  . tamsulosin (FLOMAX) 0.4 MG CAPS capsule Take 0.4 mg by mouth daily after supper.   1   Current Facility-Administered Medications on File Prior to Visit  Medication Dose Route Frequency Provider Last Rate Last Admin  . 0.9 %  sodium chloride infusion  500 mL Intravenous Once Jackquline Denmark, MD       Past Medical History:  Diagnosis Date  . Angina pectoris ([place]) 08/02/2019  . Arthritis    on meds  . Atrial fibrillation ([place])   . Cancer ([place]) 10/2015   prostate cancer treated with radiation  . Cataract    sx-bilaterally  . Chest tightness 06/28/2019  . Colitis  11/22/2018  . 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 Depression 04/04/2019  . Diabetes mellitus due to underlying condition with unspecified complications ([place]) 1/44/3154  . Diabetes mellitus without complication ([place])    Type II  . Diarrhea 11/21/2018  . Dyslipidemia 06/17/2016  . Dyspnea on exertion 06/28/2019  . Dysrhythmia   . Essential hypertension 06/17/2016  . GERD (gastroesophageal reflux disease)    on meds  . Headache   . History of hiatal hernia   . History of kidney stones   . History of prostate cancer 05/20/2019  . Hypercholesteremia 04/04/2019  . Hyperlipemia   . Hypertension   . Impaired fasting blood sugar  05/20/2019  . Malignant neoplasm prostate ([place]) 05/20/2019  . Mixed hyperlipidemia 08/31/2019  . Paroxysmal atrial fibrillation ([place]) 04/04/2019  . PONV (postoperative nausea and vomiting)   . 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 S/P lumbar fusion 12/09/2019  . S/P lumbar spinal fusion 09/29/2013  . Sepsis ([place]) 11/22/2018   Past Surgical History:  Procedure Laterality Date  . anal fissures    . ANTERIOR CERVICAL DECOMP/DISCECTOMY FUSION N/A 03/04/2018   Procedure: Anterior Cervical Decompression Fusion - Cervical three-Cervical four - Cervical four-Cervical five;  Surgeon: Eustace Moore, MD;  Location: [facility];  Service: Neurosurgery;  Laterality: N/A;  . BACK SURGERY    . CARDIAC CATHETERIZATION     no PCI  . CARPAL TUNNEL RELEASE Left 03/04/2018   Procedure: Carpal Tunnel Release - left;  Surgeon: Eustace Moore, MD;  Location: [place];  Service: Neurosurgery;  Laterality: Left;  . CHOLECYSTECTOMY    . COLONOSCOPY W/ POLYPECTOMY  08/30/2012   Colonic polyp status post polypectomy.  Pancolonic diverticulosis predominantly in the sigmoid colon. Small internal hemorrhoids. Moderate internal hemorhroids.   . ESOPHAGOGASTRODUODENOSCOPY  08/04/2016   Schatzkis ring status post esophageal dilatation. Small hiatal hernia. Mild gastritis.   Marland Kitchen EYE SURGERY     cataract removal bilateral eyes  . FOOT SURGERY     left heel  . HERNIA REPAIR    . LAMINECTOMY WITH POSTERIOR LATERAL ARTHRODESIS LEVEL 2 N/A 12/09/2019   Procedure: Laminectomy and Foraminotomy - Lumbar one-two, Lumbar two-three, posterolateral instrumented fusion Lumbar one-three, removal of instrumentation Lumbar three-five;  Surgeon: Eustace Moore, MD;  Location: [facility];  Service: Neurosurgery;  Laterality: N/A;  . LEFT HEART CATH AND CORONARY ANGIOGRAPHY N/A 08/03/2019   Procedure: LEFT HEART CATH AND CORONARY ANGIOGRAPHY;  Surgeon: Jettie Booze, MD;  Location: [facility] CV LAB;  Service: Cardiovascular;  Laterality: N/A;  .  SHOULDER SURGERY     bilateral  . WISDOM TOOTH EXTRACTION      Family History  Problem Relation Age of Onset  . Colon cancer Neg Hx   . Esophageal cancer Neg Hx   . Rectal cancer Neg Hx   . Stomach cancer Neg Hx   . Colon polyps Neg Hx    Social History   Socioeconomic History  . Marital status: Widowed    Spouse name: Not on file  . Number of children: 2  . Years of education: Not on file  . Highest education level: Not on file  Occupational History  . Not on file  Tobacco Use  . Smoking status: Former Smoker    Packs/day: 0.50    Years: 35.00    Pack years: 17.50    Types: Cigarettes    Quit date:  02/10/1993    Years since quitting: 27.1  . Smokeless tobacco: Former Systems developer    Types: [place] date: 02/10/2006  Vaping Use  . Vaping Use: Never used  Substance and Sexual Activity  . Alcohol use: Yes    Alcohol/week: 1.0 standard drink    Types: 1 Cans of beer per week    Comment: occ  . Drug use: No  . Sexual activity: Yes    Partners: Female  Other Topics Concern  . Not on file  Social History Narrative  . Not on file   Social Determinants of Health   Financial Resource Strain: Not on file  Food Insecurity: Not on file  Transportation Needs: Not on file  Physical Activity: Not on file  Stress: Not on file  Social Connections: Not on file    Review of Systems  Constitutional: Positive for fatigue. Negative for chills, diaphoresis and fever.  HENT: Negative for congestion, ear pain and sore throat.   Respiratory: Positive for cough and shortness of breath.   Cardiovascular: Positive for palpitations. Negative for chest pain and leg swelling.  Gastrointestinal: Negative for abdominal pain, constipation, diarrhea, nausea and vomiting.  Genitourinary: Negative for dysuria and urgency.  Musculoskeletal: Positive for back pain. Negative for arthralgias and myalgias.  Neurological: Positive for weakness, light-headedness and headaches. Negative for dizziness.   Psychiatric/Behavioral: Negative for dysphoric mood. The patient is not nervous/anxious.      Objective:  BP 128/78   Pulse 80   Temp (!) 97.2 F (36.2 C)   Resp 18   Ht 5\' 11"  (1.803 m)   Wt 196 lb (88.9 kg)   BMI 27.34 kg/m   BP/Weight 04/13/2020 [id number]  Systolic BP [id number]  Diastolic BP 72 78 76  Wt. (Lbs) 196 196 200  BMI 27.34 27.34 27.89    Physical Exam Constitutional:      Appearance: Normal appearance.  HENT:     Right Ear: Tympanic membrane, ear canal and external ear normal.     Left Ear: Tympanic membrane, ear canal and external ear normal.     Nose: Nose normal. No congestion or rhinorrhea.     Mouth/Throat:     Mouth: Mucous membranes are moist.     Pharynx: No oropharyngeal exudate or posterior oropharyngeal erythema.  Cardiovascular:     Rate and Rhythm: Normal rate and regular rhythm.     Heart sounds: Normal heart sounds.  Pulmonary:     Effort: Pulmonary effort is normal. No respiratory distress.     Breath sounds: Normal breath sounds. No wheezing, rhonchi or rales.  Abdominal:     General: Bowel sounds are normal.     Palpations: Abdomen is soft.     Tenderness: There is no abdominal tenderness.  Lymphadenopathy:     Cervical: No cervical adenopathy.  Neurological:     Mental Status: He is alert.  Psychiatric:        Mood and Affect: Mood normal.        Behavior: Behavior normal.     Diabetic Foot Exam - Simple   No data filed      Lab Results  Component Value Date   WBC 6.4 03/15/2020   HGB 16.1 03/15/2020   HCT 48.0 03/15/2020   PLT 207 03/15/2020   GLUCOSE 108 (H) 12/14/2019   CHOL 106 12/02/2019   TRIG 100 12/02/2019   HDL 43 12/02/2019   LDLCALC 44 12/02/2019   ALT 17 12/14/2019  AST 19 12/14/2019   NA 137 12/14/2019   K 4.1 12/14/2019   CL 102 12/14/2019   CREATININE 0.77 12/14/2019   BUN 20 12/14/2019   CO2 21 (L) 12/14/2019   TSH 3.000 08/31/2019   INR 1.0 12/02/2019   HGBA1C 6.0 (H) 12/02/2019    MICROALBUR 30 08/31/2019     Assessment & Plan:   1. Lower respiratory tract infection due to COVID-19 virus Complete treatments given 5 days ago.  2. Paroxysmal supraventricular tachycardia (HCC) Stable.  3. Centrilobular emphysema (HCC) - Peak flow meter  Consider inhalers in the future.      Orders Placed This Encounter  Procedures  . Peak flow meter    Follow-up: No follow-ups on file.  An After Visit Summary was printed and given to the patient.  Rochel Brome, MD [facility] [phone]

## 2020-03-20 ENCOUNTER — Other Ambulatory Visit: Payer: Self-pay

## 2020-03-20 ENCOUNTER — Ambulatory Visit (INDEPENDENT_AMBULATORY_CARE_PROVIDER_SITE_OTHER): Payer: Medicare PPO | Admitting: Family Medicine

## 2020-03-20 VITALS — BP 128/78 | HR 80 | Temp 97.2°F | Resp 18 | Ht 71.0 in | Wt 196.0 lb

## 2020-03-20 DIAGNOSIS — R0602 Shortness of breath: Secondary | ICD-10-CM

## 2020-03-20 DIAGNOSIS — J22 Unspecified acute lower respiratory infection: Secondary | ICD-10-CM | POA: Diagnosis not present

## 2020-03-20 DIAGNOSIS — I471 Supraventricular tachycardia: Secondary | ICD-10-CM | POA: Diagnosis not present

## 2020-03-20 DIAGNOSIS — J432 Centrilobular emphysema: Secondary | ICD-10-CM | POA: Diagnosis not present

## 2020-03-20 DIAGNOSIS — U071 COVID-19: Secondary | ICD-10-CM

## 2020-03-20 DIAGNOSIS — J189 Pneumonia, unspecified organism: Secondary | ICD-10-CM

## 2020-04-05 DIAGNOSIS — L821 Other seborrheic keratosis: Secondary | ICD-10-CM | POA: Diagnosis not present

## 2020-04-05 DIAGNOSIS — C44319 Basal cell carcinoma of skin of other parts of face: Secondary | ICD-10-CM | POA: Diagnosis not present

## 2020-04-05 DIAGNOSIS — L578 Other skin changes due to chronic exposure to nonionizing radiation: Secondary | ICD-10-CM | POA: Diagnosis not present

## 2020-04-05 DIAGNOSIS — L570 Actinic keratosis: Secondary | ICD-10-CM | POA: Diagnosis not present

## 2020-04-12 NOTE — Progress Notes (Signed)
Subjective:  Patient ID: [name], male    DOB: [birth year]  Age: 80 y.o. MRN: [id number]  Chief Complaint  Patient presents with  . Shortness of Breath  . Pneumonia    HPI Patient presents for a 4 week follow up of pneumonia. Pt is feeling much better. Occasionally has a little cough still, but overall much better.  .  Current Outpatient Medications on File Prior to Visit  Medication Sig Dispense Refill  . aspirin EC 81 MG tablet Take 1 tablet (81 mg total) by mouth daily. 30 tablet 11  . atorvastatin (LIPITOR) 40 MG tablet Take 1 tablet (40 mg total) by mouth daily. 90 tablet 1  . benzonatate (TESSALON) 200 MG capsule Take 1 capsule (200 mg total) by mouth 3 (three) times daily as needed for cough. (Patient not taking: Reported on 04/20/2020) 30 capsule 0  . cholecalciferol (VITAMIN D) 1000 UNITS tablet Take 1,000 Units by mouth daily.    Marland Kitchen gabapentin (NEURONTIN) 600 MG tablet TAKE 1 TABLET BY MOUTH THREE TIMES A DAY (Patient not taking: Reported on 04/20/2020) 270 tablet 1  . Glycerin-Polysorbate 80 1-1 % SOLN Place 1 drop into both eyes 2 (two) times daily as needed (for dry eyes).     Marland Kitchen HYDROcodone-acetaminophen (NORCO) 10-325 MG tablet Take 1 tablet by mouth daily as needed for severe pain. (Patient not taking: Reported on 04/20/2020)    . lisinopril (ZESTRIL) 20 MG tablet Take 1 tablet (20 mg total) by mouth daily. 90 tablet 3  . meloxicam (MOBIC) 15 MG tablet Take 15 mg by mouth daily.    . metoCLOPramide (REGLAN) 10 MG tablet Take 1 tablet (10 mg total) by mouth every 6 (six) hours as needed for nausea, vomiting or refractory nausea / vomiting (hiccups). (Patient not taking: Reported on 04/20/2020) 40 tablet 0  . Multiple Vitamins-Minerals (PRESERVISION AREDS 2 PO) Take 1 tablet by mouth in the morning and at bedtime.    . nitroGLYCERIN (NITROSTAT) 0.4 MG SL tablet Place 1 tablet (0.4 mg total) under the tongue every 5 (five) minutes x 3 doses as needed for chest pain. 25 tablet  12  . Omega-3 Fatty Acids (FISH OIL) 1200 MG CAPS Take 1,200 mg by mouth daily.     . tamsulosin (FLOMAX) 0.4 MG CAPS capsule Take 0.4 mg by mouth daily after supper.   1   Current Facility-Administered Medications on File Prior to Visit  Medication Dose Route Frequency Provider Last Rate Last Admin  . 0.9 %  sodium chloride infusion  500 mL Intravenous Once Jackquline Denmark, MD       Past Medical History:  Diagnosis Date  . Angina pectoris ([place]) 08/02/2019  . Arthritis    on meds  . Atrial fibrillation ([place])   . Cancer ([place]) 10/2015   prostate cancer treated with radiation  . Cataract    sx-bilaterally  . Chest tightness 06/28/2019  . Colitis 11/22/2018  . 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 Depression 04/04/2019  . Diabetes mellitus due to underlying condition with unspecified complications ([place]) 10/26/3844  . Diabetes mellitus without complication ([place])    Type II  . Diarrhea 11/21/2018  . Dyslipidemia 06/17/2016  . Dyspnea on exertion 06/28/2019  . Dysrhythmia   . Essential hypertension 06/17/2016  . GERD (gastroesophageal reflux disease)    on meds  . Headache   . History of hiatal hernia   .  History of kidney stones   . History of prostate cancer 05/20/2019  . Hypercholesteremia 04/04/2019  . Hyperlipemia   . Hypertension   . Impaired fasting blood sugar 05/20/2019  . Malignant neoplasm prostate ([place]) 05/20/2019  . Mixed hyperlipidemia 08/31/2019  . Paroxysmal atrial fibrillation ([place]) 04/04/2019  . PONV (postoperative nausea and vomiting)   . 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 S/P lumbar fusion 12/09/2019  . S/P lumbar spinal fusion 09/29/2013  . Sepsis ([place]) 11/22/2018   Past Surgical History:  Procedure Laterality Date  . anal fissures    . ANTERIOR CERVICAL DECOMP/DISCECTOMY FUSION N/A 03/04/2018   Procedure: Anterior Cervical Decompression Fusion -  Cervical three-Cervical four - Cervical four-Cervical five;  Surgeon: Eustace Moore, MD;  Location: [place];  Service: Neurosurgery;  Laterality: N/A;  . BACK SURGERY    . CARDIAC CATHETERIZATION     no PCI  . CARPAL TUNNEL RELEASE Left 03/04/2018   Procedure: Carpal Tunnel Release - left;  Surgeon: Eustace Moore, MD;  Location: [place];  Service: Neurosurgery;  Laterality: Left;  . CHOLECYSTECTOMY    . COLONOSCOPY W/ POLYPECTOMY  08/30/2012   Colonic polyp status post polypectomy.  Pancolonic diverticulosis predominantly in the sigmoid colon. Small internal hemorrhoids. Moderate internal hemorhroids.   . ESOPHAGOGASTRODUODENOSCOPY  08/04/2016   Schatzkis ring status post esophageal dilatation. Small hiatal hernia. Mild gastritis.   Marland Kitchen EYE SURGERY     cataract removal bilateral eyes  . FOOT SURGERY     left heel  . HERNIA REPAIR    . LAMINECTOMY WITH POSTERIOR LATERAL ARTHRODESIS LEVEL 2 N/A 12/09/2019   Procedure: Laminectomy and Foraminotomy - Lumbar one-two, Lumbar two-three, posterolateral instrumented fusion Lumbar one-three, removal of instrumentation Lumbar three-five;  Surgeon: Eustace Moore, MD;  Location: [place];  Service: Neurosurgery;  Laterality: N/A;  . LEFT HEART CATH AND CORONARY ANGIOGRAPHY N/A 08/03/2019   Procedure: LEFT HEART CATH AND CORONARY ANGIOGRAPHY;  Surgeon: Jettie Booze, MD;  Location: [facility];  Service: Cardiovascular;  Laterality: N/A;  . SHOULDER SURGERY     bilateral  . WISDOM TOOTH EXTRACTION      Family History  Problem Relation Age of Onset  . Colon cancer Neg Hx   . Esophageal cancer Neg Hx   . Rectal cancer Neg Hx   . Stomach cancer Neg Hx   . Colon polyps Neg Hx    Social History   Socioeconomic History  . Marital status: Widowed    Spouse name: Not on file  . Number of children: 2  . Years of education: Not on file  . Highest education level: Not on file  Occupational History  . Not on file  Tobacco Use  . Smoking  status: Former Smoker    Packs/day: 0.50    Years: 35.00    Pack years: 17.50    Types: Cigarettes    Quit date: 02/10/1993    Years since quitting: 27.2  . Smokeless tobacco: Former Systems developer    Types: [place] date: 02/10/2006  Vaping Use  . Vaping Use: Never used  Substance and Sexual Activity  . Alcohol use: Yes    Alcohol/week: 1.0 standard drink    Types: 1 Cans of beer per week    Comment: occ  . Drug use: No  . Sexual activity: Yes    Partners: Female  Other Topics Concern  . Not on file  Social History Narrative  . Not on file  Social Determinants of Health   Financial Resource Strain: Not on file  Food Insecurity: Not on file  Transportation Needs: Not on file  Physical Activity: Not on file  Stress: Not on file  Social Connections: Not on file    Review of Systems  Constitutional: Negative for chills, diaphoresis, fatigue and fever.  HENT: Negative for congestion, ear pain and sore throat.   Respiratory: Positive for shortness of breath. Negative for cough.   Cardiovascular: Positive for chest pain. Negative for leg swelling.  Gastrointestinal: Negative for abdominal pain, constipation, diarrhea, nausea and vomiting.  Genitourinary: Negative for dysuria and urgency.  Musculoskeletal: Positive for back pain. Negative for arthralgias and myalgias.  Neurological: Negative for dizziness and headaches.  Psychiatric/Behavioral: Negative for dysphoric mood. The patient is not nervous/anxious.      Objective:  BP 130/72   Pulse 88   Temp 97.6 F (36.4 C)   Resp 18   Ht 5\' 11"  (1.803 m)   Wt 196 lb (88.9 kg)   BMI 27.34 kg/m   BP/Weight 04/13/2020 07/18/3417 07/13/2295  Systolic BP 989 211 941  Diastolic BP 72 78 76  Wt. (Lbs) 196 196 200  BMI 27.34 27.34 27.89    Physical Exam Vitals reviewed.  Constitutional:      Appearance: Normal appearance. He is normal weight.  Cardiovascular:     Rate and Rhythm: Normal rate and regular rhythm.     Heart sounds:  No murmur heard.   Pulmonary:     Effort: Pulmonary effort is normal.     Breath sounds: Normal breath sounds.  Abdominal:     General: Abdomen is flat. Bowel sounds are normal.     Palpations: Abdomen is soft.     Tenderness: There is no abdominal tenderness.  Musculoskeletal:     Left lower leg: Tenderness (lumbar paraspinal muscles.) present.  Neurological:     Mental Status: He is alert and oriented to person, place, and time.  Psychiatric:        Mood and Affect: Mood normal.        Behavior: Behavior normal.     Diabetic Foot Exam - Simple   No data filed      Lab Results  Component Value Date   WBC 6.4 03/15/2020   HGB 16.1 03/15/2020   HCT 48.0 03/15/2020   PLT 207 03/15/2020   GLUCOSE 108 (H) 12/14/2019   CHOL 106 12/02/2019   TRIG 100 12/02/2019   HDL 43 12/02/2019   LDLCALC 44 12/02/2019   ALT 17 12/14/2019   AST 19 12/14/2019   NA 137 12/14/2019   K 4.1 12/14/2019   CL 102 12/14/2019   CREATININE 0.77 12/14/2019   BUN 20 12/14/2019   CO2 21 (L) 12/14/2019   TSH 3.000 08/31/2019   INR 1.0 12/02/2019   HGBA1C 6.0 (H) 12/02/2019   MICROALBUR 30 08/31/2019      Assessment & Plan:   1. Community acquired pneumonia of left lower lobe of lung Completed antibiotic.  2. SOB (shortness of breath) Improved.   3. Lumbar back pain  Start methocarbamol.  Meds ordered this encounter  Medications  . methocarbamol (ROBAXIN) 500 MG tablet    Sig: Take 1 tablet (500 mg total) by mouth every 6 (six) hours as needed for muscle spasms.    Dispense:  120 tablet    Refill:  0   Follow-up: No follow-ups on file.  An After Visit Summary was printed and given to the patient.  Rochel Brome, MD [facility] [phone]

## 2020-04-13 ENCOUNTER — Ambulatory Visit: Payer: Medicare PPO | Admitting: Family Medicine

## 2020-04-13 ENCOUNTER — Other Ambulatory Visit: Payer: Self-pay

## 2020-04-13 VITALS — BP 130/72 | HR 88 | Temp 97.6°F | Resp 18 | Ht 71.0 in | Wt 196.0 lb

## 2020-04-13 DIAGNOSIS — J189 Pneumonia, unspecified organism: Secondary | ICD-10-CM

## 2020-04-13 DIAGNOSIS — R0602 Shortness of breath: Secondary | ICD-10-CM | POA: Diagnosis not present

## 2020-04-13 DIAGNOSIS — M545 Low back pain, unspecified: Secondary | ICD-10-CM | POA: Diagnosis not present

## 2020-04-13 MED ORDER — METHOCARBAMOL 500 MG PO TABS: 500.0000 mg | ORAL_TABLET | Freq: Four times a day (QID) | ORAL | 0 refills | Status: DC | PRN

## 2020-04-15 ENCOUNTER — Encounter: Payer: Self-pay | Admitting: Family Medicine

## 2020-04-16 ENCOUNTER — Ambulatory Visit: Payer: Medicare PPO | Admitting: Family Medicine

## 2020-04-17 ENCOUNTER — Other Ambulatory Visit: Payer: Self-pay | Admitting: Physician Assistant

## 2020-04-18 ENCOUNTER — Other Ambulatory Visit: Payer: Self-pay | Admitting: Family Medicine

## 2020-04-19 DIAGNOSIS — M7061 Trochanteric bursitis, right hip: Secondary | ICD-10-CM | POA: Insufficient documentation

## 2020-04-19 DIAGNOSIS — M5416 Radiculopathy, lumbar region: Secondary | ICD-10-CM | POA: Diagnosis not present

## 2020-04-19 HISTORY — DX: Trochanteric bursitis, right hip: M70.61

## 2020-04-20 ENCOUNTER — Telehealth: Payer: Self-pay

## 2020-04-20 ENCOUNTER — Other Ambulatory Visit: Payer: Self-pay | Admitting: Neurological Surgery

## 2020-04-20 DIAGNOSIS — M5416 Radiculopathy, lumbar region: Secondary | ICD-10-CM

## 2020-04-20 NOTE — Telephone Encounter (Signed)
Phone call to patient to verify medication list and allergies for myelogram procedure. Medications pt is currently taking are safe to continue to take. Advised pt if any new medications are started prior to procedure to call and make us aware. Pt also instructed to have a driver the day of the procedure, he would be with us around 2 hours, and discharge instructions reviewed. Pt verbalized understanding. 

## 2020-04-22 ENCOUNTER — Encounter: Payer: Self-pay | Admitting: Family Medicine

## 2020-04-23 ENCOUNTER — Inpatient Hospital Stay: Admission: RE | Admit: 2020-04-23 | Payer: Medicare PPO | Source: Ambulatory Visit

## 2020-04-23 ENCOUNTER — Other Ambulatory Visit: Payer: Medicare PPO

## 2020-04-25 ENCOUNTER — Ambulatory Visit
Admission: RE | Admit: 2020-04-25 | Discharge: 2020-04-25 | Disposition: A | Discharge status: Home / Self Care | Payer: Medicare PPO | Source: Ambulatory Visit | Attending: Neurological Surgery | Admitting: Neurological Surgery

## 2020-04-25 ENCOUNTER — Other Ambulatory Visit: Payer: Self-pay

## 2020-04-25 DIAGNOSIS — M4326 Fusion of spine, lumbar region: Secondary | ICD-10-CM | POA: Diagnosis not present

## 2020-04-25 DIAGNOSIS — M5126 Other intervertebral disc displacement, lumbar region: Secondary | ICD-10-CM | POA: Diagnosis not present

## 2020-04-25 DIAGNOSIS — M5416 Radiculopathy, lumbar region: Secondary | ICD-10-CM

## 2020-04-25 MED ORDER — IOPAMIDOL (ISOVUE-M 200) INJECTION 41%
20.0000 mL | Freq: Once | INTRAMUSCULAR | Status: AC
  Administered 2020-04-25: 20 mL via INTRATHECAL

## 2020-04-25 MED ORDER — DIAZEPAM 5 MG PO TABS
5.0000 mg | ORAL_TABLET | Freq: Once | ORAL | Status: AC
  Administered 2020-04-25: 5 mg via ORAL

## 2020-04-25 NOTE — Discharge Instructions (Signed)

## 2020-05-08 DIAGNOSIS — S32009K Unspecified fracture of unspecified lumbar vertebra, subsequent encounter for fracture with nonunion: Secondary | ICD-10-CM | POA: Diagnosis not present

## 2020-05-08 DIAGNOSIS — Z6826 Body mass index (BMI) 26.0-26.9, adult: Secondary | ICD-10-CM | POA: Diagnosis not present

## 2020-05-08 HISTORY — DX: Body mass index (BMI) 26.0-26.9, adult: Z68.26

## 2020-05-09 ENCOUNTER — Other Ambulatory Visit: Payer: Self-pay | Admitting: Neurological Surgery

## 2020-05-13 ENCOUNTER — Other Ambulatory Visit: Payer: Self-pay | Admitting: Family Medicine

## 2020-05-13 ENCOUNTER — Other Ambulatory Visit: Payer: Self-pay | Admitting: Cardiology

## 2020-05-17 ENCOUNTER — Other Ambulatory Visit (HOSPITAL_COMMUNITY): Payer: Medicare PPO

## 2020-05-28 NOTE — Pre-Procedure Instructions (Addendum)
Surgical Instructions    Your procedure is scheduled on Wednesday, April 20th at 1:22 P.M..  Report to [facility] Main Entrance "A" at 11:20 A.M., then check in with the Admitting office.  Call this number if you have problems the morning of surgery:  [phone]   If you have any questions prior to your surgery date call [phone]: Open Monday-Friday 8am-4pm    Remember:  Do not eat or drink after midnight the night before your surgery    Take these medicines the morning of surgery with A SIP OF WATER  atorvastatin (LIPITOR)  gabapentin (NEURONTIN) pantoprazole (PROTONIX)   Take these medications AS NEEDED the morning of surgery. Eye drops HYDROcodone-acetaminophen (NORCO) methocarbamol (ROBAXIN)  Nitroglycerin-please let a nurse know if you had to use this  Follow your surgeon's instructions on when to stop Aspirin.  If no instructions were given by your surgeon then you will need to call the office to get those instructions.    As of today, STOP taking any Aspirin (unless otherwise instructed by your surgeon) Aleve, Naproxen, Ibuprofen, Motrin, Advil, Goody's, BC's, all herbal medications, fish oil, and all vitamins. This includes: meloxicam (MOBIC).           WHAT DO I DO ABOUT MY DIABETES MEDICATION?   Marland Kitchen Do not take metFORMIN (GLUCOPHAGE) the morning of surgery.  HOW TO MANAGE YOUR DIABETES BEFORE AND AFTER SURGERY  Why is it important to control my blood sugar before and after surgery? . Improving blood sugar levels before and after surgery helps healing and can limit problems. . A way of improving blood sugar control is eating a healthy diet by: o  Eating less sugar and carbohydrates o  Increasing activity/exercise o  Talking with your doctor about reaching your blood sugar goals . High blood sugars (greater than 180 mg/dL) can raise your risk of infections and slow your recovery, so you will need to focus on controlling your diabetes during the weeks before  surgery. . Make sure that the doctor who takes care of your diabetes knows about your planned surgery including the date and location.  How do I manage my blood sugar before surgery? . Check your blood sugar at least 4 times a day, starting 2 days before surgery, to make sure that the level is not too high or low. . Check your blood sugar the morning of your surgery when you wake up and every 2 hours until you get to the Short Stay unit. o If your blood sugar is less than 70 mg/dL, you will need to treat for low blood sugar: - Do not take insulin. - Treat a low blood sugar (less than 70 mg/dL) with  cup of clear juice (cranberry or apple), 4 glucose tablets, OR glucose gel. - Recheck blood sugar in 15 minutes after treatment (to make sure it is greater than 70 mg/dL). If your blood sugar is not greater than 70 mg/dL on recheck, call [phone] for further instructions. . Report your blood sugar to the short stay nurse when you get to Short Stay.  . If you are admitted to the hospital after surgery: o Your blood sugar will be checked by the staff and you will probably be given insulin after surgery (instead of oral diabetes medicines) to make sure you have good blood sugar levels. o The goal for blood sugar control after surgery is 80-180 mg/dL.             Do NOT Smoke (Tobacco/Vaping) or drink  Alcohol 24 hours prior to your procedure.  If you use a CPAP at night, you may bring all equipment for your overnight stay.   Contacts, glasses, piercing's, hearing aid's, dentures or partials may not be worn into surgery, please bring cases for these belongings.    For patients admitted to the hospital, discharge time will be determined by your treatment team.   Patients discharged the day of surgery will not be allowed to drive home, and someone needs to stay with them for 24 hours.    Special instructions:   [place]- Preparing For Surgery  Before surgery, you can play an important  role. Because skin is not sterile, your skin needs to be as free of germs as possible. You can reduce the number of germs on your skin by washing with CHG (chlorahexidine gluconate) Soap before surgery.  CHG is an antiseptic cleaner which kills germs and bonds with the skin to continue killing germs even after washing.    Oral Hygiene is also important to reduce your risk of infection.  Remember - BRUSH YOUR TEETH THE MORNING OF SURGERY WITH YOUR REGULAR TOOTHPASTE  Please do not use if you have an allergy to CHG or antibacterial soaps. If your skin becomes reddened/irritated stop using the CHG.  Do not shave (including legs and underarms) for at least 48 hours prior to first CHG shower. It is OK to shave your face.  Please follow these instructions carefully.   1. Shower the NIGHT BEFORE SURGERY and the MORNING OF SURGERY  2. If you chose to wash your hair, wash your hair first as usual with your normal shampoo.  3. After you shampoo, rinse your hair and body thoroughly to remove the shampoo.  4. Use CHG Soap as you would any other liquid soap. You can apply CHG directly to the skin and wash gently with a scrungie or a clean washcloth.   5. Apply the CHG Soap to your body ONLY FROM THE NECK DOWN.  Do not use on open wounds or open sores. Avoid contact with your eyes, ears, mouth and genitals (private parts). Wash Face and genitals (private parts)  with your normal soap.   6. Wash thoroughly, paying special attention to the area where your surgery will be performed.  7. Thoroughly rinse your body with warm water from the neck down.  8. DO NOT shower/wash with your normal soap after using and rinsing off the CHG Soap.  9. Pat yourself dry with a CLEAN TOWEL.  10. Wear CLEAN PAJAMAS to bed the night before surgery  11. Place CLEAN SHEETS on your bed the night before your surgery  12. DO NOT SLEEP WITH PETS.   Day of Surgery: Shower with CHG soap. Do not wear jewelry. Do not wear  lotions, powders, colognes, or deodorant. Men may shave face and neck. Do not bring valuables to the hospital. [facility] is not responsible for any belongings or valuables. Wear Clean/Comfortable clothing the morning of surgery Remember to brush your teeth WITH YOUR REGULAR TOOTHPASTE.   Please read over the following fact sheets that you were given.

## 2020-05-29 ENCOUNTER — Encounter (HOSPITAL_COMMUNITY): Payer: Self-pay

## 2020-05-29 ENCOUNTER — Encounter (HOSPITAL_COMMUNITY)
Admission: RE | Admit: 2020-05-29 | Discharge: 2020-05-29 | Disposition: A | Discharge status: Home / Self Care | Payer: Medicare PPO | Source: Ambulatory Visit | Attending: Neurological Surgery | Admitting: Neurological Surgery

## 2020-05-29 ENCOUNTER — Other Ambulatory Visit: Payer: Self-pay

## 2020-05-29 DIAGNOSIS — T84038A Mechanical loosening of other internal prosthetic joint, initial encounter: Secondary | ICD-10-CM | POA: Diagnosis not present

## 2020-05-29 DIAGNOSIS — Z8546 Personal history of malignant neoplasm of prostate: Secondary | ICD-10-CM | POA: Diagnosis not present

## 2020-05-29 DIAGNOSIS — M960 Pseudarthrosis after fusion or arthrodesis: Secondary | ICD-10-CM | POA: Insufficient documentation

## 2020-05-29 DIAGNOSIS — Z791 Long term (current) use of non-steroidal anti-inflammatories (NSAID): Secondary | ICD-10-CM | POA: Insufficient documentation

## 2020-05-29 DIAGNOSIS — Z8616 Personal history of COVID-19: Secondary | ICD-10-CM | POA: Diagnosis not present

## 2020-05-29 DIAGNOSIS — E782 Mixed hyperlipidemia: Secondary | ICD-10-CM | POA: Diagnosis present

## 2020-05-29 DIAGNOSIS — Z79899 Other long term (current) drug therapy: Secondary | ICD-10-CM | POA: Diagnosis not present

## 2020-05-29 DIAGNOSIS — A419 Sepsis, unspecified organism: Secondary | ICD-10-CM | POA: Diagnosis not present

## 2020-05-29 DIAGNOSIS — M1990 Unspecified osteoarthritis, unspecified site: Secondary | ICD-10-CM | POA: Diagnosis present

## 2020-05-29 DIAGNOSIS — E119 Type 2 diabetes mellitus without complications: Secondary | ICD-10-CM | POA: Diagnosis present

## 2020-05-29 DIAGNOSIS — E785 Hyperlipidemia, unspecified: Secondary | ICD-10-CM | POA: Insufficient documentation

## 2020-05-29 DIAGNOSIS — Z7984 Long term (current) use of oral hypoglycemic drugs: Secondary | ICD-10-CM | POA: Insufficient documentation

## 2020-05-29 DIAGNOSIS — Z923 Personal history of irradiation: Secondary | ICD-10-CM | POA: Diagnosis not present

## 2020-05-29 DIAGNOSIS — K219 Gastro-esophageal reflux disease without esophagitis: Secondary | ICD-10-CM | POA: Insufficient documentation

## 2020-05-29 DIAGNOSIS — Z7901 Long term (current) use of anticoagulants: Secondary | ICD-10-CM | POA: Insufficient documentation

## 2020-05-29 DIAGNOSIS — Z7982 Long term (current) use of aspirin: Secondary | ICD-10-CM | POA: Diagnosis not present

## 2020-05-29 DIAGNOSIS — Z981 Arthrodesis status: Secondary | ICD-10-CM | POA: Diagnosis not present

## 2020-05-29 DIAGNOSIS — I2510 Atherosclerotic heart disease of native coronary artery without angina pectoris: Secondary | ICD-10-CM | POA: Insufficient documentation

## 2020-05-29 DIAGNOSIS — I10 Essential (primary) hypertension: Secondary | ICD-10-CM | POA: Insufficient documentation

## 2020-05-29 DIAGNOSIS — I480 Paroxysmal atrial fibrillation: Secondary | ICD-10-CM | POA: Insufficient documentation

## 2020-05-29 DIAGNOSIS — Z87891 Personal history of nicotine dependence: Secondary | ICD-10-CM | POA: Insufficient documentation

## 2020-05-29 DIAGNOSIS — Z01812 Encounter for preprocedural laboratory examination: Secondary | ICD-10-CM | POA: Insufficient documentation

## 2020-05-29 DIAGNOSIS — M4326 Fusion of spine, lumbar region: Secondary | ICD-10-CM | POA: Diagnosis not present

## 2020-05-29 DIAGNOSIS — E7800 Pure hypercholesterolemia, unspecified: Secondary | ICD-10-CM | POA: Diagnosis not present

## 2020-05-29 DIAGNOSIS — Z885 Allergy status to narcotic agent status: Secondary | ICD-10-CM | POA: Diagnosis not present

## 2020-05-29 DIAGNOSIS — T84296A Other mechanical complication of internal fixation device of vertebrae, initial encounter: Secondary | ICD-10-CM | POA: Diagnosis not present

## 2020-05-29 LAB — BASIC METABOLIC PANEL
Anion gap: 10 (ref 5–15)
BUN: 26 mg/dL — ABNORMAL HIGH (ref 8–23)
CO2: 24 mmol/L (ref 22–32)
Calcium: 9.6 mg/dL (ref 8.9–10.3)
Chloride: 106 mmol/L (ref 98–111)
Creatinine, Ser: 1.03 mg/dL (ref 0.61–1.24)
GFR, Estimated: >60 mL/min (ref >60)
Glucose, Bld: 121 mg/dL — ABNORMAL HIGH (ref 70–99)
Potassium: 4.9 mmol/L (ref 3.5–5.1)
Sodium: 140 mmol/L (ref 135–145)

## 2020-05-29 LAB — CBC WITH DIFFERENTIAL/PLATELET
Abs Immature Granulocytes: 0.03 10*3/uL (ref 0.00–0.07)
Basophils Absolute: 0 10*3/uL (ref 0.0–0.1)
Basophils Relative: 0 %
Eosinophils Absolute: 0 10*3/uL (ref 0.0–0.5)
Eosinophils Relative: 1 %
HCT: 45.6 % (ref 39.0–52.0)
Hemoglobin: 15.5 g/dL (ref 13.0–17.0)
Immature Granulocytes: 0 %
Lymphocytes Relative: 13 %
Lymphs Abs: 0.9 10*3/uL (ref 0.7–4.0)
MCH: 30.6 pg (ref 26.0–34.0)
MCHC: 34 g/dL (ref 30.0–36.0)
MCV: 90.1 fL (ref 80.0–100.0)
Monocytes Absolute: 0.7 10*3/uL (ref 0.1–1.0)
Monocytes Relative: 11 %
Neutro Abs: 5.3 10*3/uL (ref 1.7–7.7)
Neutrophils Relative %: 75 %
Platelets: 194 10*3/uL (ref 150–400)
RBC: 5.06 MIL/uL (ref 4.22–5.81)
RDW: 15.2 % (ref 11.5–15.5)
WBC: 7 10*3/uL (ref 4.0–10.5)
nRBC: 0 % (ref 0.0–0.2)

## 2020-05-29 LAB — SURGICAL PCR SCREEN
MRSA, PCR: NEGATIVE
Staphylococcus aureus: NEGATIVE

## 2020-05-29 LAB — PROTIME-INR
INR: 0.9 (ref 0.8–1.2)
Prothrombin Time: 12.1 seconds (ref 11.4–15.2)

## 2020-05-29 LAB — TYPE AND SCREEN
ABO/RH(D): O POS
Antibody Screen: NEGATIVE

## 2020-05-29 LAB — HEMOGLOBIN A1C
Hgb A1c MFr Bld: 6.2 % — ABNORMAL HIGH (ref 4.8–5.6)
Mean Plasma Glucose: 131.24 mg/dL

## 2020-05-29 NOTE — Anesthesia Preprocedure Evaluation (Addendum)
Anesthesia Evaluation  Patient identified by MRN, date of birth, ID band Patient awake    Reviewed: Allergy & Precautions, NPO status , Patient's Chart, lab work & pertinent test results  History of Anesthesia Complications (+) PONV  Airway Mallampati: II  TM Distance: >3 FB Neck ROM: Full    Dental  (+) Missing, Dental Advisory Given   Pulmonary former smoker,  03/15/2020 COVID pos   breath sounds clear to auscultation       Cardiovascular hypertension, Pt. on medications (-) angina+ CAD (non-obstructive)  + dysrhythmias Atrial Fibrillation  Rhythm:Regular Rate:Normal  '21 Stress: EF: 69%, hyperdynamic (>65%). no ST segment deviation noted during stress.  This is a low risk study.  No evidence of ischemia or MI  '21 cath: non-obstructive, EF 55-65%  '21 ECHO: EF 55-60%, normal LVF, Grade 1 DD, mild asc aorta dilation, no AI or AS   Neuro/Psych  Headaches, Depression Back pain    GI/Hepatic Neg liver ROS, hiatal hernia, GERD  Medicated and Controlled,  Endo/Other  diabetes (glu 110), Oral Hypoglycemic Agents  Renal/GU negative Renal ROS     Musculoskeletal   Abdominal   Peds  Hematology   Anesthesia Other Findings Prostate cancer  Reproductive/Obstetrics                           Anesthesia Physical Anesthesia Plan  ASA: III  Anesthesia Plan: General   Post-op Pain Management:    Induction: Intravenous  PONV Risk Score and Plan: 3 and Ondansetron, Dexamethasone and Treatment may vary due to age or medical condition  Airway Management Planned: Oral ETT  Additional Equipment: None  Intra-op Plan:   Post-operative Plan: Extubation in OR  Informed Consent: I have reviewed the patients History and Physical, chart, labs and discussed the procedure including the risks, benefits and alternatives for the proposed anesthesia with the patient or authorized representative who has  indicated his/her understanding and acceptance.     Dental advisory given  Plan Discussed with: CRNA and Surgeon  Anesthesia Plan Comments: (PAT note written 05/29/2020 by Myra Gianotti, PA-C. )      Anesthesia Quick Evaluation

## 2020-05-29 NOTE — Progress Notes (Signed)
Anesthesia Chart Review:  Case: [id number] Date/Time: 05/30/20 1307   Procedures:      XLIF - L1-L2, lateral plate, removal of pedicle screws L1 (N/A )     HARDWARE REMOVAL (N/A Back)   Anesthesia type: General   Pre-op diagnosis: Pseudoarthrosis   Location: [facility] OR ROOM 80 / [place] [facility]   Surgeons: Eustace Moore, MD      DISCUSSION: Patient is an 80 year old male scheduled for the above procedure. S/p multiple back surgeries, last 12/09/19.   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   03/15/20 note by PCP Cox, Kirsten, MD indicates patient was + COVID-19, so he did not get a presurgical COVID-19 test.   Anesthesia team to evaluate on the day of surgery.   VS: BP (!) 146/82   Pulse 86   Temp 36.8 C (Oral)   Resp 18   Ht 5\' 11"  (1.803 m)   Wt 87.1 kg   SpO2 98%   BMI 26.78 kg/m   PROVIDERS: Rochel Brome, MD is PCP. Last visit 04/13/20. SOB improved following treatment of LLL PNA. Jyl Heinz, MD is cardiologist. Last visit 01/09/20. Six month follow-up planned.  LABS: Labs reviewed: Acceptable for surgery. (all labs ordered are listed, but only abnormal results are displayed)  Labs Reviewed  BASIC METABOLIC PANEL - Abnormal; Notable for the following components:      Result Value   Glucose, Bld 121 (*)    BUN 26 (*)    All other components within normal limits  HEMOGLOBIN A1C - Abnormal; Notable for the following components:   Hgb A1c MFr Bld 6.2 (*)    All other components within normal limits  SURGICAL PCR SCREEN  PROTIME-INR  CBC WITH DIFFERENTIAL/PLATELET  TYPE AND SCREEN     IMAGES: CT L-spine 04/25/20: IMPRESSION: 1. 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 Screw loosening bilaterally at L1 with pronounced lucency around the hardware. Bulging of the disc at L1-2, more to the right. Mild foraminal narrowing right more  than left which could conceivably affect the right L1 nerve. 2. No evidence of motion at the L2-3 level. Sufficient patency of the canal and foramina. 3. Solid union from L3 through L5. No hardware complication. No stenosis. 4. L5-S1: Minimal disc bulge and minimal facet osteoarthritis. No stenosis. 5. Mild bilateral sacroiliac osteoarthritis.   EKG: 03/15/20: NSR.Cannot rule out inferior infarct, age undetermined.   CV: LHC 08/03/19:  Prox Cx lesion is 30% stenosed.  Prox LAD to Mid LAD lesion is 40% stenosed.  Mid LAD lesion is 25% stenosed.  The left ventricular systolic function is normal.  LV end diastolic pressure is normal.  The left ventricular ejection fraction is 55-65% by visual estimate.  There is no aortic valve stenosis.  Nonobstructive CAD. Continue preventive therapy.   Nuclear stress 07/14/19:  Nuclear stress EF: 69%.  The left ventricular ejection fraction is hyperdynamic (>65%).  There was no ST segment deviation noted during stress.  This is a low risk study.  No evidence of ischemia or MI  Normal EF.  TTE 07/14/19: 1. Sigmoid septum noted.. Left ventricular ejection fraction, by  estimation, is 55 to 60%. The left ventricle has normal function. The left  ventricle has no regional wall motion abnormalities. Left ventricular  diastolic parameters are consistent with  Grade I diastolic dysfunction (impaired relaxation).  2. Right ventricular systolic function is normal. The  right ventricular  size is normal. There is normal pulmonary artery systolic pressure.  3. The mitral valve is normal in structure. No evidence of mitral valve  regurgitation. No evidence of mitral stenosis.  4. The aortic valve is normal in structure. Aortic valve regurgitation is  not visualized. No aortic stenosis is present.  5. 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  6. The inferior vena cava is normal in size with  greater than 50%  respiratory variability, suggesting right atrial pressure of 3 mmHg.      Past Medical History:  Diagnosis Date  . Angina pectoris ([place]) 08/02/2019  . Arthritis    on meds  . Atrial fibrillation ([place])   . Cancer ([place]) 10/2015   prostate cancer treated with radiation  . Cataract    sx-bilaterally  . Chest tightness 06/28/2019  . Colitis 11/22/2018  . 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 Depression 04/04/2019  . Diabetes mellitus due to underlying condition with unspecified complications ([place]) 7/61/9509  . Diabetes mellitus without complication ([place])    Type II  . Diarrhea 11/21/2018  . Dyslipidemia 06/17/2016  . Dyspnea on exertion 06/28/2019  . Dysrhythmia   . Essential hypertension 06/17/2016  . GERD (gastroesophageal reflux disease)    on meds  . Headache   . History of hiatal hernia   . History of kidney stones   . History of prostate cancer 05/20/2019  . Hypercholesteremia 04/04/2019  . Hyperlipemia   . Hypertension   . Impaired fasting blood sugar 05/20/2019  . Malignant neoplasm prostate ([place]) 05/20/2019  . Mixed hyperlipidemia 08/31/2019  . Paroxysmal atrial fibrillation ([place]) 04/04/2019  . PONV (postoperative nausea and vomiting)   . 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 S/P lumbar fusion 12/09/2019  . S/P lumbar spinal fusion 09/29/2013  . Sepsis ([place]) 11/22/2018    Past Surgical History:  Procedure Laterality Date  . anal fissures    . ANTERIOR CERVICAL DECOMP/DISCECTOMY FUSION N/A 03/04/2018   Procedure: Anterior Cervical Decompression Fusion - Cervical three-Cervical four - Cervical four-Cervical five;  Surgeon: Eustace Moore, MD;  Location: [facility];  Service: Neurosurgery;  Laterality: N/A;  . BACK SURGERY    . CARDIAC CATHETERIZATION     no PCI  . CARPAL TUNNEL RELEASE Left 03/04/2018   Procedure: Carpal Tunnel Release - left;   Surgeon: Eustace Moore, MD;  Location: [facility];  Service: Neurosurgery;  Laterality: Left;  . CHOLECYSTECTOMY    . COLONOSCOPY W/ POLYPECTOMY  08/30/2012   Colonic polyp status post polypectomy.  Pancolonic diverticulosis predominantly in the sigmoid colon. Small internal hemorrhoids. Moderate internal hemorhroids.   . ESOPHAGOGASTRODUODENOSCOPY  08/04/2016   Schatzkis ring status post esophageal dilatation. Small hiatal hernia. Mild gastritis.   Marland Kitchen EYE SURGERY     cataract removal bilateral eyes  . FOOT SURGERY     left heel  . HERNIA REPAIR    . LAMINECTOMY WITH POSTERIOR LATERAL ARTHRODESIS LEVEL 2 N/A 12/09/2019   Procedure: Laminectomy and Foraminotomy - Lumbar one-two, Lumbar two-three, posterolateral instrumented fusion Lumbar one-three, removal of instrumentation Lumbar three-five;  Surgeon: Eustace Moore, MD;  Location: [facility];  Service: Neurosurgery;  Laterality: N/A;  . LEFT HEART CATH AND CORONARY ANGIOGRAPHY N/A 08/03/2019   Procedure: LEFT HEART CATH AND CORONARY ANGIOGRAPHY;  Surgeon: Jettie Booze, MD;  Location: [facility];  Service: Cardiovascular;  Laterality: N/A;  . SHOULDER SURGERY     bilateral  . WISDOM TOOTH EXTRACTION      MEDICATIONS: . lisinopril (ZESTRIL) 10 MG tablet  . aspirin EC 81 MG tablet  . atorvastatin (LIPITOR) 40 MG tablet  . benzonatate (TESSALON) 200 MG capsule  . carboxymethylcellulose (REFRESH PLUS) 0.5 % SOLN  . cholecalciferol (VITAMIN D) 1000 UNITS tablet  . gabapentin (NEURONTIN) 600 MG tablet  . HYDROcodone-acetaminophen (NORCO) 10-325 MG tablet  . lisinopril (ZESTRIL) 20 MG tablet  . meloxicam (MOBIC) 15 MG tablet  . metFORMIN (GLUCOPHAGE) 500 MG tablet  . methocarbamol (ROBAXIN) 500 MG tablet  . metoCLOPramide (REGLAN) 10 MG tablet  . nitroGLYCERIN (NITROSTAT) 0.4 MG SL tablet  . Omega-3 Fatty Acids (FISH OIL) 1200 MG CAPS  . pantoprazole (PROTONIX) 40 MG tablet  . tamsulosin (FLOMAX) 0.4 MG CAPS capsule   . 0.9 %   sodium chloride infusion   ASA on hold.   Myra Gianotti, PA-C Surgical Short Stay/Anesthesiology [facility] Phone [phone] [facility] Phone [phone] 05/29/2020 5:00 PM

## 2020-05-29 NOTE — Progress Notes (Signed)
PCP - Dr. Tobie Poet Cardiologist - Dr. Geraldo Pitter  Chest x-ray - 03/15/20 EKG - 03/15/20 Stress Test -  ECHO - 07/14/19 Cardiac Cath - 08/03/19  Fasting Blood Sugar - pt gets CBG checked at PCP Checks Blood Sugar    Blood Thinner Instructions:  Aspirin Instructions: per pt, last dose ASA was back in March 2022 - he had stopped 2 weeks prior to when he was supposed to have this surgery last before it was rescheduled for 05/30/20    COVID TEST- n/a - see note by Dr. Rochel Brome - pt tested+ back in February  Pt had cough, but today he denies any cough or covid symptoms.    Anesthesia review: yes   Coronavirus Screening  Have you experienced the following symptoms:  Cough yes/no: No Fever (>100.39F)  yes/no: No Runny nose yes/no: No Sore throat yes/no: No Difficulty breathing/shortness of breath  yes/no: No  Have you or a family member traveled in the last 14 days and where? yes/no: No   If the patient indicates "YES" to the above questions, their PAT will be rescheduled to limit the exposure to others and, the surgeon will be notified. THE PATIENT WILL NEED TO BE ASYMPTOMATIC FOR 14 DAYS.   If the patient is not experiencing any of these symptoms, the PAT nurse will instruct them to NOT bring anyone with them to their appointment since they may have these symptoms or traveled as well.   Please remind your patients and families that hospital visitation restrictions are in effect and the importance of the restrictions.    Patient denies shortness of breath, fever, cough and chest pain at PAT appointment   All instructions explained to the patient, with a verbal understanding of the material. Patient agrees to go over the instructions while at home for a better understanding. Patient also instructed to self quarantine after being tested for COVID-19. The opportunity to ask questions was provided.

## 2020-05-30 ENCOUNTER — Inpatient Hospital Stay (HOSPITAL_COMMUNITY): Payer: Medicare PPO | Admitting: Vascular Surgery

## 2020-05-30 ENCOUNTER — Encounter (HOSPITAL_COMMUNITY): Payer: Self-pay | Admitting: Neurological Surgery

## 2020-05-30 ENCOUNTER — Inpatient Hospital Stay (HOSPITAL_COMMUNITY)
Admission: RE | Admit: 2020-05-30 | Discharge: 2020-06-02 | DRG: 455 | Disposition: A | Discharge status: Home / Self Care | Payer: Medicare PPO | Attending: Neurological Surgery | Admitting: Neurological Surgery

## 2020-05-30 ENCOUNTER — Encounter (HOSPITAL_COMMUNITY)
Admission: RE | Disposition: A | Discharge status: Home / Self Care | Payer: Self-pay | Source: Home / Self Care | Attending: Neurological Surgery

## 2020-05-30 ENCOUNTER — Inpatient Hospital Stay (HOSPITAL_COMMUNITY): Payer: Medicare PPO | Admitting: Physician Assistant

## 2020-05-30 ENCOUNTER — Inpatient Hospital Stay (HOSPITAL_COMMUNITY): Payer: Medicare PPO

## 2020-05-30 ENCOUNTER — Other Ambulatory Visit: Payer: Self-pay

## 2020-05-30 DIAGNOSIS — M1990 Unspecified osteoarthritis, unspecified site: Secondary | ICD-10-CM | POA: Diagnosis present

## 2020-05-30 DIAGNOSIS — Z79899 Other long term (current) drug therapy: Secondary | ICD-10-CM

## 2020-05-30 DIAGNOSIS — I2510 Atherosclerotic heart disease of native coronary artery without angina pectoris: Secondary | ICD-10-CM | POA: Diagnosis present

## 2020-05-30 DIAGNOSIS — Z7982 Long term (current) use of aspirin: Secondary | ICD-10-CM

## 2020-05-30 DIAGNOSIS — Z791 Long term (current) use of non-steroidal anti-inflammatories (NSAID): Secondary | ICD-10-CM | POA: Diagnosis not present

## 2020-05-30 DIAGNOSIS — M960 Pseudarthrosis after fusion or arthrodesis: Secondary | ICD-10-CM | POA: Diagnosis present

## 2020-05-30 DIAGNOSIS — K219 Gastro-esophageal reflux disease without esophagitis: Secondary | ICD-10-CM | POA: Diagnosis present

## 2020-05-30 DIAGNOSIS — I10 Essential (primary) hypertension: Secondary | ICD-10-CM | POA: Diagnosis present

## 2020-05-30 DIAGNOSIS — Z87891 Personal history of nicotine dependence: Secondary | ICD-10-CM | POA: Diagnosis not present

## 2020-05-30 DIAGNOSIS — Z419 Encounter for procedure for purposes other than remedying health state, unspecified: Secondary | ICD-10-CM

## 2020-05-30 DIAGNOSIS — Z8616 Personal history of COVID-19: Secondary | ICD-10-CM

## 2020-05-30 DIAGNOSIS — Z923 Personal history of irradiation: Secondary | ICD-10-CM | POA: Diagnosis not present

## 2020-05-30 DIAGNOSIS — I480 Paroxysmal atrial fibrillation: Secondary | ICD-10-CM | POA: Diagnosis present

## 2020-05-30 DIAGNOSIS — Z7984 Long term (current) use of oral hypoglycemic drugs: Secondary | ICD-10-CM

## 2020-05-30 DIAGNOSIS — E782 Mixed hyperlipidemia: Secondary | ICD-10-CM | POA: Diagnosis present

## 2020-05-30 DIAGNOSIS — Z981 Arthrodesis status: Secondary | ICD-10-CM | POA: Diagnosis not present

## 2020-05-30 DIAGNOSIS — E119 Type 2 diabetes mellitus without complications: Secondary | ICD-10-CM | POA: Diagnosis present

## 2020-05-30 DIAGNOSIS — M4326 Fusion of spine, lumbar region: Secondary | ICD-10-CM | POA: Diagnosis not present

## 2020-05-30 DIAGNOSIS — Z885 Allergy status to narcotic agent status: Secondary | ICD-10-CM

## 2020-05-30 DIAGNOSIS — Z8546 Personal history of malignant neoplasm of prostate: Secondary | ICD-10-CM

## 2020-05-30 HISTORY — PX: HARDWARE REMOVAL: SHX979

## 2020-05-30 HISTORY — PX: ANTERIOR LAT LUMBAR FUSION: SHX1168

## 2020-05-30 LAB — GLUCOSE, CAPILLARY
Glucose-Capillary: 110 mg/dL — ABNORMAL HIGH (ref 70–99)
Glucose-Capillary: 113 mg/dL — ABNORMAL HIGH (ref 70–99)

## 2020-05-30 SURGERY — ANTERIOR LATERAL LUMBAR FUSION 1 LEVEL
Anesthesia: General | Site: Back

## 2020-05-30 MED ORDER — THROMBIN 5000 UNITS EX SOLR
CUTANEOUS | Status: AC
  Filled 2020-05-30: qty 5000

## 2020-05-30 MED ORDER — OXYCODONE HCL 5 MG PO TABS: 5.0000 mg | ORAL_TABLET | Freq: Once | ORAL | Status: DC | PRN

## 2020-05-30 MED ORDER — SODIUM CHLORIDE 0.9 % IV SOLN
250.0000 mL | INTRAVENOUS | Status: DC
  Administered 2020-05-30: 250 mL via INTRAVENOUS

## 2020-05-30 MED ORDER — CELECOXIB 200 MG PO CAPS
200.0000 mg | ORAL_CAPSULE | Freq: Two times a day (BID) | ORAL | Status: DC
  Administered 2020-05-30 – 2020-06-02 (×6): 200 mg via ORAL
  Filled 2020-05-30 (×6): qty 1

## 2020-05-30 MED ORDER — LISINOPRIL 20 MG PO TABS
20.0000 mg | ORAL_TABLET | Freq: Every day | ORAL | Status: DC
  Administered 2020-05-30 – 2020-06-02 (×4): 20 mg via ORAL
  Filled 2020-05-30 (×3): qty 1

## 2020-05-30 MED ORDER — ORAL CARE MOUTH RINSE: 15.0000 mL | Freq: Once | OROMUCOSAL | Status: AC

## 2020-05-30 MED ORDER — LIDOCAINE HCL (CARDIAC) PF 100 MG/5ML IV SOSY
PREFILLED_SYRINGE | INTRAVENOUS | Status: DC | PRN
  Administered 2020-05-30: 25 mg via INTRAVENOUS

## 2020-05-30 MED ORDER — LACTATED RINGERS IV SOLN: INTRAVENOUS | Status: DC | PRN

## 2020-05-30 MED ORDER — MIDAZOLAM HCL 2 MG/2ML IJ SOLN: 0.5000 mg | Freq: Once | INTRAMUSCULAR | Status: DC | PRN

## 2020-05-30 MED ORDER — HYDROCODONE-ACETAMINOPHEN 10-325 MG PO TABS
1.0000 | ORAL_TABLET | ORAL | Status: DC | PRN
  Administered 2020-05-31 – 2020-06-02 (×5): 1 via ORAL
  Filled 2020-05-30 (×5): qty 1

## 2020-05-30 MED ORDER — SUGAMMADEX SODIUM 200 MG/2ML IV SOLN
INTRAVENOUS | Status: DC | PRN
  Administered 2020-05-30: 200 mg via INTRAVENOUS

## 2020-05-30 MED ORDER — SUFENTANIL CITRATE 50 MCG/ML IV SOLN
INTRAVENOUS | Status: AC
  Filled 2020-05-30: qty 1

## 2020-05-30 MED ORDER — SODIUM CHLORIDE 0.9% FLUSH: 3.0000 mL | INTRAVENOUS | Status: DC | PRN

## 2020-05-30 MED ORDER — PROPOFOL 500 MG/50ML IV EMUL
INTRAVENOUS | Status: DC | PRN
  Administered 2020-05-30: 100 ug/kg/min via INTRAVENOUS
  Administered 2020-05-30: 90 ug/kg/min via INTRAVENOUS

## 2020-05-30 MED ORDER — BUPIVACAINE HCL (PF) 0.25 % IJ SOLN
INTRAMUSCULAR | Status: DC | PRN
  Administered 2020-05-30: 7 mL
  Administered 2020-05-30: 10 mL

## 2020-05-30 MED ORDER — ONDANSETRON HCL 4 MG PO TABS: 4.0000 mg | ORAL_TABLET | Freq: Four times a day (QID) | ORAL | Status: DC | PRN

## 2020-05-30 MED ORDER — HYDROMORPHONE HCL 1 MG/ML IJ SOLN
0.2500 mg | INTRAMUSCULAR | Status: DC | PRN
  Administered 2020-05-30 (×2): 0.5 mg via INTRAVENOUS

## 2020-05-30 MED ORDER — PANTOPRAZOLE SODIUM 40 MG PO TBEC
40.0000 mg | DELAYED_RELEASE_TABLET | Freq: Every day | ORAL | Status: DC
  Administered 2020-05-30 – 2020-06-02 (×4): 40 mg via ORAL
  Filled 2020-05-30 (×3): qty 1

## 2020-05-30 MED ORDER — PROPOFOL 10 MG/ML IV BOLUS
INTRAVENOUS | Status: AC
  Filled 2020-05-30: qty 20

## 2020-05-30 MED ORDER — OXYCODONE HCL 5 MG/5ML PO SOLN: 5.0000 mg | Freq: Once | ORAL | Status: DC | PRN

## 2020-05-30 MED ORDER — PROPOFOL 10 MG/ML IV BOLUS
INTRAVENOUS | Status: DC | PRN
  Administered 2020-05-30: 130 mg via INTRAVENOUS

## 2020-05-30 MED ORDER — BUPIVACAINE HCL (PF) 0.25 % IJ SOLN
INTRAMUSCULAR | Status: AC
  Filled 2020-05-30: qty 30

## 2020-05-30 MED ORDER — MEPERIDINE HCL 25 MG/ML IJ SOLN
INTRAMUSCULAR | Status: AC
  Filled 2020-05-30: qty 1

## 2020-05-30 MED ORDER — THROMBIN 5000 UNITS EX SOLR
OROMUCOSAL | Status: DC | PRN
  Administered 2020-05-30: 5 mL via TOPICAL

## 2020-05-30 MED ORDER — MENTHOL 3 MG MT LOZG: 1.0000 | LOZENGE | OROMUCOSAL | Status: DC | PRN

## 2020-05-30 MED ORDER — HYDROMORPHONE HCL 1 MG/ML IJ SOLN
INTRAMUSCULAR | Status: AC
  Filled 2020-05-30: qty 1

## 2020-05-30 MED ORDER — GABAPENTIN 600 MG PO TABS
600.0000 mg | ORAL_TABLET | Freq: Every morning | ORAL | Status: DC
  Administered 2020-05-31 – 2020-06-02 (×3): 600 mg via ORAL
  Filled 2020-05-30 (×2): qty 1

## 2020-05-30 MED ORDER — SUCCINYLCHOLINE CHLORIDE 200 MG/10ML IV SOSY
PREFILLED_SYRINGE | INTRAVENOUS | Status: AC
  Filled 2020-05-30: qty 10

## 2020-05-30 MED ORDER — ONDANSETRON HCL 4 MG/2ML IJ SOLN
INTRAMUSCULAR | Status: DC | PRN
  Administered 2020-05-30: 4 mg via INTRAVENOUS

## 2020-05-30 MED ORDER — NITROGLYCERIN 0.4 MG SL SUBL: 0.4000 mg | SUBLINGUAL_TABLET | SUBLINGUAL | Status: DC | PRN

## 2020-05-30 MED ORDER — METHOCARBAMOL 500 MG PO TABS
500.0000 mg | ORAL_TABLET | Freq: Four times a day (QID) | ORAL | Status: DC | PRN
  Administered 2020-05-30 – 2020-06-02 (×6): 500 mg via ORAL
  Filled 2020-05-30 (×8): qty 1

## 2020-05-30 MED ORDER — METHOCARBAMOL 1000 MG/10ML IJ SOLN
500.0000 mg | Freq: Four times a day (QID) | INTRAVENOUS | Status: DC | PRN
  Filled 2020-05-30: qty 5

## 2020-05-30 MED ORDER — ASPIRIN EC 81 MG PO TBEC
81.0000 mg | DELAYED_RELEASE_TABLET | Freq: Every morning | ORAL | Status: DC
  Administered 2020-05-31 – 2020-06-01 (×2): 81 mg via ORAL
  Filled 2020-05-30 (×2): qty 1

## 2020-05-30 MED ORDER — PROMETHAZINE HCL 25 MG/ML IJ SOLN: 6.2500 mg | INTRAMUSCULAR | Status: DC | PRN

## 2020-05-30 MED ORDER — LACTATED RINGERS IV SOLN: INTRAVENOUS | Status: DC

## 2020-05-30 MED ORDER — SENNA 8.6 MG PO TABS
1.0000 | ORAL_TABLET | Freq: Two times a day (BID) | ORAL | Status: DC
  Administered 2020-05-30 – 2020-06-02 (×6): 8.6 mg via ORAL
  Filled 2020-05-30 (×6): qty 1

## 2020-05-30 MED ORDER — PROPOFOL 1000 MG/100ML IV EMUL
INTRAVENOUS | Status: AC
  Filled 2020-05-30: qty 100

## 2020-05-30 MED ORDER — CEFAZOLIN SODIUM-DEXTROSE 2-4 GM/100ML-% IV SOLN
2.0000 g | Freq: Three times a day (TID) | INTRAVENOUS | Status: AC
  Administered 2020-05-30 – 2020-05-31 (×2): 2 g via INTRAVENOUS
  Filled 2020-05-30 (×2): qty 100

## 2020-05-30 MED ORDER — SODIUM CHLORIDE 0.9% FLUSH
3.0000 mL | Freq: Two times a day (BID) | INTRAVENOUS | Status: DC
  Administered 2020-05-30 – 2020-06-01 (×4): 3 mL via INTRAVENOUS

## 2020-05-30 MED ORDER — CHLORHEXIDINE GLUCONATE 0.12 % MT SOLN
OROMUCOSAL | Status: AC
  Administered 2020-05-30: 15 mL via OROMUCOSAL
  Filled 2020-05-30: qty 15

## 2020-05-30 MED ORDER — PHENOL 1.4 % MT LIQD: 1.0000 | OROMUCOSAL | Status: DC | PRN

## 2020-05-30 MED ORDER — CHLORHEXIDINE GLUCONATE 0.12 % MT SOLN: 15.0000 mL | Freq: Once | OROMUCOSAL | Status: AC

## 2020-05-30 MED ORDER — MORPHINE SULFATE (PF) 2 MG/ML IV SOLN
2.0000 mg | INTRAVENOUS | Status: DC | PRN
  Administered 2020-05-30 – 2020-05-31 (×4): 2 mg via INTRAVENOUS
  Filled 2020-05-30 (×4): qty 1

## 2020-05-30 MED ORDER — 0.9 % SODIUM CHLORIDE (POUR BTL) OPTIME
TOPICAL | Status: DC | PRN
  Administered 2020-05-30: 1000 mL

## 2020-05-30 MED ORDER — ACETAMINOPHEN 650 MG RE SUPP: 650.0000 mg | RECTAL | Status: DC | PRN

## 2020-05-30 MED ORDER — POTASSIUM CHLORIDE IN NACL 20-0.9 MEQ/L-% IV SOLN
INTRAVENOUS | Status: DC
  Filled 2020-05-30 (×2): qty 1000

## 2020-05-30 MED ORDER — MEPERIDINE HCL 25 MG/ML IJ SOLN
6.2500 mg | INTRAMUSCULAR | Status: DC | PRN
  Administered 2020-05-30: 12.5 mg via INTRAVENOUS

## 2020-05-30 MED ORDER — DEXAMETHASONE SODIUM PHOSPHATE 10 MG/ML IJ SOLN
INTRAMUSCULAR | Status: DC | PRN
  Administered 2020-05-30: 10 mg via INTRAVENOUS

## 2020-05-30 MED ORDER — CEFAZOLIN SODIUM-DEXTROSE 2-3 GM-%(50ML) IV SOLR
INTRAVENOUS | Status: DC | PRN
  Administered 2020-05-30: 2 g via INTRAVENOUS

## 2020-05-30 MED ORDER — METFORMIN HCL 500 MG PO TABS
500.0000 mg | ORAL_TABLET | Freq: Every day | ORAL | Status: DC
  Administered 2020-05-31 – 2020-06-02 (×3): 500 mg via ORAL
  Filled 2020-05-30 (×3): qty 1

## 2020-05-30 MED ORDER — ROCURONIUM BROMIDE 10 MG/ML (PF) SYRINGE
PREFILLED_SYRINGE | INTRAVENOUS | Status: AC
  Filled 2020-05-30: qty 30

## 2020-05-30 MED ORDER — ACETAMINOPHEN 325 MG PO TABS: 650.0000 mg | ORAL_TABLET | ORAL | Status: DC | PRN

## 2020-05-30 MED ORDER — ONDANSETRON HCL 4 MG/2ML IJ SOLN
INTRAMUSCULAR | Status: AC
  Filled 2020-05-30: qty 4

## 2020-05-30 MED ORDER — ROCURONIUM 10MG/ML (10ML) SYRINGE FOR MEDFUSION PUMP - OPTIME
INTRAVENOUS | Status: DC | PRN
  Administered 2020-05-30: 30 mg via INTRAVENOUS

## 2020-05-30 MED ORDER — CEFAZOLIN SODIUM-DEXTROSE 2-4 GM/100ML-% IV SOLN
INTRAVENOUS | Status: AC
  Administered 2020-05-30: 2000 mg
  Filled 2020-05-30: qty 100

## 2020-05-30 MED ORDER — LIDOCAINE 2% (20 MG/ML) 5 ML SYRINGE
INTRAMUSCULAR | Status: AC
  Filled 2020-05-30: qty 10

## 2020-05-30 MED ORDER — METHOCARBAMOL 500 MG PO TABS
ORAL_TABLET | ORAL | Status: AC
  Administered 2020-05-30: 500 mg via ORAL
  Filled 2020-05-30: qty 1

## 2020-05-30 MED ORDER — TAMSULOSIN HCL 0.4 MG PO CAPS
0.4000 mg | ORAL_CAPSULE | Freq: Every day | ORAL | Status: DC
  Administered 2020-05-31 – 2020-06-01 (×2): 0.4 mg via ORAL
  Filled 2020-05-30 (×2): qty 1

## 2020-05-30 MED ORDER — ONDANSETRON HCL 4 MG/2ML IJ SOLN
4.0000 mg | Freq: Four times a day (QID) | INTRAMUSCULAR | Status: DC | PRN
  Administered 2020-05-31: 4 mg via INTRAVENOUS
  Filled 2020-05-30: qty 2

## 2020-05-30 MED ORDER — SUFENTANIL CITRATE 50 MCG/ML IV SOLN
INTRAVENOUS | Status: DC | PRN
  Administered 2020-05-30: 5 ug via INTRAVENOUS
  Administered 2020-05-30: 10 ug via INTRAVENOUS
  Administered 2020-05-30: 5 ug via INTRAVENOUS

## 2020-05-30 SURGICAL SUPPLY — 73 items
ADH SKN CLS APL DERMABOND .7 (GAUZE/BANDAGES/DRESSINGS) ×4
APL SKNCLS STERI-STRIP NONHPOA (GAUZE/BANDAGES/DRESSINGS) ×2
BAND INSRT 18 STRL LF DISP RB (MISCELLANEOUS) ×2
BAND RUBBER #18 3X1/16 STRL (MISCELLANEOUS) ×5 IMPLANT
BATTALION LLIF ITRADISCAL SHIM (MISCELLANEOUS) ×3
BENZOIN TINCTURE PRP APPL 2/3 (GAUZE/BANDAGES/DRESSINGS) ×7 IMPLANT
BLADE CLIPPER SURG (BLADE) ×2 IMPLANT
BUR CARBIDE MATCH 3.0 (BURR) ×3 IMPLANT
CANISTER SUCT 3000ML PPV (MISCELLANEOUS) ×3 IMPLANT
CLIP SPRING STIM LLIF SAFEOP (CLIP) ×1 IMPLANT
COVER BACK TABLE 24X17X13 BIG (DRAPES) IMPLANT
COVER WAND RF STERILE (DRAPES) ×6 IMPLANT
DERMABOND ADVANCED (GAUZE/BANDAGES/DRESSINGS) ×2
DERMABOND ADVANCED .7 DNX12 (GAUZE/BANDAGES/DRESSINGS) ×4 IMPLANT
DIFFUSER DRILL AIR PNEUMATIC (MISCELLANEOUS) ×4 IMPLANT
DILATOR INSULATED LLIF 8-13-18 (NEUROSURGERY SUPPLIES) ×1 IMPLANT
DRAPE C-ARM 42X72 X-RAY (DRAPES) ×3 IMPLANT
DRAPE C-ARMOR (DRAPES) ×3 IMPLANT
DRAPE LAPAROTOMY 100X72X124 (DRAPES) ×6 IMPLANT
DRAPE MICROSCOPE LEICA (MISCELLANEOUS) ×2 IMPLANT
DRAPE SURG 17X23 STRL (DRAPES) ×15 IMPLANT
DRSG OPSITE POSTOP 4X6 (GAUZE/BANDAGES/DRESSINGS) ×1 IMPLANT
DURAPREP 26ML APPLICATOR (WOUND CARE) ×6 IMPLANT
ELECT KIT SAFEOP SSEP/SURF (KITS) ×3
ELECT REM PT RETURN 9FT ADLT (ELECTROSURGICAL) ×6
ELECTRODE KT SAFEOP SSEP/SURF (KITS) IMPLANT
ELECTRODE REM PT RTRN 9FT ADLT (ELECTROSURGICAL) ×4 IMPLANT
EVACUATOR 1/8 PVC DRAIN (DRAIN) ×1 IMPLANT
GAUZE 4X4 16PLY RFD (DISPOSABLE) IMPLANT
GLOVE BIO SURGEON STRL SZ7 (GLOVE) IMPLANT
GLOVE BIO SURGEON STRL SZ8 (GLOVE) ×6 IMPLANT
GLOVE SURG ENC MOIS LTX SZ8 (GLOVE) ×4 IMPLANT
GLOVE SURG UNDER POLY LF SZ7 (GLOVE) IMPLANT
GOWN STRL REUS W/ TWL LRG LVL3 (GOWN DISPOSABLE) IMPLANT
GOWN STRL REUS W/ TWL XL LVL3 (GOWN DISPOSABLE) ×2 IMPLANT
GOWN STRL REUS W/TWL 2XL LVL3 (GOWN DISPOSABLE) IMPLANT
GOWN STRL REUS W/TWL LRG LVL3 (GOWN DISPOSABLE) ×12
GOWN STRL REUS W/TWL XL LVL3 (GOWN DISPOSABLE) ×9
GRAFT TRIN ELITE MED MUSC TRAN (Graft) ×1 IMPLANT
GRAFT TRINITY ELITE LGE HUMAN (Tissue) ×1 IMPLANT
HEMOSTAT POWDER KIT SURGIFOAM (HEMOSTASIS) ×1 IMPLANT
KIT BASIN OR (CUSTOM PROCEDURE TRAY) ×6 IMPLANT
KIT TURNOVER KIT B (KITS) ×6 IMPLANT
KNIFE ANNULOTOMY GREY RETRACT (ORTHOPEDIC DISPOSABLE SUPPLIES) ×1 IMPLANT
LIF ILLUMINATION SYSTEM STERIL (SYSTAGENIX WOUND MANAGEMENT) ×3
MARKER SKIN DUAL TIP RULER LAB (MISCELLANEOUS) ×1 IMPLANT
NDL HYPO 25X1 1.5 SAFETY (NEEDLE) ×4 IMPLANT
NDL SPNL 20GX3.5 QUINCKE YW (NEEDLE) IMPLANT
NEEDLE HYPO 25X1 1.5 SAFETY (NEEDLE) ×6 IMPLANT
NEEDLE SPNL 20GX3.5 QUINCKE YW (NEEDLE) IMPLANT
NS IRRIG 1000ML POUR BTL (IV SOLUTION) ×6 IMPLANT
PACK LAMINECTOMY NEURO (CUSTOM PROCEDURE TRAY) ×6 IMPLANT
PAD ARMBOARD 7.5X6 YLW CONV (MISCELLANEOUS) ×9 IMPLANT
PLATE LIF AMP 2/SCRW 04/15 (Plate) ×1 IMPLANT
PROBE BALL TIP LLIF SAFEOP (NEUROSURGERY SUPPLIES) ×1 IMPLANT
ROD LORD LIPPED TI 5.5X45 (Rod) ×2 IMPLANT
SCREW LIF AMP 5.5X45 BONE X2 (Screw) ×1 IMPLANT
SET SCREW (Screw) ×12 IMPLANT
SET SCREW SPNE (Screw) IMPLANT
SHIM ITRADISCAL BATTALION LLIF (MISCELLANEOUS) IMPLANT
SPACER IDENTITI PS 8X22X50 0D (Spacer) ×1 IMPLANT
SPONGE LAP 4X18 RFD (DISPOSABLE) IMPLANT
SPONGE SURGIFOAM ABS GEL SZ50 (HEMOSTASIS) ×2 IMPLANT
STRIP CLOSURE SKIN 1/2X4 (GAUZE/BANDAGES/DRESSINGS) ×5 IMPLANT
SUT VIC AB 0 CT1 18XCR BRD8 (SUTURE) ×4 IMPLANT
SUT VIC AB 0 CT1 8-18 (SUTURE) ×6
SUT VIC AB 2-0 CP2 18 (SUTURE) ×6 IMPLANT
SUT VIC AB 3-0 SH 8-18 (SUTURE) ×7 IMPLANT
SYSTEM ILLUMINATION LIF STERIL (SYSTAGENIX WOUND MANAGEMENT) IMPLANT
TOWEL GREEN STERILE (TOWEL DISPOSABLE) ×6 IMPLANT
TOWEL GREEN STERILE FF (TOWEL DISPOSABLE) ×6 IMPLANT
TRAY FOLEY MTR SLVR 16FR STAT (SET/KITS/TRAYS/PACK) ×3 IMPLANT
WATER STERILE IRR 1000ML POUR (IV SOLUTION) ×6 IMPLANT

## 2020-05-30 NOTE — Op Note (Signed)
05/30/2020  5:26 PM  PATIENT:  [name]  80 y.o. male  PRE-OPERATIVE DIAGNOSIS:  1.  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  Loosening of instrumentation L1, 3.  Back and leg pain  POST-OPERATIVE DIAGNOSIS:  same  PROCEDURE:  1.  Anterior lateral retroperitoneal interbody fusion L1-2 utilizing a porous titanium interbody cage packed with morselized allograft, 2.  Lateral lumbar plating L1-2 with a Alphatec plate, 3.  Posterior lumbar reexploration with exploration of fusion L1-2 and L2-3 to confirm pseudoarthrosis, 3.  Removal of instrumentation L1-3 with replacement of instrumentation L2-3 utilizing Alphatec instrumentation, 4.  Posterior lateral arthrodesis L1-L3 bilaterally utilizing morselized allograft  SURGEON:  Sherley Bounds, MD  ASSISTANTS: Glenford Peers FNP  ANESTHESIA:   General  EBL: 100 ml  Total I/O In: 100 [IV Piggyback:100] Out: 300 [Urine:200; Blood:100]  BLOOD ADMINISTERED: none  DRAINS: Medium Hemovac  SPECIMEN:  none  INDICATION FOR PROCEDURE: This patient presented with severe back and right anterior thigh pain. Imaging showed loosening of instrumentation L1 with pseudoarthrosis L1-2. The patient tried conservative measures without relief. Pain was debilitating. Recommended anterior lateral retroperitoneal interbody fusion L1-2 with revision of posterior instrumentation with redo posterior lateral fusion L1-L3. Patient understood the risks, benefits, and alternatives and potential outcomes and wished to proceed.  PROCEDURE DETAILS: The patient was brought to the operating room and after induction of adequate generalized endotracheal anesthesia, the patient was positioned in the right lateral decubitus position exposing the left side. The patient was positioned in the typical XLIF fashion and taped into position and trajectories were confirmed with AP lateral fluoroscopy. The skin was cleaned and then prepped with DuraPrep and draped in the usual sterile fashion. 5 cc of  local anesthesia was injected. A lateral incision was made over the appropriate L1-2 disc space and 11th rib and a incision was made posterior lateral to this just under the 12th rib. Blunt finger dissection was used to enter the retroperitoneal space. I could palpate the psoas musculature as well as the anterior face of the transverse process and the undersurface of the 11th and 12th ribs, and I could feel the fatty tissues of the retroperitoneum. I swept my finger up to the lateral incision and passed my first dilator down between the 11th and 12th rib to the psoas musculature utilizing my index finger. We then used EMG monitoring to monitor our dilator. We checked our position with fluoroscopy and then placed a K wire into the disc space, and then used sequential dilators until our final retractor was in place. We then positioned it utilizing AP lateral fluoroscopy, and used our ball probe to make sure there were no neural structures within the working channel. I then placed the intradiscal shim, and opened my retractor further. The annulus was then incised and the discectomy was done with pituitaries. The disc material was removed from the endplates with a Cobb elevator and the opposite annulus was opened and released. I then used a series of scrapers and shavers to prepare the endplates, taking care not to violate the endplates. I then placed my 6 mm by 18 mm paddle across the disc space to further open opposite annulus and I checked the trajectory with AP and lateral fluoroscopy. I then used sequential trials to determine the correct size cage. The 8 x 22 mm trial fit the best, so a corresponding PTI cage was selected and packed with morcellized allograft. Using sliders it was then tapped gently into the disc space utilizing AP fluoroscopy until it  was appropriately positioned.  I then turned my attention to the placement of the lateral plate at U5-4.  A trial was positioned and then the appropriate size plate  was placed on an inserter and put down over the lateral edge of the interbody cage and attains were put down into the cage and the plate was screwed down onto the lateral edge of the cage.  Then used a 45 mm awl and passed this through the L1 and L2 holes in the plate.  I then placed a 5.5 by 45 mm screws into L1 and L2 and locked these to position in the plate and then further tightened the screw into the graft.  I then checked this with AP and lateral fluoroscopy.  I then irrigated with saline solution containing bacitracin and dried any bleeding points. I checked my final construct with AP lateral fluoroscopy, removed my retractor, and closed the incisions with layers of 0 Vicryl in the fascia, 2-0 Vicryl in the subcutaneous tissues, and 3-0 Vicryl in the subcuticular tissues. The skin was closed with Dermabond.   The drapes were then removed and the patient was turned into the prone position on chest rolls, all pressure points were padded. The lumbar region was then prepped with Betadine scrub and DuraPrep and draped in usual sterile fashion and 5 cc of local anesthesia was injected. A dorsal midline incision was made and carried down to the lumbosacral fascia. The fascia was opened and the paraspinous musculature was taken a subperiostel fashion to expose the previously placed instrumentation from L1-L3.  We remove the locking caps and the rods.  The L1 screws were loose and were removed.  The L2 and L3 screws seem to have excellent purchase.  We dissected out over the transverse processes and found some bone that was placed at the time of the previous posterolateral fusion at L1-2 and significantly more bone at L2-3.  We pulled on the L2 and L3 screws to see if they moved in unison.  Explored the fusion from L1-L3 on both sides.. The transverse processes of L1-L2 and L3 were then identified and then decorticated with a high-speed drill and morcellized allograft was placed in order to perform a posterior  fusion from L1-L3 bilaterally.  We then placed lordotic rods into the multiaxial screw heads of the L2 and L3 screws bilaterally.  We locked these into position with the locking caps and antitorque device.  The wound was irrigated, a medium Hemovac drain was placed, and the wound was then closed in layers of 0 Vicryl in the fascia, 2-0 Vicryl in the subcutaneous tissues, and 3-0 Vicryl in the subcuticular tissues. The skin was closed with benzoin and Steri-Strips. A sterile dressing was applied. The patient was then awakened from general anesthesia and transported to the recovery room in stable condition. At the end of the procedure all sponge, needle, and instrument counts were correct.    PLAN OF CARE: Admit to inpatient   PATIENT DISPOSITION:  PACU - hemodynamically stable.   Delay start of Pharmacological VTE agent (>24hrs) due to surgical blood loss or risk of bleeding:  yes

## 2020-05-30 NOTE — Progress Notes (Signed)
Received patient from PACU. Alert and oriented x 4. Neuro assessment performed by both the receiving RN and PACU RN.Honeycomb dressing was noted to have little drainage,and marked. Patient settled in his room, family allowed to be with patient till 20:30.

## 2020-05-30 NOTE — Anesthesia Postprocedure Evaluation (Signed)
Anesthesia Post Note  Patient: [name]  Procedure(s) Performed: Anterior Lateral Lumbar Fusion Lumbar One-Two with Lateral Plate Removal of pedicle screws Lumbar One and Posterior Lateral Fusion Lumbar One-Lumbar Three (N/A ) HARDWARE REMOVAL (N/A Back)     Patient location during evaluation: PACU Anesthesia Type: General Level of consciousness: awake and alert Pain control: continuing to provide analgetic meds. Vital Signs Assessment: post-procedure vital signs reviewed and stable Respiratory status: spontaneous breathing, nonlabored ventilation, respiratory function stable and patient connected to nasal cannula oxygen Cardiovascular status: blood pressure returned to baseline and stable Postop Assessment: no apparent nausea or vomiting Anesthetic complications: no   No complications documented.  Last Vitals:  Vitals:   05/30/20 1745 05/30/20 1755  BP: 130/78   Pulse: 76 74  Resp: 18   Temp:    SpO2: 95%     Last Pain:  Vitals:   05/30/20 1142  TempSrc: Oral  PainSc: 8                  Danett Palazzo,E. Gini Caputo

## 2020-05-30 NOTE — H&P (Signed)
Subjective: Patient is a 80 y.o. 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 Onset of symptoms was several weeks ago, rapidly worsening since that time.  The pain is rated severe, unremitting, and is located at the across the lower back and radiates to right lower extremity. The pain is described as aching and occurs all day. The symptoms have been progressive. Symptoms are exacerbated by exercise and standing. 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 Angina pectoris ([place]) 08/02/2019  . Arthritis    on meds  . Atrial fibrillation ([place])   . Cancer ([place]) 10/2015   prostate cancer treated with radiation  . Cataract    sx-bilaterally  . Chest tightness 06/28/2019  . Colitis 11/22/2018  . 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 Depression 04/04/2019  . Diabetes mellitus due to underlying condition with unspecified complications ([place]) 3/41/9379  . Diabetes mellitus without complication ([place])    Type II  . Diarrhea 11/21/2018  . Dyslipidemia 06/17/2016  . Dyspnea on exertion 06/28/2019  . Dysrhythmia   . Essential hypertension 06/17/2016  . GERD (gastroesophageal reflux disease)    on meds  . Headache   . History of hiatal hernia   . History of kidney stones   . History of prostate cancer 05/20/2019  . Hypercholesteremia 04/04/2019  . Hyperlipemia   . Hypertension   . Impaired fasting blood sugar 05/20/2019  . Malignant neoplasm prostate ([place]) 05/20/2019  . Mixed hyperlipidemia 08/31/2019  . Paroxysmal atrial fibrillation ([place]) 04/04/2019  . PONV (postoperative nausea and vomiting)   . 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 S/P lumbar fusion 12/09/2019  . S/P lumbar spinal fusion 09/29/2013  . Sepsis ([place]) 11/22/2018    Past Surgical History:  Procedure Laterality Date  . anal fissures    . ANTERIOR CERVICAL  DECOMP/DISCECTOMY FUSION N/A 03/04/2018   Procedure: Anterior Cervical Decompression Fusion - Cervical three-Cervical four - Cervical four-Cervical five;  Surgeon: Eustace Moore, MD;  Location: [facility];  Service: Neurosurgery;  Laterality: N/A;  . BACK SURGERY    . CARDIAC CATHETERIZATION     no PCI  . CARPAL TUNNEL RELEASE Left 03/04/2018   Procedure: Carpal Tunnel Release - left;  Surgeon: Eustace Moore, MD;  Location: [facility];  Service: Neurosurgery;  Laterality: Left;  . CHOLECYSTECTOMY    . COLONOSCOPY W/ POLYPECTOMY  08/30/2012   Colonic polyp status post polypectomy.  Pancolonic diverticulosis predominantly in the sigmoid colon. Small internal hemorrhoids. Moderate internal hemorhroids.   . ESOPHAGOGASTRODUODENOSCOPY  08/04/2016   Schatzkis ring status post esophageal dilatation. Small hiatal hernia. Mild gastritis.   Marland Kitchen EYE SURGERY     cataract removal bilateral eyes  . FOOT SURGERY     left heel  . HERNIA REPAIR    . LAMINECTOMY WITH POSTERIOR LATERAL ARTHRODESIS LEVEL 2 N/A 12/09/2019   Procedure: Laminectomy and Foraminotomy - Lumbar one-two, Lumbar two-three, posterolateral instrumented fusion Lumbar one-three, removal of instrumentation Lumbar three-five;  Surgeon: Eustace Moore, MD;  Location: [facility];  Service: Neurosurgery;  Laterality: N/A;  . LEFT HEART CATH AND CORONARY ANGIOGRAPHY N/A 08/03/2019   Procedure: LEFT HEART CATH AND CORONARY ANGIOGRAPHY;  Surgeon: Jettie Booze, MD;  Location: [facility];  Service: Cardiovascular;  Laterality: N/A;  . SHOULDER SURGERY     bilateral  . WISDOM TOOTH EXTRACTION  Prior to Admission medications   Medication Sig Start Date End Date Taking? Authorizing Provider  atorvastatin (LIPITOR) 40 MG tablet TAKE 1 TABLET BY MOUTH EVERY DAY *NEW DOSE 05/14/20  Yes Marge Duncans, PA-C  carboxymethylcellulose (REFRESH PLUS) 0.5 % SOLN Place 1 drop into both eyes 3 (three) times daily as needed (dry/irritated eyes).   Yes [provider]  gabapentin (NEURONTIN) 600 MG tablet TAKE 1 TABLET BY MOUTH THREE TIMES A DAY Patient taking differently: Take by mouth in the morning. 02/26/20  Yes Cox, Kirsten, MD  HYDROcodone-acetaminophen [facility]) 10-325 MG tablet Take 1 tablet by mouth daily as needed for severe pain. 11/10/19  Yes [provider]  lisinopril (ZESTRIL) 10 MG tablet TAKE 2 TABLETS BY MOUTH EVERY DAY 05/15/20  Yes Revankar, Reita Cliche, MD  lisinopril (ZESTRIL) 20 MG tablet Take 1 tablet (20 mg total) by mouth daily. Patient taking differently: Take 20 mg by mouth in the morning. 08/05/19  Yes Revankar, Reita Cliche, MD  meloxicam (MOBIC) 15 MG tablet Take 15 mg by mouth in the morning.   Yes [provider]  metFORMIN (GLUCOPHAGE) 500 MG tablet TAKE 1 TABLET BY MOUTH EVERY DAY WITH BREAKFAST Patient taking differently: Take 500 mg by mouth daily with breakfast. 04/17/20  Yes Marge Duncans, PA-C  methocarbamol (ROBAXIN) 500 MG tablet TAKE 1 TABLET BY MOUTH EVERY 6 HOURS AS NEEDED FOR MUSCLE SPASMS. 05/14/20  Yes Marge Duncans, PA-C  Omega-3 Fatty Acids (FISH OIL) 1200 MG CAPS Take 1,200 mg by mouth in the morning.   Yes [provider]  pantoprazole (PROTONIX) 40 MG tablet TAKE 1 TABLET BY MOUTH EVERY DAY Patient taking differently: Take 40 mg by mouth daily. 04/18/20  Yes Cox, Kirsten, MD  tamsulosin (FLOMAX) 0.4 MG CAPS capsule Take 0.4 mg by mouth daily after supper.  01/27/17  Yes [provider]  aspirin EC 81 MG tablet Take 1 tablet (81 mg total) by mouth daily. Patient taking differently: Take 81 mg by mouth in the morning. 11/11/19   Cox, Elnita Maxwell, MD  benzonatate (TESSALON) 200 MG capsule Take 1 capsule (200 mg total) by mouth 3 (three) times daily as needed for cough. Patient not taking: Reported on 05/09/2020 03/15/20   CoxElnita Maxwell, MD  cholecalciferol (VITAMIN D) 1000 UNITS tablet Take 1,000 Units by mouth in the morning.    [provider]  metoCLOPramide (REGLAN) 10 MG tablet  Take 1 tablet (10 mg total) by mouth every 6 (six) hours as needed for nausea, vomiting or refractory nausea / vomiting (hiccups). Patient not taking: No sig reported 12/15/19   Cox, Elnita Maxwell, MD  nitroGLYCERIN (NITROSTAT) 0.4 MG SL tablet Place 1 tablet (0.4 mg total) under the tongue every 5 (five) minutes x 3 doses as needed for chest pain. 06/28/19   Revankar, Reita Cliche, MD   Allergies  Allergen Reactions  . Oxycontin [Oxycodone] Other (See Comments)    loopy    Social History   Tobacco Use  . Smoking status: Former Smoker    Packs/day: 0.50    Years: 35.00    Pack years: 17.50    Types: Cigarettes    Quit date: 02/10/1993    Years since quitting: 27.3  . Smokeless tobacco: Former Systems developer    Types: [place] date: 02/10/2006  Substance Use Topics  . Alcohol use: Not Currently    Alcohol/week: 1.0 standard drink    Types: 1 Cans of beer per week    Comment: occ  Family History  Problem Relation Age of Onset  . Colon cancer Neg Hx   . Esophageal cancer Neg Hx   . Rectal cancer Neg Hx   . Stomach cancer Neg Hx   . Colon polyps Neg Hx      Review of Systems  Positive ROS: Negative  All other systems have been reviewed and were otherwise negative with the exception of those mentioned in the HPI and as above.  Objective: Vital signs in last 24 hours: Temp:  [98.5 F (36.9 C)] 98.5 F (36.9 C) (04/20 1142) Pulse Rate:  [76] 76 (04/20 1142) Resp:  [18] 18 (04/20 1142) BP: (154)/(87) 154/87 (04/20 1142) SpO2:  [96 %] 96 % (04/20 1142) Weight:  [83.5 kg] 83.5 kg (04/20 1142)  General Appearance: Alert, cooperative, no distress, appears stated age Head: Normocephalic, without obvious abnormality, atraumatic Eyes: PERRL, conjunctiva/corneas clear, EOM's intact    Neck: Supple, symmetrical, trachea midline Back: Symmetric, no curvature, ROM normal, no CVA tenderness Lungs:  respirations unlabored Heart: Regular rate and rhythm Abdomen: Soft, non-tender Extremities:  Extremities normal, atraumatic, no cyanosis or edema Pulses: 2+ and symmetric all extremities Skin: Skin color, texture, turgor normal, no rashes or lesions  NEUROLOGIC:   Mental status: Alert and oriented x4,  no aphasia, good attention span, fund of knowledge, and memory Motor Exam - grossly normal Sensory Exam - grossly normal Reflexes: 1+ Coordination - grossly normal Gait - grossly normal Balance - grossly normal Cranial Nerves: I: smell Not tested  II: visual acuity  OS: nl    OD: nl  II: visual fields Full to confrontation  II: pupils Equal, round, reactive to light  III,VII: ptosis None  III,IV,VI: extraocular muscles  Full ROM  V: mastication Normal  V: facial light touch sensation  Normal  V,VII: corneal reflex  Present  VII: facial muscle function - upper  Normal  VII: facial muscle function - lower Normal  VIII: hearing Not tested  IX: soft palate elevation  Normal  IX,X: gag reflex Present  XI: trapezius strength  5/5  XI: sternocleidomastoid strength 5/5  XI: neck flexion strength  5/5  XII: tongue strength  Normal    Data Review Lab Results  Component Value Date   WBC 7.0 05/29/2020   HGB 15.5 05/29/2020   HCT 45.6 05/29/2020   MCV 90.1 05/29/2020   PLT 194 05/29/2020   Lab Results  Component Value Date   NA 140 05/29/2020   K 4.9 05/29/2020   CL 106 05/29/2020   CO2 24 05/29/2020   BUN 26 (H) 05/29/2020   CREATININE 1.03 05/29/2020   GLUCOSE 121 (H) 05/29/2020   Lab Results  Component Value Date   INR 0.9 05/29/2020    Assessment/Plan:  Estimated body mass index is 25.66 kg/m as calculated from the following:   Height as of this encounter: 5\' 11"  (1.803 m).   Weight as of this encounter: 83.5 kg. Patient admitted for anterior lateral retroperitoneal interbody fusion L1-2, possibly L2-3, with posterior lumbar instrumentation removal and redo posterior lateral fusion L1-L2 3. Patient has failed a reasonable attempt at conservative  therapy.  I explained the condition and procedure to the patient and answered any questions.  Patient wishes to proceed with procedure as planned. Understands risks/ benefits and typical outcomes of procedure.   Eustace Moore 05/30/2020 12:59 PM

## 2020-05-30 NOTE — Anesthesia Procedure Notes (Signed)
Procedure Name: Intubation Date/Time: 05/30/2020 2:09 PM Performed by: Claris Che, CRNA Pre-anesthesia Checklist: Patient identified, Emergency Drugs available, Suction available and Patient being monitored Patient Re-evaluated:Patient Re-evaluated prior to induction Oxygen Delivery Method: Circle System Utilized Preoxygenation: Pre-oxygenation with 100% oxygen Induction Type: IV induction Ventilation: Mask ventilation without difficulty Laryngoscope Size: Mac and 4 Grade View: Grade I Tube type: Oral Tube size: 7.5 mm Number of attempts: 1 Airway Equipment and Method: Stylet Placement Confirmation: ETT inserted through vocal cords under direct vision,  positive ETCO2 and breath sounds checked- equal and bilateral Secured at: 23 cm Tube secured with: Tape Dental Injury: Teeth and Oropharynx as per pre-operative assessment

## 2020-05-30 NOTE — Transfer of Care (Signed)
Immediate Anesthesia Transfer of Care Note  Patient: [name]  Procedure(s) Performed: Anterior Lateral Lumbar Fusion Lumbar One-Two with Lateral Plate Removal of pedicle screws Lumbar One and Posterior Lateral Fusion Lumbar One-Lumbar Three (N/A ) HARDWARE REMOVAL (N/A Back)  Patient Location: PACU  Anesthesia Type:General  Level of Consciousness: drowsy and patient cooperative  Airway & Oxygen Therapy: Patient Spontanous Breathing and Patient connected to face mask oxygen  Post-op Assessment: Report given to RN and Post -op Vital signs reviewed and stable  Post vital signs: Reviewed and stable  Last Vitals:  Vitals Value Taken Time  BP 130/78   Temp    Pulse 79 05/30/20 1741  Resp 16 05/30/20 1741  SpO2 99 % 05/30/20 1741  Vitals shown include unvalidated device data.  Last Pain:  Vitals:   05/30/20 1142  TempSrc: Oral  PainSc: 8       Patients Stated Pain Goal: 3 (22/41/14 6431)  Complications: No complications documented.

## 2020-05-31 NOTE — Evaluation (Signed)
Occupational Therapy Evaluation Patient Details Name: [name] MRN: [id number] DOB: Jun 02, 1940 Today's Date: 05/31/2020    History of Present Illness Pt is an 80 y.o. 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 S/p ALIF L1-2 and removal of instrumentation L1-2 with replacement of instrumentation L2-3 on 4/20. PMH: s/p lumbar fusion, s/p cervical fusion, paroxysmal a-fib, malignant neoplasm prostate, HTN, dyspnea on exertion, DM, depression, CAD, cataract, arthritis, and angina pectoris.   Clinical Impression   Pt admitted with above. He demonstrates the below listed deficits and will benefit from continued OT to maximize safety and independence with BADLs.   Pt presents to OT with increased pain, decreased activity tolerance, decreased knowledge of precautions.  He currently requires set up - max A for ADLs.  He lives with his wife and was mod I - min A for ADLs.  Will follow acutely.       Follow Up Recommendations  No OT follow up;Supervision/Assistance - 24 hour    Equipment Recommendations  None recommended by OT    Recommendations for Other Services       Precautions / Restrictions Precautions Precautions: Back;Fall Precaution Booklet Issued: Yes (comment) Precaution Comments: Hemovac; Reviewed precautions.  He was able to recall precautions with min cues Required Braces or Orthoses: Spinal Brace Spinal Brace: Lumbar corset;Applied in sitting position Restrictions Weight Bearing Restrictions: No      Mobility Bed Mobility Overal bed mobility: Needs Assistance Bed Mobility: Rolling;Sidelying to Sit;Sit to Sidelying Rolling: Min guard Sidelying to sit: Min guard     Sit to sidelying: Min assist General bed mobility comments: Pt demonstrates good technique.  He requires assist to lift trunk    Transfers Overall transfer level: Needs assistance Equipment used:  Rolling walker (2 wheeled) Transfers: Sit to/from [facility] Sit to Stand: Min assist Stand pivot transfers: Min guard       General transfer comment: minimal assist to power up into standing    Balance Overall balance assessment: Mild deficits observed, not formally tested                                         ADL either performed or assessed with clinical judgement   ADL Overall ADL's : Needs assistance/impaired Eating/Feeding: Independent   Grooming: Wash/dry face;Wash/dry hands;Oral care;Set up;Supervision/safety;Sitting   Upper Body Bathing: Set up;Supervision/ safety;Sitting   Lower Body Bathing: Maximal assistance;Sit to/from stand   Upper Body Dressing : Minimal assistance;Sitting   Lower Body Dressing: Total assistance;Sit to/from stand   Toilet Transfer: Min guard;Stand-pivot;BSC;RW   Toileting- Water quality scientist and Hygiene: Minimal assistance;Sit to/from stand       Functional mobility during ADLs: Minimal assistance;Rolling walker General ADL Comments: Pt unable to perform Figure 4     Vision Baseline Vision/History: Wears glasses Wears Glasses: At all times Patient Visual Report: No change from baseline       Perception     Praxis      Pertinent Vitals/Pain Pain Assessment: 0-10 Pain Score: 6  Pain Location: back; surgical site Pain Descriptors / Indicators: Grimacing;Discomfort;Guarding;Operative site guarding Pain Intervention(s): Monitored during session;Repositioned;Premedicated before session;Limited activity within patient's tolerance     Hand Dominance Right   Extremity/Trunk Assessment Upper Extremity Assessment Upper Extremity Assessment: Generalized weakness   Lower Extremity Assessment Lower Extremity Assessment: Defer to  PT evaluation RLE Deficits / Details: grossly symmetrical strength to L leg with MMT scores of 4 to 5 grossly bil RLE Sensation:  (denies numbness/tingling) RLE  Coordination: decreased fine motor;decreased gross motor (possible dysdiadachokinesia noted)   Cervical / Trunk Assessment Cervical / Trunk Assessment: Other exceptions Cervical / Trunk Exceptions: lumbar surgery   Communication Communication Communication: No difficulties   Cognition Arousal/Alertness: Awake/alert Behavior During Therapy: WFL for tasks assessed/performed Overall Cognitive Status: Within Functional Limits for tasks assessed                                 General Comments: Pt forgetful that tomorrow is Friday on occasion but otherwise seems to be aware of his deficits and safety, knowing when to return to room to avoid excessive fatigue.   General Comments  wife present throughout session.   He was limited by pain    Exercises     Shoulder Instructions      Home Living Family/patient expects to be discharged to:: Private residence Living Arrangements: Alone Available Help at Discharge: Family Type of Home: House Home Access: Stairs to enter Technical brewer of Steps: 1 Entrance Stairs-Rails: Left Home Layout: One level     Bathroom Shower/Tub: Tub/shower unit;Walk-in shower   Bathroom Toilet: Handicapped height     Home Equipment: Environmental consultant - 2 wheels;Cane - single point;Hand held shower head;Grab bars - tub/shower;Shower seat;Shower seat - built Hotel manager: Reacher;Long-handled shoe horn;Long-handled sponge        Prior Functioning/Environment Level of Independence: Independent with assistive device(s)        Comments: Since his surgery a few months ago he has been needing to use his SPC.  He has been using AE for LB ADLs or wife assists him        OT Problem List: Decreased activity tolerance;Impaired balance (sitting and/or standing);Pain;Decreased knowledge of use of DME or AE;Decreased knowledge of precautions      OT Treatment/Interventions: Self-care/ADL training;DME and/or AE  instruction;Therapeutic activities;Patient/family education;Balance training    OT Goals(Current goals can be found in the care plan section) Acute Rehab OT Goals Patient Stated Goal: To have less back pain OT Goal Formulation: With patient/family Time For Goal Achievement: 06/14/20 Potential to Achieve Goals: Good ADL Goals Pt Will Perform Grooming: with supervision;standing Pt Will Perform Lower Body Bathing: with supervision;with adaptive equipment;sit to/from stand Pt Will Perform Upper Body Dressing: with set-up;with supervision;sitting Pt Will Perform Lower Body Dressing: with min assist;with adaptive equipment;sit to/from stand Pt Will Transfer to Toilet: with supervision;ambulating;regular height toilet;bedside commode;grab bars Pt Will Perform Toileting - Clothing Manipulation and hygiene: with supervision;sit to/from stand  OT Frequency: Min 2X/week   Barriers to D/C:            Co-evaluation              AM-PAC OT "6 Clicks" Daily Activity     Outcome Measure Help from another person eating meals?: None Help from another person taking care of personal grooming?: A Little Help from another person toileting, which includes using toliet, bedpan, or urinal?: A Lot Help from another person bathing (including washing, rinsing, drying)?: A Lot Help from another person to put on and taking off regular upper body clothing?: A Little Help from another person to put on and taking off regular lower body clothing?: A Lot 6 Click Score: 16   End of Session Equipment Utilized During Treatment: Back  brace;Rolling walker Nurse Communication: Mobility status  Activity Tolerance: No increased pain Patient left: in chair;with call bell/phone within reach;with chair alarm set;with family/visitor present  OT Visit Diagnosis: Unsteadiness on feet (R26.81);Pain Pain - part of body:  (back)                Time: 1127-1202 OT Time Calculation (min): 35 min Charges:  OT General  Charges $OT Visit: 1 Visit OT Evaluation $OT Eval Moderate Complexity: 1 Mod OT Treatments $Therapeutic Activity: 8-22 mins  Nilsa Nutting., OTR/L Acute Rehabilitation Services Pager [phone] Office [phone]   Lucille Passy M 05/31/2020, 3:35 PM

## 2020-05-31 NOTE — Evaluation (Signed)
Physical Therapy Evaluation Patient Details Name: [name] MRN: [id number] DOB: 08-05-1940 Today's Date: 05/31/2020   History of Present Illness  Pt is an 80 y.o. 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 S/p ALIF L1-2 and removal of instrumentation L1-2 with replacement of instrumentation L2-3 on 4/20. PMH: s/p lumbar fusion, s/p cervical fusion, paroxysmal a-fib, malignant neoplasm prostate, HTN, dyspnea on exertion, DM, depression, CAD, cataract, arthritis, and angina pectoris.    Clinical Impression  Pt presents with condition above and deficits mentioned below, see PT Problem List. Prior to his surgery a few months ago he was independent without use of an AD/AE but has since been using a SPC for mod I functional mobility. Pt lives alone ina single story home with 1 STE, but has good social support to provide him with 24/7 assistance as needed. Currently, pt needing min guard for all functional mobility except minA to lift his legs into bed. Pt was able to ambulate household distances with a RW with no LOB. While he recalls his spinal precautions and maintains them with bed mobility, he needs reminders to not twist to look at people when standing. He does display some incoordination, like dysdiadochokinesia, with his R leg but otherwise displays symmetrical and intact strength and sensation in his bil legs. Expecting pt to improve significantly as pain decreases and pt reports remembering his prior HEPs, thus no PT follow up deemed necessary at this time. Will continue to follow acutely.    Follow Up Recommendations No PT follow up;Supervision/Assistance - 24 hour (initially, then progress to intermittent)    Equipment Recommendations  None recommended by PT    Recommendations for Other Services       Precautions / Restrictions Precautions Precautions: Back;Fall Precaution Booklet  Issued: Yes (comment) Precaution Comments: Hemovac; Reviewed precautions and BLT, needs cues to maintain no twisting with standing tasks Required Braces or Orthoses: Spinal Brace Spinal Brace: Lumbar corset;Applied in sitting position Restrictions Weight Bearing Restrictions: No      Mobility  Bed Mobility Overal bed mobility: Needs Assistance Bed Mobility: Rolling;Sidelying to Sit;Sit to Sidelying Rolling: Min guard Sidelying to sit: Min guard     Sit to sidelying: Min assist General bed mobility comments: Pt demonstrating good compliance with spinal precautions, log rolling to R with use of rail. Extra time for mobility due to pain. Pt able to come to sit EOB with min guard. Pt needing minA to manage legs back into bed, with cues to descend trunk onto elbow.    Transfers Overall transfer level: Needs assistance Equipment used: Rolling walker (2 wheeled) Transfers: Sit to/from Stand Sit to Stand: Min guard         General transfer comment: Pt able to come to stand safely without LOB with min guard. Pt needing occasional cues for proper hand placement sit <> stand.  Ambulation/Gait Ambulation/Gait assistance: Min guard Gait Distance (Feet): 200 Feet Assistive device: Rolling walker (2 wheeled) Gait Pattern/deviations: Step-through pattern;Decreased stride length Gait velocity: reduced Gait velocity interpretation: <1.8 ft/sec, indicate of risk for recurrent falls General Gait Details: Pt ambulates slowly but no LOB. Min guard for safety. Cues to keep RW on ground with turns, good carryover. Pt intermittently twisting to look at PT during gait, needing reminders to maintain spinal precautions via turning entire body instead, poor carrryover.  Stairs            Emergency planning/management officer  Modified Rankin (Stroke Patients Only)       Balance Overall balance assessment: Mild deficits observed, not formally tested                                            Pertinent Vitals/Pain Pain Assessment: 0-10 Pain Score: 6  Pain Location: back; surgical site Pain Descriptors / Indicators: Grimacing;Discomfort;Guarding;Operative site guarding Pain Intervention(s): Limited activity within patient's tolerance;Monitored during session;Repositioned    Home Living Family/patient expects to be discharged to:: Private residence Living Arrangements: Alone Available Help at Discharge: Family (daughter and girlfriend) Type of Home: House Home Access: Stairs to enter Entrance Stairs-Rails: Left Entrance Stairs-Number of Steps: 1 Home Layout: One level Home Equipment: Environmental consultant - 2 wheels;Cane - single point;Hand held shower head;Grab bars - tub/shower;Shower seat;Shower seat - built in      Prior Function Level of Independence: Independent with assistive device(s)         Comments: Since his surgery a few months ago he has been needing to use his [facility] but prior to his surgery he was independent without AD/AE.     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      General Comments General comments (skin integrity, edema, etc.): Pt reporting feeling lightheaded and nauseated towards the end of ambulating, thus returned to room to sit EOB and take BP 120/60, RN aware    Exercises     Assessment/Plan    PT Assessment Patient needs continued PT services  PT Problem List Decreased activity tolerance;Decreased balance;Decreased mobility;Decreased coordination;Decreased knowledge of use of DME;Decreased knowledge of precautions;Decreased safety awareness       PT Treatment Interventions DME instruction;Gait training;Stair training;Therapeutic activities;Functional mobility training;Balance training;Therapeutic exercise;Neuromuscular re-education;Patient/family education    PT Goals (Current goals can be found in the Care Plan section)  Acute Rehab PT Goals Patient Stated Goal: to get better and go home PT Goal Formulation: With patient Time For Goal Achievement: 06/14/20 Potential to Achieve Goals: Good    Frequency Min 5X/week   Barriers to discharge        Co-evaluation               AM-PAC PT "6 Clicks" Mobility  Outcome Measure Help needed turning from your back to your side while in a flat bed without using bedrails?: A Little Help needed moving from lying on your back to sitting  on the side of a flat bed without using bedrails?: A Little Help needed moving to and from a bed to a chair (including a wheelchair)?: A Little Help needed standing up from a chair using your arms (e.g., wheelchair or bedside chair)?: A Little Help needed to walk in hospital room?: A Little Help needed climbing 3-5 steps with a railing? : A Little 6 Click Score: 18    End of Session Equipment Utilized During Treatment: Gait belt;Back brace Activity Tolerance: Patient tolerated treatment well Patient left: in bed;with call bell/phone within  reach;with bed alarm set Nurse Communication: Mobility status;Other (comment) (lightheadedness, nausea, BP) PT Visit Diagnosis: Unsteadiness on feet (R26.81);Other abnormalities of gait and mobility (R26.89);Difficulty in walking, not elsewhere classified (R26.2);Other symptoms and signs involving the nervous system (R29.898)    Time: 0156-1537 PT Time Calculation (min) (ACUTE ONLY): 34 min   Charges:   PT Evaluation $PT Eval Low Complexity: 1 Low PT Treatments $Gait Training: 8-22 mins        Moishe Spice, PT, DPT Acute Rehabilitation Services  Pager: [phone] Office: [place] 05/31/2020, 2:43 PM

## 2020-05-31 NOTE — Progress Notes (Signed)
He has quite a bit of back soreness.  No leg pain or numbness tingling or weakness.  He is moving his legs well.  He has significant back pain with any movement in bed.  We will try physical and occupational therapy today.  Continue to pain control.

## 2020-06-01 ENCOUNTER — Encounter (HOSPITAL_COMMUNITY): Payer: Self-pay | Admitting: Neurological Surgery

## 2020-06-01 NOTE — Progress Notes (Signed)
Occupational Therapy Treatment Patient Details Name: [name] MRN: [id number] DOB: [birth year] Today's Date: 06/01/2020    History of present illness Pt is an 80 y.o. 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 S/p ALIF L1-2 and removal of instrumentation L1-2 with replacement of instrumentation L2-3 on 4/20. PMH: s/p lumbar fusion, s/p cervical fusion, paroxysmal a-fib, malignant neoplasm prostate, HTN, dyspnea on exertion, DM, depression, CAD, cataract, arthritis, and angina pectoris.   OT comments  Pt sitting up in chair requesting to return to bed.  He c/o lightheadedness.  Intermittent horizontal Lt beating nystagmus noted. When returning to supine  Pt reports dizziness onset occurred with head turns earlier - ? If he has vestibular dysfunction.  BP 120/84  Follow Up Recommendations  No OT follow up;Supervision/Assistance - 24 hour    Equipment Recommendations  None recommended by OT    Recommendations for Other Services      Precautions / Restrictions Precautions Precautions: Back;Fall Precaution Booklet Issued: Yes (comment) Precaution Comments: Hemovac; Reviewed precautions.  He was able to recall precautions with min cues Required Braces or Orthoses: Spinal Brace Spinal Brace: Lumbar corset;Applied in sitting position       Mobility Bed Mobility Overal bed mobility: Needs Assistance Bed Mobility: Rolling;Sit to Sidelying         Sit to sidelying: Min guard General bed mobility comments: demonstrates good technique    Transfers Overall transfer level: Needs assistance Equipment used: Rolling walker (2 wheeled) Transfers: Sit to/from [facility] Sit to Stand: Min guard Stand pivot transfers: Min guard       General transfer comment: min guard assist for sfaety    Balance Overall balance assessment: Mild deficits observed, not formally  tested                                         ADL either performed or assessed with clinical judgement   ADL Overall ADL's : Needs assistance/impaired                         Toilet Transfer: Stand-pivot;RW;BSC           Functional mobility during ADLs: Min guard;Rolling walker       Vision   Additional Comments: Intermittent Lt beating nystagmus noted with pursuits.  Pt with c/o dizziness while seated up in chair and requesting return to bed   Perception     Praxis      Cognition Arousal/Alertness: Awake/alert Behavior During Therapy: WFL for tasks assessed/performed Overall Cognitive Status: Within Functional Limits for tasks assessed                                          Exercises     Shoulder Instructions       General Comments Pt had removed back brace while in the recliner. Instructed him to keep brace on until he was back in bed    Pertinent Vitals/ Pain       Pain Assessment: Faces Faces Pain Scale: Hurts little more Pain Location: back; surgical site Pain Descriptors / Indicators: Grimacing;Discomfort;Guarding;Operative site guarding Pain Intervention(s): Monitored during session;Repositioned  Home Living  Prior Functioning/Environment              Frequency  Min 2X/week        Progress Toward Goals  OT Goals(current goals can now be found in the care plan section)  Progress towards OT goals: Progressing toward goals     Plan Discharge plan remains appropriate    Co-evaluation                 AM-PAC OT "6 Clicks" Daily Activity     Outcome Measure   Help from another person eating meals?: None Help from another person taking care of personal grooming?: A Little Help from another person toileting, which includes using toliet, bedpan, or urinal?: A Lot Help from another person bathing (including washing, rinsing,  drying)?: A Lot Help from another person to put on and taking off regular upper body clothing?: A Little Help from another person to put on and taking off regular lower body clothing?: A Lot 6 Click Score: 16    End of Session Equipment Utilized During Treatment: Back brace  OT Visit Diagnosis: Unsteadiness on feet (R26.81);Pain   Activity Tolerance Other (comment) (nauseated/lightheadedness)   Patient Left in bed;with call bell/phone within reach;with bed alarm set   Nurse Communication Mobility status        Time: 1741-1754 OT Time Calculation (min): 13 min  Charges: OT General Charges $OT Visit: 1 Visit OT Treatments $Therapeutic Activity: 8-22 mins  Nilsa Nutting OTR/L Acute Rehabilitation Services Pager [phone] Office [phone]    Lucille Passy M 06/01/2020, 6:02 PM

## 2020-06-01 NOTE — Progress Notes (Signed)
Subjective: Patient reports back pain that limits mobility from bed to upright. Once walking he does well. No leg pain or NTW  Objective: Vital signs in last 24 hours: Temp:  [97.6 F (36.4 C)-98.5 F (36.9 C)] 97.8 F (36.6 C) (04/22 0816) Pulse Rate:  [65-73] 73 (04/22 0816) Resp:  [14-18] 18 (04/22 0816) BP: (106-113)/(53-67) 109/58 (04/22 0816) SpO2:  [91 %-94 %] 91 % (04/22 0816)  Intake/Output from previous day: 04/21 0701 - 04/22 0700 In: 723 [P.O.:720; I.V.:3] Out: 115 [Drains:115] Intake/Output this shift: Total I/O In: 240 [P.O.:240] Out: -   Neurologic: Grossly normal  Lab Results: Lab Results  Component Value Date   WBC 7.0 05/29/2020   HGB 15.5 05/29/2020   HCT 45.6 05/29/2020   MCV 90.1 05/29/2020   PLT 194 05/29/2020   Lab Results  Component Value Date   INR 0.9 05/29/2020   BMET Lab Results  Component Value Date   NA 140 05/29/2020   K 4.9 05/29/2020   CL 106 05/29/2020   CO2 24 05/29/2020   GLUCOSE 121 (H) 05/29/2020   BUN 26 (H) 05/29/2020   CREATININE 1.03 05/29/2020   CALCIUM 9.6 05/29/2020    Studies/Results: DG Lumbar Spine 2-3 Views  Result Date: 05/30/2020 CLINICAL DATA:  Surgery, elective. Additional history provided: XLIF L1-L2, lateral plate, removal of pedicle screws L1. Provided fluoroscopy time 1 minutes, 45 seconds (27.87 mGy). EXAM: LUMBAR SPINE - 2-3 VIEW; DG C-ARM 1-60 MIN COMPARISON:  Lumbar CT myelogram 04/25/2020 FINDINGS: AP and lateral view intraoperative fluoroscopic images of the lumbar spine are submitted, 4 images total. On the provided images, there is a redemonstrated posterior spinal fusion construct spanning the L1-L3 levels (bilateral pedicle screws and vertical interconnecting rods). An interbody device is again seen at L3-L4. Redemonstrated, but partially imaged, bilateral pedicle screws at L4. New from the prior examination of 04/25/2020, lateral fixation hardware (lateral plate and screws) is present at L1-L2.  An interbody device is also now present at L1-L2. Overlying surgical instruments and retractors on several images. IMPRESSION: Four intraoperative fluoroscopic images of the lumbar spine, as described. Electronically Signed   By: Kellie Simmering DO   On: 05/30/2020 17:11   DG C-Arm 1-60 Min  Result Date: 05/30/2020 CLINICAL DATA:  Surgery, elective. Additional history provided: XLIF L1-L2, lateral plate, removal of pedicle screws L1. Provided fluoroscopy time 1 minutes, 45 seconds (27.87 mGy). EXAM: LUMBAR SPINE - 2-3 VIEW; DG C-ARM 1-60 MIN COMPARISON:  Lumbar CT myelogram 04/25/2020 FINDINGS: AP and lateral view intraoperative fluoroscopic images of the lumbar spine are submitted, 4 images total. On the provided images, there is a redemonstrated posterior spinal fusion construct spanning the L1-L3 levels (bilateral pedicle screws and vertical interconnecting rods). An interbody device is again seen at L3-L4. Redemonstrated, but partially imaged, bilateral pedicle screws at L4. New from the prior examination of 04/25/2020, lateral fixation hardware (lateral plate and screws) is present at L1-L2. An interbody device is also now present at L1-L2. Overlying surgical instruments and retractors on several images. IMPRESSION: Four intraoperative fluoroscopic images of the lumbar spine, as described. Electronically Signed   By: Kellie Simmering DO   On: 05/30/2020 17:11    Assessment/Plan: Doing well, maybe home tomorrow, still requires IV pain control, continue PT/OT   LOS: 2 days    Ocie Cornfield [facility] 06/01/2020, 11:34 AM

## 2020-06-01 NOTE — Progress Notes (Signed)
Physical Therapy Treatment Patient Details Name: [name] MRN: [id number] DOB: 1940/11/09 Today's Date: 06/01/2020    History of Present Illness Pt is an 80 y.o. 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 S/p ALIF L1-2 and removal of instrumentation L1-2 with replacement of instrumentation L2-3 on 4/20. PMH: s/p lumbar fusion, s/p cervical fusion, paroxysmal a-fib, malignant neoplasm prostate, HTN, dyspnea on exertion, DM, depression, CAD, cataract, arthritis, and angina pectoris.    PT Comments    Pt continues to demonstrate good compliance with spinal precautions majority of time, needing intermittent cues to not twist to look at objects when standing and ambulating. Pt with steady gait using RW with no LOB. Pt only needing min guard for gait and to ascend stairs, but needs minA to descend stairs with use of rail. Pt educated on having assistants at home guard him on stairs properly. Pt reporting lightheadedness with changes in position, thus assessed BP with changes in position but BP changed appropriately, see General Comments below. Cued pt to extend and rotate his neck to either side while sitting with worsening of symptoms reported and beating nystagmus noted when rotating to L and no nystagmus but mild symptoms when rotating to R. Pt would benefit from a vestibular evaluation. Will continue to follow acutely. Current recommendations remain appropriate.   Follow Up Recommendations  No PT follow up;Supervision/Assistance - 24 hour (initially, then progress to intermittent)     Equipment Recommendations  None recommended by PT    Recommendations for Other Services       Precautions / Restrictions Precautions Precautions: Back;Fall Precaution Booklet Issued: Yes (comment) Precaution Comments: Hemovac; Reviewed precautions, needs cues to maintain no twisting with standing  tasks Required Braces or Orthoses: Spinal Brace Spinal Brace: Lumbar corset;Applied in sitting position Restrictions Weight Bearing Restrictions: No    Mobility  Bed Mobility Overal bed mobility: Needs Assistance Bed Mobility: Rolling;Sidelying to Sit;Sit to Sidelying Rolling: Min guard Sidelying to sit: Min guard     Sit to sidelying: Min guard General bed mobility comments: Pt demonstrating good compliance with spinal precautions, log rolling to R with use of rail. Cues to flex L knee to assist in roll. Extra time for mobility due to pain. Pt able to come to sit EOB with min guard.    Transfers Overall transfer level: Needs assistance Equipment used: Rolling walker (2 wheeled) Transfers: Sit to/from Stand Sit to Stand: Min guard Stand pivot transfers: Min guard       General transfer comment: Pt able to come to stand safely without LOB with min guard. Pt needing occasional cues for proper hand placement sit <> stand.  Ambulation/Gait Ambulation/Gait assistance: Min guard Gait Distance (Feet): 300 Feet Assistive device: Rolling walker (2 wheeled) Gait Pattern/deviations: Step-through pattern;Decreased stride length Gait velocity: reduced Gait velocity interpretation: 1.31 - 2.62 ft/sec, indicative of limited community ambulator General Gait Details: Pt ambulates slowly but no LOB. Min guard for safety. Cues to keep RW on ground with turns, good carryover. Pt cued to remain proximal with RW, even when turning or stepping back.   Stairs Stairs: Yes Stairs assistance: Min guard;Min assist Stair Management: One rail Right;One rail Left;Step to pattern Number of Stairs: 2 General stair comments: Ascending with R rail and descending with L, only needing min guard for safety ascending but minA to descend due to pain limiting stability.   Wheelchair Mobility    Modified  Rankin (Stroke Patients Only)       Balance Overall balance assessment: Mild deficits observed, not  formally tested                                          Cognition Arousal/Alertness: Awake/alert Behavior During Therapy: WFL for tasks assessed/performed Overall Cognitive Status: Within Functional Limits for tasks assessed                                        Exercises      General Comments General comments (skin integrity, edema, etc.): BP start of session supine 106/65; BP sitting 119/66; BP end of session after ambulating 134/73; pt reporting some lightheadedness (denied dizziness initially) with changes in position; when sitting in chair, cued pt to extend and rotate neck to L with noted beating nystagmus and worsening of symptoms, cued pt to then extend and rotate neck to R with no nystagmus but minor symptoms reported      Pertinent Vitals/Pain Pain Assessment: Faces Faces Pain Scale: Hurts little more Pain Location: back; surgical site Pain Descriptors / Indicators: Grimacing;Discomfort;Guarding;Operative site guarding Pain Intervention(s): Limited activity within patient's tolerance;Monitored during session;Repositioned    Home Living                      Prior Function            PT Goals (current goals can now be found in the care plan section) Acute Rehab PT Goals Patient Stated Goal: to get better and go home PT Goal Formulation: With patient Time For Goal Achievement: 06/14/20 Potential to Achieve Goals: Good Progress towards PT goals: Progressing toward goals    Frequency    Min 5X/week      PT Plan Current plan remains appropriate    Co-evaluation              AM-PAC PT "6 Clicks" Mobility   Outcome Measure  Help needed turning from your back to your side while in a flat bed without using bedrails?: A Little Help needed moving from lying on your back to sitting on the side of a flat bed without using bedrails?: A Little Help needed moving to and from a bed to a chair (including a wheelchair)?:  A Little Help needed standing up from a chair using your arms (e.g., wheelchair or bedside chair)?: A Little Help needed to walk in hospital room?: A Little Help needed climbing 3-5 steps with a railing? : A Little 6 Click Score: 18    End of Session Equipment Utilized During Treatment: Gait belt;Back brace Activity Tolerance: Patient tolerated treatment well Patient left: with call bell/phone within reach;in chair;with chair alarm set Nurse Communication: Mobility status;Other (comment) (lightheadedness, possible vestibular issues, BP) PT Visit Diagnosis: Unsteadiness on feet (R26.81);Other abnormalities of gait and mobility (R26.89);Difficulty in walking, not elsewhere classified (R26.2);Other symptoms and signs involving the nervous system (R29.898)     Time: 1027-2536 PT Time Calculation (min) (ACUTE ONLY): 25 min  Charges:  $Gait Training: 23-37 mins                     Moishe Spice, PT, DPT Acute Rehabilitation Services  Pager: [phone] Office: [place] 06/01/2020, 6:42 PM

## 2020-06-01 NOTE — TOC Initial Note (Signed)
Transition of Care (TOC) - Initial/Assessment Note  Marvetta Gibbons RN,BSN Transitions of Care Unit 4NP (non trauma) - RN Case Manager See Treatment Team for direct Phone #   Patient Details  Name: [name] MRN: [id number] Date of Birth: Jul 07, 1940  Transition of Care [facility]) CM/SW Contact:    Dawayne Patricia, RN Phone Number: 06/01/2020, 12:17 PM  Clinical Narrative:                 Pt admitted from home s/p lumbar fusion. Noted DME orders placed for RW and 3n1. Call made to patient's room to discuss transition needs. Per pt he has all needed DME at home already. Declines need for RW or 3n1.  No f/u therapy needs noted.  Pt to return home with assistance from family.   No further TOC needs noted at this time.   Expected Discharge Plan: Home/Self Care Barriers to Discharge: Continued Medical Work up   Patient Goals and CMS Choice Patient states their goals for this hospitalization and ongoing recovery are:: return home CMS Medicare.gov Compare Post Acute Care list provided to:: Patient Choice offered to / list presented to : Patient  Expected Discharge Plan and Services Expected Discharge Plan: Home/Self Care   Discharge Planning Services: CM Consult Post Acute Care Choice: Durable Medical Equipment Living arrangements for the past 2 months: Single Family Home                 DME Arranged: 3-N-1,Walker rolling,Patient refused services (Pt states he has all needed DME at home) DME Agency: NA       HH Arranged: NA [place] Agency: NA        Prior Living Arrangements/Services Living arrangements for the past 2 months: Single Family Home Lives with:: Self Patient language and need for interpreter reviewed:: Yes Do you feel safe going back to the place where you live?: Yes      Need for Family Participation in Patient Care: Yes (Comment) Care giver support system in place?: Yes (comment) Current home services: DME Criminal Activity/Legal Involvement Pertinent to  Current Situation/Hospitalization: No - Comment as needed  Activities of Daily Living      Permission Sought/Granted                  Emotional Assessment Appearance:: Appears stated age Attitude/Demeanor/Rapport: Engaged Affect (typically observed): Appropriate Orientation: : Oriented to Self,Oriented to Place,Oriented to Situation,Oriented to  Time Alcohol / Substance Use: Not Applicable Psych Involvement: No (comment)  Admission diagnosis:  S/P lumbar spinal fusion [Z98.1] Patient Active Problem List   Diagnosis Date Noted  . Medicare annual wellness visit, subsequent 01/16/2020  . Arthritis   . Atrial fibrillation ([place])   . Cataract   . Diabetes mellitus without complication ([place])   . Dysrhythmia   . GERD (gastroesophageal reflux disease)   . Headache   . History of hiatal hernia   . History of kidney stones   . Hyperlipemia   . Hypertension   . PONV (postoperative nausea and vomiting)   . S/P lumbar fusion 12/09/2019  . Mixed hyperlipidemia 08/31/2019  . Angina pectoris ([place]) 08/02/2019  . Diabetes mellitus due to underlying condition with unspecified complications ([place]) 31/51/7616  . Dyspnea on exertion 06/28/2019  . Chest tightness 06/28/2019  . 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 Impaired fasting blood sugar 05/20/2019  . History of prostate cancer 05/20/2019  . Malignant neoplasm prostate ([place]) 05/20/2019  . Depression 04/04/2019  . Hypercholesteremia 04/04/2019  .  Paroxysmal atrial fibrillation ([place]) 04/04/2019  . Colitis 11/22/2018  . Sepsis ([place]) 11/22/2018  . Diarrhea 11/21/2018  . 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 Dyslipidemia 06/17/2016  . Essential hypertension 06/17/2016  . Cancer ([place]) 10/2015  . S/P lumbar spinal fusion 09/29/2013   PCP:  Rochel Brome, MD Pharmacy:   [id number] - [place], [place] - [address] [facility] [place] [address] Phone: [phone] Fax: [phone]     Social Determinants of Health (SDOH) Interventions    Readmission Risk Interventions No flowsheet data found.

## 2020-06-01 NOTE — Care Management Important Message (Signed)
Important Message  Patient Details  Name: [name] MRN: [id number] Date of Birth: May 20, 1940   Medicare Important Message Given:  Yes     Orbie Pyo 06/01/2020, 2:16 PM

## 2020-06-02 ENCOUNTER — Encounter: Payer: Self-pay | Admitting: Family Medicine

## 2020-06-02 MED ORDER — METHOCARBAMOL 500 MG PO TABS: 500.0000 mg | ORAL_TABLET | Freq: Four times a day (QID) | ORAL | 1 refills | Status: DC | PRN

## 2020-06-02 MED ORDER — SENNA 8.6 MG PO TABS: 1.0000 | ORAL_TABLET | Freq: Two times a day (BID) | ORAL | 0 refills | Status: DC

## 2020-06-02 MED ORDER — HYDROCODONE-ACETAMINOPHEN 10-325 MG PO TABS: 1.0000 | ORAL_TABLET | ORAL | 0 refills | Status: DC | PRN

## 2020-06-02 NOTE — Progress Notes (Signed)
Physical Therapy Treatment Patient Details Name: [name] MRN: [id number] DOB: September 03, 1940 Today's Date: 06/02/2020    History of Present Illness Pt is an 80 y.o. 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 S/p ALIF L1-2 and removal of instrumentation L1-2 with replacement of instrumentation L2-3 on 4/20. PMH: s/p lumbar fusion, s/p cervical fusion, paroxysmal a-fib, malignant neoplasm prostate, HTN, dyspnea on exertion, DM, depression, CAD, cataract, arthritis, and angina pectoris.    PT Comments    Pt tolerates ambulation for increased distances and demonstrates improved stability without UE support. Pt denies symptoms of dizziness during session and is asymptomatic with vestibular assessment, although does demonstrate some nystagmus with eye movement from left to right. Pt is able to perform all mobility required in the home without physical assistance at this time. PT continues to recommend no PT or DME needs.  Follow Up Recommendations  No PT follow up;Supervision for mobility/OOB     Equipment Recommendations  None recommended by PT    Recommendations for Other Services       Precautions / Restrictions Precautions Precautions: Back;Fall Precaution Booklet Issued: Yes (comment) Precaution Comments: pt is able to recall 3/3 back precautions Required Braces or Orthoses: Spinal Brace Spinal Brace: Lumbar corset;Applied in sitting position Restrictions Weight Bearing Restrictions: No    Mobility  Bed Mobility Overal bed mobility: Needs Assistance Bed Mobility: Rolling;Sidelying to Sit Rolling: Supervision Sidelying to sit: Supervision       General bed mobility comments: use of bed rails    Transfers Overall transfer level: Needs assistance Equipment used: Rolling walker (2 wheeled) Transfers: Sit to/from Stand Sit to Stand: Supervision             Ambulation/Gait Ambulation/Gait assistance: Supervision Gait Distance (Feet): 300 Feet (additional 20' with RW) Assistive device: Rolling walker (2 wheeled);None Gait Pattern/deviations: Step-through pattern Gait velocity: reduced Gait velocity interpretation: 1.31 - 2.62 ft/sec, indicative of limited community ambulator General Gait Details: pt with slowed step-through gait, pt is able to perform head turns without loss of balance or reports of dizziness   Stairs Stairs: Yes Stairs assistance: Supervision Stair Management: One rail Right;Step to pattern Number of Stairs: 2     Wheelchair Mobility    Modified Rankin (Stroke Patients Only)       Balance Overall balance assessment: Mild deficits observed, not formally tested                                          Cognition Arousal/Alertness: Awake/alert Behavior During Therapy: WFL for tasks assessed/performed Overall Cognitive Status: Within Functional Limits for tasks assessed                                        Exercises      General Comments General comments (skin integrity, edema, etc.): Vestibular assessment: Pursuits- PT notes one  leftward beat of nystagmus with eye movement from left to right, pt denies symptoms. Saccades are asymptomatic. VOR horizontal and vertical asymptomatic but again with one leftward beat of nystagmus with eye movement from left to right      Pertinent Vitals/Pain Pain Assessment: 0-10 Pain Score: 7  Pain Location: back Pain Descriptors / Indicators: Aching Pain Intervention(s): Monitored  during session    Home Living                      Prior Function            PT Goals (current goals can now be found in the care plan section) Acute Rehab PT Goals Patient Stated Goal: to get better and go home Progress towards PT goals: Progressing toward goals    Frequency    Min 5X/week      PT Plan Current plan remains  appropriate    Co-evaluation              AM-PAC PT "6 Clicks" Mobility   Outcome Measure  Help needed turning from your back to your side while in a flat bed without using bedrails?: A Little Help needed moving from lying on your back to sitting on the side of a flat bed without using bedrails?: A Little Help needed moving to and from a bed to a chair (including a wheelchair)?: A Little Help needed standing up from a chair using your arms (e.g., wheelchair or bedside chair)?: A Little Help needed to walk in hospital room?: A Little Help needed climbing 3-5 steps with a railing? : A Little 6 Click Score: 18    End of Session Equipment Utilized During Treatment: Gait belt Activity Tolerance: Patient tolerated treatment well Patient left: in chair;with call bell/phone within reach Nurse Communication: Mobility status PT Visit Diagnosis: Unsteadiness on feet (R26.81);Other abnormalities of gait and mobility (R26.89);Difficulty in walking, not elsewhere classified (R26.2);Other symptoms and signs involving the nervous system (K16.010)     Time: 9323-5573 PT Time Calculation (min) (ACUTE ONLY): 20 min  Charges:  $Gait Training: 8-22 mins                     Zenaida Niece, PT, DPT Acute Rehabilitation Pager: [phone]    Zenaida Niece 06/02/2020, 10:26 AM

## 2020-06-02 NOTE — Discharge Summary (Signed)
Physician Discharge Summary  Patient ID: [name] MRN: [id number] DOB/AGE: 1940/08/03 80 y.o.  Admit date: 05/30/2020 Discharge date: 06/02/2020  Admission Diagnoses:  1.  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 *   Discharged Condition: Stable  Hospital Course:  [name] is a 80 y.o. male that presented for an elective L1-3 pseudoarthrosis and underwent an elective surgery for revision of his fusion.  This consisted of an anterior lateral retroperitoneal interbody fusion at L1-2, revision of posterior hardware L1-3.  He tolerated the surgery well.  Postoperatively he was evaluated by PT/OT and agreed to go home.  His pain was controlled on oral medication upon discharge, he was having normal bowel bladder function upon discharge.  He was at his neurologic baseline, ambulating well.  His incisions were healing appropriately, he had a subfascial drain that was removed on 06/02/2020 from his lumbar incision.  Treatments: Surgery -L1-2 anterior lateral retroperitoneal interbody fusion, revision of hardware, posterior lateral fusion L1-3  Discharge Exam: Blood pressure 102/89, pulse 69, temperature (!) 97.5 F (36.4 C), temperature source Oral, resp. rate 18, height 5\' 11"  (1.803 m), weight 83.5 kg, SpO2 93 %. Awake, alert, oriented Speech fluent, appropriate CN grossly intact 5/5 BUE/BLE Abdomen soft, nontender Wounds c/d/i  Disposition: Discharge disposition: 01-Home or Self Care        Allergies as of 06/02/2020      Reactions   Oxycontin [oxycodone] Other (See Comments)   loopy      Medication List    STOP taking these medications   meloxicam 15 MG tablet Commonly known as: MOBIC     TAKE these medications   aspirin EC 81 MG tablet Take 1 tablet (81 mg total) by mouth daily. What changed: when to take this   atorvastatin 40 MG tablet Commonly known as: LIPITOR TAKE 1 TABLET BY  MOUTH EVERY DAY *NEW DOSE   benzonatate 200 MG capsule Commonly known as: TESSALON Take 1 capsule (200 mg total) by mouth 3 (three) times daily as needed for cough.   carboxymethylcellulose 0.5 % Soln Commonly known as: REFRESH PLUS Place 1 drop into both eyes 3 (three) times daily as needed (dry/irritated eyes).   cholecalciferol 1000 units tablet Commonly known as: VITAMIN D Take 1,000 Units by mouth in the morning.   Fish Oil 1200 MG Caps Take 1,200 mg by mouth in the morning.   gabapentin 600 MG tablet Commonly known as: NEURONTIN TAKE 1 TABLET BY MOUTH THREE TIMES A DAY What changed:   how much to take  when to take this   HYDROcodone-acetaminophen 10-325 MG tablet Commonly known as: NORCO Take 1 tablet by mouth every 4 (four) hours as needed for moderate pain ((score 4 to 6)). What changed:   when to take this  reasons to take this   lisinopril 10 MG tablet Commonly known as: ZESTRIL TAKE 2 TABLETS BY MOUTH EVERY DAY What changed: Another medication with the same name was removed. Continue taking this medication, and follow the directions you see here.   metFORMIN 500 MG tablet Commonly known as: GLUCOPHAGE TAKE 1 TABLET BY MOUTH EVERY DAY WITH BREAKFAST What changed: See the new instructions.   methocarbamol 500 MG tablet Commonly known as: ROBAXIN Take 1 tablet (500 mg total) by mouth every 6 (six) hours as needed for muscle spasms.   metoCLOPramide 10 MG tablet Commonly known as: REGLAN Take 1 tablet (10 mg total) by mouth  every 6 (six) hours as needed for nausea, vomiting or refractory nausea / vomiting (hiccups).   nitroGLYCERIN 0.4 MG SL tablet Commonly known as: NITROSTAT Place 1 tablet (0.4 mg total) under the tongue every 5 (five) minutes x 3 doses as needed for chest pain.   pantoprazole 40 MG tablet Commonly known as: PROTONIX TAKE 1 TABLET BY MOUTH EVERY DAY   senna 8.6 MG Tabs tablet Commonly known as: SENOKOT Take 1 tablet (8.6 mg  total) by mouth 2 (two) times daily.   tamsulosin 0.4 MG Caps capsule Commonly known as: FLOMAX Take 0.4 mg by mouth daily after supper.            Durable Medical Equipment  (From admission, onward)         Start     Ordered   05/30/20 2003  DME Walker rolling  Once       Question:  Patient needs a walker to treat with the following condition  Answer:  S/P lumbar fusion   05/30/20 2002   05/30/20 2003  DME 3 n 1  Once        05/30/20 2002          Follow-up Information    Eustace Moore, MD Follow up in 2 week(s).   Specialty: Neurosurgery Contact information: [address] [place] [address] [phone]               Signed: Theodoro Doing Matti Killingsworth 06/02/2020, 9:07 AM

## 2020-06-04 ENCOUNTER — Other Ambulatory Visit: Payer: Self-pay

## 2020-06-04 MED ORDER — GABAPENTIN 600 MG PO TABS: 600.0000 mg | ORAL_TABLET | Freq: Three times a day (TID) | ORAL | 1 refills | Status: DC

## 2020-06-06 ENCOUNTER — Telehealth: Payer: Self-pay

## 2020-06-06 ENCOUNTER — Other Ambulatory Visit: Payer: Self-pay

## 2020-06-06 MED ORDER — LISINOPRIL 10 MG PO TABS: 2.0000 | ORAL_TABLET | Freq: Every day | ORAL | 1 refills | Status: DC

## 2020-06-06 NOTE — Telephone Encounter (Signed)
Refill of Lisinopril 10 mg sent to [facility].

## 2020-06-07 DIAGNOSIS — M5416 Radiculopathy, lumbar region: Secondary | ICD-10-CM | POA: Diagnosis not present

## 2020-06-07 MED ORDER — METHOCARBAMOL 500 MG PO TABS: 500.0000 mg | ORAL_TABLET | Freq: Four times a day (QID) | ORAL | 1 refills | Status: DC | PRN

## 2020-06-07 MED ORDER — METFORMIN HCL 500 MG PO TABS: ORAL_TABLET | ORAL | 0 refills | Status: DC

## 2020-06-07 MED ORDER — PANTOPRAZOLE SODIUM 40 MG PO TBEC: 1.0000 | DELAYED_RELEASE_TABLET | Freq: Every day | ORAL | 1 refills | Status: DC

## 2020-06-07 MED ORDER — GABAPENTIN 600 MG PO TABS: 600.0000 mg | ORAL_TABLET | Freq: Three times a day (TID) | ORAL | 1 refills | Status: DC

## 2020-06-07 MED ORDER — ATORVASTATIN CALCIUM 40 MG PO TABS: ORAL_TABLET | ORAL | 1 refills | Status: DC

## 2020-06-08 ENCOUNTER — Other Ambulatory Visit: Payer: Self-pay

## 2020-06-10 ENCOUNTER — Other Ambulatory Visit: Payer: Self-pay | Admitting: Physician Assistant

## 2020-06-13 ENCOUNTER — Ambulatory Visit: Payer: Medicare PPO | Admitting: Family Medicine

## 2020-06-13 MED ORDER — GABAPENTIN 600 MG PO TABS: 600.0000 mg | ORAL_TABLET | Freq: Three times a day (TID) | ORAL | 1 refills | Status: DC

## 2020-06-13 MED ORDER — PANTOPRAZOLE SODIUM 40 MG PO TBEC: 1.0000 | DELAYED_RELEASE_TABLET | Freq: Every day | ORAL | 1 refills | Status: DC

## 2020-06-13 MED ORDER — METFORMIN HCL 500 MG PO TABS: ORAL_TABLET | ORAL | 1 refills | Status: DC

## 2020-06-13 MED ORDER — ATORVASTATIN CALCIUM 40 MG PO TABS: ORAL_TABLET | ORAL | 1 refills | Status: DC

## 2020-06-13 NOTE — Telephone Encounter (Signed)
Pt needs prescriptions sent to mail order.

## 2020-06-13 NOTE — Addendum Note (Signed)
Addended by: Alonna Minium on: 06/13/2020 03:05 PM   Modules accepted: Orders

## 2020-07-03 DIAGNOSIS — M25559 Pain in unspecified hip: Secondary | ICD-10-CM

## 2020-07-03 DIAGNOSIS — M5416 Radiculopathy, lumbar region: Secondary | ICD-10-CM | POA: Diagnosis not present

## 2020-07-03 HISTORY — DX: Pain in unspecified hip: M25.559

## 2020-07-04 ENCOUNTER — Ambulatory Visit: Payer: Medicare PPO | Admitting: Cardiology

## 2020-07-04 ENCOUNTER — Other Ambulatory Visit: Payer: Self-pay

## 2020-07-04 ENCOUNTER — Encounter: Payer: Self-pay | Admitting: Cardiology

## 2020-07-04 VITALS — BP 122/70 | HR 74 | Ht 71.0 in | Wt 194.8 lb

## 2020-07-04 DIAGNOSIS — I2510 Atherosclerotic heart disease of native coronary artery without angina pectoris: Secondary | ICD-10-CM | POA: Diagnosis not present

## 2020-07-04 DIAGNOSIS — I10 Essential (primary) hypertension: Secondary | ICD-10-CM | POA: Diagnosis not present

## 2020-07-04 DIAGNOSIS — E782 Mixed hyperlipidemia: Secondary | ICD-10-CM

## 2020-07-04 DIAGNOSIS — E088 Diabetes mellitus due to underlying condition with unspecified complications: Secondary | ICD-10-CM

## 2020-07-04 NOTE — Progress Notes (Signed)
Cardiology Office Note:    Date:  07/04/2020   ID:  [name], DOB December 10, 1940, MRN [id number]  PCP:  Oscar Brome, MD  Cardiologist:  Oscar Lindau, MD   Referring MD: Oscar Brome, MD    ASSESSMENT:    1. Coronary artery disease involving native coronary artery of native heart without angina pectoris   2. Essential (primary) hypertension   3. Mixed hyperlipidemia   4. Diabetes mellitus due to underlying condition with unspecified complications ([place])    PLAN:    In order of problems listed above:  1. Coronary artery disease: Secondary prevention stressed with the patient.  Importance of compliance with diet medication stressed and he vocalized understanding.  When he is able he was advised to start walking on a regular basis in a graded fashion. 2. Essential hypertension: Blood pressure stable and diet was emphasized. 3. Mixed dyslipidemia: I wanted to do blood work but he has an appointment coming up next week with his primary care and we will do all blood work including fasting lipids and send me a copy. 4. Diabetes mellitus: Diet was emphasized.  He is followed by primary care for this.Patient will be seen in follow-up appointment in 6 months or earlier if the patient has any concerns    Medication Adjustments/Labs and Tests Ordered: Current medicines are reviewed at length with the patient today.  Concerns regarding medicines are outlined above.  No orders of the defined types were placed in this encounter.  No orders of the defined types were placed in this encounter.    No chief complaint on file.    History of Present Illness:    [name] is a 80 y.o. male.  Patient has past medical history of nonobstructive coronary artery disease, essential hypertension dyslipidemia and diabetes mellitus.  He recently has had back surgery about a month ago and recovering.  He denies any chest pain orthopnea or PND.  At the time of my evaluation, the patient is alert  awake oriented and in no distress.  Past Medical History:  Diagnosis Date  . Angina pectoris ([place]) 08/02/2019  . Arthritis    on meds  . Atrial fibrillation ([place])   . Backache 08/17/2012  . Body mass index (BMI) 26.0-26.9, adult 05/08/2020  . Cancer ([place]) 10/2015   prostate cancer treated with radiation  . Carpal tunnel syndrome 01/18/2018  . Cataract    sx-bilaterally  . Cervical spondylosis without myelopathy 09/24/2015  . Chest tightness 06/28/2019  . Chronic back pain 05/30/2013  . Colitis 11/22/2018  . 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 Depression 04/04/2019  . Diabetes mellitus due to underlying condition with unspecified complications ([place]) 0/99/8338  . Diabetes mellitus without complication ([place])    Type II  . Diarrhea 11/21/2018  . Dyslipidemia 06/17/2016  . Dyspnea on exertion 06/28/2019  . Dysrhythmia   . Essential (primary) hypertension 06/17/2016  . Essential hypertension 06/17/2016  . GERD (gastroesophageal reflux disease)    on meds  . Greater trochanteric pain syndrome 07/03/2020  . Headache   . History of hiatal hernia   . History of kidney stones   . History of prostate cancer 05/20/2019  . Hypercholesteremia 04/04/2019  . Hyperlipemia   . Hypertension   . Impaired fasting blood sugar 05/20/2019  . Impingement syndrome of shoulder region 01/12/2018  . Low back pain 02/20/2014  . 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  .  Lumbar radiculopathy 05/24/2019  . Malignant neoplasm prostate ([place]) 05/20/2019  . Medicare annual wellness visit, subsequent 01/16/2020  . Mixed hyperlipidemia 08/31/2019  . Neck pain 07/03/2015  . Paresthesia of hand 01/12/2018  . Paresthesia of upper limb 01/12/2018  . Paroxysmal atrial fibrillation ([place]) 04/04/2019  . PONV (postoperative nausea and vomiting)   . 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 S/P lumbar fusion 12/09/2019  . S/P lumbar  spinal fusion 09/29/2013  . Sepsis ([place]) 11/22/2018  . Shoulder pain 01/12/2018  . Spinal 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 01/26/2018  . Stenosis of intervertebral foramina 01/12/2018  . Trochanteric bursitis of right hip 04/19/2020    Past Surgical History:  Procedure Laterality Date  . anal fissures    . ANTERIOR CERVICAL DECOMP/DISCECTOMY FUSION N/A 03/04/2018   Procedure: Anterior Cervical Decompression Fusion - Cervical three-Cervical four - Cervical four-Cervical five;  Surgeon: Oscar Moore, MD;  Location: [facility];  Service: Neurosurgery;  Laterality: N/A;  . ANTERIOR LAT LUMBAR FUSION N/A 05/30/2020   Procedure: Anterior Lateral Lumbar Fusion Lumbar One-Two with Lateral Plate Removal of pedicle screws Lumbar One and Posterior Lateral Fusion Lumbar One-Lumbar Three;  Surgeon: Oscar Moore, MD;  Location: [facility];  Service: Neurosurgery;  Laterality: N/A;  . BACK SURGERY    . CARDIAC CATHETERIZATION     no PCI  . CARPAL TUNNEL RELEASE Left 03/04/2018   Procedure: Carpal Tunnel Release - left;  Surgeon: Oscar Moore, MD;  Location: [facility];  Service: Neurosurgery;  Laterality: Left;  . CHOLECYSTECTOMY    . COLONOSCOPY W/ POLYPECTOMY  08/30/2012   Colonic polyp status post polypectomy.  Pancolonic diverticulosis predominantly in the sigmoid colon. Small internal hemorrhoids. Moderate internal hemorhroids.   . ESOPHAGOGASTRODUODENOSCOPY  08/04/2016   Schatzkis ring status post esophageal dilatation. Small hiatal hernia. Mild gastritis.   Marland Kitchen EYE SURGERY     cataract removal bilateral eyes  . FOOT SURGERY     left heel  . HARDWARE REMOVAL N/A 05/30/2020   Procedure: HARDWARE REMOVAL;  Surgeon: Oscar Moore, MD;  Location: [facility];  Service: Neurosurgery;  Laterality: N/A;  . HERNIA REPAIR    . LAMINECTOMY WITH POSTERIOR LATERAL ARTHRODESIS LEVEL 2 N/A 12/09/2019   Procedure: Laminectomy and Foraminotomy - Lumbar one-two, Lumbar two-three, posterolateral instrumented fusion Lumbar one-three,  removal of instrumentation Lumbar three-five;  Surgeon: Oscar Moore, MD;  Location: [facility];  Service: Neurosurgery;  Laterality: N/A;  . LEFT HEART CATH AND CORONARY ANGIOGRAPHY N/A 08/03/2019   Procedure: LEFT HEART CATH AND CORONARY ANGIOGRAPHY;  Surgeon: Jettie Booze, MD;  Location: [facility];  Service: Cardiovascular;  Laterality: N/A;  . SHOULDER SURGERY     bilateral  . WISDOM TOOTH EXTRACTION      Current Medications: Current Meds  Medication Sig  . aspirin EC 81 MG tablet Take 1 tablet (81 mg total) by mouth daily.  Marland Kitchen atorvastatin (LIPITOR) 40 MG tablet Take 40 mg by mouth daily.  . benzonatate (TESSALON) 200 MG capsule Take 1 capsule (200 mg total) by mouth 3 (three) times daily as needed for cough.  . carboxymethylcellulose (REFRESH PLUS) 0.5 % SOLN Place 1 drop into both eyes 3 (three) times daily as needed for dry eyes (dry/irritated eyes).  . cholecalciferol (VITAMIN D) 1000 UNITS tablet Take 1,000 Units by mouth in the morning.  . gabapentin (NEURONTIN) 600 MG tablet Take 1 tablet (600 mg total) by mouth 3 (three) times daily.  Marland Kitchen HYDROcodone-acetaminophen (NORCO) 10-325 MG tablet  Take 1 tablet by mouth every 4 (four) hours as needed for moderate pain ((score 4 to 6)).  Marland Kitchen lisinopril (ZESTRIL) 10 MG tablet Take 2 tablets (20 mg total) by mouth daily.  . metFORMIN (GLUCOPHAGE) 500 MG tablet Take 500 mg by mouth daily.  . methocarbamol (ROBAXIN) 500 MG tablet Take 500 mg by mouth every 6 (six) hours as needed for muscle spasms.  . nitroGLYCERIN (NITROSTAT) 0.4 MG SL tablet Place 1 tablet (0.4 mg total) under the tongue every 5 (five) minutes x 3 doses as needed for chest pain.  . Omega-3 Fatty Acids (FISH OIL) 1200 MG CAPS Take 1,200 mg by mouth in the morning.  . pantoprazole (PROTONIX) 40 MG tablet Take 1 tablet (40 mg total) by mouth daily.  . tamsulosin (FLOMAX) 0.4 MG CAPS capsule Take 0.4 mg by mouth daily after supper.      Allergies:   Oxycontin  [oxycodone]   Social History   Socioeconomic History  . Marital status: Widowed    Spouse name: Not on file  . Number of children: 2  . Years of education: Not on file  . Highest education level: Not on file  Occupational History  . Not on file  Tobacco Use  . Smoking status: Former Smoker    Packs/day: 0.50    Years: 35.00    Pack years: 17.50    Types: Cigarettes    Quit date: 02/10/1993    Years since quitting: 27.4  . Smokeless tobacco: Former Systems developer    Types: [place] date: 02/10/2006  Vaping Use  . Vaping Use: Never used  Substance and Sexual Activity  . Alcohol use: Not Currently    Alcohol/week: 1.0 standard drink    Types: 1 Cans of beer per week    Comment: occ  . Drug use: No  . Sexual activity: Yes    Partners: Female  Other Topics Concern  . Not on file  Social History Narrative  . Not on file   Social Determinants of Health   Financial Resource Strain: Not on file  Food Insecurity: Not on file  Transportation Needs: Not on file  Physical Activity: Not on file  Stress: Not on file  Social Connections: Not on file     Family History: The patient's family history is negative for Colon cancer, Esophageal cancer, Rectal cancer, Stomach cancer, and Colon polyps.  ROS:   Please see the history of present illness.    All other systems reviewed and are negative.  EKGs/Labs/Other Studies Reviewed:    The following studies were reviewed today: I discussed my findings with the patient at length and coronary angiography report was reviewed   Recent Labs: 08/31/2019: TSH 3.000 12/14/2019: ALT 17 05/29/2020: BUN 26; Creatinine, Ser 1.03; Hemoglobin 15.5; Platelets 194; Potassium 4.9; Sodium 140  Recent Lipid Panel    Component Value Date/Time   CHOL 106 12/02/2019 0830   TRIG 100 12/02/2019 0830   HDL 43 12/02/2019 0830   CHOLHDL 2.5 12/02/2019 0830   CHOLHDL 2.6 01/24/2017 0521   VLDL 15 01/24/2017 0521   LDLCALC 44 12/02/2019 0830    Physical  Exam:    VS:  BP 122/70   Pulse 74   Ht 5\' 11"  (1.803 m)   Wt 194 lb 12.8 oz (88.4 kg)   SpO2 97%   BMI 27.17 kg/m     Wt Readings from Last 3 Encounters:  07/04/20 194 lb 12.8 oz (88.4 kg)  05/30/20 184 lb (  83.5 kg)  05/29/20 192 lb (87.1 kg)     GEN: Patient is in no acute distress HEENT: Normal NECK: No JVD; No carotid bruits LYMPHATICS: No lymphadenopathy CARDIAC: Hear sounds regular, 2/6 systolic murmur at the apex. RESPIRATORY:  Clear to auscultation without rales, wheezing or rhonchi  ABDOMEN: Soft, non-tender, non-distended MUSCULOSKELETAL:  No edema; No deformity  SKIN: Warm and dry NEUROLOGIC:  Alert and oriented x 3 PSYCHIATRIC:  Normal affect   Signed, Oscar Lindau, MD  07/04/2020 1:09 PM    [facility]

## 2020-07-04 NOTE — Patient Instructions (Signed)

## 2020-07-11 ENCOUNTER — Encounter: Payer: Self-pay | Admitting: Family Medicine

## 2020-07-11 ENCOUNTER — Other Ambulatory Visit: Payer: Self-pay

## 2020-07-11 ENCOUNTER — Ambulatory Visit: Payer: Medicare PPO | Admitting: Family Medicine

## 2020-07-11 ENCOUNTER — Ambulatory Visit (INDEPENDENT_AMBULATORY_CARE_PROVIDER_SITE_OTHER): Payer: Medicare PPO

## 2020-07-11 VITALS — BP 120/80 | HR 78 | Temp 97.3°F | Resp 18 | Ht 71.0 in | Wt 193.6 lb

## 2020-07-11 DIAGNOSIS — M545 Low back pain, unspecified: Secondary | ICD-10-CM

## 2020-07-11 DIAGNOSIS — Z23 Encounter for immunization: Secondary | ICD-10-CM | POA: Diagnosis not present

## 2020-07-11 DIAGNOSIS — K219 Gastro-esophageal reflux disease without esophagitis: Secondary | ICD-10-CM

## 2020-07-11 DIAGNOSIS — K224 Dyskinesia of esophagus: Secondary | ICD-10-CM

## 2020-07-11 DIAGNOSIS — I119 Hypertensive heart disease without heart failure: Secondary | ICD-10-CM

## 2020-07-11 DIAGNOSIS — E782 Mixed hyperlipidemia: Secondary | ICD-10-CM | POA: Diagnosis not present

## 2020-07-11 DIAGNOSIS — I2510 Atherosclerotic heart disease of native coronary artery without angina pectoris: Secondary | ICD-10-CM | POA: Diagnosis not present

## 2020-07-11 DIAGNOSIS — E1169 Type 2 diabetes mellitus with other specified complication: Secondary | ICD-10-CM | POA: Diagnosis not present

## 2020-07-11 LAB — POCT UA - MICROALBUMIN: Microalbumin Ur, POC: 30 mg/L

## 2020-07-11 MED ORDER — LISINOPRIL 20 MG PO TABS: 20.0000 mg | ORAL_TABLET | Freq: Every day | ORAL | 1 refills | Status: DC

## 2020-07-11 NOTE — Progress Notes (Signed)
Subjective:  Patient ID: [name], male    DOB: [birth year]  Age: 80 y.o. MRN: [id number]  Chief Complaint  Patient presents with  . Diabetes  . Hypertension    HPI 80 yo male presents 5 weeks postop from lumbar surgery for follow up of DM, HTN.  Patient on gabapentin 600 mg 3 times a day.  DM: last a1c was 6.2.  Patient not eating healthy nor is he exercising.  He does not check his sugars.He is having some numbness on his right lower lateral leg since his back surgery.  Currently on metformin 500 mg once daily.  Hyperlipidemia: Currently taking Lipitor 40 mg once daily.  OTC fish oil 1200 mg daily.  Hypertensive heart disease: Currently on lisinopril 10 mg 2 tablets daily, aspirin 81 mg once daily.Marland Kitchen  GERD: On Protonix 40 mg once daily.  Well-controlled.  Also for esophageal spasm.  BPH: On Flomax 0.4 mg once daily at night.  Current Outpatient Medications on File Prior to Visit  Medication Sig Dispense Refill  . aspirin EC 81 MG tablet Take 1 tablet (81 mg total) by mouth daily. 30 tablet 11  . atorvastatin (LIPITOR) 40 MG tablet Take 40 mg by mouth daily.    . carboxymethylcellulose (REFRESH PLUS) 0.5 % SOLN Place 1 drop into both eyes 3 (three) times daily as needed for dry eyes (dry/irritated eyes).    . cholecalciferol (VITAMIN D) 1000 UNITS tablet Take 1,000 Units by mouth in the morning.    . gabapentin (NEURONTIN) 600 MG tablet Take 1 tablet (600 mg total) by mouth 3 (three) times daily. 270 tablet 1  . HYDROcodone-acetaminophen (NORCO) 10-325 MG tablet Take 1 tablet by mouth every 4 (four) hours as needed for moderate pain ((score 4 to 6)). 30 tablet 0  . metFORMIN (GLUCOPHAGE) 500 MG tablet Take 500 mg by mouth daily.    . nitroGLYCERIN (NITROSTAT) 0.4 MG SL tablet Place 1 tablet (0.4 mg total) under the tongue every 5 (five) minutes x 3 doses as needed for chest pain. 25 tablet 12  . Omega-3 Fatty Acids (FISH OIL) 1200 MG CAPS Take 1,200 mg by mouth in the morning.     . pantoprazole (PROTONIX) 40 MG tablet Take 1 tablet (40 mg total) by mouth daily. 90 tablet 1  . tamsulosin (FLOMAX) 0.4 MG CAPS capsule Take 0.4 mg by mouth daily after supper.   1   No current facility-administered medications on file prior to visit.   Past Medical History:  Diagnosis Date  . Angina pectoris ([place]) 08/02/2019  . Arthritis    on meds  . Atrial fibrillation ([place])   . Backache 08/17/2012  . Body mass index (BMI) 26.0-26.9, adult 05/08/2020  . Cancer ([place]) 10/2015   prostate cancer treated with radiation  . Carpal tunnel syndrome 01/18/2018  . Cataract    sx-bilaterally  . Cervical spondylosis without myelopathy 09/24/2015  . Chest tightness 06/28/2019  . Chronic back pain 05/30/2013  . Colitis 11/22/2018  . 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 Depression 04/04/2019  . Diabetes mellitus due to underlying condition with unspecified complications ([place]) 9/51/8841  . Diabetes mellitus without complication ([place])    Type II  . Diarrhea 11/21/2018  . Dyslipidemia 06/17/2016  . Dyspnea on exertion 06/28/2019  . Dysrhythmia   . Essential (primary) hypertension 06/17/2016  . Essential hypertension 06/17/2016  . GERD (gastroesophageal reflux disease)  on meds  . Greater trochanteric pain syndrome 07/03/2020  . Headache   . History of hiatal hernia   . History of kidney stones   . History of prostate cancer 05/20/2019  . Hypercholesteremia 04/04/2019  . Hyperlipemia   . Hypertension   . Impaired fasting blood sugar 05/20/2019  . Impingement syndrome of shoulder region 01/12/2018  . Low back pain 02/20/2014  . 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  . Lumbar radiculopathy 05/24/2019  . Malignant neoplasm prostate ([place]) 05/20/2019  . Medicare annual wellness visit, subsequent 01/16/2020  . Mixed hyperlipidemia 08/31/2019  . Neck pain 07/03/2015  . Paresthesia of hand 01/12/2018   . Paresthesia of upper limb 01/12/2018  . Paroxysmal atrial fibrillation ([place]) 04/04/2019  . PONV (postoperative nausea and vomiting)   . 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 S/P lumbar fusion 12/09/2019  . S/P lumbar spinal fusion 09/29/2013  . Sepsis ([place]) 11/22/2018  . Shoulder pain 01/12/2018  . Spinal 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 01/26/2018  . Stenosis of intervertebral foramina 01/12/2018  . Trochanteric bursitis of right hip 04/19/2020   Past Surgical History:  Procedure Laterality Date  . anal fissures    . ANTERIOR CERVICAL DECOMP/DISCECTOMY FUSION N/A 03/04/2018   Procedure: Anterior Cervical Decompression Fusion - Cervical three-Cervical four - Cervical four-Cervical five;  Surgeon: Eustace Moore, MD;  Location: [facility];  Service: Neurosurgery;  Laterality: N/A;  . ANTERIOR LAT LUMBAR FUSION N/A 05/30/2020   Procedure: Anterior Lateral Lumbar Fusion Lumbar One-Two with Lateral Plate Removal of pedicle screws Lumbar One and Posterior Lateral Fusion Lumbar One-Lumbar Three;  Surgeon: Eustace Moore, MD;  Location: [facility];  Service: Neurosurgery;  Laterality: N/A;  . BACK SURGERY    . CARDIAC CATHETERIZATION     no PCI  . CARPAL TUNNEL RELEASE Left 03/04/2018   Procedure: Carpal Tunnel Release - left;  Surgeon: Eustace Moore, MD;  Location: [facility];  Service: Neurosurgery;  Laterality: Left;  . CHOLECYSTECTOMY    . COLONOSCOPY W/ POLYPECTOMY  08/30/2012   Colonic polyp status post polypectomy.  Pancolonic diverticulosis predominantly in the sigmoid colon. Small internal hemorrhoids. Moderate internal hemorhroids.   . ESOPHAGOGASTRODUODENOSCOPY  08/04/2016   Schatzkis ring status post esophageal dilatation. Small hiatal hernia. Mild gastritis.   Marland Kitchen EYE SURGERY     cataract removal bilateral eyes  . FOOT SURGERY     left heel  . HARDWARE REMOVAL N/A 05/30/2020   Procedure: HARDWARE REMOVAL;  Surgeon: Eustace Moore, MD;  Location: [facility];  Service: Neurosurgery;  Laterality: N/A;  .  HERNIA REPAIR    . LAMINECTOMY WITH POSTERIOR LATERAL ARTHRODESIS LEVEL 2 N/A 12/09/2019   Procedure: Laminectomy and Foraminotomy - Lumbar one-two, Lumbar two-three, posterolateral instrumented fusion Lumbar one-three, removal of instrumentation Lumbar three-five;  Surgeon: Eustace Moore, MD;  Location: [facility];  Service: Neurosurgery;  Laterality: N/A;  . LEFT HEART CATH AND CORONARY ANGIOGRAPHY N/A 08/03/2019   Procedure: LEFT HEART CATH AND CORONARY ANGIOGRAPHY;  Surgeon: Jettie Booze, MD;  Location: [facility];  Service: Cardiovascular;  Laterality: N/A;  . SHOULDER SURGERY     bilateral  . WISDOM TOOTH EXTRACTION      Family History  Problem Relation Age of Onset  . Colon cancer Neg Hx   . Esophageal cancer Neg Hx   . Rectal cancer Neg Hx   . Stomach cancer Neg Hx   . Colon polyps Neg Hx    Social History   Socioeconomic History  .  Marital status: Widowed    Spouse name: Not on file  . Number of children: 2  . Years of education: Not on file  . Highest education level: Not on file  Occupational History  . Not on file  Tobacco Use  . Smoking status: Former Smoker    Packs/day: 0.50    Years: 35.00    Pack years: 17.50    Types: Cigarettes    Quit date: 02/10/1993    Years since quitting: 27.4  . Smokeless tobacco: Former Systems developer    Types: [place] date: 02/10/2006  Vaping Use  . Vaping Use: Never used  Substance and Sexual Activity  . Alcohol use: Not Currently    Alcohol/week: 1.0 standard drink    Types: 1 Cans of beer per week    Comment: occ  . Drug use: No  . Sexual activity: Yes    Partners: Female  Other Topics Concern  . Not on file  Social History Narrative  . Not on file   Social Determinants of Health   Financial Resource Strain: Not on file  Food Insecurity: Not on file  Transportation Needs: Not on file  Physical Activity: Not on file  Stress: Not on file  Social Connections: Not on file    Review of Systems  Constitutional:  Positive for unexpected weight change. Negative for chills and fever.  HENT: Negative for congestion, rhinorrhea and sore throat.   Respiratory: Positive for shortness of breath (with exertion). Negative for cough.   Cardiovascular: Negative for chest pain and palpitations.  Gastrointestinal: Negative for abdominal pain, constipation, diarrhea, nausea and vomiting.  Endocrine: Positive for polyuria. Negative for polydipsia and polyphagia.  Genitourinary: Negative for dysuria and urgency.  Musculoskeletal: Positive for back pain (improving). Negative for arthralgias and myalgias.  Neurological: Positive for weakness. Negative for dizziness and headaches.  Psychiatric/Behavioral: Negative for dysphoric mood. The patient is not nervous/anxious.      Objective:  BP 120/80   Pulse 78   Temp (!) 97.3 F (36.3 C)   Resp 18   Ht 5\' 11"  (1.803 m)   Wt 193 lb 9.6 oz (87.8 kg)   BMI 27.00 kg/m   BP/Weight 07/11/2020 07/04/2020 4/40/1027  Systolic BP 253 664 403  Diastolic BP 80 70 89  Wt. (Lbs) 193.6 194.8 -  BMI 27 27.17 -    Physical Exam Vitals reviewed.  Constitutional:      Appearance: Normal appearance.  Neck:     Vascular: No carotid bruit.  Cardiovascular:     Rate and Rhythm: Normal rate and regular rhythm.     Heart sounds: Normal heart sounds.  Pulmonary:     Effort: Pulmonary effort is normal.     Breath sounds: Normal breath sounds. No wheezing, rhonchi or rales.  Abdominal:     General: Bowel sounds are normal.     Palpations: Abdomen is soft.     Tenderness: There is no abdominal tenderness.  Neurological:     Mental Status: He is alert.     Sensory: Sensory deficit (rt lateral lower leg ) present.  Psychiatric:        Mood and Affect: Mood normal.        Behavior: Behavior normal.     Diabetic Foot Exam - Simple   Simple Foot Form Diabetic Foot exam was performed with the following findings: Yes 07/11/2020  8:39 AM  Visual Inspection See comments:  Yes Sensation Testing Intact to touch and monofilament testing  bilaterally: Yes Pulse Check Posterior Tibialis and Dorsalis pulse intact bilaterally: Yes Comments Calluses medial to first mtp joints BL.       Lab Results  Component Value Date   WBC 5.7 07/11/2020   HGB 14.4 07/11/2020   HCT 43.2 07/11/2020   PLT 204 07/11/2020   GLUCOSE 102 (H) 07/11/2020   CHOL 102 07/11/2020   TRIG 110 07/11/2020   HDL 44 07/11/2020   LDLCALC 38 07/11/2020   ALT 19 07/11/2020   AST 17 07/11/2020   NA 146 (H) 07/11/2020   K 4.3 07/11/2020   CL 106 07/11/2020   CREATININE 0.76 07/11/2020   BUN 9 07/11/2020   CO2 23 07/11/2020   TSH 3.000 08/31/2019   INR 0.9 05/29/2020   HGBA1C 6.2 (H) 05/29/2020   MICROALBUR 30 07/11/2020      Assessment & Plan:   1. DM type 2 with diabetic mixed hyperlipidemia ([place]) Control: Well control. Recommend check sugars fasting daily. Recommend check feet daily. Recommend annual eye exams. Medicines: No changes. Continue to work on eating a healthy diet and exercise. Encouraged walking.  - POCT UA - Microalbumin - AMB Referral to [place] [facility]  2. Hypertensive heart disease without heart failure Change lisinopril from 10 mg 2 daily to 20 mg pill once daily. Continue to work on eating a healthy diet and exercise. Encouraged Walking. Labs drawn today.  - CBC with Differential/Platelet - Comprehensive metabolic panel - AMB Referral to [facility]  3. Mixed hyperlipidemia Well controlled.  No changes to medicines.  Continue to work on eating a healthy diet and exercise.  Labs drawn today.  - Lipid panel - AMB Referral to [facility]  4. Coronary artery disease involving native coronary artery of native heart without angina pectoris The current medical regimen is effective;  continue present plan and medications.  5. Gastroesophageal reflux disease without esophagitis The current medical regimen is  effective;  continue present plan and medications.  6. Esophageal spasm  The current medical regimen is effective;  continue present plan and medications.  7. Lumbar Back Pain - Gradually decrease gabapentin over the next month.  Start by decreasing to gabapentin to 600 mg one twice a day x 1 month. Decrease gabapentin 600 mg once daily. If Increased pain with tapering gabapentin, pt should increase dose back up.   Keep follow-up appointment with Dr. Ronnald Ramp.  Meds ordered this encounter  Medications  . lisinopril (ZESTRIL) 20 MG tablet    Sig: Take 1 tablet (20 mg total) by mouth daily.    Dispense:  90 tablet    Refill:  1    Orders Placed This Encounter  Procedures  . CBC with Differential/Platelet  . Comprehensive metabolic panel  . Lipid panel  . Cardiovascular Risk Assessment  . AMB Referral to [facility]  . POCT UA - Microalbumin    I,Haizley Cannella,acting as a scribe for Rochel Brome, MD.,have documented all relevant documentation on the behalf of Rochel Brome, MD,as directed by  Rochel Brome, MD while in the presence of Rochel Brome, MD.   Follow-up: Return in about 3 months (around 10/11/2020) for fasting.  An After Visit Summary was printed and given to the patient.  Rochel Brome, MD [facility] [phone]

## 2020-07-11 NOTE — Patient Instructions (Signed)
Gradually decrease gabapentin over the next month.  Keep follow-up appoint with Dr. Ronnald Ramp Recommend continue to work on eating healthy diet and exercise.  Encouraged walking. Change lisinopril from 10 mg 2 daily to 20 mg pill once daily.

## 2020-07-12 LAB — LIPID PANEL
Chol/HDL Ratio: 2.3 ratio (ref 0.0–5.0)
Cholesterol, Total: 102 mg/dL (ref 100–199)
HDL: 44 mg/dL (ref >39)
LDL Chol Calc (NIH): 38 mg/dL (ref 0–99)
Triglycerides: 110 mg/dL (ref 0–149)
VLDL Cholesterol Cal: 20 mg/dL (ref 5–40)

## 2020-07-12 LAB — COMPREHENSIVE METABOLIC PANEL
ALT: 19 IU/L (ref 0–44)
AST: 17 IU/L (ref 0–40)
Albumin/Globulin Ratio: 2.1 (ref 1.2–2.2)
Albumin: 4.4 g/dL (ref 3.6–4.6)
Alkaline Phosphatase: 119 IU/L (ref 44–121)
BUN/Creatinine Ratio: 12 (ref 10–24)
BUN: 9 mg/dL (ref 8–27)
Bilirubin Total: 1 mg/dL (ref 0.0–1.2)
CO2: 23 mmol/L (ref 20–29)
Calcium: 9.5 mg/dL (ref 8.6–10.2)
Chloride: 106 mmol/L (ref 96–106)
Creatinine, Ser: 0.76 mg/dL (ref 0.76–1.27)
Globulin, Total: 2.1 g/dL (ref 1.5–4.5)
Glucose: 102 mg/dL — ABNORMAL HIGH (ref 65–99)
Potassium: 4.3 mmol/L (ref 3.5–5.2)
Sodium: 146 mmol/L — ABNORMAL HIGH (ref 134–144)
Total Protein: 6.5 g/dL (ref 6.0–8.5)
eGFR: 90 mL/min/{1.73_m2} (ref >59)

## 2020-07-12 LAB — CBC WITH DIFFERENTIAL/PLATELET
Basophils Absolute: 0 10*3/uL (ref 0.0–0.2)
Basos: 0 %
EOS (ABSOLUTE): 0.1 10*3/uL (ref 0.0–0.4)
Eos: 2 %
Hematocrit: 43.2 % (ref 37.5–51.0)
Hemoglobin: 14.4 g/dL (ref 13.0–17.7)
Immature Grans (Abs): 0 10*3/uL (ref 0.0–0.1)
Immature Granulocytes: 0 %
Lymphocytes Absolute: 1.1 10*3/uL (ref 0.7–3.1)
Lymphs: 18 %
MCH: 30.7 pg (ref 26.6–33.0)
MCHC: 33.3 g/dL (ref 31.5–35.7)
MCV: 92 fL (ref 79–97)
Monocytes Absolute: 0.6 10*3/uL (ref 0.1–0.9)
Monocytes: 10 %
Neutrophils Absolute: 4 10*3/uL (ref 1.4–7.0)
Neutrophils: 70 %
Platelets: 204 10*3/uL (ref 150–450)
RBC: 4.69 x10E6/uL (ref 4.14–5.80)
RDW: 13.8 % (ref 11.6–15.4)
WBC: 5.7 10*3/uL (ref 3.4–10.8)

## 2020-07-12 LAB — CARDIOVASCULAR RISK ASSESSMENT

## 2020-07-14 DIAGNOSIS — S32009K Unspecified fracture of unspecified lumbar vertebra, subsequent encounter for fracture with nonunion: Secondary | ICD-10-CM | POA: Diagnosis not present

## 2020-07-18 ENCOUNTER — Telehealth: Payer: Self-pay | Admitting: Family Medicine

## 2020-07-18 ENCOUNTER — Other Ambulatory Visit: Payer: Self-pay

## 2020-07-18 DIAGNOSIS — L600 Ingrowing nail: Secondary | ICD-10-CM

## 2020-07-18 NOTE — Telephone Encounter (Signed)
Patient called needing a referral to [facility] for ingrown toenail, can you please put that in.

## 2020-07-18 NOTE — Telephone Encounter (Signed)
done

## 2020-08-01 ENCOUNTER — Encounter: Payer: Self-pay | Admitting: Sports Medicine

## 2020-08-01 ENCOUNTER — Other Ambulatory Visit: Payer: Self-pay

## 2020-08-01 ENCOUNTER — Ambulatory Visit: Payer: Medicare PPO | Admitting: Sports Medicine

## 2020-08-01 DIAGNOSIS — L600 Ingrowing nail: Secondary | ICD-10-CM

## 2020-08-01 DIAGNOSIS — M79675 Pain in left toe(s): Secondary | ICD-10-CM | POA: Diagnosis not present

## 2020-08-01 DIAGNOSIS — E119 Type 2 diabetes mellitus without complications: Secondary | ICD-10-CM

## 2020-08-01 NOTE — Patient Instructions (Signed)

## 2020-08-01 NOTE — Progress Notes (Signed)
Subjective: [name] is a 80 y.o. male patient presents to office today complaining of a moderately painful incurvated, medial nail border of the first toe on the left foot. This has been present for years but slowly getting worse has been going for pedicures without any complete improvement reports that it was hurting really bad that it was throbbing on last night.  Patient denies fever/chills/nausea/vomitting/any other related constitutional symptoms at this time.  Fasting blood sugar 110 this morning.  Review of Systems  All other systems reviewed and are negative.  Patient Active Problem List   Diagnosis Date Noted   Greater trochanteric pain syndrome 07/03/2020   Body mass index (BMI) 26.0-26.9, adult 05/08/2020   Trochanteric bursitis of right hip 04/19/2020   Medicare annual wellness visit, subsequent 01/16/2020   Arthritis    Atrial fibrillation ([place])    Cataract    Diabetes mellitus without complication ([place])    Dysrhythmia    GERD (gastroesophageal reflux disease)    Headache    History of hiatal hernia    History of kidney stones    Hyperlipemia    Hypertension    PONV (postoperative nausea and vomiting)    S/P lumbar fusion 12/09/2019   Mixed hyperlipidemia 08/31/2019   Angina pectoris ([place]) 08/02/2019   Diabetes mellitus due to underlying condition with unspecified complications ([place]) 12/75/1700   Dyspnea on exertion 06/28/2019   Chest tightness 06/28/2019   Radiculopathy, lumbar region 05/24/2019   Deficiency of other specified B group vitamins 05/20/2019   Impaired fasting blood sugar 05/20/2019   History of prostate cancer 05/20/2019   Malignant neoplasm prostate ([place]) 05/20/2019   Depression 04/04/2019   Hypercholesteremia 04/04/2019   Paroxysmal atrial fibrillation ([place]) 04/04/2019   Colitis 11/22/2018   Sepsis ([place]) 11/22/2018   Diarrhea 11/21/2018   S/P cervical spinal fusion 03/04/2018   Spinal 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 01/26/2018   Carpal  tunnel syndrome 01/18/2018   Impingement syndrome of shoulder region 01/12/2018   Paresthesia of hand 01/12/2018   Paresthesia of upper limb 01/12/2018   Shoulder pain 01/12/2018   Stenosis of intervertebral foramina 01/12/2018   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 30 tablet 11   atorvastatin (LIPITOR) 40 MG tablet Take 40 mg by mouth daily.     carboxymethylcellulose (REFRESH PLUS) 0.5 % SOLN Place 1 drop into both eyes 3 (three) times daily as needed for dry eyes (dry/irritated eyes).     cholecalciferol (VITAMIN D) 1000 UNITS tablet Take 1,000 Units by mouth in the morning.     gabapentin (NEURONTIN) 600 MG tablet Take 1 tablet (600 mg total) by mouth 3 (three) times daily. 270 tablet 1   HYDROcodone-acetaminophen (NORCO) 10-325 MG tablet Take 1 tablet by mouth every 4 (four) hours as needed for moderate pain ((score 4 to 6)). 30 tablet 0   lisinopril (ZESTRIL) 20 MG tablet Take 1 tablet (20 mg total) by mouth daily. 90 tablet 1   metFORMIN (GLUCOPHAGE)  500 MG tablet Take 500 mg by mouth daily.     nitroGLYCERIN (NITROSTAT) 0.4 MG SL tablet Place 1 tablet (0.4 mg total) under the tongue every 5 (five) minutes x 3 doses as needed for chest pain. 25 tablet 12   Omega-3 Fatty Acids (FISH OIL) 1200 MG CAPS Take 1,200 mg by mouth in the morning.     pantoprazole (PROTONIX) 40 MG tablet Take 1 tablet (40 mg total) by mouth daily. 90 tablet 1   tamsulosin  (FLOMAX) 0.4 MG CAPS capsule Take 0.4 mg by mouth daily after supper.   1   No current facility-administered medications on file prior to visit.    Allergies  Allergen Reactions   Oxycontin [Oxycodone] Other (See Comments)    loopy    Objective:  There were no vitals filed for this visit.  General: Well developed, nourished, in no acute distress, alert and oriented x3   Dermatology: Skin is warm, dry and supple bilateral.  Left hallux nail appears to be moderately incurvated with hyperkeratosis formation at the distal aspects of the medial nail border. (-)Erythema. (+) Edema. (-) serosanguous  drainage present. The remaining nails appear unremarkable at this time. There are no open sores, lesions or other signs of infection  present.  Vascular: Dorsalis Pedis artery and Posterior Tibial artery pedal pulses are 1/4 bilateral with immedate capillary fill time. Pedal hair growth present. No lower extremity edema varicosities noted bilateral.  Neruologic: Grossly intact via light touch bilateral.  Musculoskeletal: Tenderness to palpation of the left hallux medial nail fold.  Muscular strength within normal limits in all groups bilateral.   Assesement and Plan: Problem List Items Addressed This Visit       Endocrine   Diabetes mellitus without complication ([place])   Other Visit Diagnoses     Pain around toenail, left foot    -  Primary   Ingrown nail       Toe pain, left           -Discussed treatment alternatives and plan of care; Explained permanent/temporary nail avulsion and post procedure course to patient. Patient elects for PNA left hallux medial nail border with phenol - After a verbal and written consent, injected 3 ml of a 50:50 mixture of 2% plain  lidocaine and 0.5% plain marcaine in a normal hallux block fashion. Next, a  betadine prep was performed. Anesthesia was tested and found to be appropriate.  The offending left hallux medial nail border was then incised  from the hyponychium to the epinychium. The offending nail border was removed and cleared from the field. The area was curretted for any remaining nail or spicules. Phenol application performed and the area was then flushed with alcohol and dressed with antibiotic cream and a dry sterile dressing. -Patient was instructed to leave the dressing intact for today and begin soaking  in a weak solution of betadine or Epsom salt and water tomorrow. Patient was instructed to  soak for 15-20 minutes each day and apply neosporin/corticosporin and a gauze or bandaid dressing each day. -Patient was instructed to monitor the toe for signs of infection and return to office if toe becomes red, hot or swollen. -Advised ice, elevation, and tylenol or motrin if needed for pain.  -Patient is to return in 2-3 weeks for follow up care/nail check or sooner if problems arise.  Landis Martins, DPM

## 2020-08-14 DIAGNOSIS — G8929 Other chronic pain: Secondary | ICD-10-CM | POA: Diagnosis not present

## 2020-08-14 DIAGNOSIS — M5416 Radiculopathy, lumbar region: Secondary | ICD-10-CM | POA: Diagnosis not present

## 2020-08-14 DIAGNOSIS — M545 Low back pain, unspecified: Secondary | ICD-10-CM | POA: Diagnosis not present

## 2020-08-15 DIAGNOSIS — M6281 Muscle weakness (generalized): Secondary | ICD-10-CM | POA: Diagnosis not present

## 2020-08-15 DIAGNOSIS — I2510 Atherosclerotic heart disease of native coronary artery without angina pectoris: Secondary | ICD-10-CM | POA: Diagnosis not present

## 2020-08-15 DIAGNOSIS — I10 Essential (primary) hypertension: Secondary | ICD-10-CM | POA: Diagnosis not present

## 2020-08-15 DIAGNOSIS — R531 Weakness: Secondary | ICD-10-CM | POA: Diagnosis not present

## 2020-08-15 DIAGNOSIS — R5383 Other fatigue: Secondary | ICD-10-CM | POA: Diagnosis not present

## 2020-08-15 DIAGNOSIS — Z20828 Contact with and (suspected) exposure to other viral communicable diseases: Secondary | ICD-10-CM | POA: Diagnosis not present

## 2020-08-15 DIAGNOSIS — E119 Type 2 diabetes mellitus without complications: Secondary | ICD-10-CM | POA: Diagnosis not present

## 2020-08-15 DIAGNOSIS — N3289 Other specified disorders of bladder: Secondary | ICD-10-CM | POA: Diagnosis not present

## 2020-08-15 DIAGNOSIS — E7800 Pure hypercholesterolemia, unspecified: Secondary | ICD-10-CM | POA: Diagnosis not present

## 2020-08-15 DIAGNOSIS — I4891 Unspecified atrial fibrillation: Secondary | ICD-10-CM | POA: Diagnosis not present

## 2020-08-15 DIAGNOSIS — R5381 Other malaise: Secondary | ICD-10-CM | POA: Diagnosis not present

## 2020-08-20 DIAGNOSIS — M545 Low back pain, unspecified: Secondary | ICD-10-CM | POA: Diagnosis not present

## 2020-08-20 DIAGNOSIS — M2560 Stiffness of unspecified joint, not elsewhere classified: Secondary | ICD-10-CM | POA: Diagnosis not present

## 2020-08-20 DIAGNOSIS — R531 Weakness: Secondary | ICD-10-CM | POA: Diagnosis not present

## 2020-08-22 DIAGNOSIS — M2560 Stiffness of unspecified joint, not elsewhere classified: Secondary | ICD-10-CM | POA: Diagnosis not present

## 2020-08-22 DIAGNOSIS — R531 Weakness: Secondary | ICD-10-CM | POA: Diagnosis not present

## 2020-08-22 DIAGNOSIS — M545 Low back pain, unspecified: Secondary | ICD-10-CM | POA: Diagnosis not present

## 2020-08-23 ENCOUNTER — Ambulatory Visit: Payer: Medicare PPO | Admitting: Family Medicine

## 2020-08-23 ENCOUNTER — Other Ambulatory Visit: Payer: Self-pay

## 2020-08-23 VITALS — BP 128/82 | HR 74 | Temp 97.4°F | Ht 71.0 in | Wt 194.0 lb

## 2020-08-23 DIAGNOSIS — I160 Hypertensive urgency: Secondary | ICD-10-CM

## 2020-08-23 DIAGNOSIS — G4489 Other headache syndrome: Secondary | ICD-10-CM | POA: Diagnosis not present

## 2020-08-23 DIAGNOSIS — R42 Dizziness and giddiness: Secondary | ICD-10-CM

## 2020-08-23 MED ORDER — LISINOPRIL 40 MG PO TABS
40.0000 mg | ORAL_TABLET | Freq: Every day | ORAL | 0 refills | Status: DC
Start: 2020-08-23 — End: 2020-09-24

## 2020-08-23 NOTE — Progress Notes (Signed)
Subjective:  Patient ID: [name], male    DOB: March 09, 1940  Age: 80 y.o. MRN: [id number]  Chief Complaint  Patient presents with   Hospitalization Follow-up    HPI Patient was seen at [facility] ED on 08/15/2020. Presented with generalized weakness, fatigue, lightheadedness and a slight headache (that morning).  BP found to be high. Labs normal. CTA of chest: aortic atherosclerosis, cad, copd. Otherwise normal. Discharged with clonidine 0.1 mg once daily prn sbp >170.  Patient states since being d/c from [facility] his highest bp reading was 191/87 and lowest 116/66, HR 67-116. Was rx'd clonidine 0.1 mg however he has not taken any. States when his b/p is elevated he feels flushed, dizzy/swimmy headed, weak, has some SOB, has brain fog and chest feels like it is pounding. Symptoms tend to go away after resting for 30 minutes.  Current Outpatient Medications on File Prior to Visit  Medication Sig Dispense Refill   cloNIDine (CATAPRES) 0.1 MG tablet Take 0.1 mg by mouth as needed (Take when sbp 170).     aspirin EC 81 MG tablet Take 1 tablet (81 mg total) by mouth daily. 30 tablet 11   atorvastatin (LIPITOR) 40 MG tablet Take 40 mg by mouth daily.     carboxymethylcellulose (REFRESH PLUS) 0.5 % SOLN Place 1 drop into both eyes 3 (three) times daily as needed for dry eyes (dry/irritated eyes).     cholecalciferol (VITAMIN D) 1000 UNITS tablet Take 1,000 Units by mouth in the morning.     gabapentin (NEURONTIN) 600 MG tablet Take 1 tablet (600 mg total) by mouth 3 (three) times daily. 270 tablet 1   HYDROcodone-acetaminophen (NORCO) 10-325 MG tablet Take 1 tablet by mouth every 4 (four) hours as needed for moderate pain ((score 4 to 6)). 30 tablet 0   metFORMIN (GLUCOPHAGE) 500 MG tablet Take 500 mg by mouth daily.     nitroGLYCERIN (NITROSTAT) 0.4 MG SL tablet Place 1 tablet (0.4 mg total) under the tongue every 5 (five) minutes x 3 doses as needed for chest pain. 25 tablet 12   Omega-3 Fatty Acids  (FISH OIL) 1200 MG CAPS Take 1,200 mg by mouth in the morning.     pantoprazole (PROTONIX) 40 MG tablet Take 1 tablet (40 mg total) by mouth daily. 90 tablet 1   tamsulosin (FLOMAX) 0.4 MG CAPS capsule Take 0.4 mg by mouth daily after supper.   1   No current facility-administered medications on file prior to visit.   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 06/17/2016   Deficiency of other specified B group vitamins 05/20/2019   Depression 04/04/2019   Diabetes mellitus due to underlying condition with unspecified complications ([place]) 2/95/6213   Diabetes mellitus without complication ([place])    Type II   Diarrhea 11/21/2018   Dyslipidemia 06/17/2016   Dyspnea on exertion 06/28/2019   Dysrhythmia    Essential (primary) hypertension 06/17/2016   Essential hypertension 06/17/2016   GERD (gastroesophageal  reflux disease)    on meds   Greater trochanteric pain syndrome 07/03/2020   Headache    History of hiatal hernia    History of kidney stones    History of prostate cancer 05/20/2019   Hypercholesteremia 04/04/2019   Hyperlipemia    Hypertension    Impaired fasting blood sugar 05/20/2019   Impingement syndrome of shoulder region 01/12/2018   Low back pain 02/20/2014   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   Lumbar radiculopathy 05/24/2019   Malignant neoplasm prostate ([place]) 05/20/2019   Medicare annual wellness visit, subsequent 01/16/2020   Mixed hyperlipidemia 08/31/2019   Neck pain 07/03/2015   Paresthesia  of hand 01/12/2018   Paresthesia of upper limb 01/12/2018   Paroxysmal atrial fibrillation ([place]) 04/04/2019   PONV (postoperative nausea and vomiting)    S/P cervical spinal fusion 03/04/2018   S/P lumbar fusion 12/09/2019   S/P lumbar spinal fusion 09/29/2013   Sepsis ([place]) 11/22/2018   Shoulder pain 01/12/2018   Spinal 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 01/26/2018   Stenosis of intervertebral foramina 01/12/2018   Trochanteric bursitis of right hip 04/19/2020   Past Surgical History:  Procedure Laterality Date   anal fissures     ANTERIOR CERVICAL DECOMP/DISCECTOMY FUSION N/A 03/04/2018   Procedure: Anterior Cervical Decompression Fusion - Cervical three-Cervical four - Cervical four-Cervical five;  Surgeon: Eustace Moore, MD;  Location: [place];  Service: Neurosurgery;  Laterality: N/A;   ANTERIOR LAT LUMBAR FUSION N/A 05/30/2020   Procedure: Anterior Lateral Lumbar Fusion Lumbar One-Two with Lateral Plate Removal of pedicle screws Lumbar One and Posterior Lateral Fusion Lumbar One-Lumbar Three;  Surgeon: Eustace Moore, MD;  Location: [place];  Service: Neurosurgery;  Laterality: N/A;   BACK SURGERY     CARDIAC CATHETERIZATION     no PCI   CARPAL TUNNEL RELEASE Left 03/04/2018   Procedure: Carpal Tunnel Release - left;  Surgeon: Eustace Moore, MD;  Location: [place];  Service: Neurosurgery;  Laterality: Left;   CHOLECYSTECTOMY     COLONOSCOPY W/ POLYPECTOMY  08/30/2012   Colonic polyp status post polypectomy.  Pancolonic diverticulosis predominantly in the sigmoid colon. Small internal hemorrhoids. Moderate internal hemorhroids.    ESOPHAGOGASTRODUODENOSCOPY  08/04/2016   Schatzkis ring status post esophageal dilatation. Small hiatal hernia. Mild gastritis.    EYE SURGERY     cataract removal bilateral eyes   FOOT SURGERY     left heel   HARDWARE REMOVAL N/A 05/30/2020   Procedure: HARDWARE REMOVAL;  Surgeon: Eustace Moore, MD;  Location: [place];  Service: Neurosurgery;  Laterality: N/A;   HERNIA  REPAIR     LAMINECTOMY WITH POSTERIOR LATERAL ARTHRODESIS LEVEL 2 N/A 12/09/2019   Procedure: Laminectomy and Foraminotomy - Lumbar one-two, Lumbar two-three, posterolateral instrumented fusion Lumbar one-three, removal of instrumentation Lumbar three-five;  Surgeon: Eustace Moore, MD;  Location: [place];  Service: Neurosurgery;  Laterality: N/A;   LEFT HEART CATH AND CORONARY ANGIOGRAPHY N/A 08/03/2019   Procedure: LEFT HEART CATH AND CORONARY ANGIOGRAPHY;  Surgeon: Jettie Booze, MD;  Location: [facility];  Service: Cardiovascular;  Laterality: N/A;   SHOULDER SURGERY     bilateral   WISDOM TOOTH EXTRACTION      Family History  Problem Relation Age of Onset   Colon cancer Neg Hx    Esophageal cancer Neg Hx    Rectal cancer Neg Hx    Stomach cancer Neg Hx    Colon polyps Neg Hx    Social History  Socioeconomic History   Marital status: Widowed    Spouse name: Not on file   Number of children: 2   Years of education: Not on file   Highest education level: Not on file  Occupational History   Not on file  Tobacco Use   Smoking status: Former    Packs/day: 0.50    Years: 35.00    Pack years: 17.50    Types: Cigarettes    Quit date: 02/10/1993    Years since quitting: 27.5   Smokeless tobacco: Former    Types: Chew    Quit date: 02/10/2006  Vaping Use   Vaping Use: Never used  Substance and Sexual Activity   Alcohol use: Not Currently    Alcohol/week: 1.0 standard drink    Types: 1 Cans of beer per week    Comment: occ   Drug use: No   Sexual activity: Yes    Partners: Female  Other Topics Concern   Not on file  Social History Narrative   Not on file   Social Determinants of Health   Financial Resource Strain: Not on file  Food Insecurity: Not on file  Transportation Needs: Not on file  Physical Activity: Not on file  Stress: Not on file  Social Connections: Not on file    Review of Systems  Constitutional:  Negative for chills, diaphoresis and  fever.  HENT:  Negative for congestion, ear pain and sore throat.   Respiratory:  Positive for shortness of breath. Negative for cough.   Cardiovascular:  Negative for chest pain and leg swelling.  Gastrointestinal:  Negative for abdominal pain, constipation, diarrhea and nausea.  Neurological:  Positive for dizziness (Swimmy headed) and headaches.    Objective:  BP 128/82   Pulse 74   Temp (!) 97.4 F (36.3 C)   Ht 5\' 11"  (1.803 m)   Wt 194 lb (88 kg)   SpO2 99%   BMI 27.06 kg/m   BP/Weight 08/23/2020 07/11/2020 8/52/7782  Systolic BP 423 536 144  Diastolic BP 82 80 70  Wt. (Lbs) 194 193.6 194.8  BMI 27.06 27 27.17    Physical Exam Vitals reviewed.  Constitutional:      Appearance: Normal appearance.  Neck:     Vascular: No carotid bruit.  Cardiovascular:     Rate and Rhythm: Normal rate and regular rhythm.     Heart sounds: Normal heart sounds.  Pulmonary:     Effort: Pulmonary effort is normal.     Breath sounds: Normal breath sounds. No wheezing, rhonchi or rales.  Abdominal:     General: Bowel sounds are normal.     Palpations: Abdomen is soft.     Tenderness: There is no abdominal tenderness.  Neurological:     Mental Status: He is alert and oriented to person, place, and time.  Psychiatric:        Mood and Affect: Mood normal.        Behavior: Behavior normal.    Diabetic Foot Exam - Simple   No data filed      Lab Results  Component Value Date   WBC 5.7 07/11/2020   HGB 14.4 07/11/2020   HCT 43.2 07/11/2020   PLT 204 07/11/2020   GLUCOSE 102 (H) 07/11/2020   CHOL 102 07/11/2020   TRIG 110 07/11/2020   HDL 44 07/11/2020   LDLCALC 38 07/11/2020   ALT 19 07/11/2020   AST 17 07/11/2020   NA 146 (H) 07/11/2020   K 4.3 07/11/2020  CL 106 07/11/2020   CREATININE 0.76 07/11/2020   BUN 9 07/11/2020   CO2 23 07/11/2020   TSH 3.000 08/31/2019   INR 0.9 05/29/2020   HGBA1C 6.2 (H) 05/29/2020   MICROALBUR 30 07/11/2020      Assessment & Plan:    1. Hypertensive urgency 2. Dizziness 3. Other headache syndrome   Plan: BP is not at goal. Dizziness and  headache due to uncontrolled bp.  Increase lisinopril 40 mg once daily.  No repeat labs needed.  Reviewed ED records in depth, as well as cardiology notes.  Spent 30 minutes face to face and reviewing records.     Meds ordered this encounter  Medications   lisinopril (ZESTRIL) 40 MG tablet    Sig: Take 1 tablet (40 mg total) by mouth daily.    Dispense:  90 tablet    Refill:  0   Follow-up: Return in about 4 weeks (around 09/20/2020) for hypertension with Larene Beach, NP.  An After Visit Summary was printed and given to the patient.  Rochel Brome, MD [facility] [phone]

## 2020-08-23 NOTE — Patient Instructions (Signed)
Increase lisinopril to 40 mg once daily.

## 2020-08-24 ENCOUNTER — Ambulatory Visit: Payer: Medicare PPO | Admitting: Sports Medicine

## 2020-08-24 ENCOUNTER — Encounter: Payer: Self-pay | Admitting: Sports Medicine

## 2020-08-24 DIAGNOSIS — M2560 Stiffness of unspecified joint, not elsewhere classified: Secondary | ICD-10-CM | POA: Diagnosis not present

## 2020-08-24 DIAGNOSIS — M79675 Pain in left toe(s): Secondary | ICD-10-CM

## 2020-08-24 DIAGNOSIS — R531 Weakness: Secondary | ICD-10-CM | POA: Diagnosis not present

## 2020-08-24 DIAGNOSIS — E119 Type 2 diabetes mellitus without complications: Secondary | ICD-10-CM

## 2020-08-24 DIAGNOSIS — M545 Low back pain, unspecified: Secondary | ICD-10-CM | POA: Diagnosis not present

## 2020-08-24 DIAGNOSIS — Z9889 Other specified postprocedural states: Secondary | ICD-10-CM

## 2020-08-24 NOTE — Progress Notes (Signed)
Subjective: [name] is a 80 y.o. male patient returns to office for follow-up evaluation after having left medial hallux permanent nail avulsion performed on 08/01/2020.  Patient is assisted by wife who reports that the toe is healing well they stop soaking the last week once they got a scab to the area.  Patient denies any current pain or drainage.  Patient is diabetic but reports that Dr. Tobie Poet keeps up with his blood sugars he does not routinely check at home.  Patient Active Problem List   Diagnosis Date Noted   Greater trochanteric pain syndrome 07/03/2020   Body mass index (BMI) 26.0-26.9, adult 05/08/2020   Trochanteric bursitis of right hip 04/19/2020   Medicare annual wellness visit, subsequent 01/16/2020   Arthritis    Atrial fibrillation ([place])    Cataract    Diabetes mellitus without complication ([place])    Dysrhythmia    GERD (gastroesophageal reflux disease)    Headache    History of hiatal hernia    History of kidney stones    Hyperlipemia    Hypertension    PONV (postoperative nausea and vomiting)    S/P lumbar fusion 12/09/2019   Mixed hyperlipidemia 08/31/2019   Angina pectoris ([place]) 08/02/2019   Diabetes mellitus due to underlying condition with unspecified complications ([place]) 16/60/6301   Dyspnea on exertion 06/28/2019   Chest tightness 06/28/2019   Radiculopathy, lumbar region 05/24/2019   Deficiency of other specified B group vitamins 05/20/2019   Impaired fasting blood sugar 05/20/2019   History of prostate cancer 05/20/2019   Malignant neoplasm prostate ([place]) 05/20/2019   Depression 04/04/2019   Hypercholesteremia 04/04/2019   Paroxysmal atrial fibrillation ([place]) 04/04/2019   Colitis 11/22/2018   Sepsis ([place]) 11/22/2018   Diarrhea 11/21/2018   S/P cervical spinal fusion 03/04/2018   Spinal 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 01/26/2018   Carpal tunnel syndrome 01/18/2018   Impingement syndrome of shoulder region 01/12/2018   Paresthesia of hand 01/12/2018    Paresthesia of upper limb 01/12/2018   Shoulder pain 01/12/2018   Stenosis of intervertebral foramina 01/12/2018   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 30 tablet 11   atorvastatin (LIPITOR) 40 MG tablet Take 40 mg by mouth daily.     carboxymethylcellulose (REFRESH PLUS) 0.5 % SOLN Place 1 drop into both eyes 3 (three) times daily as needed for dry eyes (dry/irritated eyes).     cholecalciferol (VITAMIN D) 1000 UNITS tablet Take 1,000 Units by mouth in the morning.     cloNIDine (CATAPRES) 0.1 MG tablet Take 0.1 mg by mouth as needed (Take when sbp 170).     gabapentin (NEURONTIN) 600 MG tablet Take 1 tablet (600 mg total) by mouth 3 (three) times daily. 270 tablet 1   HYDROcodone-acetaminophen (NORCO) 10-325 MG tablet Take 1 tablet by mouth every 4 (four) hours as needed for moderate pain ((score 4 to 6)). 30 tablet 0   lisinopril (ZESTRIL) 40 MG tablet Take 1 tablet (40 mg  total) by mouth daily. 90 tablet 0   metFORMIN (GLUCOPHAGE) 500 MG tablet Take 500 mg by mouth daily.     nitroGLYCERIN (NITROSTAT) 0.4 MG SL tablet Place 1 tablet (0.4 mg total) under the tongue every 5 (five) minutes x 3 doses as needed for chest pain. 25 tablet 12   Omega-3 Fatty Acids (FISH OIL) 1200 MG CAPS Take 1,200 mg by mouth in the morning.     pantoprazole (PROTONIX) 40 MG tablet Take 1 tablet (40 mg total) by mouth daily. 90 tablet 1   tamsulosin (FLOMAX) 0.4 MG CAPS  capsule Take 0.4 mg by mouth daily after supper.   1   No current facility-administered medications on file prior to visit.    Allergies  Allergen Reactions   Oxycontin [Oxycodone] Other (See Comments)    loopy    Objective:  General: Well developed, nourished, in no acute distress, alert and oriented x3   Dermatology: Skin is warm, dry and supple bilateral.  Left hallux medial nail bed appears to be clean, dry, with mild granular tissue and surrounding eschar/scab. (-) Erythema. (-) Edema. (-) serosanguous drainage present. The remaining nails appear unremarkable at this time. There are no other lesions or other signs of infection  present.  Neurovascular status: Intact. No lower extremity swelling; No pain with calf compression bilateral.  Musculoskeletal: Decreased tenderness to palpation of the left hallux medial nail fold. Muscular strength within normal limits bilateral.   Assesement and Plan: Problem List Items Addressed This Visit       Endocrine   Diabetes mellitus without complication ([place])   Other Visit Diagnoses     S/P nail surgery    -  Primary   Toe pain, left           -Examined patient  -Left great toe well-healed with dry scab -Discussed plan of care with patient. -Patient may discontinue soaking May discontinue antibiotic cream and may discontinue Band-Aid -Patient was instructed to monitor the toe for reoccurrence and signs of infection; Patient advised to return to office or go to ER if toe becomes red, hot or swollen. -Patient is to return as needed or sooner if problems arise.  Landis Martins, DPM

## 2020-08-26 ENCOUNTER — Encounter: Payer: Self-pay | Admitting: Family Medicine

## 2020-08-27 DIAGNOSIS — M2560 Stiffness of unspecified joint, not elsewhere classified: Secondary | ICD-10-CM | POA: Diagnosis not present

## 2020-08-27 DIAGNOSIS — M545 Low back pain, unspecified: Secondary | ICD-10-CM | POA: Diagnosis not present

## 2020-08-27 DIAGNOSIS — R531 Weakness: Secondary | ICD-10-CM | POA: Diagnosis not present

## 2020-08-29 DIAGNOSIS — M545 Low back pain, unspecified: Secondary | ICD-10-CM | POA: Diagnosis not present

## 2020-08-29 DIAGNOSIS — R531 Weakness: Secondary | ICD-10-CM | POA: Diagnosis not present

## 2020-08-29 DIAGNOSIS — M2560 Stiffness of unspecified joint, not elsewhere classified: Secondary | ICD-10-CM | POA: Diagnosis not present

## 2020-08-31 DIAGNOSIS — R531 Weakness: Secondary | ICD-10-CM | POA: Diagnosis not present

## 2020-08-31 DIAGNOSIS — M2560 Stiffness of unspecified joint, not elsewhere classified: Secondary | ICD-10-CM | POA: Diagnosis not present

## 2020-08-31 DIAGNOSIS — M545 Low back pain, unspecified: Secondary | ICD-10-CM | POA: Diagnosis not present

## 2020-09-03 DIAGNOSIS — C61 Malignant neoplasm of prostate: Secondary | ICD-10-CM | POA: Diagnosis not present

## 2020-09-03 DIAGNOSIS — R350 Frequency of micturition: Secondary | ICD-10-CM | POA: Diagnosis not present

## 2020-09-03 DIAGNOSIS — M545 Low back pain, unspecified: Secondary | ICD-10-CM | POA: Diagnosis not present

## 2020-09-03 DIAGNOSIS — R531 Weakness: Secondary | ICD-10-CM | POA: Diagnosis not present

## 2020-09-03 DIAGNOSIS — M2560 Stiffness of unspecified joint, not elsewhere classified: Secondary | ICD-10-CM | POA: Diagnosis not present

## 2020-09-05 DIAGNOSIS — R531 Weakness: Secondary | ICD-10-CM | POA: Diagnosis not present

## 2020-09-05 DIAGNOSIS — M545 Low back pain, unspecified: Secondary | ICD-10-CM | POA: Diagnosis not present

## 2020-09-05 DIAGNOSIS — M2560 Stiffness of unspecified joint, not elsewhere classified: Secondary | ICD-10-CM | POA: Diagnosis not present

## 2020-09-07 DIAGNOSIS — M545 Low back pain, unspecified: Secondary | ICD-10-CM | POA: Diagnosis not present

## 2020-09-07 DIAGNOSIS — R531 Weakness: Secondary | ICD-10-CM | POA: Diagnosis not present

## 2020-09-07 DIAGNOSIS — M2560 Stiffness of unspecified joint, not elsewhere classified: Secondary | ICD-10-CM | POA: Diagnosis not present

## 2020-09-10 DIAGNOSIS — R531 Weakness: Secondary | ICD-10-CM | POA: Diagnosis not present

## 2020-09-10 DIAGNOSIS — M545 Low back pain, unspecified: Secondary | ICD-10-CM | POA: Diagnosis not present

## 2020-09-10 DIAGNOSIS — M2560 Stiffness of unspecified joint, not elsewhere classified: Secondary | ICD-10-CM | POA: Diagnosis not present

## 2020-09-12 DIAGNOSIS — R531 Weakness: Secondary | ICD-10-CM | POA: Diagnosis not present

## 2020-09-12 DIAGNOSIS — M2560 Stiffness of unspecified joint, not elsewhere classified: Secondary | ICD-10-CM | POA: Diagnosis not present

## 2020-09-12 DIAGNOSIS — M545 Low back pain, unspecified: Secondary | ICD-10-CM | POA: Diagnosis not present

## 2020-09-14 DIAGNOSIS — R531 Weakness: Secondary | ICD-10-CM | POA: Diagnosis not present

## 2020-09-14 DIAGNOSIS — M2560 Stiffness of unspecified joint, not elsewhere classified: Secondary | ICD-10-CM | POA: Diagnosis not present

## 2020-09-14 DIAGNOSIS — M545 Low back pain, unspecified: Secondary | ICD-10-CM | POA: Diagnosis not present

## 2020-09-17 DIAGNOSIS — M2560 Stiffness of unspecified joint, not elsewhere classified: Secondary | ICD-10-CM | POA: Diagnosis not present

## 2020-09-17 DIAGNOSIS — M545 Low back pain, unspecified: Secondary | ICD-10-CM | POA: Diagnosis not present

## 2020-09-17 DIAGNOSIS — R531 Weakness: Secondary | ICD-10-CM | POA: Diagnosis not present

## 2020-09-19 DIAGNOSIS — R531 Weakness: Secondary | ICD-10-CM | POA: Diagnosis not present

## 2020-09-19 DIAGNOSIS — M2560 Stiffness of unspecified joint, not elsewhere classified: Secondary | ICD-10-CM | POA: Diagnosis not present

## 2020-09-19 DIAGNOSIS — M545 Low back pain, unspecified: Secondary | ICD-10-CM | POA: Diagnosis not present

## 2020-09-24 ENCOUNTER — Other Ambulatory Visit: Payer: Self-pay

## 2020-09-24 ENCOUNTER — Encounter: Payer: Self-pay | Admitting: Family Medicine

## 2020-09-24 ENCOUNTER — Ambulatory Visit: Payer: Medicare PPO | Admitting: Family Medicine

## 2020-09-24 VITALS — BP 124/72 | HR 72 | Temp 97.5°F | Resp 16 | Ht 71.0 in | Wt 195.0 lb

## 2020-09-24 DIAGNOSIS — I110 Hypertensive heart disease with heart failure: Secondary | ICD-10-CM | POA: Diagnosis not present

## 2020-09-24 DIAGNOSIS — I5032 Chronic diastolic (congestive) heart failure: Secondary | ICD-10-CM

## 2020-09-24 DIAGNOSIS — M2560 Stiffness of unspecified joint, not elsewhere classified: Secondary | ICD-10-CM | POA: Diagnosis not present

## 2020-09-24 DIAGNOSIS — R531 Weakness: Secondary | ICD-10-CM | POA: Diagnosis not present

## 2020-09-24 DIAGNOSIS — M545 Low back pain, unspecified: Secondary | ICD-10-CM | POA: Diagnosis not present

## 2020-09-24 MED ORDER — LISINOPRIL 10 MG PO TABS: 10.0000 mg | ORAL_TABLET | Freq: Every day | ORAL | 0 refills | Status: DC

## 2020-09-24 NOTE — Patient Instructions (Signed)
Decrease lisinopril 10 mg once daily.

## 2020-09-24 NOTE — Progress Notes (Signed)
Acute Office Visit  Subjective:    Patient ID: [name], male    DOB: [birth year], 80 y.o.   MRN: [id number]  Chief Complaint  Patient presents with   Hypertension    HPI Patient is in today for Hypertension follow up. Seen in ED 7/6 for bp 204/98. No medication changes. He was discharged and followed up on 08/23/2020. He dropped lisinopril from 40 down to 20 mg daily again because his bp was too low. Last week dropped 88/58. This was 3-4 days after dropping lisinopril. Pt laid around that day and has felt fine since. Did not pass out. Did not experience chest pain, shortness of breath.   Past Medical History:  Diagnosis Date   Angina pectoris ([place]) 08/02/2019   Arthritis    on meds   Atrial fibrillation ([place])    Backache 08/17/2012   Body mass index (BMI) 26.0-26.9, adult 05/08/2020   Cancer ([place]) 10/2015   prostate cancer treated with radiation   Carpal tunnel syndrome 01/18/2018   Cataract    sx-bilaterally   Cervical spondylosis without myelopathy 09/24/2015   Chest tightness 06/28/2019   Chronic back pain 05/30/2013   Colitis 11/22/2018   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 06/17/2016   Deficiency of other specified B group vitamins 05/20/2019   Depression 04/04/2019   Diabetes mellitus due to underlying condition with unspecified complications ([place]) 7/49/4496   Diabetes mellitus without complication ([place])    Type II   Diarrhea 11/21/2018   Dyslipidemia 06/17/2016   Dyspnea on exertion 06/28/2019   Dysrhythmia    Essential (primary) hypertension 06/17/2016   Essential hypertension 06/17/2016   GERD (gastroesophageal reflux disease)    on meds   Greater trochanteric pain syndrome 07/03/2020   Headache    History of hiatal hernia    History of kidney stones    History of prostate cancer 05/20/2019   Hypercholesteremia 04/04/2019   Hyperlipemia    Hypertension    Impaired fasting blood sugar 05/20/2019   Impingement syndrome of  shoulder region 01/12/2018   Low back pain 02/20/2014   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   Lumbar radiculopathy 05/24/2019   Malignant neoplasm prostate ([place]) 05/20/2019   Medicare annual wellness visit, subsequent 01/16/2020   Mixed hyperlipidemia 08/31/2019   Neck pain 07/03/2015   Paresthesia of hand 01/12/2018   Paresthesia of upper limb 01/12/2018   Paroxysmal atrial fibrillation ([place]) 04/04/2019   PONV (postoperative nausea and vomiting)    S/P cervical spinal fusion 03/04/2018   S/P lumbar fusion 12/09/2019   S/P lumbar spinal fusion 09/29/2013   Sepsis ([place]) 11/22/2018   Shoulder pain 01/12/2018   Spinal 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 01/26/2018   Stenosis of intervertebral foramina 01/12/2018   Trochanteric bursitis of right hip 04/19/2020    Past Surgical History:  Procedure Laterality Date   anal fissures     ANTERIOR CERVICAL DECOMP/DISCECTOMY FUSION N/A 03/04/2018   Procedure: Anterior Cervical Decompression Fusion - Cervical three-Cervical four - Cervical four-Cervical five;  Surgeon: Eustace Moore, MD;  Location: [facility];  Service: Neurosurgery;  Laterality: N/A;   ANTERIOR LAT LUMBAR FUSION N/A 05/30/2020   Procedure: Anterior Lateral Lumbar Fusion Lumbar One-Two with Lateral Plate Removal of pedicle screws Lumbar One and Posterior Lateral Fusion Lumbar One-Lumbar Three;  Surgeon: Eustace Moore, MD;  Location: [facility];  Service: Neurosurgery;  Laterality: N/A;   BACK SURGERY     CARDIAC CATHETERIZATION     no PCI  CARPAL TUNNEL RELEASE Left 03/04/2018   Procedure: Carpal Tunnel Release - left;  Surgeon: Eustace Moore, MD;  Location: [facility];  Service: Neurosurgery;  Laterality: Left;   CHOLECYSTECTOMY     COLONOSCOPY W/ POLYPECTOMY  08/30/2012   Colonic polyp status post polypectomy.  Pancolonic diverticulosis predominantly in the sigmoid colon. Small internal hemorrhoids. Moderate internal hemorhroids.    ESOPHAGOGASTRODUODENOSCOPY  08/04/2016   Schatzkis ring status post esophageal  dilatation. Small hiatal hernia. Mild gastritis.    EYE SURGERY     cataract removal bilateral eyes   FOOT SURGERY     left heel   HARDWARE REMOVAL N/A 05/30/2020   Procedure: HARDWARE REMOVAL;  Surgeon: Eustace Moore, MD;  Location: [facility];  Service: Neurosurgery;  Laterality: N/A;   HERNIA REPAIR     LAMINECTOMY WITH POSTERIOR LATERAL ARTHRODESIS LEVEL 2 N/A 12/09/2019   Procedure: Laminectomy and Foraminotomy - Lumbar one-two, Lumbar two-three, posterolateral instrumented fusion Lumbar one-three, removal of instrumentation Lumbar three-five;  Surgeon: Eustace Moore, MD;  Location: [facility];  Service: Neurosurgery;  Laterality: N/A;   LEFT HEART CATH AND CORONARY ANGIOGRAPHY N/A 08/03/2019   Procedure: LEFT HEART CATH AND CORONARY ANGIOGRAPHY;  Surgeon: Jettie Booze, MD;  Location: [facility];  Service: Cardiovascular;  Laterality: N/A;   SHOULDER SURGERY     bilateral   WISDOM TOOTH EXTRACTION      Family History  Problem Relation Age of Onset   Colon cancer Neg Hx    Esophageal cancer Neg Hx    Rectal cancer Neg Hx    Stomach cancer Neg Hx    Colon polyps Neg Hx     Social History   Socioeconomic History   Marital status: Widowed    Spouse name: Not on file   Number of children: 2   Years of education: Not on file   Highest education level: Not on file  Occupational History   Not on file  Tobacco Use   Smoking status: Former    Packs/day: 0.50    Years: 35.00    Pack years: 17.50    Types: Cigarettes    Quit date: 02/10/1993    Years since quitting: 27.6   Smokeless tobacco: Former    Types: Chew    Quit date: 02/10/2006  Vaping Use   Vaping Use: Never used  Substance and Sexual Activity   Alcohol use: Not Currently    Alcohol/week: 1.0 standard drink    Types: 1 Cans of beer per week    Comment: occ   Drug use: No   Sexual activity: Yes    Partners: Female  Other Topics Concern   Not on file  Social History Narrative   Not on file   Social  Determinants of Health   Financial Resource Strain: Not on file  Food Insecurity: Not on file  Transportation Needs: Not on file  Physical Activity: Not on file  Stress: Not on file  Social Connections: Not on file  Intimate Partner Violence: Not on file    Outpatient Medications Prior to Visit  Medication Sig Dispense Refill   aspirin EC 81 MG tablet Take 1 tablet (81 mg total) by mouth daily. 30 tablet 11   atorvastatin (LIPITOR) 40 MG tablet Take 40 mg by mouth daily.     carboxymethylcellulose (REFRESH PLUS) 0.5 % SOLN Place 1 drop into both eyes 3 (three) times daily as needed for dry eyes (dry/irritated eyes).     cholecalciferol (VITAMIN D)  1000 UNITS tablet Take 1,000 Units by mouth in the morning.     cloNIDine (CATAPRES) 0.1 MG tablet Take 0.1 mg by mouth as needed (Take when sbp 170).     gabapentin (NEURONTIN) 600 MG tablet Take 1 tablet (600 mg total) by mouth 3 (three) times daily. 270 tablet 1   HYDROcodone-acetaminophen (NORCO) 10-325 MG tablet Take 1 tablet by mouth every 4 (four) hours as needed for moderate pain ((score 4 to 6)). 30 tablet 0   metFORMIN (GLUCOPHAGE) 500 MG tablet Take 500 mg by mouth daily.     nitroGLYCERIN (NITROSTAT) 0.4 MG SL tablet Place 1 tablet (0.4 mg total) under the tongue every 5 (five) minutes x 3 doses as needed for chest pain. 25 tablet 12   Omega-3 Fatty Acids (FISH OIL) 1200 MG CAPS Take 1,200 mg by mouth in the morning.     pantoprazole (PROTONIX) 40 MG tablet Take 1 tablet (40 mg total) by mouth daily. 90 tablet 1   tamsulosin (FLOMAX) 0.4 MG CAPS capsule Take 0.4 mg by mouth daily after supper.   1   lisinopril (ZESTRIL) 40 MG tablet Take 1 tablet (40 mg total) by mouth daily. (Patient taking differently: Take 20 mg by mouth daily.) 90 tablet 0   No facility-administered medications prior to visit.    Allergies  Allergen Reactions   Oxycontin [Oxycodone] Other (See Comments)    loopy    Review of Systems  Constitutional:   Negative for chills and fever.  HENT:  Negative for congestion, rhinorrhea and sore throat.   Respiratory:  Positive for cough (improving.) and shortness of breath (improving.).   Cardiovascular:  Negative for chest pain and palpitations.  Gastrointestinal:  Negative for abdominal pain, constipation, diarrhea, nausea and vomiting.  Genitourinary:  Negative for dysuria and urgency.  Musculoskeletal:  Positive for back pain. Negative for arthralgias and myalgias.  Neurological:  Positive for weakness, light-headedness and headaches.  Psychiatric/Behavioral:  Negative for dysphoric mood. The patient is not nervous/anxious.       Objective:    Physical Exam Vitals reviewed.  Constitutional:      Appearance: Normal appearance.  Cardiovascular:     Rate and Rhythm: Normal rate and regular rhythm.     Heart sounds: Normal heart sounds.  Pulmonary:     Effort: Pulmonary effort is normal.     Breath sounds: Normal breath sounds. No wheezing, rhonchi or rales.  Neurological:     Mental Status: He is alert and oriented to person, place, and time.  Psychiatric:        Mood and Affect: Mood normal.        Behavior: Behavior normal.    BP 124/72   Pulse 72   Temp (!) 97.5 F (36.4 C)   Resp 16   Ht _0  (1.803 m)   Wt 195 lb (88.5 kg)   BMI 27.20 kg/m  Wt Readings from Last 3 Encounters:  09/24/20 195 lb (88.5 kg)  08/23/20 194 lb (88 kg)  07/11/20 193 lb 9.6 oz (87.8 kg)    Health Maintenance Due  Topic Date Due   OPHTHALMOLOGY EXAM  Never done   TETANUS/TDAP  Never done   Zoster Vaccines- Shingrix (1 of 2) Never done   PNA vac Low Risk Adult (1 of 2 - PCV13) Never done   INFLUENZA VACCINE  09/10/2020    There are no preventive care reminders to display for this patient.   Lab Results  Component Value Date  TSH 3.000 08/31/2019   Lab Results  Component Value Date   WBC 5.7 07/11/2020   HGB 14.4 07/11/2020   HCT 43.2 07/11/2020   MCV 92 07/11/2020   PLT 204  07/11/2020   Lab Results  Component Value Date   NA 146 (H) 07/11/2020   K 4.3 07/11/2020   CO2 23 07/11/2020   GLUCOSE 102 (H) 07/11/2020   BUN 9 07/11/2020   CREATININE 0.76 07/11/2020   BILITOT 1.0 07/11/2020   ALKPHOS 119 07/11/2020   AST 17 07/11/2020   ALT 19 07/11/2020   PROT 6.5 07/11/2020   ALBUMIN 4.4 07/11/2020   CALCIUM 9.5 07/11/2020   ANIONGAP 10 05/29/2020   EGFR 90 07/11/2020   Lab Results  Component Value Date   CHOL 102 07/11/2020   Lab Results  Component Value Date   HDL 44 07/11/2020   Lab Results  Component Value Date   LDLCALC 38 07/11/2020   Lab Results  Component Value Date   TRIG 110 07/11/2020   Lab Results  Component Value Date   CHOLHDL 2.3 07/11/2020   Lab Results  Component Value Date   HGBA1C 6.2 (H) 05/29/2020       Assessment & Plan:  1. Hypertensive heart disease with chronic diastolic congestive heart failure (HCC)  Bp dropping too low.  Decrease lisinopril to 10 mg once daily.  SBP goal is 120-140.  Pt symptomatic when bp drops lower than this.   Meds ordered this encounter  Medications   lisinopril (ZESTRIL) 10 MG tablet    Sig: Take 1 tablet (10 mg total) by mouth daily.    Dispense:  90 tablet    Refill:  0   Follow-up: No follow-ups on file.  An After Visit Summary was printed and given to the patient.  Rochel Brome, MD [facility] [phone]

## 2020-09-26 DIAGNOSIS — M2560 Stiffness of unspecified joint, not elsewhere classified: Secondary | ICD-10-CM | POA: Diagnosis not present

## 2020-09-26 DIAGNOSIS — R531 Weakness: Secondary | ICD-10-CM | POA: Diagnosis not present

## 2020-09-26 DIAGNOSIS — M545 Low back pain, unspecified: Secondary | ICD-10-CM | POA: Diagnosis not present

## 2020-09-27 DIAGNOSIS — Z6827 Body mass index (BMI) 27.0-27.9, adult: Secondary | ICD-10-CM | POA: Diagnosis not present

## 2020-09-27 DIAGNOSIS — I10 Essential (primary) hypertension: Secondary | ICD-10-CM | POA: Diagnosis not present

## 2020-09-27 DIAGNOSIS — Z981 Arthrodesis status: Secondary | ICD-10-CM | POA: Diagnosis not present

## 2020-09-28 DIAGNOSIS — M2560 Stiffness of unspecified joint, not elsewhere classified: Secondary | ICD-10-CM | POA: Diagnosis not present

## 2020-09-28 DIAGNOSIS — R531 Weakness: Secondary | ICD-10-CM | POA: Diagnosis not present

## 2020-09-28 DIAGNOSIS — M545 Low back pain, unspecified: Secondary | ICD-10-CM | POA: Diagnosis not present

## 2020-10-01 DIAGNOSIS — M545 Low back pain, unspecified: Secondary | ICD-10-CM | POA: Diagnosis not present

## 2020-10-01 DIAGNOSIS — M2560 Stiffness of unspecified joint, not elsewhere classified: Secondary | ICD-10-CM | POA: Diagnosis not present

## 2020-10-01 DIAGNOSIS — R531 Weakness: Secondary | ICD-10-CM | POA: Diagnosis not present

## 2020-10-03 DIAGNOSIS — M2560 Stiffness of unspecified joint, not elsewhere classified: Secondary | ICD-10-CM | POA: Diagnosis not present

## 2020-10-03 DIAGNOSIS — R531 Weakness: Secondary | ICD-10-CM | POA: Diagnosis not present

## 2020-10-03 DIAGNOSIS — M545 Low back pain, unspecified: Secondary | ICD-10-CM | POA: Diagnosis not present

## 2020-10-08 DIAGNOSIS — M545 Low back pain, unspecified: Secondary | ICD-10-CM | POA: Diagnosis not present

## 2020-10-08 DIAGNOSIS — R531 Weakness: Secondary | ICD-10-CM | POA: Diagnosis not present

## 2020-10-08 DIAGNOSIS — M2560 Stiffness of unspecified joint, not elsewhere classified: Secondary | ICD-10-CM | POA: Diagnosis not present

## 2020-10-10 DIAGNOSIS — M2560 Stiffness of unspecified joint, not elsewhere classified: Secondary | ICD-10-CM | POA: Diagnosis not present

## 2020-10-10 DIAGNOSIS — M545 Low back pain, unspecified: Secondary | ICD-10-CM | POA: Diagnosis not present

## 2020-10-10 DIAGNOSIS — R531 Weakness: Secondary | ICD-10-CM | POA: Diagnosis not present

## 2020-10-11 DIAGNOSIS — E785 Hyperlipidemia, unspecified: Secondary | ICD-10-CM | POA: Diagnosis not present

## 2020-10-11 DIAGNOSIS — I10 Essential (primary) hypertension: Secondary | ICD-10-CM | POA: Diagnosis not present

## 2020-10-11 DIAGNOSIS — G8929 Other chronic pain: Secondary | ICD-10-CM | POA: Diagnosis not present

## 2020-10-11 DIAGNOSIS — E1159 Type 2 diabetes mellitus with other circulatory complications: Secondary | ICD-10-CM | POA: Diagnosis not present

## 2020-10-11 DIAGNOSIS — I700 Atherosclerosis of aorta: Secondary | ICD-10-CM | POA: Diagnosis not present

## 2020-10-11 DIAGNOSIS — I471 Supraventricular tachycardia: Secondary | ICD-10-CM | POA: Diagnosis not present

## 2020-10-11 DIAGNOSIS — D6869 Other thrombophilia: Secondary | ICD-10-CM | POA: Diagnosis not present

## 2020-10-11 DIAGNOSIS — I25119 Atherosclerotic heart disease of native coronary artery with unspecified angina pectoris: Secondary | ICD-10-CM | POA: Diagnosis not present

## 2020-10-11 DIAGNOSIS — I4891 Unspecified atrial fibrillation: Secondary | ICD-10-CM | POA: Diagnosis not present

## 2020-10-12 ENCOUNTER — Telehealth: Payer: Self-pay

## 2020-10-12 DIAGNOSIS — I10 Essential (primary) hypertension: Secondary | ICD-10-CM | POA: Diagnosis not present

## 2020-10-12 DIAGNOSIS — R0789 Other chest pain: Secondary | ICD-10-CM | POA: Diagnosis not present

## 2020-10-12 DIAGNOSIS — I2510 Atherosclerotic heart disease of native coronary artery without angina pectoris: Secondary | ICD-10-CM | POA: Diagnosis not present

## 2020-10-12 DIAGNOSIS — Z87891 Personal history of nicotine dependence: Secondary | ICD-10-CM | POA: Diagnosis not present

## 2020-10-12 DIAGNOSIS — I4891 Unspecified atrial fibrillation: Secondary | ICD-10-CM | POA: Diagnosis not present

## 2020-10-12 NOTE — Telephone Encounter (Signed)
   Patient ID: [name], male    DOB: [birth year]  Age: 80 y.o. MRN: [id number]  10/12/20 9:59 AM  Caller Name: Poseidon Schrauth Relationship to Patient: Self  Can be reached at: [phone]  Current Outpatient Medications on File Prior to Visit  Medication Sig Dispense Refill   aspirin EC 81 MG tablet Take 1 tablet (81 mg total) by mouth daily. 30 tablet 11   atorvastatin (LIPITOR) 40 MG tablet Take 40 mg by mouth daily.     carboxymethylcellulose (REFRESH PLUS) 0.5 % SOLN Place 1 drop into both eyes 3 (three) times daily as needed for dry eyes (dry/irritated eyes).     cholecalciferol (VITAMIN D) 1000 UNITS tablet Take 1,000 Units by mouth in the morning.     cloNIDine (CATAPRES) 0.1 MG tablet Take 0.1 mg by mouth as needed (Take when sbp 170).     gabapentin (NEURONTIN) 600 MG tablet Take 1 tablet (600 mg total) by mouth 3 (three) times daily. 270 tablet 1   HYDROcodone-acetaminophen (NORCO) 10-325 MG tablet Take 1 tablet by mouth every 4 (four) hours as needed for moderate pain ((score 4 to 6)). 30 tablet 0   lisinopril (ZESTRIL) 10 MG tablet Take 1 tablet (10 mg total) by mouth daily. 90 tablet 0   metFORMIN (GLUCOPHAGE) 500 MG tablet Take 500 mg by mouth daily.     nitroGLYCERIN (NITROSTAT) 0.4 MG SL tablet Place 1 tablet (0.4 mg total) under the tongue every 5 (five) minutes x 3 doses as needed for chest pain. 25 tablet 12   Omega-3 Fatty Acids (FISH OIL) 1200 MG CAPS Take 1,200 mg by mouth in the morning.     pantoprazole (PROTONIX) 40 MG tablet Take 1 tablet (40 mg total) by mouth daily. 90 tablet 1   tamsulosin (FLOMAX) 0.4 MG CAPS capsule Take 0.4 mg by mouth daily after supper.   1   No current facility-administered medications on file prior to visit.    [name] called today and was triaged by: Alonna Minium, in regard to:  Complaint: Increase in BP   Onset: Yesterday 9/1  BP log w/ P: 167/96 104             144/80  104 While CMA spoke w/ provider: 145/85   81  Description: Pt states beginning yesterday his BP started to rise. He has headache and felt nauseous. He did vomit this morning and feels like his heart is pounding. At the time pt denied chest pain/tightness, pain in L arm. He has been taking 20 mg lisinopril and 81 mg aspirin daily. Doctor beside CMA, pt stated he then began to have pressure in medial chest w/ possible radiation to L chest.    Advice Given: Dr Tobie Poet advised pt go to hospital. Pt VU and states his daughter will transport him to ED, he [facility] at [facility] within 10 minutes.   Royce Macadamia, [place] 10/12/20 10:09 AM

## 2020-10-17 ENCOUNTER — Inpatient Hospital Stay: Payer: Medicare PPO | Admitting: Nurse Practitioner

## 2020-10-17 ENCOUNTER — Other Ambulatory Visit: Payer: Self-pay

## 2020-10-17 ENCOUNTER — Encounter: Payer: Self-pay | Admitting: Nurse Practitioner

## 2020-10-17 ENCOUNTER — Ambulatory Visit (INDEPENDENT_AMBULATORY_CARE_PROVIDER_SITE_OTHER): Payer: Medicare PPO | Admitting: Nurse Practitioner

## 2020-10-17 VITALS — BP 158/78 | HR 57 | Temp 97.8°F | Ht 71.0 in | Wt 193.0 lb

## 2020-10-17 DIAGNOSIS — R0609 Other forms of dyspnea: Secondary | ICD-10-CM

## 2020-10-17 DIAGNOSIS — M26623 Arthralgia of bilateral temporomandibular joint: Secondary | ICD-10-CM | POA: Diagnosis not present

## 2020-10-17 DIAGNOSIS — R42 Dizziness and giddiness: Secondary | ICD-10-CM

## 2020-10-17 DIAGNOSIS — G4489 Other headache syndrome: Secondary | ICD-10-CM | POA: Diagnosis not present

## 2020-10-17 DIAGNOSIS — R519 Headache, unspecified: Secondary | ICD-10-CM

## 2020-10-17 DIAGNOSIS — R2689 Other abnormalities of gait and mobility: Secondary | ICD-10-CM

## 2020-10-17 DIAGNOSIS — G500 Trigeminal neuralgia: Secondary | ICD-10-CM | POA: Diagnosis not present

## 2020-10-17 DIAGNOSIS — I10 Essential (primary) hypertension: Secondary | ICD-10-CM | POA: Diagnosis not present

## 2020-10-17 DIAGNOSIS — R0600 Dyspnea, unspecified: Secondary | ICD-10-CM

## 2020-10-17 DIAGNOSIS — I4891 Unspecified atrial fibrillation: Secondary | ICD-10-CM | POA: Diagnosis not present

## 2020-10-17 DIAGNOSIS — I482 Chronic atrial fibrillation, unspecified: Secondary | ICD-10-CM

## 2020-10-17 DIAGNOSIS — R52 Pain, unspecified: Secondary | ICD-10-CM | POA: Diagnosis not present

## 2020-10-17 NOTE — Progress Notes (Addendum)
Established Patient Office Visit  Subjective:  Patient ID: [name], male    DOB: 11/04/1940  Age: 79 y.o. MRN: [id number]  CC:  Hospital follow-up uncontrolled hypertension  HPI [name] presents for ED follow-up of elevated BP. He is accompanied by his spouse who helps supplement health history.  Follow up ER visit  Patient was seen in ER for uncontrolled hypertension on 09/02. He was treated for elevated blood pressure. Treatment for this included Clonidine. He reports excellent compliance with treatment. He reports this condition is Worse.   Severe Right Temporal Headache Pt states he has been experiencing severe sharp stabbing pain to right temple for 4-days. Associated symptoms include nausea, impaired balance,pain/pressure behind right eye, and difficulty focusing when he has sharp right temple pain. Treatment has included Ibuprofen with minimal relief. Denies previous headache similar to the one he is experiencing now. Denies falling or head trauma. Neurological exam normal.  Past Medical History:  Diagnosis Date   Angina pectoris ([place]) 08/02/2019   Arthritis    on meds   Atrial fibrillation ([place])    Backache 08/17/2012   Body mass index (BMI) 26.0-26.9, adult 05/08/2020   Cancer ([place]) 10/2015   prostate cancer treated with radiation   Carpal tunnel syndrome 01/18/2018   Cataract    sx-bilaterally   Cervical spondylosis without myelopathy 09/24/2015   Chest tightness 06/28/2019   Chronic back pain 05/30/2013   Colitis 11/22/2018   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 06/17/2016   Deficiency of other specified B group vitamins 05/20/2019   Depression 04/04/2019   Diabetes mellitus due to underlying condition with unspecified complications ([place]) 5/44/9201   Diabetes mellitus without complication ([place])    Type II   Diarrhea 11/21/2018   Dyslipidemia 06/17/2016   Dyspnea on exertion 06/28/2019   Dysrhythmia    Essential  (primary) hypertension 06/17/2016   Essential hypertension 06/17/2016   GERD (gastroesophageal reflux disease)    on meds   Greater trochanteric pain syndrome 07/03/2020   Headache    History of hiatal hernia    History of kidney stones    History of prostate cancer 05/20/2019   Hypercholesteremia 04/04/2019   Hyperlipemia    Hypertension    Impaired fasting blood sugar 05/20/2019   Impingement syndrome of shoulder region 01/12/2018   Low back pain 02/20/2014   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   Lumbar radiculopathy 05/24/2019   Malignant neoplasm prostate ([place]) 05/20/2019   Medicare annual wellness visit, subsequent 01/16/2020   Mixed hyperlipidemia 08/31/2019   Neck pain 07/03/2015   Paresthesia of hand 01/12/2018   Paresthesia of upper limb 01/12/2018   Paroxysmal atrial fibrillation ([place]) 04/04/2019   PONV (postoperative nausea and vomiting)    S/P cervical spinal fusion 03/04/2018   S/P lumbar fusion 12/09/2019   S/P lumbar spinal fusion 09/29/2013   Sepsis ([place]) 11/22/2018   Shoulder pain 01/12/2018   Spinal 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 01/26/2018   Stenosis of intervertebral foramina 01/12/2018   Trochanteric bursitis of right hip 04/19/2020    Past Surgical History:  Procedure Laterality Date   anal fissures     ANTERIOR CERVICAL DECOMP/DISCECTOMY FUSION N/A 03/04/2018   Procedure: Anterior Cervical Decompression Fusion - Cervical three-Cervical four - Cervical four-Cervical five;  Surgeon: Eustace Moore, MD;  Location: [place];  Service: Neurosurgery;  Laterality: N/A;   ANTERIOR LAT LUMBAR FUSION N/A 05/30/2020   Procedure: Anterior Lateral Lumbar Fusion Lumbar One-Two with Lateral Plate Removal of pedicle  screws Lumbar One and Posterior Lateral Fusion Lumbar One-Lumbar Three;  Surgeon: Eustace Moore, MD;  Location: [place];  Service: Neurosurgery;  Laterality: N/A;   BACK SURGERY     CARDIAC CATHETERIZATION     no PCI   CARPAL TUNNEL RELEASE Left 03/04/2018   Procedure: Carpal Tunnel Release  - left;  Surgeon: Eustace Moore, MD;  Location: [place];  Service: Neurosurgery;  Laterality: Left;   CHOLECYSTECTOMY     COLONOSCOPY W/ POLYPECTOMY  08/30/2012   Colonic polyp status post polypectomy.  Pancolonic diverticulosis predominantly in the sigmoid colon. Small internal hemorrhoids. Moderate internal hemorhroids.    ESOPHAGOGASTRODUODENOSCOPY  08/04/2016   Schatzkis ring status post esophageal dilatation. Small hiatal hernia. Mild gastritis.    EYE SURGERY     cataract removal bilateral eyes   FOOT SURGERY     left heel   HARDWARE REMOVAL N/A 05/30/2020   Procedure: HARDWARE REMOVAL;  Surgeon: Eustace Moore, MD;  Location: [place];  Service: Neurosurgery;  Laterality: N/A;   HERNIA REPAIR     LAMINECTOMY WITH POSTERIOR LATERAL ARTHRODESIS LEVEL 2 N/A 12/09/2019   Procedure: Laminectomy and Foraminotomy - Lumbar one-two, Lumbar two-three, posterolateral instrumented fusion Lumbar one-three, removal of instrumentation Lumbar three-five;  Surgeon: Eustace Moore, MD;  Location: [place];  Service: Neurosurgery;  Laterality: N/A;   LEFT HEART CATH AND CORONARY ANGIOGRAPHY N/A 08/03/2019   Procedure: LEFT HEART CATH AND CORONARY ANGIOGRAPHY;  Surgeon: Jettie Booze, MD;  Location: [facility];  Service: Cardiovascular;  Laterality: N/A;   SHOULDER SURGERY     bilateral   WISDOM TOOTH EXTRACTION      Family History  Problem Relation Age of Onset   Colon cancer Neg Hx    Esophageal cancer Neg Hx    Rectal cancer Neg Hx    Stomach cancer Neg Hx    Colon polyps Neg Hx     Social History   Socioeconomic History   Marital status: Widowed    Spouse name: Not on file   Number of children: 2   Years of education: Not on file   Highest education level: Not on file  Occupational History   Not on file  Tobacco Use   Smoking status: Former    Packs/day: 0.50    Years: 35.00    Pack years: 17.50    Types: Cigarettes    Quit date: 02/10/1993    Years since quitting: 27.7    Smokeless tobacco: Former    Types: Chew    Quit date: 02/10/2006  Vaping Use   Vaping Use: Never used  Substance and Sexual Activity   Alcohol use: Not Currently    Alcohol/week: 1.0 standard drink    Types: 1 Cans of beer per week    Comment: occ   Drug use: No   Sexual activity: Yes    Partners: Female  Other Topics Concern   Not on file  Social History Narrative   Not on file   Social Determinants of Health   Financial Resource Strain: Not on file  Food Insecurity: Not on file  Transportation Needs: Not on file  Physical Activity: Not on file  Stress: Not on file  Social Connections: Not on file  Intimate Partner Violence: Not on file    Outpatient Medications Prior to Visit  Medication Sig Dispense Refill   aspirin EC 81 MG tablet Take 1 tablet (81 mg total) by mouth daily. 30 tablet 11   atorvastatin (  LIPITOR) 40 MG tablet Take 40 mg by mouth daily.     carboxymethylcellulose (REFRESH PLUS) 0.5 % SOLN Place 1 drop into both eyes 3 (three) times daily as needed for dry eyes (dry/irritated eyes).     cholecalciferol (VITAMIN D) 1000 UNITS tablet Take 1,000 Units by mouth in the morning.     cloNIDine (CATAPRES) 0.1 MG tablet Take 0.1 mg by mouth as needed (Take when sbp 170).     gabapentin (NEURONTIN) 600 MG tablet Take 1 tablet (600 mg total) by mouth 3 (three) times daily. 270 tablet 1   HYDROcodone-acetaminophen (NORCO) 10-325 MG tablet Take 1 tablet by mouth every 4 (four) hours as needed for moderate pain ((score 4 to 6)). 30 tablet 0   lisinopril (ZESTRIL) 10 MG tablet Take 1 tablet (10 mg total) by mouth daily. 90 tablet 0   metFORMIN (GLUCOPHAGE) 500 MG tablet Take 500 mg by mouth daily.     nitroGLYCERIN (NITROSTAT) 0.4 MG SL tablet Place 1 tablet (0.4 mg total) under the tongue every 5 (five) minutes x 3 doses as needed for chest pain. 25 tablet 12   Omega-3 Fatty Acids (FISH OIL) 1200 MG CAPS Take 1,200 mg by mouth in the morning.     pantoprazole (PROTONIX) 40  MG tablet Take 1 tablet (40 mg total) by mouth daily. 90 tablet 1   tamsulosin (FLOMAX) 0.4 MG CAPS capsule Take 0.4 mg by mouth daily after supper.   1   No facility-administered medications prior to visit.    Allergies  Allergen Reactions   Oxycontin [Oxycodone] Other (See Comments)    loopy    ROS Review of Systems  Constitutional:  Negative for appetite change, fatigue and unexpected weight change.  HENT:  Negative for congestion, ear pain, rhinorrhea, sinus pressure, sinus pain and tinnitus.   Eyes:  Positive for pain (behind right eye) and visual disturbance (difficulty focusing during headache).  Respiratory:  Positive for shortness of breath. Negative for cough.   Cardiovascular:  Positive for palpitations. Negative for chest pain and leg swelling.  Gastrointestinal:  Positive for nausea. Negative for abdominal pain, constipation, diarrhea and vomiting.  Endocrine: Negative for cold intolerance, heat intolerance, polydipsia, polyphagia and polyuria.  Genitourinary:  Negative for dysuria, frequency and hematuria.  Musculoskeletal:  Negative for arthralgias, back pain, joint swelling and myalgias.  Skin:  Negative for rash.  Allergic/Immunologic: Negative for environmental allergies.  Neurological:  Positive for dizziness, weakness, light-headedness and headaches.  Hematological:  Negative for adenopathy.  Psychiatric/Behavioral:  Negative for decreased concentration and sleep disturbance. The patient is not nervous/anxious.      Objective:    Physical Exam Vitals reviewed.  Constitutional:      Appearance: Normal appearance.  HENT:     Head: Normocephalic.     Right Ear: Tympanic membrane normal.     Left Ear: Tympanic membrane normal.     Mouth/Throat:     Mouth: Mucous membranes are moist.  Cardiovascular:     Rate and Rhythm: Normal rate. Rhythm irregular.     Pulses: Normal pulses.     Heart sounds: Normal heart sounds.  Pulmonary:     Effort: Pulmonary  effort is normal.     Breath sounds: Normal breath sounds.  Abdominal:     General: Bowel sounds are normal.     Palpations: Abdomen is soft.  Musculoskeletal:        General: Normal range of motion.  Skin:    General: Skin is warm and  dry.     Capillary Refill: Capillary refill takes less than 2 seconds.  Neurological:     General: No focal deficit present.     Mental Status: He is alert and oriented to person, place, and time.  Psychiatric:        Mood and Affect: Mood normal.        Behavior: Behavior normal.   BP (!) 158/78 (BP Location: Left Arm, Patient Position: Sitting)   Pulse (!) 57   Temp 97.8 F (36.6 C) (Temporal)   Ht 5' 11"  (1.803 m)   Wt 193 lb (87.5 kg)   SpO2 99%   BMI 26.92 kg/m    Wt Readings from Last 3 Encounters:  09/24/20 195 lb (88.5 kg)  08/23/20 194 lb (88 kg)  07/11/20 193 lb 9.6 oz (87.8 kg)     Health Maintenance Due  Topic Date Due   OPHTHALMOLOGY EXAM  Never done   TETANUS/TDAP  Never done   Zoster Vaccines- Shingrix (1 of 2) Never done   PNA vac Low Risk Adult (1 of 2 - PCV13) Never done   INFLUENZA VACCINE  09/10/2020   COVID-19 Vaccine (4 - Booster for Moderna series) 10/11/2020      Lab Results  Component Value Date   TSH 3.000 08/31/2019   Lab Results  Component Value Date   WBC 5.7 07/11/2020   HGB 14.4 07/11/2020   HCT 43.2 07/11/2020   MCV 92 07/11/2020   PLT 204 07/11/2020   Lab Results  Component Value Date   NA 146 (H) 07/11/2020   K 4.3 07/11/2020   CO2 23 07/11/2020   GLUCOSE 102 (H) 07/11/2020   BUN 9 07/11/2020   CREATININE 0.76 07/11/2020   BILITOT 1.0 07/11/2020   ALKPHOS 119 07/11/2020   AST 17 07/11/2020   ALT 19 07/11/2020   PROT 6.5 07/11/2020   ALBUMIN 4.4 07/11/2020   CALCIUM 9.5 07/11/2020   ANIONGAP 10 05/29/2020   EGFR 90 07/11/2020   Lab Results  Component Value Date   CHOL 102 07/11/2020   Lab Results  Component Value Date   HDL 44 07/11/2020   Lab Results  Component Value  Date   LDLCALC 38 07/11/2020   Lab Results  Component Value Date   TRIG 110 07/11/2020   Lab Results  Component Value Date   CHOLHDL 2.3 07/11/2020   Lab Results  Component Value Date   HGBA1C 6.2 (H) 05/29/2020      Assessment & Plan:   1. Right sided temporal headache -high flow O2 at 5L/min via [place] applied in office -EMS called to transport pt to ED at [facility] per pt preference -Possible differential diagnosis right temporal arteritis vs cluster headache  2. Balance problems  3. Uncontrolled hypertension - EKG 12-Lead  4. Chronic a-fib (HCC) - EKG 12-Lead  5. Dizziness  6. Dyspnea on exertion   Pt transported via EMS with portable O2 and cardiac monitor to [facility].   Follow-up: Pending ED physician evaluation  Signed, Rip Harbour, NP

## 2020-10-18 ENCOUNTER — Ambulatory Visit (INDEPENDENT_AMBULATORY_CARE_PROVIDER_SITE_OTHER): Payer: Medicare PPO | Admitting: Nurse Practitioner

## 2020-10-18 ENCOUNTER — Inpatient Hospital Stay: Payer: Medicare PPO | Admitting: Nurse Practitioner

## 2020-10-18 ENCOUNTER — Encounter: Payer: Self-pay | Admitting: Nurse Practitioner

## 2020-10-18 VITALS — BP 140/72 | HR 68 | Temp 97.4°F | Ht 71.0 in | Wt 194.0 lb

## 2020-10-18 DIAGNOSIS — R110 Nausea: Secondary | ICD-10-CM

## 2020-10-18 DIAGNOSIS — R2689 Other abnormalities of gait and mobility: Secondary | ICD-10-CM | POA: Diagnosis not present

## 2020-10-18 DIAGNOSIS — G500 Trigeminal neuralgia: Secondary | ICD-10-CM | POA: Diagnosis not present

## 2020-10-18 DIAGNOSIS — R519 Headache, unspecified: Secondary | ICD-10-CM

## 2020-10-18 MED ORDER — ONDANSETRON 4 MG PO TBDP: 4.0000 mg | ORAL_TABLET | Freq: Three times a day (TID) | ORAL | 0 refills | Status: DC | PRN

## 2020-10-18 MED ORDER — PREDNISONE 20 MG PO TABS: 20.0000 mg | ORAL_TABLET | Freq: Every day | ORAL | 0 refills | Status: DC

## 2020-10-18 MED ORDER — KETOROLAC TROMETHAMINE 60 MG/2ML IM SOLN
60.0000 mg | Freq: Once | INTRAMUSCULAR | Status: AC
  Administered 2020-10-18: 60 mg via INTRAMUSCULAR

## 2020-10-18 NOTE — Progress Notes (Signed)
Subjective:  Patient ID: [name], male    DOB: 05/30/1940  Age: 80 y.o. MRN: [id number]  Chief Complaint  Patient presents with   Hospital follow up    HPI  EL is an 80 year old Caucasian male that presents for hospital follow-up. He is accompanied by his significant other and adult daughter. He has been experiencing severe right temporal headache for 5-days. Treatment has included Tylenol. He was seen at [facility] yesterday. He was diagnosed with trigeminal neuralgia and prescribed Tegretol and Norvasc 5 mg. States his symptoms have worsened rather than getting better.  Follow up ER visit  Patient was seen in ER for right temporal headache on 10/17/20. He was treated for trigeminal neuralgia. Treatment for this included Tegretol and Norvasc . He reports good compliance with treatment. He reports this condition is Worse.  Current Outpatient Medications on File Prior to Visit  Medication Sig Dispense Refill   amLODipine (NORVASC) 5 MG tablet Take 5 mg by mouth daily.     aspirin EC 81 MG tablet Take 1 tablet (81 mg total) by mouth daily. 30 tablet 11   atorvastatin (LIPITOR) 40 MG tablet Take 40 mg by mouth daily.     CARBAMAZEPINE PO Take 100 mg by mouth in the morning and at bedtime.     carboxymethylcellulose (REFRESH PLUS) 0.5 % SOLN Place 1 drop into both eyes 3 (three) times daily as needed for dry eyes (dry/irritated eyes).     cholecalciferol (VITAMIN D) 1000 UNITS tablet Take 1,000 Units by mouth in the morning.     cloNIDine (CATAPRES) 0.1 MG tablet Take 0.1 mg by mouth as needed (Take when sbp 170).     gabapentin (NEURONTIN) 600 MG tablet Take 1 tablet (600 mg total) by mouth 3 (three) times daily. 270 tablet 1   HYDROcodone-acetaminophen (NORCO/VICODIN) 5-325 MG tablet Take 1 tablet by mouth every 6 (six) hours as needed.     lisinopril (ZESTRIL) 10 MG tablet Take 1 tablet (10 mg total) by mouth daily. 90 tablet 0   metFORMIN (GLUCOPHAGE) 500 MG  tablet Take 500 mg by mouth daily.     nitroGLYCERIN (NITROSTAT) 0.4 MG SL tablet Place 1 tablet (0.4 mg total) under the tongue every 5 (five) minutes x 3 doses as needed for chest pain. 25 tablet 12   Omega-3 Fatty Acids (FISH OIL) 1200 MG CAPS Take 1,200 mg by mouth in the morning.     pantoprazole (PROTONIX) 40 MG tablet Take 1 tablet (40 mg total) by mouth daily. 90 tablet 1   tamsulosin (FLOMAX) 0.4 MG CAPS capsule Take 0.4 mg by mouth daily after supper.   1   No current facility-administered medications on file prior to visit.   Past Medical History:  Diagnosis Date   Angina pectoris ([place]) 08/02/2019   Arthritis    on meds   Atrial fibrillation ([place])    Backache 08/17/2012   Body mass index (BMI) 26.0-26.9, adult 05/08/2020   Cancer ([place]) 10/2015   prostate cancer treated with radiation   Carpal tunnel syndrome 01/18/2018   Cataract    sx-bilaterally   Cervical spondylosis without myelopathy 09/24/2015   Chest tightness 06/28/2019   Chronic back pain 05/30/2013   Colitis 11/22/2018   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 06/17/2016   Deficiency of other specified B group vitamins 05/20/2019   Depression 04/04/2019   Diabetes mellitus due to underlying condition with unspecified complications ([place]) 0000000   Diabetes mellitus  without complication ([place])    Type II   Diarrhea 11/21/2018   Dyslipidemia 06/17/2016   Dyspnea on exertion 06/28/2019   Dysrhythmia    Essential (primary) hypertension 06/17/2016   Essential hypertension 06/17/2016   GERD (gastroesophageal reflux disease)    on meds   Greater trochanteric pain syndrome 07/03/2020   Headache    History of hiatal hernia    History of kidney stones    History of prostate cancer 05/20/2019   Hypercholesteremia 04/04/2019   Hyperlipemia    Hypertension    Impaired fasting blood sugar 05/20/2019   Impingement syndrome of shoulder region 01/12/2018   Low back pain 02/20/2014   Lumbar  pseudoarthrosis 09/06/2013   Lumbar radiculopathy 05/24/2019   Malignant neoplasm prostate ([place]) 05/20/2019   Medicare annual wellness visit, subsequent 01/16/2020   Mixed hyperlipidemia 08/31/2019   Neck pain 07/03/2015   Paresthesia of hand 01/12/2018   Paresthesia of upper limb 01/12/2018   Paroxysmal atrial fibrillation ([place]) 04/04/2019   PONV (postoperative nausea and vomiting)    S/P cervical spinal fusion 03/04/2018   S/P lumbar fusion 12/09/2019   S/P lumbar spinal fusion 09/29/2013   Sepsis ([place]) 11/22/2018   Shoulder pain 01/12/2018   Spinal 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 01/26/2018   Stenosis of intervertebral foramina 01/12/2018   Trochanteric bursitis of right hip 04/19/2020   Past Surgical History:  Procedure Laterality Date   anal fissures     ANTERIOR CERVICAL DECOMP/DISCECTOMY FUSION N/A 03/04/2018   Procedure: Anterior Cervical Decompression Fusion - Cervical three-Cervical four - Cervical four-Cervical five;  Surgeon: Eustace Moore, MD;  Location: [place];  Service: Neurosurgery;  Laterality: N/A;   ANTERIOR LAT LUMBAR FUSION N/A 05/30/2020   Procedure: Anterior Lateral Lumbar Fusion Lumbar One-Two with Lateral Plate Removal of pedicle screws Lumbar One and Posterior Lateral Fusion Lumbar One-Lumbar Three;  Surgeon: Eustace Moore, MD;  Location: [place];  Service: Neurosurgery;  Laterality: N/A;   BACK SURGERY     CARDIAC CATHETERIZATION     no PCI   CARPAL TUNNEL RELEASE Left 03/04/2018   Procedure: Carpal Tunnel Release - left;  Surgeon: Eustace Moore, MD;  Location: [place];  Service: Neurosurgery;  Laterality: Left;   CHOLECYSTECTOMY     COLONOSCOPY W/ POLYPECTOMY  08/30/2012   Colonic polyp status post polypectomy.  Pancolonic diverticulosis predominantly in the sigmoid colon. Small internal hemorrhoids. Moderate internal hemorhroids.    ESOPHAGOGASTRODUODENOSCOPY  08/04/2016   Schatzkis ring status post esophageal dilatation. Small hiatal hernia. Mild gastritis.    EYE SURGERY      cataract removal bilateral eyes   FOOT SURGERY     left heel   HARDWARE REMOVAL N/A 05/30/2020   Procedure: HARDWARE REMOVAL;  Surgeon: Eustace Moore, MD;  Location: [place];  Service: Neurosurgery;  Laterality: N/A;   HERNIA REPAIR     LAMINECTOMY WITH POSTERIOR LATERAL ARTHRODESIS LEVEL 2 N/A 12/09/2019   Procedure: Laminectomy and Foraminotomy - Lumbar one-two, Lumbar two-three, posterolateral instrumented fusion Lumbar one-three, removal of instrumentation Lumbar three-five;  Surgeon: Eustace Moore, MD;  Location: [place];  Service: Neurosurgery;  Laterality: N/A;   LEFT HEART CATH AND CORONARY ANGIOGRAPHY N/A 08/03/2019   Procedure: LEFT HEART CATH AND CORONARY ANGIOGRAPHY;  Surgeon: Jettie Booze, MD;  Location: [facility];  Service: Cardiovascular;  Laterality: N/A;   SHOULDER SURGERY     bilateral   WISDOM TOOTH EXTRACTION      Family History  Problem Relation Age of  Onset   Colon cancer Neg Hx    Esophageal cancer Neg Hx    Rectal cancer Neg Hx    Stomach cancer Neg Hx    Colon polyps Neg Hx    Social History   Socioeconomic History   Marital status: Widowed    Spouse name: Not on file   Number of children: 2   Years of education: Not on file   Highest education level: Not on file  Occupational History   Not on file  Tobacco Use   Smoking status: Former    Packs/day: 0.50    Years: 35.00    Pack years: 17.50    Types: Cigarettes    Quit date: 02/10/1993    Years since quitting: 27.7   Smokeless tobacco: Former    Types: Chew    Quit date: 02/10/2006  Vaping Use   Vaping Use: Never used  Substance and Sexual Activity   Alcohol use: Not Currently    Alcohol/week: 1.0 standard drink    Types: 1 Cans of beer per week    Comment: occ   Drug use: No   Sexual activity: Yes    Partners: Female  Other Topics Concern   Not on file  Social History Narrative   Not on file   Social Determinants of Health   Financial Resource Strain: Not on file  Food  Insecurity: Not on file  Transportation Needs: Not on file  Physical Activity: Not on file  Stress: Not on file  Social Connections: Not on file    Review of Systems  Constitutional:  Positive for fatigue. Negative for appetite change and fever.  HENT:  Negative for congestion, ear pain and sore throat.   Eyes: Negative.   Respiratory:  Negative for cough and shortness of breath.   Cardiovascular:  Negative for chest pain and leg swelling.  Gastrointestinal:  Positive for nausea. Negative for abdominal pain, constipation, diarrhea and vomiting.  Endocrine: Negative.   Genitourinary:  Negative for dysuria and frequency.  Musculoskeletal:  Negative for arthralgias and myalgias.  Skin: Negative.   Allergic/Immunologic: Negative.   Neurological:  Positive for dizziness, weakness and headaches (Right temporal area).  Hematological: Negative.   Psychiatric/Behavioral:  Positive for confusion (family states pt has intermittent confusion and forgetfulness), decreased concentration and sleep disturbance (related to severe headache). Negative for dysphoric mood. The patient is not nervous/anxious.     Objective:  BP 140/72 (BP Location: Left Arm, Patient Position: Sitting)   Pulse 68   Temp (!) 97.4 F (36.3 C) (Temporal)   Ht '5\' 11"'$  (1.803 m)   Wt 194 lb (88 kg)   SpO2 98%   BMI 27.06 kg/m   BP/Weight 10/18/2020 10/17/2020 [id number]  Systolic BP [id number]  Diastolic BP 72 78 72  Wt. (Lbs) 194 193 195  BMI 27.06 26.92 27.2    Physical Exam Vitals reviewed.  Constitutional:      Appearance: He is ill-appearing.  HENT:     Nose: Nose normal.     Mouth/Throat:     Mouth: Mucous membranes are moist.  Eyes:     Pupils: Pupils are equal, round, and reactive to light.  Cardiovascular:     Rate and Rhythm: Normal rate. Rhythm irregular.     Pulses: Normal pulses.     Heart sounds: Normal heart sounds.  Pulmonary:     Breath sounds: Normal breath sounds.  Abdominal:     General:  Bowel sounds are normal.  Palpations: Abdomen is soft.  Musculoskeletal:        General: Normal range of motion.     Cervical back: Normal range of motion.  Skin:    General: Skin is warm and dry.     Capillary Refill: Capillary refill takes less than 2 seconds.  Neurological:     General: No focal deficit present.     Mental Status: He is alert and oriented to person, place, and time.     Gait: Gait abnormal.  Psychiatric:        Mood and Affect: Mood normal.        Behavior: Behavior normal.     Lab Results  Component Value Date   WBC 5.7 07/11/2020   HGB 14.4 07/11/2020   HCT 43.2 07/11/2020   PLT 204 07/11/2020   GLUCOSE 102 (H) 07/11/2020   CHOL 102 07/11/2020   TRIG 110 07/11/2020   HDL 44 07/11/2020   LDLCALC 38 07/11/2020   ALT 19 07/11/2020   AST 17 07/11/2020   NA 146 (H) 07/11/2020   K 4.3 07/11/2020   CL 106 07/11/2020   CREATININE 0.76 07/11/2020   BUN 9 07/11/2020   CO2 23 07/11/2020   TSH 3.000 08/31/2019   INR 0.9 05/29/2020   HGBA1C 6.2 (H) 05/29/2020   MICROALBUR 30 07/11/2020      Assessment & Plan:     1. Right sided temporal headache - MR Brain W Wo Contrast - ketorolac (TORADOL) injection 60 mg - Ambulatory referral to Neurology - predniSONE (DELTASONE) 20 MG tablet; Take 1 tablet (20 mg total) by mouth daily with breakfast. 1 po tid for 3 days then 1 po bid for 3 days then 1 po qd for 3 days  Dispense: 18 tablet; Refill: 0  2. Nausea - MR Brain W Wo Contrast - ondansetron (ZOFRAN ODT) 4 MG disintegrating tablet; Take 1 tablet (4 mg total) by mouth every 8 (eight) hours as needed for nausea or vomiting.  Dispense: 20 tablet; Refill: 0 - Ambulatory referral to Neurology  3. Balance problem - MR Brain W Wo Contrast - Ambulatory referral to Neurology  4. Trigeminal neuralgia of right side of face - Ambulatory referral to Neurology    Take steroids as directed Take Zofran '4mg'$  for nausea as needed We will call you with MRI and  neurologist appointment Seek emergency medical care for concerning symptoms  Follow-up: PRN, pending imaging studies  An After Visit Summary was printed and given to the patient.  I,Lauren M Auman,acting as a Education administrator for [employer], NP.,have documented all relevant documentation on the behalf of Rip Harbour, NP,as directed by  Rip Harbour, NP while in the presence of Rip Harbour, NP.   I, Rip Harbour, NP, have reviewed all documentation for this visit. The documentation on 10/18/20 for the exam, diagnosis, procedures, and orders are all accurate and complete.   Rip Harbour, NP [place] [phone]

## 2020-10-18 NOTE — Progress Notes (Deleted)
Established Patient Office Visit  Subjective:  Patient ID: [name], male    DOB: 1940-12-20  Age: 80 y.o. MRN: [id number]  CC:  Chief Complaint  Patient presents with  . Hospital follow up    HPI [place] L Weare presents for right temporal headache, dizziness, and impaired balance. He was sent from office to [facility] via ambulance on 10/17/20. States he was discharged from ED and recommended to follow-up with PCP.   Follow up ER visit  Patient was seen in ER for right temporal headache on 10/17/20. He was treated for trigeminal neuralgia. Treatment for this included Tegretol and Norvasc. He reports good compliance with treatment. He reports this condition is Worse.       Past Medical History:  Diagnosis Date  . Angina pectoris ([place]) 08/02/2019  . Arthritis    on meds  . Atrial fibrillation ([place])   . Backache 08/17/2012  . Body mass index (BMI) 26.0-26.9, adult 05/08/2020  . Cancer ([place]) 10/2015   prostate cancer treated with radiation  . Carpal tunnel syndrome 01/18/2018  . Cataract    sx-bilaterally  . Cervical spondylosis without myelopathy 09/24/2015  . Chest tightness 06/28/2019  . Chronic back pain 05/30/2013  . Colitis 11/22/2018  . 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 Depression 04/04/2019  . Diabetes mellitus due to underlying condition with unspecified complications ([place]) 1/60/7371  . Diabetes mellitus without complication ([place])    Type II  . Diarrhea 11/21/2018  . Dyslipidemia 06/17/2016  . Dyspnea on exertion 06/28/2019  . Dysrhythmia   . Essential (primary) hypertension 06/17/2016  . Essential hypertension 06/17/2016  . GERD (gastroesophageal reflux disease)    on meds  . Greater trochanteric pain syndrome 07/03/2020  . Headache   . History of hiatal hernia   . History of kidney stones   . History of prostate cancer  05/20/2019  . Hypercholesteremia 04/04/2019  . Hyperlipemia   . Hypertension   . Impaired fasting blood sugar 05/20/2019  . Impingement syndrome of shoulder region 01/12/2018  . Low back pain 02/20/2014  . 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  . Lumbar radiculopathy 05/24/2019  . Malignant neoplasm prostate ([place]) 05/20/2019  . Medicare annual wellness visit, subsequent 01/16/2020  . Mixed hyperlipidemia 08/31/2019  . Neck pain 07/03/2015  . Paresthesia of hand 01/12/2018  . Paresthesia of upper limb 01/12/2018  . Paroxysmal atrial fibrillation ([place]) 04/04/2019  . PONV (postoperative nausea and vomiting)   . 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 S/P lumbar fusion 12/09/2019  . S/P lumbar spinal fusion 09/29/2013  . Sepsis ([place]) 11/22/2018  . Shoulder pain 01/12/2018  . Spinal 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 01/26/2018  . Stenosis of intervertebral foramina 01/12/2018  . Trochanteric bursitis of right hip 04/19/2020    Past Surgical History:  Procedure Laterality Date  . anal fissures    . ANTERIOR CERVICAL DECOMP/DISCECTOMY FUSION N/A 03/04/2018   Procedure: Anterior Cervical Decompression Fusion - Cervical three-Cervical four - Cervical four-Cervical five;  Surgeon: Eustace Moore, MD;  Location: [place];  Service: Neurosurgery;  Laterality: N/A;  . ANTERIOR LAT LUMBAR FUSION N/A 05/30/2020   Procedure: Anterior Lateral Lumbar Fusion Lumbar One-Two with Lateral Plate Removal of pedicle screws Lumbar One and Posterior Lateral Fusion Lumbar One-Lumbar Three;  Surgeon: Eustace Moore, MD;  Location: [place];  Service: Neurosurgery;  Laterality: N/A;  . BACK SURGERY    .  CARDIAC CATHETERIZATION     no PCI  . CARPAL TUNNEL RELEASE Left 03/04/2018   Procedure: Carpal Tunnel Release - left;  Surgeon: Eustace Moore, MD;  Location: [place];  Service: Neurosurgery;  Laterality: Left;  . CHOLECYSTECTOMY    . COLONOSCOPY W/ POLYPECTOMY  08/30/2012   Colonic polyp status post polypectomy.  Pancolonic diverticulosis  predominantly in the sigmoid colon. Small internal hemorrhoids. Moderate internal hemorhroids.   . ESOPHAGOGASTRODUODENOSCOPY  08/04/2016   Schatzkis ring status post esophageal dilatation. Small hiatal hernia. Mild gastritis.   Marland Kitchen EYE SURGERY     cataract removal bilateral eyes  . FOOT SURGERY     left heel  . HARDWARE REMOVAL N/A 05/30/2020   Procedure: HARDWARE REMOVAL;  Surgeon: Eustace Moore, MD;  Location: [place];  Service: Neurosurgery;  Laterality: N/A;  . HERNIA REPAIR    . LAMINECTOMY WITH POSTERIOR LATERAL ARTHRODESIS LEVEL 2 N/A 12/09/2019   Procedure: Laminectomy and Foraminotomy - Lumbar one-two, Lumbar two-three, posterolateral instrumented fusion Lumbar one-three, removal of instrumentation Lumbar three-five;  Surgeon: Eustace Moore, MD;  Location: [place];  Service: Neurosurgery;  Laterality: N/A;  . LEFT HEART CATH AND CORONARY ANGIOGRAPHY N/A 08/03/2019   Procedure: LEFT HEART CATH AND CORONARY ANGIOGRAPHY;  Surgeon: Jettie Booze, MD;  Location: [place] [facility];  Service: Cardiovascular;  Laterality: N/A;  . SHOULDER SURGERY     bilateral  . WISDOM TOOTH EXTRACTION      Family History  Problem Relation Age of Onset  . Colon cancer Neg Hx   . Esophageal cancer Neg Hx   . Rectal cancer Neg Hx   . Stomach cancer Neg Hx   . Colon polyps Neg Hx     Social History   Socioeconomic History  . Marital status: Widowed    Spouse name: Not on file  . Number of children: 2  . Years of education: Not on file  . Highest education level: Not on file  Occupational History  . Not on file  Tobacco Use  . Smoking status: Former    Packs/day: 0.50    Years: 35.00    Pack years: 17.50    Types: Cigarettes    Quit date: 02/10/1993    Years since quitting: 27.7  . Smokeless tobacco: Former    Types: Chew    Quit date: 02/10/2006  Vaping Use  . Vaping Use: Never used  Substance and Sexual Activity  . Alcohol use: Not Currently    Alcohol/week: 1.0 standard drink     Types: 1 Cans of beer per week    Comment: occ  . Drug use: No  . Sexual activity: Yes    Partners: Female  Other Topics Concern  . Not on file  Social History Narrative  . Not on file   Social Determinants of Health   Financial Resource Strain: Not on file  Food Insecurity: Not on file  Transportation Needs: Not on file  Physical Activity: Not on file  Stress: Not on file  Social Connections: Not on file  Intimate Partner Violence: Not on file    Outpatient Medications Prior to Visit  Medication Sig Dispense Refill  . aspirin EC 81 MG tablet Take 1 tablet (81 mg total) by mouth daily. 30 tablet 11  . atorvastatin (LIPITOR) 40 MG tablet Take 40 mg by mouth daily.    . carboxymethylcellulose (REFRESH PLUS) 0.5 % SOLN Place 1 drop into both eyes 3 (three) times daily as needed for dry  eyes (dry/irritated eyes).    . cholecalciferol (VITAMIN D) 1000 UNITS tablet Take 1,000 Units by mouth in the morning.    . cloNIDine (CATAPRES) 0.1 MG tablet Take 0.1 mg by mouth as needed (Take when sbp 170).    . gabapentin (NEURONTIN) 600 MG tablet Take 1 tablet (600 mg total) by mouth 3 (three) times daily. 270 tablet 1  . HYDROcodone-acetaminophen (NORCO/VICODIN) 5-325 MG tablet Take 1 tablet by mouth every 6 (six) hours as needed.    Marland Kitchen lisinopril (ZESTRIL) 10 MG tablet Take 1 tablet (10 mg total) by mouth daily. 90 tablet 0  . metFORMIN (GLUCOPHAGE) 500 MG tablet Take 500 mg by mouth daily.    . nitroGLYCERIN (NITROSTAT) 0.4 MG SL tablet Place 1 tablet (0.4 mg total) under the tongue every 5 (five) minutes x 3 doses as needed for chest pain. 25 tablet 12  . Omega-3 Fatty Acids (FISH OIL) 1200 MG CAPS Take 1,200 mg by mouth in the morning.    . pantoprazole (PROTONIX) 40 MG tablet Take 1 tablet (40 mg total) by mouth daily. 90 tablet 1  . tamsulosin (FLOMAX) 0.4 MG CAPS capsule Take 0.4 mg by mouth daily after supper.   1  . HYDROcodone-acetaminophen (NORCO) 10-325 MG tablet Take 1 tablet by  mouth every 4 (four) hours as needed for moderate pain ((score 4 to 6)). 30 tablet 0   No facility-administered medications prior to visit.    Allergies  Allergen Reactions  . Oxycontin [Oxycodone] Other (See Comments)    loopy    ROS Review of Systems  Constitutional:  Negative for appetite change, fatigue and unexpected weight change.  HENT:  Negative for congestion, ear pain, rhinorrhea, sinus pressure, sinus pain and tinnitus.   Eyes:  Negative for pain.  Respiratory:  Negative for cough and shortness of breath.   Cardiovascular:  Negative for chest pain, palpitations and leg swelling.  Gastrointestinal:  Positive for nausea. Negative for abdominal pain, constipation, diarrhea and vomiting.  Endocrine: Negative for cold intolerance, heat intolerance, polydipsia, polyphagia and polyuria.  Genitourinary:  Negative for dysuria, frequency and hematuria.  Musculoskeletal:  Negative for arthralgias, back pain, joint swelling and myalgias.  Skin:  Negative for rash.  Allergic/Immunologic: Negative for environmental allergies.  Neurological:  Positive for dizziness, weakness, light-headedness and headaches.       Impaired balance  Hematological:  Negative for adenopathy.  Psychiatric/Behavioral:  Negative for decreased concentration and sleep disturbance. The patient is not nervous/anxious.      Objective:    Physical Exam Vitals reviewed.  Constitutional:      Appearance: He is ill-appearing.  HENT:     Head: Normocephalic.     Mouth/Throat:     Mouth: Mucous membranes are moist.  Cardiovascular:     Rate and Rhythm: Normal rate. Rhythm irregular.  Pulmonary:     Effort: Pulmonary effort is normal.     Breath sounds: Normal breath sounds.  Abdominal:     General: Bowel sounds are normal.     Palpations: Abdomen is soft.  Musculoskeletal:     Cervical back: Neck supple.  Skin:    General: Skin is warm and dry.     Capillary Refill: Capillary refill takes less than 2  seconds.  Neurological:     Mental Status: He is alert and oriented to person, place, and time.     Cranial Nerves: Cranial nerves are intact.     Motor: Weakness present.     Coordination: Romberg sign  negative. Coordination normal. Finger-Nose-Finger Test and Heel to [facility] Test normal. Rapid alternating movements normal.     Gait: Gait abnormal. Tandem walk normal.     Deep Tendon Reflexes:     Reflex Scores:      Patellar reflexes are 2+ on the right side and 2+ on the left side.    Comments: Staggering with gait  Psychiatric:        Mood and Affect: Mood normal.        Behavior: Behavior normal.    BP 140/72 (BP Location: Left Arm, Patient Position: Sitting)   Pulse 68   Temp (!) 97.4 F (36.3 C) (Temporal)   Ht _0  (1.803 m)   Wt 194 lb (88 kg)   SpO2 98%   BMI 27.06 kg/m  Wt Readings from Last 3 Encounters:  10/18/20 194 lb (88 kg)  10/17/20 193 lb (87.5 kg)  09/24/20 195 lb (88.5 kg)     Health Maintenance Due  Topic Date Due  . OPHTHALMOLOGY EXAM  Never done  . TETANUS/TDAP  Never done  . Zoster Vaccines- Shingrix (1 of 2) Never done  . PNA vac Low Risk Adult (1 of 2 - PCV13) Never done  . INFLUENZA VACCINE  09/10/2020  . COVID-19 Vaccine (4 - Booster for Moderna series) 10/03/2020    There are no preventive care reminders to display for this patient.  Lab Results  Component Value Date   TSH 3.000 08/31/2019   Lab Results  Component Value Date   WBC 5.7 07/11/2020   HGB 14.4 07/11/2020   HCT 43.2 07/11/2020   MCV 92 07/11/2020   PLT 204 07/11/2020   Lab Results  Component Value Date   NA 146 (H) 07/11/2020   K 4.3 07/11/2020   CO2 23 07/11/2020   GLUCOSE 102 (H) 07/11/2020   BUN 9 07/11/2020   CREATININE 0.76 07/11/2020   BILITOT 1.0 07/11/2020   ALKPHOS 119 07/11/2020   AST 17 07/11/2020   ALT 19 07/11/2020   PROT 6.5 07/11/2020   ALBUMIN 4.4 07/11/2020   CALCIUM 9.5 07/11/2020   ANIONGAP 10 05/29/2020   EGFR 90 07/11/2020   Lab  Results  Component Value Date   CHOL 102 07/11/2020   Lab Results  Component Value Date   HDL 44 07/11/2020   Lab Results  Component Value Date   LDLCALC 38 07/11/2020   Lab Results  Component Value Date   TRIG 110 07/11/2020   Lab Results  Component Value Date   CHOLHDL 2.3 07/11/2020   Lab Results  Component Value Date   HGBA1C 6.2 (H) 05/29/2020      Assessment & Plan:   Problem List Items Addressed This Visit   None   No orders of the defined types were placed in this encounter.   Follow-up: No follow-ups on file.    Rip Harbour, NP

## 2020-10-18 NOTE — Patient Instructions (Addendum)
Take steroids as directed Take Zofran '4mg'$  for nausea as needed We will call you with MRI and neurologist appointment Seek emergency medical care for concerning symptoms   Trigeminal Neuralgia Trigeminal neuralgia is a nerve disorder that causes severe pain on one side of the face. The pain may last from a few seconds to several minutes. The pain is usually only on one side of the face. Symptoms may occur for days, weeks, or months and then go away for months or years. The pain may return and be worse than before. What are the causes? This condition is caused by damage or pressure to a nerve in the head that is called the trigeminal nerve. An attack can be triggered by: Talking. Chewing. Putting on makeup. Washing your face. Shaving your face. Brushing your teeth. Touching your face. What increases the risk? You are more likely to develop this condition if you: Are 59 years of age or older. Are male. What are the signs or symptoms? The main symptom of this condition is severe pain in the: Jaw. Lips. Eyes. Nose. Scalp. Forehead. Face. The pain may be: Intense. Stabbing. Electric. Shock-like. How is this diagnosed? This condition is diagnosed with a physical exam. A CT scan or an MRI may be done to rule out other conditions that can cause facial pain. How is this treated? This condition may be treated with: Avoiding the things that trigger your symptoms. Taking prescription medicines (anticonvulsants). Having surgery. This may be done in severe cases if other medical treatment does not provide relief. Having procedures such as ablation, thermal, or radiation therapy. It may take up to one month for treatment to start relieving the pain. Follow these instructions at home: Managing pain Learn as much as you can about how to manage your pain. Ask your health care provider if a pain specialist would be helpful. Consider talking with a mental health care provider  (psychologist) about how to cope with the pain. Consider joining a pain support group. General instructions Take over-the-counter and prescription medicines only as told by your health care provider. Avoid the things that trigger your symptoms. It may help to: Chew on the unaffected side of your mouth. Avoid touching your face. Avoid blasts of hot or cold air. Follow your treatment plan as told by your health care provider. This may include: Cognitive or behavioral therapy. Gentle, regular exercise. Meditation or yoga. Aromatherapy. Keep all follow-up visits as told by your health care provider. You may need to be monitored closely to make sure treatment is working well for you. Where to find more information [employer] Association: fpa-support.org Contact a health care provider if: Your medicine is not helping your symptoms. You have side effects from the medicine used for treatment. You develop new, unexplained symptoms, such as: Double vision. Facial weakness. Facial numbness. Changes in hearing or balance. You feel depressed. Get help right away if: Your pain is severe and is not getting better. You develop suicidal thoughts. If you ever feel like you may hurt yourself or others, or have thoughts about taking your own life, get help right away. You can go to your nearest emergency department or call: Your local emergency services (911 in the [place] A suicide crisis helpline, such as the [place] at 775-449-9516. This is open 24 hours a day. Summary Trigeminal neuralgia is a nerve disorder that causes severe pain on one side of the face. The pain may last from a few seconds to several minutes. This  condition is caused by damage or pressure to a nerve in the head that is called the trigeminal nerve. Treatment may include avoiding the things that trigger your symptoms, taking medicines, or having surgery or procedures. It may take up to one month for  treatment to start relieving the pain. Avoid the things that trigger your symptoms. Keep all follow-up visits as told by your health care provider. You may need to be monitored closely to make sure treatment is working well for you. This information is not intended to replace advice given to you by your health care provider. Make sure you discuss any questions you have with your health care provider. Document Revised: 12/14/2017 Document Reviewed: 12/14/2017 Elsevier Patient Education  2022 [employer].

## 2020-10-19 ENCOUNTER — Inpatient Hospital Stay: Payer: Medicare PPO | Admitting: Nurse Practitioner

## 2020-10-19 ENCOUNTER — Ambulatory Visit: Payer: Medicare PPO | Admitting: Family Medicine

## 2020-10-19 ENCOUNTER — Encounter: Payer: Self-pay | Admitting: Neurology

## 2020-10-19 ENCOUNTER — Ambulatory Visit
Admission: RE | Admit: 2020-10-19 | Discharge: 2020-10-19 | Disposition: A | Discharge status: Home / Self Care | Payer: Medicare PPO | Source: Ambulatory Visit | Attending: Nurse Practitioner | Admitting: Nurse Practitioner

## 2020-10-19 ENCOUNTER — Other Ambulatory Visit: Payer: Self-pay

## 2020-10-19 DIAGNOSIS — G44009 Cluster headache syndrome, unspecified, not intractable: Secondary | ICD-10-CM | POA: Diagnosis not present

## 2020-10-19 MED ORDER — GADOBENATE DIMEGLUMINE 529 MG/ML IV SOLN
18.0000 mL | Freq: Once | INTRAVENOUS | Status: AC | PRN
  Administered 2020-10-19: 18 mL via INTRAVENOUS

## 2020-10-20 ENCOUNTER — Encounter: Payer: Self-pay | Admitting: Family Medicine

## 2020-10-20 NOTE — Progress Notes (Signed)
Dictation on: 10/20/2020 12:52 PM by: Rochel Brome [id number]

## 2020-10-21 ENCOUNTER — Other Ambulatory Visit: Payer: Medicare PPO

## 2020-10-23 ENCOUNTER — Other Ambulatory Visit: Payer: Self-pay | Admitting: Neurological Surgery

## 2020-10-23 DIAGNOSIS — I10 Essential (primary) hypertension: Secondary | ICD-10-CM | POA: Diagnosis not present

## 2020-10-23 DIAGNOSIS — G4459 Other complicated headache syndrome: Secondary | ICD-10-CM

## 2020-10-23 DIAGNOSIS — Z6826 Body mass index (BMI) 26.0-26.9, adult: Secondary | ICD-10-CM | POA: Diagnosis not present

## 2020-10-24 ENCOUNTER — Other Ambulatory Visit: Payer: Self-pay

## 2020-10-24 ENCOUNTER — Other Ambulatory Visit: Payer: Self-pay | Admitting: Neurological Surgery

## 2020-10-24 ENCOUNTER — Ambulatory Visit
Admission: RE | Admit: 2020-10-24 | Discharge: 2020-10-24 | Disposition: A | Discharge status: Home / Self Care | Payer: Medicare PPO | Source: Ambulatory Visit | Attending: Neurological Surgery | Admitting: Neurological Surgery

## 2020-10-24 ENCOUNTER — Ambulatory Visit: Admission: RE | Admit: 2020-10-24 | Payer: Medicare PPO | Source: Ambulatory Visit

## 2020-10-24 DIAGNOSIS — G4459 Other complicated headache syndrome: Secondary | ICD-10-CM

## 2020-10-24 DIAGNOSIS — G44009 Cluster headache syndrome, unspecified, not intractable: Secondary | ICD-10-CM | POA: Diagnosis not present

## 2020-10-24 MED ORDER — DIAZEPAM 5 MG PO TABS
5.0000 mg | ORAL_TABLET | Freq: Once | ORAL | Status: AC
  Administered 2020-10-24: 5 mg via ORAL

## 2020-10-24 MED ORDER — IOPAMIDOL (ISOVUE-M 300) INJECTION 61%
10.0000 mL | Freq: Once | INTRAMUSCULAR | Status: AC
  Administered 2020-10-24: 10 mL via INTRATHECAL

## 2020-10-24 MED ORDER — ONDANSETRON 4 MG PO TBDP
4.0000 mg | ORAL_TABLET | Freq: Once | ORAL | Status: AC
  Administered 2020-10-24: 4 mg via ORAL

## 2020-10-24 MED ORDER — ONDANSETRON HCL 4 MG/2ML IJ SOLN: 4.0000 mg | Freq: Once | INTRAMUSCULAR | Status: DC | PRN

## 2020-10-24 MED ORDER — MEPERIDINE HCL 50 MG/ML IJ SOLN: 50.0000 mg | Freq: Once | INTRAMUSCULAR | Status: DC | PRN

## 2020-10-24 NOTE — Discharge Instructions (Signed)
CISTERNOGRAM Discharge Instructions  Go home and rest quietly as needed. You may resume normal activities; however, do not exert yourself strongly or do any heavy lifting today and tomorrow.   DO NOT drive today.    You may resume your normal diet and medications unless otherwise indicated. Drink lots of extra fluids today and tomorrow.   The incidence of headache, nausea, or vomiting is about 5% (one in 20 patients).  If you develop a headache, lie flat for 24 hours and drink plenty of fluids until the headache goes away.  Caffeinated beverages may be helpful. If when you get up you still have a headache when standing, go back to bed and force fluids for another 24 hours.   If you develop severe nausea and vomiting or a headache that does not go away with the flat bedrest after 48 hours, please call [phone].   Call your physician for a follow-up appointment.  The results of your myelogram will be sent directly to your physician by the following day.  If you have any questions or if complications develop after you arrive home, please call [phone].  Discharge instructions have been explained to the patient.  The patient, or the person responsible for the patient, fully understands these instructions.   Thank you for visiting our office today.

## 2020-10-24 NOTE — Discharge Instr - Other Orders (Signed)
Pt was nauseas upon standing up after rmyelogram procedure. Pt reports he has been having nausea and takes zofran at home for this. Zofran given prior to d/c. See MAR.

## 2020-10-25 DIAGNOSIS — I10 Essential (primary) hypertension: Secondary | ICD-10-CM | POA: Diagnosis not present

## 2020-10-25 DIAGNOSIS — G4459 Other complicated headache syndrome: Secondary | ICD-10-CM | POA: Diagnosis not present

## 2020-10-25 DIAGNOSIS — Z6827 Body mass index (BMI) 27.0-27.9, adult: Secondary | ICD-10-CM | POA: Diagnosis not present

## 2020-10-31 DIAGNOSIS — M47812 Spondylosis without myelopathy or radiculopathy, cervical region: Secondary | ICD-10-CM | POA: Diagnosis not present

## 2020-10-31 DIAGNOSIS — G4459 Other complicated headache syndrome: Secondary | ICD-10-CM | POA: Diagnosis not present

## 2020-10-31 DIAGNOSIS — M47816 Spondylosis without myelopathy or radiculopathy, lumbar region: Secondary | ICD-10-CM | POA: Diagnosis not present

## 2020-10-31 DIAGNOSIS — M47814 Spondylosis without myelopathy or radiculopathy, thoracic region: Secondary | ICD-10-CM | POA: Diagnosis not present

## 2020-11-05 ENCOUNTER — Other Ambulatory Visit: Payer: Self-pay | Admitting: Family Medicine

## 2020-11-13 DIAGNOSIS — Z6826 Body mass index (BMI) 26.0-26.9, adult: Secondary | ICD-10-CM | POA: Diagnosis not present

## 2020-11-13 DIAGNOSIS — Z981 Arthrodesis status: Secondary | ICD-10-CM | POA: Diagnosis not present

## 2020-11-13 DIAGNOSIS — I10 Essential (primary) hypertension: Secondary | ICD-10-CM | POA: Diagnosis not present

## 2020-11-18 ENCOUNTER — Other Ambulatory Visit: Payer: Self-pay | Admitting: Family Medicine

## 2020-11-20 NOTE — Progress Notes (Addendum)
NEUROLOGY CONSULTATION NOTE  [name] MRN: [id number] DOB: 02/06/1941  Referring provider: Jerrell Belfast, NP Primary care provider: Rochel Brome, MD  Reason for consult:  headache, facial pain, balance problems  Assessment/Plan:   New onset headache.  Some of his symptoms and MRI findings suggestive of intracranial hypotension but opening pressure normal and no evidence of CSF leak on cisternogram.  However, he has had clear rhinorrhea without evidence of sinusitis based on symptoms and imaging of sinuses.  Query if the leak has mostly healed and now maybe a sentinel leak.  Another consideration would be temporal arteritis.  Also consider secondary intracranial/extracranial arterial etiology..  Trigeminal neuralgia not suspected.    Will repeat MRI of brain with and without contrast to evaluate for any changes from previous findings.   Will also check MRA of head and neck to evaluate for cerebral aneurysm or carotid dissection. Check sed rate and CRP Will refer to [facility] ENT/Rhinology for evaluation of possible CSF leak due to skull/sinus defect.  If no leak is identified, would refer to [facility] Radiology. In the meantime, will treat headache with nortriptyline 10mg  at bedtime.  We can increase dose to 25mg  at bedtime in 4 weeks if needed.  Discontinue carbamazepine. Follow up after testing.   Subjective:  [name] is an 80 year old male with a fib, CAD, DM II, HTN, HLD  In early September, he developed a new headache described as a severe ice pick pain in his right temple.  It woke him up out of sleep.  It would last 10 seconds at a time and occur throughout the day.  The next day, he leaned over and clear fluid ran out of both nostrils.  He reported no upper respiratory symptoms.  He was seen in the ED at [facility] on 9/7 where he was diagnosed with trigeminal neuralgia and discharged on carbamazepine.  He then developed a persistent pressure pain behind and  around his right eye.  Laying down lessens the pain.  It is not aggravated by valsalva/coughing.  Bending over or turning neck to the right aggravates the pain.  Sometimes nausea.  No visual disturbance, autonomic symptoms, photophobia or phonophobia.  He also has become unsteady on his feet.  MRI of brain with and without contrast on 10/19/2020 personally reviewed and showed chronic small vessel ischemic changes in the cerebral white matter as well as mild prominence and hyperenhancement of the dura with enlargement and rounding of the left-greater-than right transverse sinus raising possibility of spontaneous intracranial hypotension.  He followed up with Dr. Sherley Bounds of neurosurgery.  He underwent CT cisternogram on 10/24/2020 which revealed opening pressure of 13 cm H2O and no evidence of CSF leak.  CT of sinuses revealed no evidence of bone defect/fracture or fluid opacification.  Headaches have only mildly improved.  In the morning, he may still have some drainage from the nose but it is much less than last month.  No preceding trauma or strenuous activity.  Patient has history of lumbar spine discectomy and fusion from L through L5.  CT lumbar myelogram on 04/25/2020 showed screw loosening bilaterally at L1 with pronounced lucency around the hardware.    Current medications:  carbamazepine 100mg  twice daily, gabapentin 600mg  TID, hydrocodone-acetaminophen, Zofran ODT 4mg   PAST MEDICAL HISTORY: Past Medical History:  Diagnosis Date   Angina pectoris ([place]) 08/02/2019   Arthritis    on meds   Atrial fibrillation ([place])    Backache 08/17/2012  Body mass index (BMI) 26.0-26.9, adult 05/08/2020   Cancer ([place]) 10/2015   prostate cancer treated with radiation   Carpal tunnel syndrome 01/18/2018   Cataract    sx-bilaterally   Cervical spondylosis without myelopathy 09/24/2015   Chest tightness 06/28/2019   Chronic back pain 05/30/2013   Colitis 11/22/2018   Coronary artery disease involving native  coronary artery without angina pectoris 06/17/2016   Deficiency of other specified B group vitamins 05/20/2019   Depression 04/04/2019   Diabetes mellitus due to underlying condition with unspecified complications ([place]) 5/78/4696   Diabetes mellitus without complication ([place])    Type II   Diarrhea 11/21/2018   Dyslipidemia 06/17/2016   Dyspnea on exertion 06/28/2019   Dysrhythmia    Essential (primary) hypertension 06/17/2016   Essential hypertension 06/17/2016   GERD (gastroesophageal reflux disease)    on meds   Greater trochanteric pain syndrome 07/03/2020   Headache    History of hiatal hernia    History of kidney stones    History of prostate cancer 05/20/2019   Hypercholesteremia 04/04/2019   Hyperlipemia    Hypertension    Impaired fasting blood sugar 05/20/2019   Impingement syndrome of shoulder region 01/12/2018   Low back pain 02/20/2014   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   Lumbar radiculopathy 05/24/2019   Malignant neoplasm prostate ([place]) 05/20/2019   Medicare annual wellness visit, subsequent 01/16/2020   Mixed hyperlipidemia 08/31/2019   Neck pain 07/03/2015   Paresthesia of hand 01/12/2018   Paresthesia of upper limb 01/12/2018   Paroxysmal atrial fibrillation ([place]) 04/04/2019   PONV (postoperative nausea and vomiting)    S/P cervical spinal fusion 03/04/2018   S/P lumbar fusion 12/09/2019   S/P lumbar spinal fusion 09/29/2013   Sepsis ([place]) 11/22/2018   Shoulder pain 01/12/2018   Spinal 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 01/26/2018   Stenosis of intervertebral foramina 01/12/2018   Trochanteric bursitis of right hip 04/19/2020    PAST SURGICAL HISTORY: Past Surgical History:  Procedure Laterality Date   anal fissures     ANTERIOR CERVICAL DECOMP/DISCECTOMY FUSION N/A 03/04/2018   Procedure: Anterior Cervical Decompression Fusion - Cervical three-Cervical four - Cervical four-Cervical five;  Surgeon: Eustace Moore, MD;  Location: [place];  Service: Neurosurgery;  Laterality: N/A;   ANTERIOR  LAT LUMBAR FUSION N/A 05/30/2020   Procedure: Anterior Lateral Lumbar Fusion Lumbar One-Two with Lateral Plate Removal of pedicle screws Lumbar One and Posterior Lateral Fusion Lumbar One-Lumbar Three;  Surgeon: Eustace Moore, MD;  Location: [place];  Service: Neurosurgery;  Laterality: N/A;   BACK SURGERY     CARDIAC CATHETERIZATION     no PCI   CARPAL TUNNEL RELEASE Left 03/04/2018   Procedure: Carpal Tunnel Release - left;  Surgeon: Eustace Moore, MD;  Location: [place];  Service: Neurosurgery;  Laterality: Left;   CHOLECYSTECTOMY     COLONOSCOPY W/ POLYPECTOMY  08/30/2012   Colonic polyp status post polypectomy.  Pancolonic diverticulosis predominantly in the sigmoid colon. Small internal hemorrhoids. Moderate internal hemorhroids.    ESOPHAGOGASTRODUODENOSCOPY  08/04/2016   Schatzkis ring status post esophageal dilatation. Small hiatal hernia. Mild gastritis.    EYE SURGERY     cataract removal bilateral eyes   FOOT SURGERY     left heel   HARDWARE REMOVAL N/A 05/30/2020   Procedure: HARDWARE REMOVAL;  Surgeon: Eustace Moore, MD;  Location: [place];  Service: Neurosurgery;  Laterality: N/A;   HERNIA REPAIR     LAMINECTOMY WITH POSTERIOR LATERAL ARTHRODESIS LEVEL 2  N/A 12/09/2019   Procedure: Laminectomy and Foraminotomy - Lumbar one-two, Lumbar two-three, posterolateral instrumented fusion Lumbar one-three, removal of instrumentation Lumbar three-five;  Surgeon: Eustace Moore, MD;  Location: [facility];  Service: Neurosurgery;  Laterality: N/A;   LEFT HEART CATH AND CORONARY ANGIOGRAPHY N/A 08/03/2019   Procedure: LEFT HEART CATH AND CORONARY ANGIOGRAPHY;  Surgeon: Jettie Booze, MD;  Location: [place] [facility];  Service: Cardiovascular;  Laterality: N/A;   SHOULDER SURGERY     bilateral   WISDOM TOOTH EXTRACTION      MEDICATIONS: Current Outpatient Medications on File Prior to Visit  Medication Sig Dispense Refill   amLODipine (NORVASC) 5 MG tablet Take 5 mg by mouth daily.      ASPIRIN LOW DOSE 81 MG EC tablet TAKE 1 TABLET BY MOUTH EVERY DAY 90 tablet 3   atorvastatin (LIPITOR) 40 MG tablet Take 40 mg by mouth daily.     CARBAMAZEPINE PO Take 100 mg by mouth in the morning and at bedtime.     carboxymethylcellulose (REFRESH PLUS) 0.5 % SOLN Place 1 drop into both eyes 3 (three) times daily as needed for dry eyes (dry/irritated eyes).     cholecalciferol (VITAMIN D) 1000 UNITS tablet Take 1,000 Units by mouth in the morning.     cloNIDine (CATAPRES) 0.1 MG tablet Take 0.1 mg by mouth as needed (Take when sbp 170).     gabapentin (NEURONTIN) 600 MG tablet Take 1 tablet (600 mg total) by mouth 3 (three) times daily. 270 tablet 1   HYDROcodone-acetaminophen (NORCO/VICODIN) 5-325 MG tablet Take 1 tablet by mouth every 6 (six) hours as needed.     lisinopril (ZESTRIL) 10 MG tablet Take 1 tablet (10 mg total) by mouth daily. 90 tablet 0   metFORMIN (GLUCOPHAGE) 500 MG tablet Take 500 mg by mouth daily.     nitroGLYCERIN (NITROSTAT) 0.4 MG SL tablet Place 1 tablet (0.4 mg total) under the tongue every 5 (five) minutes x 3 doses as needed for chest pain. 25 tablet 12   Omega-3 Fatty Acids (FISH OIL) 1200 MG CAPS Take 1,200 mg by mouth in the morning.     ondansetron (ZOFRAN ODT) 4 MG disintegrating tablet Take 1 tablet (4 mg total) by mouth every 8 (eight) hours as needed for nausea or vomiting. 20 tablet 0   pantoprazole (PROTONIX) 40 MG tablet Take 1 tablet (40 mg total) by mouth daily. 90 tablet 1   predniSONE (DELTASONE) 20 MG tablet Take 1 tablet (20 mg total) by mouth daily with breakfast. 1 po tid for 3 days then 1 po bid for 3 days then 1 po qd for 3 days 18 tablet 0   tamsulosin (FLOMAX) 0.4 MG CAPS capsule Take 0.4 mg by mouth daily after supper.   1   No current facility-administered medications on file prior to visit.    ALLERGIES: Allergies  Allergen Reactions   Oxycontin [Oxycodone] Other (See Comments)    loopy    FAMILY HISTORY: Family History  Problem  Relation Age of Onset   Colon cancer Neg Hx    Esophageal cancer Neg Hx    Rectal cancer Neg Hx    Stomach cancer Neg Hx    Colon polyps Neg Hx     Objective:  Blood pressure (!) 158/90, pulse 82, height 5\' 11"  (1.803 m), weight 195 lb 3.2 oz (88.5 kg), SpO2 97 %. General: No acute distress.  Patient appears well-groomed.   Head:  Normocephalic/atraumatic Eyes:  fundi examined  but not visualized Neck: supple, mild right sided tenderness, full range of motion Back: No paraspinal tenderness Heart: regular rate and rhythm Lungs: Clear to auscultation bilaterally. Vascular: No carotid bruits. Neurological Exam: Mental status: alert and oriented to person, place, and time, recent and remote memory intact, fund of knowledge intact, attention and concentration intact, speech fluent and not dysarthric, language intact. Cranial nerves: CN I: not tested CN II: pupils equal, round and reactive to light, visual fields intact CN III, IV, VI:  full range of motion, no nystagmus, no ptosis CN V: facial sensation intact. CN VII: upper and lower face symmetric CN VIII: hearing intact CN IX, X: gag intact, uvula midline CN XI: sternocleidomastoid and trapezius muscles intact CN XII: tongue midline Bulk & Tone: normal, no fasciculations. Motor:  muscle strength 5/5 throughout Sensation:  Pinprick sensation intact, vibratory sensation reduced in feet. Deep Tendon Reflexes:  2+ throughout,  toes downgoing.   Finger to nose testing:  Without dysmetria.   Heel to shin:  Without dysmetria.   Gait:  Slightly broad-based gait.  Occasional stumble.  Able to turn.  Trouble with tandem walk.  Romberg with mild sway but negative.    Thank you for allowing me to take part in the care of this patient.  Metta Clines, DO  CC:  Rochel Brome, MD  Jerrell Belfast, NP

## 2020-11-21 ENCOUNTER — Other Ambulatory Visit: Payer: Self-pay

## 2020-11-21 ENCOUNTER — Other Ambulatory Visit (INDEPENDENT_AMBULATORY_CARE_PROVIDER_SITE_OTHER): Payer: Medicare PPO

## 2020-11-21 ENCOUNTER — Encounter: Payer: Self-pay | Admitting: Neurology

## 2020-11-21 ENCOUNTER — Ambulatory Visit: Payer: Medicare PPO | Admitting: Neurology

## 2020-11-21 VITALS — BP 158/90 | HR 82 | Ht 71.0 in | Wt 195.2 lb

## 2020-11-21 DIAGNOSIS — G9601 Cranial cerebrospinal fluid leak, spontaneous: Secondary | ICD-10-CM

## 2020-11-21 DIAGNOSIS — R519 Headache, unspecified: Secondary | ICD-10-CM | POA: Diagnosis not present

## 2020-11-21 LAB — C-REACTIVE PROTEIN: CRP: <1 mg/dL (ref 0.5–20.0)

## 2020-11-21 LAB — SEDIMENTATION RATE: Sed Rate: 14 mm/hr (ref 0–20)

## 2020-11-21 MED ORDER — NORTRIPTYLINE HCL 10 MG PO CAPS: 10.0000 mg | ORAL_CAPSULE | Freq: Every day | ORAL | 5 refills | Status: DC

## 2020-11-21 NOTE — Addendum Note (Signed)
Addended by: Venetia Night on: 11/21/2020 02:09 PM   Modules accepted: Orders

## 2020-11-21 NOTE — Patient Instructions (Addendum)
Will repeat MRI of brain with and without contrast, as well as MRA of head and neck to evaluate for vascular cause of headaches We have sent a referral to [facility] for your MRI and they will call you directly to schedule your appointment. They are located at [place]. If you need to contact them directly please call [phone].  Will check labs:  sed rate and CRP. Your provider has requested that you have labwork completed today. Please go to [facility] ([id number]) on the second floor of this building before leaving the office today. You do not need to check in. If you are not called within 15 minutes please check with the front desk.   Will contact ENT offices to see if they can evaluate and obtain the clear fluid for testing Further recommendations pending results.   Start nortriptyline 10mg  at bedtime.  If no improvement in headache in 4 weeks, contact me and we can increase dose Stop carbamazepine Follow up after testing

## 2020-11-22 DIAGNOSIS — H40023 Open angle with borderline findings, high risk, bilateral: Secondary | ICD-10-CM | POA: Diagnosis not present

## 2020-11-22 DIAGNOSIS — H43393 Other vitreous opacities, bilateral: Secondary | ICD-10-CM | POA: Diagnosis not present

## 2020-11-22 DIAGNOSIS — Z961 Presence of intraocular lens: Secondary | ICD-10-CM | POA: Diagnosis not present

## 2020-11-22 DIAGNOSIS — H524 Presbyopia: Secondary | ICD-10-CM | POA: Diagnosis not present

## 2020-11-22 DIAGNOSIS — H35363 Drusen (degenerative) of macula, bilateral: Secondary | ICD-10-CM | POA: Diagnosis not present

## 2020-12-05 ENCOUNTER — Telehealth: Payer: Self-pay | Admitting: Neurology

## 2020-12-05 NOTE — Telephone Encounter (Signed)
Pt daughter called to let jaffe know he has his appts for the test. The test is 12/08/20 he would like 2 nerve pills if possible.

## 2020-12-06 ENCOUNTER — Other Ambulatory Visit: Payer: Self-pay | Admitting: Neurology

## 2020-12-06 MED ORDER — DIAZEPAM 5 MG PO TABS: ORAL_TABLET | ORAL | 0 refills | Status: DC

## 2020-12-06 NOTE — Progress Notes (Unsigned)
valiaze

## 2020-12-06 NOTE — Telephone Encounter (Signed)
Advised pt daughter of script.  Per Pt daughter pt needs to have two valiums. One will not help him.

## 2020-12-06 NOTE — Telephone Encounter (Signed)
Pt daughter advised one tab of valium sent to the Pharmacy.

## 2020-12-08 ENCOUNTER — Ambulatory Visit
Admission: RE | Admit: 2020-12-08 | Discharge: 2020-12-08 | Disposition: A | Discharge status: Home / Self Care | Payer: Medicare PPO | Source: Ambulatory Visit | Attending: Neurology | Admitting: Neurology

## 2020-12-08 ENCOUNTER — Other Ambulatory Visit: Payer: Self-pay

## 2020-12-08 DIAGNOSIS — G9601 Cranial cerebrospinal fluid leak, spontaneous: Secondary | ICD-10-CM

## 2020-12-08 DIAGNOSIS — R519 Headache, unspecified: Secondary | ICD-10-CM

## 2020-12-08 DIAGNOSIS — R262 Difficulty in walking, not elsewhere classified: Secondary | ICD-10-CM | POA: Diagnosis not present

## 2020-12-08 MED ORDER — GADOBENATE DIMEGLUMINE 529 MG/ML IV SOLN
18.0000 mL | Freq: Once | INTRAVENOUS | Status: AC | PRN
  Administered 2020-12-08: 18 mL via INTRAVENOUS

## 2020-12-11 NOTE — Progress Notes (Signed)
Pt advised of his results. Pt schedule to [facility] 12/25/20 at 10:40 am

## 2020-12-14 ENCOUNTER — Encounter: Payer: Self-pay | Admitting: Neurology

## 2020-12-14 ENCOUNTER — Other Ambulatory Visit: Payer: Self-pay | Admitting: Neurology

## 2020-12-14 ENCOUNTER — Ambulatory Visit: Payer: Medicare PPO | Admitting: Neurology

## 2020-12-14 ENCOUNTER — Other Ambulatory Visit: Payer: Self-pay

## 2020-12-14 VITALS — BP 154/91 | HR 68 | Ht 71.0 in | Wt 196.0 lb

## 2020-12-14 DIAGNOSIS — Z79899 Other long term (current) drug therapy: Secondary | ICD-10-CM

## 2020-12-14 MED ORDER — DIVALPROEX SODIUM ER 250 MG PO TB24: ORAL_TABLET | ORAL | 0 refills | Status: DC

## 2020-12-14 NOTE — Progress Notes (Signed)
Patient advised to let his PCP know if his bp goes any  higher then it was at visit today.

## 2020-12-14 NOTE — Progress Notes (Addendum)
NEUROLOGY FOLLOW UP OFFICE NOTE  [name] [id number]  Assessment/Plan:   New onset headache.  Some of his symptoms and MRI findings suggestive of intracranial hypotension but opening pressure normal and no evidence of CSF leak on cisternogram.  However, he has had clear rhinorrhea without evidence of sinusitis based on symptoms and imaging of sinuses.  Query if the leak has mostly healed and now maybe a sentinel leak.  Also consider cluster headache although he reports that he was given O2 in the ED which didn't help.   Has appointment at [facility] ENT/Rhinology for evaluation of possible CSF leak due to skull/sinus defect.  If no leak is identified, would consider referral to [facility] Radiology. In case these may be cluster headache, will try Depakote ER 500mg  at bedtime (given his a fib, will hold off on verapamil unless he is cleared by his cardiologist).  Will have him discontinue nortriptyline. Follow up   Subjective:  [name] is an 80 year old male with a fib, CAD, DM II, HTN, HLD who follows up for headache.  UPDATE: Current gabapentin 600mg  TID, hydrocodone-acetaminophen, Zofran ODT 4mg   Sed rate and CRP on 11/21/2020 were 14 and < 1.0 respectively.  MRI of brain with and without contrast on 12/08/2020 personally reviewed, showed unchanged dural enhancement no new or progressive abnormalities.  MRA of head was normal.  Headaches are worse.  It is an aching across the forehead but has sharp paroxysmal stabbing pain in the right temple.  It is not positional.  Balance is worse.  Feels dizzy.  Still notes small amount of clear rhinorrhea.      HISTORY: In early September, he developed a new headache described as a severe ice pick pain in his right temple.  It woke him up out of sleep.  It would last 10 seconds at a time and occur throughout the day.  The next day, he leaned over and clear fluid ran out of both nostrils.  He reported no upper respiratory symptoms.  He  was seen in the ED at [facility] on 9/7 where he was diagnosed with trigeminal neuralgia and discharged on carbamazepine.  He then developed a persistent pressure pain behind and around his right eye.  Laying down lessens the pain.  It is not aggravated by valsalva/coughing.  Bending over or turning neck to the right aggravates the pain.  Sometimes nausea.  No visual disturbance, autonomic symptoms, photophobia or phonophobia.  He also has become unsteady on his feet.  MRI of brain with and without contrast on 10/19/2020 showed chronic small vessel ischemic changes in the cerebral white matter as well as mild prominence and hyperenhancement of the dura with enlargement and rounding of the left-greater-than right transverse sinus raising possibility of spontaneous intracranial hypotension.  He followed up with Dr. Sherley Bounds of neurosurgery.  He underwent CT cisternogram on 10/24/2020 which revealed opening pressure of 13 cm H2O and no evidence of CSF leak.  CT of sinuses revealed no evidence of bone defect/fracture or fluid opacification.  Headaches have only mildly improved.  In the morning, he may still have some drainage from the nose but it is much less than last month.  No preceding trauma or strenuous activity.   Patient has history of lumbar spine discectomy and fusion from L through L5.  CT lumbar myelogram on 04/25/2020 showed screw loosening bilaterally at L1 with pronounced lucency around the hardware.     Past medication: carbamazepine  PAST MEDICAL  HISTORY: Past Medical History:  Diagnosis Date   Angina pectoris ([place]) 08/02/2019   Arthritis    on meds   Atrial fibrillation ([place])    Backache 08/17/2012   Body mass index (BMI) 26.0-26.9, adult 05/08/2020   Cancer ([place]) 10/2015   prostate cancer treated with radiation   Carpal tunnel syndrome 01/18/2018   Cataract    sx-bilaterally   Cervical spondylosis without myelopathy 09/24/2015   Chest tightness 06/28/2019   Chronic back pain  05/30/2013   Colitis 11/22/2018   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 06/17/2016   Deficiency of other specified B group vitamins 05/20/2019   Depression 04/04/2019   Diabetes mellitus due to underlying condition with unspecified complications ([place]) 6/81/2751   Diabetes mellitus without complication ([place])    Type II   Diarrhea 11/21/2018   Dyslipidemia 06/17/2016   Dyspnea on exertion 06/28/2019   Dysrhythmia    Essential (primary) hypertension 06/17/2016   Essential hypertension 06/17/2016   GERD (gastroesophageal reflux disease)    on meds   Greater trochanteric pain syndrome 07/03/2020   Headache    History of hiatal hernia    History of kidney stones    History of prostate cancer 05/20/2019   Hypercholesteremia 04/04/2019   Hyperlipemia    Hypertension    Impaired fasting blood sugar 05/20/2019   Impingement syndrome of shoulder region 01/12/2018   Low back pain 02/20/2014   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   Lumbar radiculopathy 05/24/2019   Malignant neoplasm prostate ([place]) 05/20/2019   Medicare annual wellness visit, subsequent 01/16/2020   Mixed hyperlipidemia 08/31/2019   Neck pain 07/03/2015   Paresthesia of hand 01/12/2018   Paresthesia of upper limb 01/12/2018   Paroxysmal atrial fibrillation ([place]) 04/04/2019   PONV (postoperative nausea and vomiting)    S/P cervical spinal fusion 03/04/2018   S/P lumbar fusion 12/09/2019   S/P lumbar spinal fusion 09/29/2013   Sepsis ([place]) 11/22/2018   Shoulder pain 01/12/2018   Spinal 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 01/26/2018   Stenosis of intervertebral foramina 01/12/2018   Trochanteric bursitis of right hip 04/19/2020    MEDICATIONS: Current Outpatient Medications on File Prior to Visit  Medication Sig Dispense Refill   amLODipine (NORVASC) 5 MG tablet Take 5 mg by mouth daily.     ASPIRIN LOW DOSE 81 MG EC tablet TAKE 1 TABLET BY MOUTH EVERY DAY 90 tablet 3   atorvastatin (LIPITOR) 40 MG tablet Take 20 mg  by mouth daily.     carboxymethylcellulose (REFRESH PLUS) 0.5 % SOLN Place 1 drop into both eyes 3 (three) times daily as needed for dry eyes (dry/irritated eyes).     cholecalciferol (VITAMIN D) 1000 UNITS tablet Take 1,000 Units by mouth in the morning.     cloNIDine (CATAPRES) 0.1 MG tablet Take 0.1 mg by mouth as needed (Take when sbp 170).     diazepam (VALIUM) 5 MG tablet Take 1 tablet 30-40 minutes prior to MRI.  May repeat at MRI. 2 tablet 0   gabapentin (NEURONTIN) 600 MG tablet Take 1 tablet (600 mg total) by mouth 3 (three) times daily. 270 tablet 1   HYDROcodone-acetaminophen (NORCO/VICODIN) 5-325 MG tablet Take 1 tablet by mouth every 6 (six) hours as needed.     lisinopril (ZESTRIL) 10 MG tablet Take 1 tablet (10 mg total) by mouth daily. 90 tablet 0   metFORMIN (GLUCOPHAGE) 500 MG tablet Take 500 mg by mouth daily.     nitroGLYCERIN (NITROSTAT) 0.4 MG SL tablet  Place 1 tablet (0.4 mg total) under the tongue every 5 (five) minutes x 3 doses as needed for chest pain. 25 tablet 12   nortriptyline (PAMELOR) 10 MG capsule Take 1 capsule (10 mg total) by mouth at bedtime. 30 capsule 5   Omega-3 Fatty Acids (FISH OIL) 1200 MG CAPS Take 1,200 mg by mouth in the morning.     ondansetron (ZOFRAN ODT) 4 MG disintegrating tablet Take 1 tablet (4 mg total) by mouth every 8 (eight) hours as needed for nausea or vomiting. 20 tablet 0   pantoprazole (PROTONIX) 40 MG tablet Take 1 tablet (40 mg total) by mouth daily. 90 tablet 1   tamsulosin (FLOMAX) 0.4 MG CAPS capsule Take 0.4 mg by mouth daily after supper.  (Patient not taking: Reported on 11/21/2020)  1   No current facility-administered medications on file prior to visit.    ALLERGIES: Allergies  Allergen Reactions   Oxycontin [Oxycodone] Other (See Comments)    loopy    FAMILY HISTORY: Family History  Problem Relation Age of Onset   Colon cancer Neg Hx    Esophageal cancer Neg Hx    Rectal cancer Neg Hx    Stomach cancer Neg Hx     Colon polyps Neg Hx       Objective:  Blood pressure (!) 154/91, pulse 68, height 5\' 11"  (1.803 m), weight 196 lb (88.9 kg), SpO2 91 %. General: No acute distress.  Patient appears well-groomed.    Metta Clines, DO  CC: Rochel Brome, MD

## 2020-12-14 NOTE — Progress Notes (Signed)
Tried calling pt, Patient needs to stop Nortriptyline.  No answer,unable to LMOVM.

## 2020-12-14 NOTE — Patient Instructions (Signed)
Start Depakote ER (divalproate) 250mg  - take 1 pill at bedtime for one week, then increase to 2 pills at bedtime Check CBC and CMP Follow up with [facility] - if testing unremarkable, consider referral to [facility] Follow up

## 2020-12-15 ENCOUNTER — Other Ambulatory Visit: Payer: Self-pay | Admitting: Neurology

## 2020-12-20 ENCOUNTER — Other Ambulatory Visit: Payer: Self-pay | Admitting: Family Medicine

## 2020-12-22 ENCOUNTER — Other Ambulatory Visit: Payer: Self-pay | Admitting: Family Medicine

## 2020-12-24 ENCOUNTER — Telehealth: Payer: Self-pay | Admitting: Neurology

## 2020-12-24 MED ORDER — NORTRIPTYLINE HCL 10 MG PO CAPS: ORAL_CAPSULE | ORAL | 0 refills | Status: DC

## 2020-12-24 NOTE — Telephone Encounter (Signed)
Pt's daughter called in stating the patient has started having discolored urine. It is a brownish red color. They are thinking it might be from the Depakote.

## 2020-12-24 NOTE — Telephone Encounter (Signed)
Per Pt daughter, her father called late last night, and advised her that his urine been a wired color since starting the Depakote.  Per patient the medication not helping with his headache and the urine he had yesterday and it was bright Red. Pt advised his daughter after searching side effects of the medication he stopped taking it and his urine is back to normal color.   Advised patient daughter to call and advised the PCP as well of this and we will send a message back to DR.Jaffe as well. Only pain the patient has is his head is hurting. No Urinary pain at all.    Please advise

## 2020-12-24 NOTE — Telephone Encounter (Signed)
Then we can restart nortriptyline but I will titrate up - 10mg  at bedtime for one week, then increase to 20mg  at bedtime.  We can increase dose in 4-5 weeks if needed - headache relief may take at least 4 weeks to see improvement

## 2020-12-25 DIAGNOSIS — J342 Deviated nasal septum: Secondary | ICD-10-CM | POA: Diagnosis not present

## 2020-12-25 DIAGNOSIS — Z87891 Personal history of nicotine dependence: Secondary | ICD-10-CM | POA: Diagnosis not present

## 2020-12-25 DIAGNOSIS — J343 Hypertrophy of nasal turbinates: Secondary | ICD-10-CM | POA: Diagnosis not present

## 2020-12-25 DIAGNOSIS — G9601 Cranial cerebrospinal fluid leak, spontaneous: Secondary | ICD-10-CM | POA: Diagnosis not present

## 2020-12-25 DIAGNOSIS — Z885 Allergy status to narcotic agent status: Secondary | ICD-10-CM | POA: Diagnosis not present

## 2020-12-25 DIAGNOSIS — R221 Localized swelling, mass and lump, neck: Secondary | ICD-10-CM | POA: Diagnosis not present

## 2020-12-28 NOTE — Progress Notes (Signed)
NEUROLOGY FOLLOW UP OFFICE NOTE  [name] [id number]  Assessment/Plan:   New onset headaches - given brain MRI findings, consider intracranial hypotension due to CSF leak.  Skull based leak unlikely, consider spontaneous leak somewhere along the spinal canal.  Start nortriptyline 10mg  at bedtime for one week, then increase to 20mg  at bedtime Refer to [facility] Radiology for CT myelogram for evaluation and potential treatment of CSF leak Follow up after visit.  Subjective:  [name] is an 80 year old male with a fib, CAD, DM II, HTN, HLD who follows up for headache.  He is accompanied by his daughter.   UPDATE: He saw Dr. Anner Crete of [facility] Rhinology, Sinus, and Skull Base Surgery on 12/25/2020.  Plan is to collect drainage for Beta-2 Transferrin testing.  However, he has not produced any fluid to collect.  However, there is doubt that it is CSF.  He had side effects to Depakote, so he was switched back to nortriptyline last week, but he hasn't started it yet.  He reports daily mild across forehead with mild nausea, lasting an hour to several hours.    HISTORY: In early September 2022, he developed a new headache described as a severe ice pick pain in his right temple.  It woke him up out of sleep.  It would last 10 seconds at a time and occur throughout the day.  The next day, he leaned over and clear fluid ran out of both nostrils.  He reported no upper respiratory symptoms.  He was seen in the ED at [facility] on 9/7 where he was diagnosed with trigeminal neuralgia and discharged on carbamazepine.  He then developed a persistent pressure pain behind and around his right eye.  Laying down lessens the pain.  It is not aggravated by valsalva/coughing.  Bending over or turning neck to the right aggravates the pain.  Sometimes nausea.  No visual disturbance, autonomic symptoms, photophobia or phonophobia.  He also has become unsteady on his feet.  MRI of brain  with and without contrast on 10/19/2020 showed chronic small vessel ischemic changes in the cerebral white matter as well as mild prominence and hyperenhancement of the dura with enlargement and rounding of the left-greater-than right transverse sinus raising possibility of spontaneous intracranial hypotension.  He followed up with Dr. Sherley Bounds of neurosurgery.  He underwent CT cisternogram on 10/24/2020 which revealed opening pressure of 13 cm H2O and no evidence of CSF leak.  CT of sinuses revealed no evidence of bone defect/fracture or fluid opacification. Sed rate and CRP on 11/21/2020 were 14 and < 1.0 respectively.  MRI of brain with and without contrast on 12/08/2020 personally reviewed, showed unchanged dural enhancement no new or progressive abnormalities.  MRA of head was normal.  n the morning, he may still have some drainage from the nose but it is much less than last month.  No preceding trauma or strenuous activity.   Patient has history of lumbar spine discectomy and fusion from L through L5.  CT lumbar myelogram on 04/25/2020 showed screw loosening bilaterally at L1 with pronounced lucency around the hardware.     Past medication: carbamazepine, gabapentin (side effects)  PAST MEDICAL HISTORY: Past Medical History:  Diagnosis Date   Angina pectoris ([place]) 08/02/2019   Arthritis    on meds   Atrial fibrillation ([place])    Backache 08/17/2012   Body mass index (BMI) 26.0-26.9, adult 05/08/2020   Cancer ([place]) 10/2015   prostate  cancer treated with radiation   Carpal tunnel syndrome 01/18/2018   Cataract    sx-bilaterally   Cervical spondylosis without myelopathy 09/24/2015   Chest tightness 06/28/2019   Chronic back pain 05/30/2013   Colitis 11/22/2018   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 06/17/2016   Deficiency of other specified B group vitamins 05/20/2019   Depression 04/04/2019   Diabetes mellitus due to underlying condition with unspecified  complications ([place]) 06/20/2583   Diabetes mellitus without complication ([place])    Type II   Diarrhea 11/21/2018   Dyslipidemia 06/17/2016   Dyspnea on exertion 06/28/2019   Dysrhythmia    Essential (primary) hypertension 06/17/2016   Essential hypertension 06/17/2016   GERD (gastroesophageal reflux disease)    on meds   Greater trochanteric pain syndrome 07/03/2020   Headache    History of hiatal hernia    History of kidney stones    History of prostate cancer 05/20/2019   Hypercholesteremia 04/04/2019   Hyperlipemia    Hypertension    Impaired fasting blood sugar 05/20/2019   Impingement syndrome of shoulder region 01/12/2018   Low back pain 02/20/2014   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   Lumbar radiculopathy 05/24/2019   Malignant neoplasm prostate ([place]) 05/20/2019   Medicare annual wellness visit, subsequent 01/16/2020   Mixed hyperlipidemia 08/31/2019   Neck pain 07/03/2015   Paresthesia of hand 01/12/2018   Paresthesia of upper limb 01/12/2018   Paroxysmal atrial fibrillation ([place]) 04/04/2019   PONV (postoperative nausea and vomiting)    S/P cervical spinal fusion 03/04/2018   S/P lumbar fusion 12/09/2019   S/P lumbar spinal fusion 09/29/2013   Sepsis ([place]) 11/22/2018   Shoulder pain 01/12/2018   Spinal 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 01/26/2018   Stenosis of intervertebral foramina 01/12/2018   Trochanteric bursitis of right hip 04/19/2020    MEDICATIONS: Current Outpatient Medications on File Prior to Visit  Medication Sig Dispense Refill   amLODipine (NORVASC) 5 MG tablet Take 5 mg by mouth daily.     ASPIRIN LOW DOSE 81 MG EC tablet TAKE 1 TABLET BY MOUTH EVERY DAY 90 tablet 3   atorvastatin (LIPITOR) 40 MG tablet TAKE 1 TABLET EVERY DAY  (NEW DOSE) 90 tablet 1   carboxymethylcellulose (REFRESH PLUS) 0.5 % SOLN Place 1 drop into both eyes 3 (three) times daily as needed for dry eyes (dry/irritated eyes).     cholecalciferol (VITAMIN D) 1000 UNITS tablet Take 1,000 Units by mouth in the  morning.     cloNIDine (CATAPRES) 0.1 MG tablet Take 0.1 mg by mouth as needed (Take when sbp 170).     diazepam (VALIUM) 5 MG tablet Take 1 tablet 30-40 minutes prior to MRI.  May repeat at MRI. 2 tablet 0   divalproex (DEPAKOTE ER) 250 MG 24 hr tablet TAKE 1 TABLET AT BEDTIME FOR ONE WEEK, THEN INCREASE TO 2 TABLETS AT BEDTIME 60 tablet 0   gabapentin (NEURONTIN) 600 MG tablet Take 1 tablet (600 mg total) by mouth 3 (three) times daily. 270 tablet 1   HYDROcodone-acetaminophen (NORCO/VICODIN) 5-325 MG tablet Take 1 tablet by mouth every 6 (six) hours as needed.     lisinopril (ZESTRIL) 10 MG tablet TAKE 1 TABLET (10 MG TOTAL) BY MOUTH DAILY. 90 tablet 0   metFORMIN (GLUCOPHAGE) 500 MG tablet Take 500 mg by mouth daily.     nitroGLYCERIN (NITROSTAT) 0.4 MG SL tablet Place 1 tablet (0.4 mg total) under the tongue every 5 (five) minutes x 3 doses as  needed for chest pain. 25 tablet 12   nortriptyline (PAMELOR) 10 MG capsule Take 10mg  at bedtime for one week, then increase to 20mg  at bedtime. We can increase dose in 4-5 weeks if needed 60 capsule 0   Omega-3 Fatty Acids (FISH OIL) 1200 MG CAPS Take 1,200 mg by mouth in the morning.     ondansetron (ZOFRAN ODT) 4 MG disintegrating tablet Take 1 tablet (4 mg total) by mouth every 8 (eight) hours as needed for nausea or vomiting. 20 tablet 0   pantoprazole (PROTONIX) 40 MG tablet Take 1 tablet (40 mg total) by mouth daily. 90 tablet 1   tamsulosin (FLOMAX) 0.4 MG CAPS capsule Take 0.4 mg by mouth daily after supper.  1   No current facility-administered medications on file prior to visit.    ALLERGIES: Allergies  Allergen Reactions   Oxycontin [Oxycodone] Other (See Comments)    loopy    FAMILY HISTORY: Family History  Problem Relation Age of Onset   Colon cancer Neg Hx    Esophageal cancer Neg Hx    Rectal cancer Neg Hx    Stomach cancer Neg Hx    Colon polyps Neg Hx       Objective:  Blood pressure (!) 162/78, pulse 87, height 5'  10" (1.778 m), weight 194 lb (88 kg), SpO2 96 %. General: No acute distress.  Patient appears well-groomed.    Metta Clines, DO  CC: Rochel Brome, MD

## 2020-12-31 ENCOUNTER — Ambulatory Visit: Payer: Medicare PPO | Admitting: Neurology

## 2020-12-31 ENCOUNTER — Encounter: Payer: Self-pay | Admitting: Neurology

## 2020-12-31 ENCOUNTER — Other Ambulatory Visit: Payer: Self-pay

## 2020-12-31 VITALS — BP 162/78 | HR 87 | Ht 70.0 in | Wt 194.0 lb

## 2020-12-31 DIAGNOSIS — R519 Headache, unspecified: Secondary | ICD-10-CM

## 2020-12-31 DIAGNOSIS — G96810 Intracranial hypotension, unspecified: Secondary | ICD-10-CM

## 2020-12-31 NOTE — Patient Instructions (Signed)
Start nortriptyline 10mg  - take 1 pill at bedtime for one week, then increase to 2 pills at bedtime.  No improvement in 4 weeks on 2 pills, then contact me and we can increase dose Refer to [facility] Radiology for evaluation of CSF leak Follow up

## 2021-01-07 ENCOUNTER — Telehealth: Payer: Self-pay | Admitting: Cardiology

## 2021-01-07 ENCOUNTER — Other Ambulatory Visit: Payer: Self-pay

## 2021-01-07 ENCOUNTER — Ambulatory Visit: Payer: Medicare PPO | Admitting: Nurse Practitioner

## 2021-01-07 ENCOUNTER — Encounter: Payer: Self-pay | Admitting: Nurse Practitioner

## 2021-01-07 VITALS — BP 154/98 | HR 94 | Temp 97.4°F | Ht 71.0 in | Wt 195.0 lb

## 2021-01-07 DIAGNOSIS — Z87442 Personal history of urinary calculi: Secondary | ICD-10-CM

## 2021-01-07 DIAGNOSIS — R319 Hematuria, unspecified: Secondary | ICD-10-CM | POA: Diagnosis not present

## 2021-01-07 DIAGNOSIS — R1032 Left lower quadrant pain: Secondary | ICD-10-CM | POA: Diagnosis not present

## 2021-01-07 DIAGNOSIS — Z8546 Personal history of malignant neoplasm of prostate: Secondary | ICD-10-CM | POA: Diagnosis not present

## 2021-01-07 LAB — POCT URINALYSIS DIPSTICK
Bilirubin, UA: NEGATIVE
Glucose, UA: NEGATIVE
Ketones, UA: NEGATIVE
Leukocytes, UA: NEGATIVE
Nitrite, UA: NEGATIVE
Protein, UA: POSITIVE — AB
Spec Grav, UA: 1.02 (ref 1.010–1.025)
Urobilinogen, UA: 0.2 E.U./dL
pH, UA: 5.5 (ref 5.0–8.0)

## 2021-01-07 NOTE — Progress Notes (Signed)
Acute Office Visit  Subjective:    Patient ID: [name], male    DOB: [birth year], 80 y.o.   MRN: [id number]  Chief Complaint  Patient presents with   Hematuria    HPI: Patient is in today for hematuria. Onset was 2-weeks ago. Denies previous treatment. States he is experiencing LLQ abd pain. Denies fever, chills, vomiting,CVA tenderness, or diarrhea. He has a past medical history of kidney stones and prostates cancer. He is followed by Dr Nila Nephew, urologist.  Past Medical History:  Diagnosis Date   Angina pectoris ([place]) 08/02/2019   Arthritis    on meds   Atrial fibrillation ([place])    Backache 08/17/2012   Body mass index (BMI) 26.0-26.9, adult 05/08/2020   Cancer ([place]) 10/2015   prostate cancer treated with radiation   Carpal tunnel syndrome 01/18/2018   Cataract    sx-bilaterally   Cervical spondylosis without myelopathy 09/24/2015   Chest tightness 06/28/2019   Chronic back pain 05/30/2013   Colitis 11/22/2018   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 06/17/2016   Deficiency of other specified B group vitamins 05/20/2019   Depression 04/04/2019   Diabetes mellitus due to underlying condition with unspecified complications ([place]) 8/93/7342   Diabetes mellitus without complication ([place])    Type II   Diarrhea 11/21/2018   Dyslipidemia 06/17/2016   Dyspnea on exertion 06/28/2019   Dysrhythmia    Essential (primary) hypertension 06/17/2016   Essential hypertension 06/17/2016   GERD (gastroesophageal reflux disease)    on meds   Greater trochanteric pain syndrome 07/03/2020   Headache    History of hiatal hernia    History of kidney stones    History of prostate cancer 05/20/2019   Hypercholesteremia 04/04/2019   Hyperlipemia    Hypertension    Impaired fasting blood sugar 05/20/2019   Impingement syndrome of shoulder region 01/12/2018   Low back pain 02/20/2014   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   Lumbar radiculopathy 05/24/2019   Malignant  neoplasm prostate ([place]) 05/20/2019   Medicare annual wellness visit, subsequent 01/16/2020   Mixed hyperlipidemia 08/31/2019   Neck pain 07/03/2015   Paresthesia of hand 01/12/2018   Paresthesia of upper limb 01/12/2018   Paroxysmal atrial fibrillation ([place]) 04/04/2019   PONV (postoperative nausea and vomiting)    S/P cervical spinal fusion 03/04/2018   S/P lumbar fusion 12/09/2019   S/P lumbar spinal fusion 09/29/2013   Sepsis ([place]) 11/22/2018   Shoulder pain 01/12/2018   Spinal 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 01/26/2018   Stenosis of intervertebral foramina 01/12/2018   Trochanteric bursitis of right hip 04/19/2020    Past Surgical History:  Procedure Laterality Date   anal fissures     ANTERIOR CERVICAL DECOMP/DISCECTOMY FUSION N/A 03/04/2018   Procedure: Anterior Cervical Decompression Fusion - Cervical three-Cervical four - Cervical four-Cervical five;  Surgeon: Eustace Moore, MD;  Location: [place];  Service: Neurosurgery;  Laterality: N/A;   ANTERIOR LAT LUMBAR FUSION N/A 05/30/2020   Procedure: Anterior Lateral Lumbar Fusion Lumbar One-Two with Lateral Plate Removal of pedicle screws Lumbar One and Posterior Lateral Fusion Lumbar One-Lumbar Three;  Surgeon: Eustace Moore, MD;  Location: [place];  Service: Neurosurgery;  Laterality: N/A;   BACK SURGERY     CARDIAC CATHETERIZATION     no PCI   CARPAL TUNNEL RELEASE Left 03/04/2018   Procedure: Carpal Tunnel Release - left;  Surgeon: Eustace Moore, MD;  Location: [place];  Service: Neurosurgery;  Laterality: Left;  CHOLECYSTECTOMY     COLONOSCOPY W/ POLYPECTOMY  08/30/2012   Colonic polyp status post polypectomy.  Pancolonic diverticulosis predominantly in the sigmoid colon. Small internal hemorrhoids. Moderate internal hemorhroids.    ESOPHAGOGASTRODUODENOSCOPY  08/04/2016   Schatzkis ring status post esophageal dilatation. Small hiatal hernia. Mild gastritis.    EYE SURGERY     cataract removal bilateral eyes   FOOT SURGERY     left heel    HARDWARE REMOVAL N/A 05/30/2020   Procedure: HARDWARE REMOVAL;  Surgeon: Eustace Moore, MD;  Location: [place];  Service: Neurosurgery;  Laterality: N/A;   HERNIA REPAIR     LAMINECTOMY WITH POSTERIOR LATERAL ARTHRODESIS LEVEL 2 N/A 12/09/2019   Procedure: Laminectomy and Foraminotomy - Lumbar one-two, Lumbar two-three, posterolateral instrumented fusion Lumbar one-three, removal of instrumentation Lumbar three-five;  Surgeon: Eustace Moore, MD;  Location: [place];  Service: Neurosurgery;  Laterality: N/A;   LEFT HEART CATH AND CORONARY ANGIOGRAPHY N/A 08/03/2019   Procedure: LEFT HEART CATH AND CORONARY ANGIOGRAPHY;  Surgeon: Jettie Booze, MD;  Location: [place] [facility];  Service: Cardiovascular;  Laterality: N/A;   SHOULDER SURGERY     bilateral   WISDOM TOOTH EXTRACTION      Family History  Problem Relation Age of Onset   Colon cancer Neg Hx    Esophageal cancer Neg Hx    Rectal cancer Neg Hx    Stomach cancer Neg Hx    Colon polyps Neg Hx     Social History   Socioeconomic History   Marital status: Widowed    Spouse name: Not on file   Number of children: 2   Years of education: Not on file   Highest education level: Not on file  Occupational History   Not on file  Tobacco Use   Smoking status: Former    Packs/day: 0.50    Years: 35.00    Pack years: 17.50    Types: Cigarettes    Quit date: 02/10/1993    Years since quitting: 27.9   Smokeless tobacco: Former    Types: Chew    Quit date: 02/10/2006  Vaping Use   Vaping Use: Never used  Substance and Sexual Activity   Alcohol use: Not Currently    Alcohol/week: 1.0 standard drink    Types: 1 Cans of beer per week    Comment: occ   Drug use: No   Sexual activity: Yes    Partners: Female  Other Topics Concern   Not on file  Social History Narrative   Not on file   Social Determinants of Health   Financial Resource Strain: Not on file  Food Insecurity: Not on file  Transportation Needs: Not on file   Physical Activity: Not on file  Stress: Not on file  Social Connections: Not on file  Intimate Partner Violence: Not on file    Outpatient Medications Prior to Visit  Medication Sig Dispense Refill   amLODipine (NORVASC) 5 MG tablet Take 5 mg by mouth daily.     ASPIRIN LOW DOSE 81 MG EC tablet TAKE 1 TABLET BY MOUTH EVERY DAY 90 tablet 3   atorvastatin (LIPITOR) 40 MG tablet TAKE 1 TABLET EVERY DAY  (NEW DOSE) 90 tablet 1   carboxymethylcellulose (REFRESH PLUS) 0.5 % SOLN Place 1 drop into both eyes 3 (three) times daily as needed for dry eyes (dry/irritated eyes).     cholecalciferol (VITAMIN D) 1000 UNITS tablet Take 1,000 Units by mouth in the morning.  cloNIDine (CATAPRES) 0.1 MG tablet Take 0.1 mg by mouth as needed (Take when sbp 170).     diazepam (VALIUM) 5 MG tablet Take 1 tablet 30-40 minutes prior to MRI.  May repeat at MRI. 2 tablet 0   divalproex (DEPAKOTE ER) 250 MG 24 hr tablet TAKE 1 TABLET AT BEDTIME FOR ONE WEEK, THEN INCREASE TO 2 TABLETS AT BEDTIME 60 tablet 0   gabapentin (NEURONTIN) 600 MG tablet Take 1 tablet (600 mg total) by mouth 3 (three) times daily. 270 tablet 1   HYDROcodone-acetaminophen (NORCO/VICODIN) 5-325 MG tablet Take 1 tablet by mouth every 6 (six) hours as needed.     lisinopril (ZESTRIL) 10 MG tablet TAKE 1 TABLET (10 MG TOTAL) BY MOUTH DAILY. 90 tablet 0   metFORMIN (GLUCOPHAGE) 500 MG tablet Take 500 mg by mouth daily.     nitroGLYCERIN (NITROSTAT) 0.4 MG SL tablet Place 1 tablet (0.4 mg total) under the tongue every 5 (five) minutes x 3 doses as needed for chest pain. 25 tablet 12   nortriptyline (PAMELOR) 10 MG capsule Take 77m at bedtime for one week, then increase to 248mat bedtime. We can increase dose in 4-5 weeks if needed 60 capsule 0   Omega-3 Fatty Acids (FISH OIL) 1200 MG CAPS Take 1,200 mg by mouth in the morning.     ondansetron (ZOFRAN ODT) 4 MG disintegrating tablet Take 1 tablet (4 mg total) by mouth every 8 (eight) hours as  needed for nausea or vomiting. 20 tablet 0   pantoprazole (PROTONIX) 40 MG tablet Take 1 tablet (40 mg total) by mouth daily. 90 tablet 1   tamsulosin (FLOMAX) 0.4 MG CAPS capsule Take 0.4 mg by mouth daily after supper.  1   No facility-administered medications prior to visit.    Allergies  Allergen Reactions   Oxycontin [Oxycodone] Other (See Comments)    loopy    Review of Systems  Constitutional:  Negative for chills and fever.  HENT:  Negative for congestion and sore throat.   Respiratory:  Negative for cough and shortness of breath.   Cardiovascular:  Negative for chest pain.  Gastrointestinal:  Positive for abdominal pain (LLQ). Negative for nausea.  Genitourinary:  Positive for hematuria. Negative for difficulty urinating, dysuria, flank pain, frequency and urgency.  Musculoskeletal:  Negative for back pain.      Objective:    Physical Exam Vitals reviewed. Exam conducted with a chaperone present.  Constitutional:      Appearance: Normal appearance.  Cardiovascular:     Rate and Rhythm: Normal rate and regular rhythm.     Pulses: Normal pulses.     Heart sounds: Normal heart sounds.  Pulmonary:     Effort: Pulmonary effort is normal.     Breath sounds: Normal breath sounds.  Abdominal:     General: Bowel sounds are normal. There is no distension.     Palpations: Abdomen is soft. There is no mass.     Tenderness: There is abdominal tenderness (LLQ). There is no right CVA tenderness, left CVA tenderness, guarding or rebound.     Hernia: No hernia is present.  Musculoskeletal:        General: Normal range of motion.  Skin:    General: Skin is warm and dry.     Capillary Refill: Capillary refill takes less than 2 seconds.  Neurological:     General: No focal deficit present.     Mental Status: He is alert and oriented to person, place, and  time.  Psychiatric:        Mood and Affect: Mood normal.        Behavior: Behavior normal.    BP (!) 154/98   Pulse 94    Temp (!) 97.4 F (36.3 C)   Ht 5' 11"  (1.803 m)   Wt 195 lb (88.5 kg)   SpO2 99%   BMI 27.20 kg/m  Wt Readings from Last 3 Encounters:  01/07/21 195 lb (88.5 kg)  12/31/20 194 lb (88 kg)  12/14/20 196 lb (88.9 kg)    Health Maintenance Due  Topic Date Due   Pneumonia Vaccine 93+ Years old (1 - PCV) Never done   OPHTHALMOLOGY EXAM  Never done   TETANUS/TDAP  Never done   Zoster Vaccines- Shingrix (1 of 2) Never done   COVID-19 Vaccine (3 - Moderna risk series) 08/08/2020   INFLUENZA VACCINE  09/10/2020   HEMOGLOBIN A1C  11/28/2020       Lab Results  Component Value Date   TSH 3.000 08/31/2019   Lab Results  Component Value Date   WBC 5.7 07/11/2020   HGB 14.4 07/11/2020   HCT 43.2 07/11/2020   MCV 92 07/11/2020   PLT 204 07/11/2020   Lab Results  Component Value Date   NA 146 (H) 07/11/2020   K 4.3 07/11/2020   CO2 23 07/11/2020   GLUCOSE 102 (H) 07/11/2020   BUN 9 07/11/2020   CREATININE 0.76 07/11/2020   BILITOT 1.0 07/11/2020   ALKPHOS 119 07/11/2020   AST 17 07/11/2020   ALT 19 07/11/2020   PROT 6.5 07/11/2020   ALBUMIN 4.4 07/11/2020   CALCIUM 9.5 07/11/2020   ANIONGAP 10 05/29/2020   EGFR 90 07/11/2020   Lab Results  Component Value Date   CHOL 102 07/11/2020   Lab Results  Component Value Date   HDL 44 07/11/2020   Lab Results  Component Value Date   LDLCALC 38 07/11/2020   Lab Results  Component Value Date   TRIG 110 07/11/2020   Lab Results  Component Value Date   CHOLHDL 2.3 07/11/2020   Lab Results  Component Value Date   HGBA1C 6.2 (H) 05/29/2020       Assessment & Plan:   1. Hematuria, unspecified type - POCT urinalysis dipstick - CT ABDOMEN PELVIS W WO CONTRAST; Future - CBC with Differential/Platelet - Comprehensive metabolic panel  2. Left groin pain - CT ABDOMEN PELVIS W WO CONTRAST; Future - CBC with Differential/Platelet - Comprehensive metabolic panel  3. History of kidney stones - CT ABDOMEN PELVIS  W WO CONTRAST; Future  4. History of prostate cancer - CT ABDOMEN PELVIS W WO CONTRAST; Future  5. LLQ pain - CT ABDOMEN PELVIS W WO CONTRAST; Future - CBC with Differential/Platelet - Comprehensive metabolic panel    Obtain x-ray at [place] fluids, especially water Take Tylenol as needed for left lower abdominal pain We will call you with labs and appointment for abdominal CT Seek emergency medical care if symptoms become severe        Follow-up: PRN, pending labs and CT results  An After Visit Summary was printed and given to the patient.  I, Rip Harbour, NP, have reviewed all documentation for this visit. The documentation on 01/07/21 for the exam, diagnosis, procedures, and orders are all accurate and complete.    Signed, Rip Harbour, NP [place] [phone]

## 2021-01-07 NOTE — Patient Instructions (Addendum)
Obtain x-ray at [facility] fluids, especially water Take Tylenol as needed for left lower abdominal pain We will call you with labs and appointment for abdominal CT Seek emergency medical care if symptoms become severe                                                                                                                                                                                                                                                                                                                                                                                                                                                                                                                                Proteinuria Proteinuria is when there is too much protein in the urine. Proteins are important for building muscles and bones. Proteins are also needed to fight infections, help the blood to clot, and keep body fluids in balance. Proteinuria may be mild and temporary, or it may be an early sign  of kidney disease. The kidneys make urine. Healthy kidneys also keep substances like proteins from leaving the blood and ending up in the urine. What are the causes? This condition may be caused by damage to the kidneys or by temporary causes such as fever or stress. Proteinuria may happen when the kidneys are not working well. Healthy kidneys have filters (glomeruli) that keep proteins out of the urine. Proteinuria may mean that the glomeruli are damaged. The main causes of this type of damage are: Diabetes. High blood pressure. Other causes of kidney damage can also cause proteinuria, such as: Diseases of the immune system, such as lupus, rheumatoid arthritis, sarcoidosis, and Goodpasture syndrome. Heart disease or heart failure. Kidney infection. Certain cancers, including kidney cancer, lymphoma, leukemia, and multiple myeloma. Amyloidosis. This is a disease that causes  abnormal proteins to build up in body tissues. Reactions to certain medicines, such as NSAIDs. Injuries or poisons (toxins). High blood pressure that occurs during pregnancy (preeclampsia and eclampsia). Temporary proteinuria may result from conditions that put stress on the kidneys. These conditions usually do not cause kidney damage. They include: Fever. Exposure to cold or heat. Emotional or physical stress. Extreme exercise. Standing for long periods of time. What increases the risk? You are more likely to develop this condition if you: Have diabetes. Have high blood pressure. Have heart disease or heart failure. Have an immune disease, cancer, or other disease that affects the kidneys. Have a family history of kidney disease. Are 40 years of age or older. Are overweight. Are of African American, American [place], Hispanic/Latino, or [place] descent. Are pregnant. Have an infection. What are the signs or symptoms? Mild proteinuria may not cause symptoms. As more proteins enter the urine, symptoms of kidney disease may develop, such as: Foamy urine. Swelling of the face, abdomen, hands, legs, or feet (edema). Needing to urinate frequently. Fatigue. Difficulty sleeping. Dry and itchy skin. Nausea and vomiting. Muscle cramps. Shortness of breath. How is this diagnosed? This condition may be diagnosed with a urine test. You may have this test as part of a routine physical exam or because you have symptoms of kidney disease or risk factors for kidney disease. You may also have: Blood tests to measure the level of a certain substance (creatinine) that increases with kidney disease. Imaging tests of your kidney, such as a CT scan or an ultrasound, to look for signs of kidney damage. How is this treated? If your proteinuria is mild or temporary, treatment may not be needed for this condition. Your health care provider may show you how to monitor the level of protein in your  urine at home. Identifying proteinuria early is important so that the cause of the condition can be treated. Treatment for this condition depends on the cause of your proteinuria. Treatment may include: Making diet and lifestyle changes. Getting blood pressure under control. Getting blood sugar under control, if you have diabetes. Managing any other medical conditions you have that affect your kidneys. Giving birth, if you are pregnant. Avoiding medicines that damage your kidneys. In severe cases, kidney disease may need to be treated with medicines or dialysis. Follow these instructions at home: Activity Return to your normal activities as told by your health care provider. Ask your health care provider what activities are safe for you. Ask your health care provider to recommend an exercise program. General instructions Check your protein levels at home if directed by your health care provider. Follow instructions from your health care provider  about eating or drinking restrictions. If you are overweight, ask your health care provider about diets that can help you get to a healthy weight. Take over-the-counter and prescription medicines only as told by your health care provider. Keep all follow-up visits as told by your health care provider. This is important. Contact a health care provider if: You have new symptoms. Your symptoms get worse or do not improve. Get help right away if you: Have back pain. Have diarrhea. Vomit. Have a fever. Have a rash. Summary Proteinuria is when there is too much protein in the urine. Proteinuria may be mild and temporary, or it may be an early sign of kidney disease. This condition may be diagnosed with a urine test. Treatment for this condition depends on the cause of your proteinuria. Treatment may include diet and lifestyle changes, blood pressure and blood sugar management, and avoiding medicines that may damage the kidneys. If the proteinuria  is severe, it may need to be treated with medicines or dialysis. This information is not intended to replace advice given to you by your health care provider. Make sure you discuss any questions you have with your health care provider. Document Revised: 09/18/2020 Document Reviewed: 09/18/2020 Elsevier Patient Education  2022 [place].    Hematuria, Adult Hematuria is blood in the urine. Blood may be visible in the urine, or it may be identified with a test. This condition can be caused by infections of the bladder, urethra, kidney, or prostate. Other possible causes include: Kidney stones. Cancer of the urinary tract. Too much calcium in the urine. Conditions that are passed from parent to child (inherited conditions). Exercise that requires a lot of energy. Infections can usually be treated with medicine, and a kidney stone usually will pass through your urine. If neither of these is the cause of your hematuria, more tests may be needed to identify the cause of your symptoms. It is very important to tell your health care provider about any blood in your urine, even if it is painless or the blood stops without treatment. Blood in the urine, when it happens and then stops and then happens again, can be a symptom of a very serious condition, including cancer. There is no pain in the initial stages of many urinary cancers. Follow these instructions at home: Medicines Take over-the-counter and prescription medicines only as told by your health care provider. If you were prescribed an antibiotic medicine, take it as told by your health care provider. Do not stop taking the antibiotic even if you start to feel better. Eating and drinking Drink enough fluid to keep your urine pale yellow. It is recommended that you drink 3-4 quarts (2.8-3.8 L) a day. If you have been diagnosed with an infection, drinking cranberry juice in addition to large amounts of water is recommended. Avoid caffeine, tea,  and carbonated beverages. These tend to irritate the bladder. Avoid alcohol because it may irritate the prostate (in males). General instructions If you have been diagnosed with a kidney stone, follow your health care provider's instructions about straining your urine to catch the stone. Empty your bladder often. Avoid holding urine for long periods of time. If you are male: After a bowel movement, wipe from front to back and use each piece of toilet paper only once. Empty your bladder before and after sex. Pay attention to any changes in your symptoms. Tell your health care provider about any changes or any new symptoms. It is up to you to get  the results of any tests. Ask your health care provider, or the department that is doing the test, when your results will be ready. Keep all follow-up visits. This is important. Contact a health care provider if: You develop back pain. You have a fever or chills. You have nausea or vomiting. Your symptoms do not improve after 3 days. Your symptoms get worse. Get help right away if: You develop severe vomiting and are unable to take medicine without vomiting. You develop severe pain in your back or abdomen even though you are taking medicine. You pass a large amount of blood in your urine. You pass blood clots in your urine. You feel very weak or like you might faint. You faint. Summary Hematuria is blood in the urine. It has many possible causes. It is very important that you tell your health care provider about any blood in your urine, even if it is painless or the blood stops without treatment. Take over-the-counter and prescription medicines only as told by your health care provider. Drink enough fluid to keep your urine pale yellow. This information is not intended to replace advice given to you by your health care provider. Make sure you discuss any questions you have with your health care provider. Document Revised: 09/28/2019 Document  Reviewed: 09/28/2019 Elsevier Patient Education  2022 [employer].

## 2021-01-07 NOTE — Telephone Encounter (Signed)
Patient requesting to switch from Dr. Geraldo Pitter to Dr. Agustin Cree.

## 2021-01-08 ENCOUNTER — Telehealth: Payer: Self-pay | Admitting: Family Medicine

## 2021-01-08 LAB — CBC WITH DIFFERENTIAL/PLATELET
Basophils Absolute: 0 10*3/uL (ref 0.0–0.2)
Basos: 0 %
EOS (ABSOLUTE): 0.1 10*3/uL (ref 0.0–0.4)
Eos: 2 %
Hematocrit: 45.9 % (ref 37.5–51.0)
Hemoglobin: 15.8 g/dL (ref 13.0–17.7)
Immature Grans (Abs): 0 10*3/uL (ref 0.0–0.1)
Immature Granulocytes: 0 %
Lymphocytes Absolute: 1.4 10*3/uL (ref 0.7–3.1)
Lymphs: 21 %
MCH: 29.7 pg (ref 26.6–33.0)
MCHC: 34.4 g/dL (ref 31.5–35.7)
MCV: 86 fL (ref 79–97)
Monocytes Absolute: 0.7 10*3/uL (ref 0.1–0.9)
Monocytes: 11 %
Neutrophils Absolute: 4.5 10*3/uL (ref 1.4–7.0)
Neutrophils: 66 %
Platelets: 191 10*3/uL (ref 150–450)
RBC: 5.32 x10E6/uL (ref 4.14–5.80)
RDW: 13.1 % (ref 11.6–15.4)
WBC: 6.8 10*3/uL (ref 3.4–10.8)

## 2021-01-08 LAB — COMPREHENSIVE METABOLIC PANEL WITH GFR
ALT: 18 IU/L (ref 0–44)
AST: 20 IU/L (ref 0–40)
Albumin/Globulin Ratio: 2 (ref 1.2–2.2)
Albumin: 4.7 g/dL — ABNORMAL HIGH (ref 3.6–4.6)
Alkaline Phosphatase: 97 IU/L (ref 44–121)
BUN/Creatinine Ratio: 19 (ref 10–24)
BUN: 17 mg/dL (ref 8–27)
Bilirubin Total: 0.8 mg/dL (ref 0.0–1.2)
CO2: 20 mmol/L (ref 20–29)
Calcium: 9.7 mg/dL (ref 8.6–10.2)
Chloride: 104 mmol/L (ref 96–106)
Creatinine, Ser: 0.91 mg/dL (ref 0.76–1.27)
Globulin, Total: 2.4 g/dL (ref 1.5–4.5)
Glucose: 121 mg/dL — ABNORMAL HIGH (ref 70–99)
Potassium: 4.5 mmol/L (ref 3.5–5.2)
Sodium: 140 mmol/L (ref 134–144)
Total Protein: 7.1 g/dL (ref 6.0–8.5)
eGFR: 84 mL/min/1.73

## 2021-01-08 NOTE — Telephone Encounter (Signed)
   [name] has been scheduled for the following appointment:  WHAT: CT ABDOMEN/PELVIS WHERE: [facility] DATE: 1212/22 TIME: 9:30 AM ARRIVAL TIME  Patient has been made aware.

## 2021-01-08 NOTE — Telephone Encounter (Signed)
Rescheduled for 12/6 @ 9:15 am

## 2021-01-15 DIAGNOSIS — Z8546 Personal history of malignant neoplasm of prostate: Secondary | ICD-10-CM | POA: Diagnosis not present

## 2021-01-15 DIAGNOSIS — K769 Liver disease, unspecified: Secondary | ICD-10-CM | POA: Diagnosis not present

## 2021-01-15 DIAGNOSIS — K5730 Diverticulosis of large intestine without perforation or abscess without bleeding: Secondary | ICD-10-CM | POA: Diagnosis not present

## 2021-01-15 DIAGNOSIS — R1032 Left lower quadrant pain: Secondary | ICD-10-CM | POA: Diagnosis not present

## 2021-01-15 DIAGNOSIS — R319 Hematuria, unspecified: Secondary | ICD-10-CM | POA: Diagnosis not present

## 2021-01-15 DIAGNOSIS — N281 Cyst of kidney, acquired: Secondary | ICD-10-CM | POA: Diagnosis not present

## 2021-01-15 DIAGNOSIS — Z87442 Personal history of urinary calculi: Secondary | ICD-10-CM | POA: Diagnosis not present

## 2021-01-15 DIAGNOSIS — N3289 Other specified disorders of bladder: Secondary | ICD-10-CM | POA: Diagnosis not present

## 2021-01-17 ENCOUNTER — Other Ambulatory Visit: Payer: Self-pay

## 2021-01-17 ENCOUNTER — Other Ambulatory Visit: Payer: Self-pay | Admitting: Neurology

## 2021-01-17 ENCOUNTER — Encounter: Payer: Self-pay | Admitting: Nurse Practitioner

## 2021-01-17 ENCOUNTER — Ambulatory Visit (INDEPENDENT_AMBULATORY_CARE_PROVIDER_SITE_OTHER): Payer: Medicare PPO | Admitting: Nurse Practitioner

## 2021-01-17 ENCOUNTER — Other Ambulatory Visit: Payer: Self-pay | Admitting: Nurse Practitioner

## 2021-01-17 VITALS — BP 118/80 | HR 97 | Temp 97.1°F | Ht 71.0 in | Wt 195.0 lb

## 2021-01-17 DIAGNOSIS — C61 Malignant neoplasm of prostate: Secondary | ICD-10-CM | POA: Diagnosis not present

## 2021-01-17 DIAGNOSIS — Z23 Encounter for immunization: Secondary | ICD-10-CM | POA: Diagnosis not present

## 2021-01-17 DIAGNOSIS — R319 Hematuria, unspecified: Secondary | ICD-10-CM

## 2021-01-17 DIAGNOSIS — Z87442 Personal history of urinary calculi: Secondary | ICD-10-CM

## 2021-01-17 DIAGNOSIS — N3091 Cystitis, unspecified with hematuria: Secondary | ICD-10-CM

## 2021-01-17 DIAGNOSIS — I700 Atherosclerosis of aorta: Secondary | ICD-10-CM

## 2021-01-17 DIAGNOSIS — Z0000 Encounter for general adult medical examination without abnormal findings: Secondary | ICD-10-CM

## 2021-01-17 DIAGNOSIS — R310 Gross hematuria: Secondary | ICD-10-CM | POA: Diagnosis not present

## 2021-01-17 DIAGNOSIS — N35819 Other urethral stricture, male, unspecified site: Secondary | ICD-10-CM | POA: Diagnosis not present

## 2021-01-17 DIAGNOSIS — Z8546 Personal history of malignant neoplasm of prostate: Secondary | ICD-10-CM

## 2021-01-17 DIAGNOSIS — R1032 Left lower quadrant pain: Secondary | ICD-10-CM

## 2021-01-17 LAB — POCT URINALYSIS DIPSTICK
Bilirubin, UA: NEGATIVE
Blood, UA: NEGATIVE
Glucose, UA: NEGATIVE
Ketones, UA: NEGATIVE
Leukocytes, UA: NEGATIVE
Nitrite, UA: NEGATIVE
Protein, UA: NEGATIVE
Spec Grav, UA: 1.01 (ref 1.010–1.025)
Urobilinogen, UA: 0.2 E.U./dL
pH, UA: 6.5 (ref 5.0–8.0)

## 2021-01-17 MED ORDER — SULFAMETHOXAZOLE-TRIMETHOPRIM 800-160 MG PO TABS: 1.0000 | ORAL_TABLET | Freq: Two times a day (BID) | ORAL | 0 refills | Status: DC

## 2021-01-17 NOTE — Patient Instructions (Addendum)
We will call you with appointment with appointment with urologist [facility] fluids, especially water Take Bactrim twice daily for 7 days as prescribed Follow-up with Dr Nila Nephew today at 2:15 Next scheduled appointment with Dr Tobie Poet at 7:45, fasting; she may want to see you sooner     Urethral Stricture Urethral stricture is narrowing of the tube (urethra) that carries urine from the bladder out of the body. The urethra can become narrow due to scar tissue from an injury or infection. This can make it difficult to pass urine. In women, the urethra opens above the vaginal opening. In men, the urethra opens at the tip of the penis, and the urethra is much longer than it is in women. Because of the length of the male urethra, urethral stricture is much more common in men. This condition is treated with surgery. What are the causes? In both men and women, common causes of urethral stricture include: Urinary tract infection (UTI). Sexually transmitted infection (STI). Use of a tube placed into the urethra to drain urine from the bladder (urinary catheter). Urinary tract surgery. In men, common causes of urethral stricture include: A severe injury to the pelvis. Prostate surgery. Injury to the penis. In many cases, the cause of urethral stricture is not known. What increases the risk? You are more likely to develop this condition if you: Are male. Men who have had prostate surgery are at risk of developing this condition. Use a urinary catheter. Have had urinary tract surgery. What are the signs or symptoms? The main symptom of this condition is difficulty passing urine. This may cause decreased urine flow, dribbling, or spraying of urine. Other symptom of this condition may include: Frequent UTIs. Blood in the urine. Pain when urinating. Swelling of the penis in men. Inability to pass urine (urinary obstruction). How is this diagnosed? This condition may be diagnosed based on: Your medical  history and a physical exam. Urine tests to check for infection or bleeding. X-rays. Ultrasound. Retrograde urethrogram. This is a type of test in which dye is injected into the urethra and then an X-ray is taken. Urethroscopy. This is when a thin tube with a light and camera on the end (urethroscope) is used to look at the urethra. How is this treated? This condition is treated with surgery. The type of surgery that you have depends on the severity of your condition. You may have: Urethral dilation. In this procedure, the narrow part of the urethra is stretched open (dilated) with dilating instruments or a small balloon. Urethrotomy. In this procedure, a urethroscope is placed into the urethra, and the narrow part of the urethra is cut open with a surgical blade inserted through the urethroscope. Open surgery. In this procedure, an incision is made in the urethra, the narrow part is removed, and the urethra is reconstructed. Follow these instructions at home:  Take over-the-counter and prescription medicines only as told by your health care provider. If you were prescribed an antibiotic medicine, take it as told by your health care provider. Do not stop taking the antibiotic even if you start to feel better. Drink enough fluid to keep your urine pale yellow. Keep all follow-up visits as told by your health care provider. This is important. Contact a health care provider if: You have signs of a urinary tract infection, such as: Frequent urination or passing small amounts of urine frequently. Needing to urinate urgently. Pain or burning with urination. Urine that smells bad or unusual. Cloudy urine. Pain in  the lower abdomen or back. Trouble urinating. Blood in the urine. Vomiting or being less hungry than normal. Diarrhea or abdominal pain. Vaginal discharge, if you are male. Your symptoms are getting worse instead of better. Get help right away if: You cannot pass urine. You have  a fever. You have swelling, bruising, or discoloration of your genital area. This includes the penis, scrotum, and inner thighs for men, and the outer genital organs (vulva) and inner thighs for women. You develop swelling in your legs. You have difficulty breathing. Summary Urethral stricture is narrowing of the tube (urethra) that carries urine from the bladder out of the body. The urethra can become narrow due to scar tissue from an injury or infection. This condition can make it difficult to pass urine. This condition is treated with surgery. The type of surgery that you have depends on the severity of your condition. Contact a health care provider if your symptoms get worse or you have signs of a urinary tract infection. This information is not intended to replace advice given to you by your health care provider. Make sure you discuss any questions you have with your health care provider. Document Revised: 09/09/2017 Document Reviewed: 09/09/2017 Elsevier Patient Education  2022 [place].  Diverticulosis Diverticulosis is a condition that develops when small pouches (diverticula) form in the wall of the large intestine (colon). The colon is where water is absorbed and stool (feces) is formed. The pouches form when the inside layer of the colon pushes through weak spots in the outer layers of the colon. You may have a few pouches or many of them. The pouches usually do not cause problems unless they become inflamed or infected. When this happens, the condition is called diverticulitis. What are the causes? The cause of this condition is not known. What increases the risk? The following factors may make you more likely to develop this condition: Being older than age 54. Your risk for this condition increases with age. Diverticulosis is rare among people younger than age 48. By age 31, many people have it. Eating a low-fiber diet. Having frequent constipation. Being overweight. Not  getting enough exercise. Smoking. Taking over-the-counter pain medicines, like aspirin and ibuprofen. Having a family history of diverticulosis. What are the signs or symptoms? In most people, there are no symptoms of this condition. If you do have symptoms, they may include: Bloating. Cramps in the abdomen. Constipation or diarrhea. Pain in the lower left side of the abdomen. How is this diagnosed? Because diverticulosis usually has no symptoms, it is most often diagnosed during an exam for other colon problems. The condition may be diagnosed by: Using a flexible scope to examine the colon (colonoscopy). Taking an X-ray of the colon after dye has been put into the colon (barium enema). Having a CT scan. How is this treated? You may not need treatment for this condition. Your health care provider may recommend treatment to prevent problems. You may need treatment if you have symptoms or if you previously had diverticulitis. Treatment may include: Eating a high-fiber diet. Taking a fiber supplement. Taking a live bacteria supplement (probiotic). Taking medicine to relax your colon. Follow these instructions at home: Medicines Take over-the-counter and prescription medicines only as told by your health care provider. If told by your health care provider, take a fiber supplement or probiotic. Constipation prevention Your condition may cause constipation. To prevent or treat constipation, you may need to: Drink enough fluid to keep your urine pale yellow. Take  over-the-counter or prescription medicines. Eat foods that are high in fiber, such as beans, whole grains, and fresh fruits and vegetables. Limit foods that are high in fat and processed sugars, such as fried or sweet foods.  General instructions Try not to strain when you have a bowel movement. Keep all follow-up visits as told by your health care provider. This is important. Contact a health care provider if you: Have pain in  your abdomen. Have bloating. Have cramps. Have not had a bowel movement in 3 days. Get help right away if: Your pain gets worse. Your bloating becomes very bad. You have a fever or chills, and your symptoms suddenly get worse. You vomit. You have bowel movements that are bloody or black. You have bleeding from your rectum. Summary Diverticulosis is a condition that develops when small pouches (diverticula) form in the wall of the large intestine (colon). You may have a few pouches or many of them. This condition is most often diagnosed during an exam for other colon problems. Treatment may include increasing the fiber in your diet, taking supplements, or taking medicines. This information is not intended to replace advice given to you by your health care provider. Make sure you discuss any questions you have with your health care provider. Document Revised: 08/26/2018 Document Reviewed: 08/26/2018 Elsevier Patient Education  [place].

## 2021-01-17 NOTE — Progress Notes (Signed)
Subjective:   [name] is a 80 y.o. male who presents for Medicare Annual/Subsequent preventive examination and to discuss abd/pelvis CT results. Pt was seen in office on 01/07/21 for left lower quadrant abd pain and hematuria. He is followed by Dr Nila Nephew, urologist. 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      Cardiac Risk Factors include: advanced age, aortic atherosclerosis     Objective:    Today's Vitals   01/17/21 1059 01/17/21 1102  Pulse: 97   Temp: (!) 97.1 F (36.2 C)   SpO2: 97%   Weight: 195 lb (88.5 kg)   Height: 5\' 11"  (1.803 m)   PainSc:  7    Body mass index is 27.2 kg/m.  Advanced Directives 01/17/2021 11/21/2020 05/30/2020 05/29/2020 01/16/2020 12/14/2019 12/06/2019  Does Patient Have a Medical Advance Directive? Yes Yes Yes Yes No No Yes  Type of Paramedic of [place];Living will Healthcare Power of [place];Living will [place];Living will - - [place]  Does patient want to make changes to medical advance directive? No - Patient declined - - No - Patient declined - No - Patient declined No - Patient declined  Copy of [place] in Chart? No - copy requested - No - copy requested No - copy requested - - No - copy requested  Would patient like information on creating a medical advance directive? - - - - - No - Patient declined -  Pre-existing out of facility DNR order (yellow form or pink MOST form) - - - - - - -    Current Medications (verified) Outpatient Encounter Medications as of 01/17/2021  Medication Sig   amLODipine (NORVASC) 5 MG tablet Take 5 mg by mouth daily.   ASPIRIN LOW DOSE 81 MG EC tablet TAKE 1 TABLET BY MOUTH EVERY DAY   atorvastatin (LIPITOR) 40 MG tablet TAKE 1 TABLET EVERY DAY  (NEW DOSE)   carboxymethylcellulose (REFRESH PLUS) 0.5 % SOLN  Place 1 drop into both eyes 3 (three) times daily as needed for dry eyes (dry/irritated eyes).   cholecalciferol (VITAMIN D) 1000 UNITS tablet Take 1,000 Units by mouth in the morning.   cloNIDine (CATAPRES) 0.1 MG tablet Take 0.1 mg by mouth as needed (Take when sbp 170).   diazepam (VALIUM) 5 MG tablet Take 1 tablet 30-40 minutes prior to MRI.  May repeat at MRI.   divalproex (DEPAKOTE ER) 250 MG 24 hr tablet TAKE 1 TABLET AT BEDTIME FOR ONE WEEK, THEN INCREASE TO 2 TABLETS AT BEDTIME   gabapentin (NEURONTIN) 600 MG tablet Take 1 tablet (600 mg total) by mouth 3 (three) times daily.   HYDROcodone-acetaminophen (NORCO/VICODIN) 5-325 MG tablet Take 1 tablet by mouth every 6 (six) hours as needed.   lisinopril (ZESTRIL) 10 MG tablet TAKE 1 TABLET (10 MG TOTAL) BY MOUTH DAILY.   metFORMIN (GLUCOPHAGE) 500 MG tablet Take 500 mg by mouth daily.   nitroGLYCERIN (NITROSTAT) 0.4 MG SL tablet Place 1 tablet (0.4 mg total) under the tongue every 5 (five) minutes x 3 doses as needed for chest pain.   nortriptyline (PAMELOR) 10 MG capsule Take 10mg  at bedtime for one week, then increase to 20mg  at bedtime. We can increase dose in 4-5 weeks if needed   Omega-3 Fatty Acids (FISH OIL) 1200 MG CAPS Take 1,200 mg by mouth in the morning.   ondansetron (ZOFRAN ODT) 4  MG disintegrating tablet Take 1 tablet (4 mg total) by mouth every 8 (eight) hours as needed for nausea or vomiting.   pantoprazole (PROTONIX) 40 MG tablet Take 1 tablet (40 mg total) by mouth daily.   tamsulosin (FLOMAX) 0.4 MG CAPS capsule Take 0.4 mg by mouth daily after supper.   No facility-administered encounter medications on file as of 01/17/2021.    Allergies (verified) Oxycodone   History: Past Medical History:  Diagnosis Date   Angina pectoris ([place]) 08/02/2019   Arthritis    on meds   Atrial fibrillation ([place])    Backache 08/17/2012   Body mass index (BMI) 26.0-26.9, adult 05/08/2020   Cancer ([place]) 10/2015   prostate cancer treated  with radiation   Carpal tunnel syndrome 01/18/2018   Cataract    sx-bilaterally   Cervical spondylosis without myelopathy 09/24/2015   Chest tightness 06/28/2019   Chronic back pain 05/30/2013   Colitis 11/22/2018   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 06/17/2016   Deficiency of other specified B group vitamins 05/20/2019   Depression 04/04/2019   Diabetes mellitus due to underlying condition with unspecified complications ([place]) 8/75/6433   Diabetes mellitus without complication ([place])    Type II   Diarrhea 11/21/2018   Dyslipidemia 06/17/2016   Dyspnea on exertion 06/28/2019   Dysrhythmia    Essential (primary) hypertension 06/17/2016   Essential hypertension 06/17/2016   GERD (gastroesophageal reflux disease)    on meds   Greater trochanteric pain syndrome 07/03/2020   Headache    History of hiatal hernia    History of kidney stones    History of prostate cancer 05/20/2019   Hypercholesteremia 04/04/2019   Hyperlipemia    Hypertension    Impaired fasting blood sugar 05/20/2019   Impingement syndrome of shoulder region 01/12/2018   Low back pain 02/20/2014   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   Lumbar radiculopathy 05/24/2019   Malignant neoplasm prostate ([place]) 05/20/2019   Medicare annual wellness visit, subsequent 01/16/2020   Mixed hyperlipidemia 08/31/2019   Neck pain 07/03/2015   Paresthesia of hand 01/12/2018   Paresthesia of upper limb 01/12/2018   Paroxysmal atrial fibrillation ([place]) 04/04/2019   PONV (postoperative nausea and vomiting)    S/P cervical spinal fusion 03/04/2018   S/P lumbar fusion 12/09/2019   S/P lumbar spinal fusion 09/29/2013   Sepsis ([place]) 11/22/2018   Shoulder pain 01/12/2018   Spinal 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 01/26/2018   Stenosis of intervertebral foramina 01/12/2018   Trochanteric bursitis of right hip 04/19/2020   Past Surgical History:  Procedure Laterality Date   anal fissures     ANTERIOR CERVICAL DECOMP/DISCECTOMY  FUSION N/A 03/04/2018   Procedure: Anterior Cervical Decompression Fusion - Cervical three-Cervical four - Cervical four-Cervical five;  Surgeon: Eustace Moore, MD;  Location: [facility];  Service: Neurosurgery;  Laterality: N/A;   ANTERIOR LAT LUMBAR FUSION N/A 05/30/2020   Procedure: Anterior Lateral Lumbar Fusion Lumbar One-Two with Lateral Plate Removal of pedicle screws Lumbar One and Posterior Lateral Fusion Lumbar One-Lumbar Three;  Surgeon: Eustace Moore, MD;  Location: [facility];  Service: Neurosurgery;  Laterality: N/A;   BACK SURGERY     CARDIAC CATHETERIZATION     no PCI   CARPAL TUNNEL RELEASE Left 03/04/2018   Procedure: Carpal Tunnel Release - left;  Surgeon: Eustace Moore, MD;  Location: [facility];  Service: Neurosurgery;  Laterality: Left;   CHOLECYSTECTOMY     COLONOSCOPY W/ POLYPECTOMY  08/30/2012   Colonic polyp  status post polypectomy.  Pancolonic diverticulosis predominantly in the sigmoid colon. Small internal hemorrhoids. Moderate internal hemorhroids.    ESOPHAGOGASTRODUODENOSCOPY  08/04/2016   Schatzkis ring status post esophageal dilatation. Small hiatal hernia. Mild gastritis.    EYE SURGERY     cataract removal bilateral eyes   FOOT SURGERY     left heel   HARDWARE REMOVAL N/A 05/30/2020   Procedure: HARDWARE REMOVAL;  Surgeon: Eustace Moore, MD;  Location: [facility];  Service: Neurosurgery;  Laterality: N/A;   HERNIA REPAIR     LAMINECTOMY WITH POSTERIOR LATERAL ARTHRODESIS LEVEL 2 N/A 12/09/2019   Procedure: Laminectomy and Foraminotomy - Lumbar one-two, Lumbar two-three, posterolateral instrumented fusion Lumbar one-three, removal of instrumentation Lumbar three-five;  Surgeon: Eustace Moore, MD;  Location: [facility];  Service: Neurosurgery;  Laterality: N/A;   LEFT HEART CATH AND CORONARY ANGIOGRAPHY N/A 08/03/2019   Procedure: LEFT HEART CATH AND CORONARY ANGIOGRAPHY;  Surgeon: Jettie Booze, MD;  Location: [facility];  Service: Cardiovascular;  Laterality: N/A;    SHOULDER SURGERY     bilateral   WISDOM TOOTH EXTRACTION     Family History  Problem Relation Age of Onset   Colon cancer Neg Hx    Esophageal cancer Neg Hx    Rectal cancer Neg Hx    Stomach cancer Neg Hx    Colon polyps Neg Hx    Social History   Socioeconomic History   Marital status: Widowed    Spouse name: Not on file   Number of children: 2   Years of education: Not on file   Highest education level: Not on file  Occupational History   Not on file  Tobacco Use   Smoking status: Former    Packs/day: 0.50    Years: 35.00    Pack years: 17.50    Types: Cigarettes    Quit date: 02/10/1993    Years since quitting: 27.9   Smokeless tobacco: Former    Types: Chew    Quit date: 02/10/2006  Vaping Use   Vaping Use: Never used  Substance and Sexual Activity   Alcohol use: Not Currently    Alcohol/week: 1.0 standard drink    Types: 1 Cans of beer per week    Comment: occ   Drug use: No   Sexual activity: Yes    Partners: Female  Other Topics Concern   Not on file  Social History Narrative   Not on file   Social Determinants of Health   Financial Resource Strain: Low Risk    Difficulty of Paying Living Expenses: Not hard at all  Food Insecurity: No Food Insecurity   Worried About Charity fundraiser in the Last Year: Never true   [place] in the Last Year: Never true  Transportation Needs: No Transportation Needs   Lack of Transportation (Medical): No   Lack of Transportation (Non-Medical): No  Physical Activity: Inactive   Days of Exercise per Week: 0 days   Minutes of Exercise per Session: 0 min  Stress: No Stress Concern Present   Feeling of Stress : Not at all  Social Connections: Moderately Isolated   Frequency of Communication with Friends and Family: More than three times a week   Frequency of Social Gatherings with Friends and Family: Three times a week   Attends Religious Services: More than 4 times per year   Active Member of Clubs or  Organizations: No   Attends Archivist Meetings: Never  Marital Status: Widowed    Tobacco Counseling Counseling given: No   Clinical Intake:     Pain : 0-10 Pain Score: 7  Pain Type: Other (Comment) Pain Location: Abdomen Pain Orientation: Left Pain Descriptors / Indicators: Dull, Sharp Pain Onset: 1 to 4 weeks ago Pain Frequency: Constant     Nutritional Status: BMI 25 -29 Overweight Nutritional Risks: None Diabetes: No  How often do you need to have someone help you when you read instructions, pamphlets, or other written materials from your doctor or pharmacy?: 1 - Never  Diabetic?no   Interpreter Needed?: No      Activities of Daily Living In your present state of health, do you have any difficulty performing the following activities: 01/17/2021 05/29/2020  Hearing? N Y  Vision? N N  Difficulty concentrating or making decisions? N N  Walking or climbing stairs? N N  Dressing or bathing? N N  Doing errands, shopping? N N  Preparing Food and eating ? N -  Using the Toilet? N -  In the past six months, have you accidently leaked urine? N -  Do you have problems with loss of bowel control? N -  Managing your Medications? N -  Managing your Finances? N -  Housekeeping or managing your Housekeeping? N -  Some recent data might be hidden    Patient Care Team: CoxElnita Maxwell, MD as PCP - General (Family Medicine) Revankar, Reita Cliche, MD as Consulting Physician (Cardiology) Dawley, Theodoro Doing, DO as Consulting Physician Eustace Moore, MD as Consulting Physician (Neurosurgery)  Indicate any recent Medical Services you may have received from other than [facility] providers in the past year (date may be approximate).     Assessment:   This is a routine wellness examination for [place].  Hearing/Vision screen No results found.  Dietary issues and exercise activities discussed: Current Exercise Habits: The patient does not participate in regular exercise at  present   Goals Addressed   Remain healthy   Depression Screen PHQ 2/9 Scores 01/07/2021 09/24/2020 07/11/2020 03/20/2020 01/16/2020 08/31/2019  PHQ - 2 Score 0 0 0 0 0 0    Fall Risk Fall Risk  01/07/2021 11/21/2020 09/24/2020 07/11/2020 04/13/2020  Falls in the past year? 0 0 0 0 0  Number falls in past yr: 0 0 0 0 0  Injury with Fall? 0 0 0 0 0  Risk for fall due to : No Fall Risks - - - -  Follow up Falls evaluation completed - - - -    FALL RISK PREVENTION PERTAINING TO THE HOME:  Any stairs in or around the home? Yes  If so, are there any without handrails? Yes  Home free of loose throw rugs in walkways, pet beds, electrical cords, etc? Yes  Adequate lighting in your home to reduce risk of falls? Yes   ASSISTIVE DEVICES UTILIZED TO PREVENT FALLS:  Life alert? No  Use of a cane, walker or w/c? No  Grab bars in the bathroom? Yes  Shower chair or bench in shower? No  Elevated toilet seat or a handicapped toilet? No   TIMED UP AND GO:  Was the test performed? No .  Length of time to ambulate 10 feet: 10 sec.   Gait steady and fast without use of assistive device  Cognitive Function:     6CIT Screen 01/16/2020  What Year? 0 points  What month? 0 points  What time? 0 points  Count back from 20 0 points  Months  in reverse 0 points  Repeat phrase 0 points  Total Score 0    Immunizations Immunization History  Administered Date(s) Administered   Fluad Quad(high Dose 65+) 12/02/2019   Influenza-Unspecified 12/11/2017, 11/19/2018   Moderna SARS-COV2 Booster Vaccination 07/11/2020   Moderna Sars-Covid-2 Vaccination 03/21/2019, 04/18/2019    TDAP status: Due, Education has been provided regarding the importance of this vaccine. Advised may receive this vaccine at local pharmacy or Health Dept. Aware to provide a copy of the vaccination record if obtained from local pharmacy or Health Dept. Verbalized acceptance and understanding.  Flu Vaccine status: Completed at today's  visit  Pneumococcal vaccine status: Up to date  Covid-19 vaccine status: Completed vaccines  Qualifies for Shingles Vaccine? Yes   Zostavax completed Yes   Shingrix Completed?: No.    Education has been provided regarding the importance of this vaccine. Patient has been advised to call insurance company to determine out of pocket expense if they have not yet received this vaccine. Advised may also receive vaccine at local pharmacy or Health Dept. Verbalized acceptance and understanding.  Screening Tests Health Maintenance  Topic Date Due   Pneumonia Vaccine 37+ Years old (1 - PCV) Never done   OPHTHALMOLOGY EXAM  Never done   TETANUS/TDAP  Never done   Zoster Vaccines- Shingrix (1 of 2) Never done   HEMOGLOBIN A1C  11/28/2020   COVID-19 Vaccine (3 - Moderna risk series) 01/23/2021 (Originally 08/08/2020)   INFLUENZA VACCINE  05/10/2021 (Originally 09/10/2020)   FOOT EXAM  07/11/2021   HPV VACCINES  Aged Out    Health Maintenance  Health Maintenance Due  Topic Date Due   Pneumonia Vaccine 43+ Years old (1 - PCV) Never done   OPHTHALMOLOGY EXAM  Never done   TETANUS/TDAP  Never done   Zoster Vaccines- Shingrix (1 of 2) Never done   HEMOGLOBIN A1C  11/28/2020    Colorectal cancer screening: No longer required.   Lung Cancer Screening: (Low Dose CT Chest recommended if Age 67-80 years, 30 pack-year currently smoking OR have quit w/in 15years.) does not qualify.   Lung Cancer Screening Referral: N/A  Additional Screening:  Hepatitis C Screening: does not qualify  Vision Screening: Recommended annual ophthalmology exams for early detection of glaucoma and other disorders of the eye. Is the patient up to date with their annual eye exam?  Yes  Who is the provider or what is the name of the office in which the patient attends annual eye exams? Dr. Renaldo Fiddler If pt is not established with a provider, would they like to be referred to a provider to establish care? No .   Dental  Screening: Recommended annual dental exams for proper oral hygiene  Community Resource Referral / Chronic Care Management: CRR required this visit?  No   CCM required this visit?  No      Plan:     I have personally reviewed and noted the following in the patient's chart:   Medical and social history Use of alcohol, tobacco or illicit drugs  Current medications and supplements including opioid prescriptions. Patient is currently taking opioid prescriptions. Information provided to patient regarding non-opioid alternatives. Patient advised to discuss non-opioid treatment plan with their provider. Functional ability and status Nutritional status Physical activity Advanced directives List of other physicians Hospitalizations, surgeries, and ER visits in previous 12 months Vitals Screenings to include cognitive, depression, and falls Referrals and appointments  In addition, I have reviewed and discussed with patient certain preventive protocols, quality metrics,  and best practice recommendations. A written personalized care plan for preventive services as well as general preventive health recommendations were provided to patient.        01/17/2021    Signed, Jerrell Belfast, DNP  Nurse Notes:

## 2021-01-21 LAB — URINE CULTURE

## 2021-01-23 ENCOUNTER — Other Ambulatory Visit: Payer: Self-pay | Admitting: Neurology

## 2021-01-25 DIAGNOSIS — C61 Malignant neoplasm of prostate: Secondary | ICD-10-CM | POA: Diagnosis not present

## 2021-01-25 DIAGNOSIS — R310 Gross hematuria: Secondary | ICD-10-CM | POA: Diagnosis not present

## 2021-02-07 DIAGNOSIS — R3989 Other symptoms and signs involving the genitourinary system: Secondary | ICD-10-CM | POA: Diagnosis not present

## 2021-02-07 DIAGNOSIS — N451 Epididymitis: Secondary | ICD-10-CM | POA: Diagnosis not present

## 2021-02-07 DIAGNOSIS — N5089 Other specified disorders of the male genital organs: Secondary | ICD-10-CM | POA: Diagnosis not present

## 2021-02-07 DIAGNOSIS — C61 Malignant neoplasm of prostate: Secondary | ICD-10-CM | POA: Diagnosis not present

## 2021-02-07 DIAGNOSIS — R310 Gross hematuria: Secondary | ICD-10-CM | POA: Diagnosis not present

## 2021-02-14 DIAGNOSIS — D49512 Neoplasm of unspecified behavior of left kidney: Secondary | ICD-10-CM | POA: Diagnosis not present

## 2021-02-14 DIAGNOSIS — Z7984 Long term (current) use of oral hypoglycemic drugs: Secondary | ICD-10-CM | POA: Diagnosis not present

## 2021-02-14 DIAGNOSIS — D090 Carcinoma in situ of bladder: Secondary | ICD-10-CM | POA: Diagnosis not present

## 2021-02-14 DIAGNOSIS — R310 Gross hematuria: Secondary | ICD-10-CM | POA: Diagnosis not present

## 2021-02-14 DIAGNOSIS — C652 Malignant neoplasm of left renal pelvis: Secondary | ICD-10-CM | POA: Diagnosis not present

## 2021-02-14 DIAGNOSIS — Z8546 Personal history of malignant neoplasm of prostate: Secondary | ICD-10-CM | POA: Diagnosis not present

## 2021-02-14 DIAGNOSIS — E119 Type 2 diabetes mellitus without complications: Secondary | ICD-10-CM | POA: Diagnosis not present

## 2021-02-14 DIAGNOSIS — Z923 Personal history of irradiation: Secondary | ICD-10-CM | POA: Diagnosis not present

## 2021-02-14 DIAGNOSIS — N3289 Other specified disorders of bladder: Secondary | ICD-10-CM | POA: Diagnosis not present

## 2021-02-14 DIAGNOSIS — I10 Essential (primary) hypertension: Secondary | ICD-10-CM | POA: Diagnosis not present

## 2021-02-14 DIAGNOSIS — C672 Malignant neoplasm of lateral wall of bladder: Secondary | ICD-10-CM | POA: Diagnosis not present

## 2021-02-14 DIAGNOSIS — C659 Malignant neoplasm of unspecified renal pelvis: Secondary | ICD-10-CM | POA: Diagnosis not present

## 2021-02-15 ENCOUNTER — Other Ambulatory Visit: Payer: Self-pay | Admitting: Family Medicine

## 2021-02-15 DIAGNOSIS — N3289 Other specified disorders of bladder: Secondary | ICD-10-CM | POA: Diagnosis not present

## 2021-02-15 DIAGNOSIS — R1032 Left lower quadrant pain: Secondary | ICD-10-CM | POA: Diagnosis not present

## 2021-02-20 ENCOUNTER — Ambulatory Visit: Payer: Medicare PPO | Admitting: Neurology

## 2021-02-22 DIAGNOSIS — R1032 Left lower quadrant pain: Secondary | ICD-10-CM | POA: Diagnosis not present

## 2021-02-22 DIAGNOSIS — N3289 Other specified disorders of bladder: Secondary | ICD-10-CM | POA: Diagnosis not present

## 2021-02-25 ENCOUNTER — Ambulatory Visit: Payer: Medicare PPO | Admitting: Cardiology

## 2021-02-26 ENCOUNTER — Telehealth: Payer: Self-pay | Admitting: Gastroenterology

## 2021-02-26 NOTE — Telephone Encounter (Signed)
Colonoscopy and Path report faxed to [facility]: [phone]

## 2021-02-26 NOTE — Telephone Encounter (Signed)
[facility] is wanting patients last coloscopy and Path report faxed over to them. Please advise.  [phone]

## 2021-03-05 DIAGNOSIS — Z981 Arthrodesis status: Secondary | ICD-10-CM | POA: Diagnosis not present

## 2021-03-08 DIAGNOSIS — C652 Malignant neoplasm of left renal pelvis: Secondary | ICD-10-CM | POA: Diagnosis not present

## 2021-03-08 DIAGNOSIS — C672 Malignant neoplasm of lateral wall of bladder: Secondary | ICD-10-CM | POA: Diagnosis not present

## 2021-03-11 DIAGNOSIS — D4112 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: Secondary | ICD-10-CM | POA: Diagnosis not present

## 2021-03-11 DIAGNOSIS — C678 Malignant neoplasm of overlapping sites of bladder: Secondary | ICD-10-CM | POA: Insufficient documentation

## 2021-03-11 DIAGNOSIS — R8290 Unspecified abnormal findings in urine: Secondary | ICD-10-CM | POA: Diagnosis not present

## 2021-03-13 DIAGNOSIS — C679 Malignant neoplasm of bladder, unspecified: Secondary | ICD-10-CM | POA: Diagnosis not present

## 2021-03-19 DIAGNOSIS — I10 Essential (primary) hypertension: Secondary | ICD-10-CM | POA: Diagnosis not present

## 2021-03-19 DIAGNOSIS — N289 Disorder of kidney and ureter, unspecified: Secondary | ICD-10-CM | POA: Diagnosis not present

## 2021-03-19 DIAGNOSIS — C679 Malignant neoplasm of bladder, unspecified: Secondary | ICD-10-CM | POA: Diagnosis not present

## 2021-03-19 DIAGNOSIS — I480 Paroxysmal atrial fibrillation: Secondary | ICD-10-CM | POA: Diagnosis not present

## 2021-03-19 DIAGNOSIS — C678 Malignant neoplasm of overlapping sites of bladder: Secondary | ICD-10-CM | POA: Diagnosis not present

## 2021-03-19 DIAGNOSIS — K219 Gastro-esophageal reflux disease without esophagitis: Secondary | ICD-10-CM | POA: Diagnosis not present

## 2021-03-19 DIAGNOSIS — T8389XA Other specified complication of genitourinary prosthetic devices, implants and grafts, initial encounter: Secondary | ICD-10-CM | POA: Diagnosis not present

## 2021-03-19 DIAGNOSIS — N3020 Other chronic cystitis without hematuria: Secondary | ICD-10-CM | POA: Diagnosis not present

## 2021-03-19 DIAGNOSIS — N2889 Other specified disorders of kidney and ureter: Secondary | ICD-10-CM | POA: Diagnosis not present

## 2021-03-19 DIAGNOSIS — N400 Enlarged prostate without lower urinary tract symptoms: Secondary | ICD-10-CM | POA: Diagnosis not present

## 2021-03-19 DIAGNOSIS — Z79899 Other long term (current) drug therapy: Secondary | ICD-10-CM | POA: Diagnosis not present

## 2021-03-19 DIAGNOSIS — E119 Type 2 diabetes mellitus without complications: Secondary | ICD-10-CM | POA: Diagnosis not present

## 2021-03-19 DIAGNOSIS — E7800 Pure hypercholesterolemia, unspecified: Secondary | ICD-10-CM | POA: Diagnosis not present

## 2021-03-26 NOTE — Progress Notes (Signed)
Subjective:  Patient ID: [name], male    DOB: Oct 08, 1940  Age: 81 y.o. MRN: [id number]  Chief Complaint  Patient presents with   Hyperlipidemia   Diabetes    PreDiabetes:  Most recent A1C: 6.2. Current medications: Metformin 500mg  once daily.  Hyperlipidemia: Current medications: Atorvastatin 40mg  take 1 tablet daily, OTC Fish oil 1200mg  take 1 capsule daily.  Hypertensive heart disease with chronic diastolic congestive heart failure: Complications: Current medications: Lisinopril 10mg  take 1 tablet daily, Amlodipine 5mg  take 1 tablet daily.  GERD: Current medications: Pantoprazole 40mg  take 1 tablet daily.  Bladder Cancer: Dr. Nila Nephew did initially biopsy positive for bladder cancer. Referred to [facility] and saw Dr. Maury Dus. Repeat cystoscopy with biopsy and stent placed. Returning in next couple of weeks to discuss treatment plan.   BPH: Current medications: Flomax 0.4mg  take 1 capsule daily.  Diet:healthy  Exercise: no   Current Outpatient Medications on File Prior to Visit  Medication Sig Dispense Refill   amLODipine (NORVASC) 5 MG tablet Take 5 mg by mouth daily.     ASPIRIN LOW DOSE 81 MG EC tablet TAKE 1 TABLET BY MOUTH EVERY DAY 90 tablet 3   atorvastatin (LIPITOR) 40 MG tablet TAKE 1 TABLET EVERY DAY  (NEW DOSE) 90 tablet 1   carboxymethylcellulose (REFRESH PLUS) 0.5 % SOLN Place 1 drop into both eyes 3 (three) times daily as needed for dry eyes (dry/irritated eyes).     cholecalciferol (VITAMIN D) 1000 UNITS tablet Take 1,000 Units by mouth in the morning.     cloNIDine (CATAPRES) 0.1 MG tablet Take 0.1 mg by mouth as needed (Take when sbp 170).     lisinopril (ZESTRIL) 10 MG tablet TAKE 1 TABLET (10 MG TOTAL) BY MOUTH DAILY. 90 tablet 0   metFORMIN (GLUCOPHAGE) 500 MG tablet Take 500 mg by mouth daily.     nitroGLYCERIN (NITROSTAT) 0.4 MG SL tablet Place 1 tablet (0.4 mg total) under the tongue every 5 (five) minutes x 3 doses as needed for chest pain. 25  tablet 12   Omega-3 Fatty Acids (FISH OIL) 1200 MG CAPS Take 1,200 mg by mouth in the morning.     pantoprazole (PROTONIX) 40 MG tablet Take 1 tablet (40 mg total) by mouth daily. 90 tablet 1   tamsulosin (FLOMAX) 0.4 MG CAPS capsule Take 0.4 mg by mouth daily after supper.  1   No current facility-administered medications on file prior to visit.   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 06/17/2016   Deficiency of other specified B group vitamins 05/20/2019   Depression 04/04/2019   Diabetes mellitus due to underlying condition with unspecified complications ([place]) 7/56/4332   Diabetes mellitus without complication ([place])    Type II   Diarrhea 11/21/2018   Dyslipidemia 06/17/2016   Dyspnea on exertion 06/28/2019   Dysrhythmia    Essential (primary) hypertension 06/17/2016   Essential hypertension 06/17/2016   GERD (gastroesophageal  reflux disease)    on meds   Greater trochanteric pain syndrome 07/03/2020   Headache    History of hiatal hernia    History of kidney stones    History of prostate cancer 05/20/2019   Hypercholesteremia 04/04/2019   Hyperlipemia    Hypertension    Impaired fasting blood sugar 05/20/2019   Impingement syndrome of shoulder region 01/12/2018   Impingement syndrome of shoulder region 01/12/2018   Low back pain 02/20/2014   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   Lumbar radiculopathy 05/24/2019   Malignant neoplasm prostate ([place]) 05/20/2019    Medicare annual wellness visit, subsequent 01/16/2020   Mixed hyperlipidemia 08/31/2019   Neck pain 07/03/2015   Paresthesia of hand 01/12/2018   Paresthesia of upper limb 01/12/2018   Paroxysmal atrial fibrillation ([place]) 04/04/2019   PONV (postoperative nausea and vomiting)    S/P cervical spinal fusion 03/04/2018   S/P lumbar fusion 12/09/2019   S/P lumbar spinal fusion 09/29/2013   S/P lumbar spinal fusion 09/29/2013   Sepsis ([place]) 11/22/2018   Shoulder pain 01/12/2018   Spinal 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 01/26/2018   Stenosis of intervertebral foramina 01/12/2018   Trochanteric bursitis of right hip 04/19/2020   Past Surgical History:  Procedure Laterality Date   anal fissures     ANTERIOR CERVICAL DECOMP/DISCECTOMY FUSION N/A 03/04/2018   Procedure: Anterior Cervical Decompression Fusion - Cervical three-Cervical four - Cervical four-Cervical five;  Surgeon: Eustace Moore, MD;  Location: [place];  Service: Neurosurgery;  Laterality: N/A;   ANTERIOR LAT LUMBAR FUSION N/A 05/30/2020   Procedure: Anterior Lateral Lumbar Fusion Lumbar One-Two with Lateral Plate Removal of pedicle screws Lumbar One and Posterior Lateral Fusion Lumbar One-Lumbar Three;  Surgeon: Eustace Moore, MD;  Location: [place];  Service: Neurosurgery;  Laterality: N/A;   BACK SURGERY     CARDIAC CATHETERIZATION     no PCI   CARPAL TUNNEL RELEASE Left 03/04/2018   Procedure: Carpal Tunnel Release - left;  Surgeon: Eustace Moore, MD;  Location: [place];  Service: Neurosurgery;  Laterality: Left;   CHOLECYSTECTOMY     COLONOSCOPY W/ POLYPECTOMY  08/30/2012   Colonic polyp status post polypectomy.  Pancolonic diverticulosis predominantly in the sigmoid colon. Small internal hemorrhoids. Moderate internal hemorhroids.    ESOPHAGOGASTRODUODENOSCOPY  08/04/2016   Schatzkis ring status post esophageal dilatation. Small hiatal hernia. Mild gastritis.    EYE SURGERY     cataract removal bilateral eyes   FOOT SURGERY     left heel    HARDWARE REMOVAL N/A 05/30/2020   Procedure: HARDWARE REMOVAL;  Surgeon: Eustace Moore, MD;  Location: [place];  Service: Neurosurgery;  Laterality: N/A;   HERNIA REPAIR     LAMINECTOMY WITH POSTERIOR LATERAL ARTHRODESIS LEVEL 2 N/A 12/09/2019   Procedure: Laminectomy and Foraminotomy - Lumbar one-two, Lumbar two-three, posterolateral instrumented fusion Lumbar one-three, removal of instrumentation Lumbar three-five;  Surgeon: Eustace Moore, MD;  Location: [place];  Service: Neurosurgery;  Laterality: N/A;   LEFT HEART CATH AND CORONARY ANGIOGRAPHY N/A 08/03/2019   Procedure: LEFT HEART CATH AND CORONARY ANGIOGRAPHY;  Surgeon: Jettie Booze, MD;  Location: [place] [facility];  Service: Cardiovascular;  Laterality: N/A;   SHOULDER SURGERY     bilateral   WISDOM TOOTH EXTRACTION      Family History  Problem Relation Age of Onset   Colon cancer Neg Hx    Esophageal cancer Neg Hx    Rectal cancer Neg Hx    Stomach cancer  Neg Hx    Colon polyps Neg Hx    Social History   Socioeconomic History   Marital status: Widowed    Spouse name: Not on file   Number of children: 2   Years of education: Not on file   Highest education level: Not on file  Occupational History   Not on file  Tobacco Use   Smoking status: Former    Packs/day: 0.50    Years: 35.00    Pack years: 17.50    Types: Cigarettes    Quit date: 02/10/1993    Years since quitting: 28.1   Smokeless tobacco: Former    Types: Chew    Quit date: 02/10/2006  Vaping Use   Vaping Use: Never used  Substance and Sexual Activity   Alcohol use: Not Currently    Alcohol/week: 1.0 standard drink    Types: 1 Cans of beer per week    Comment: occ   Drug use: No   Sexual activity: Yes    Partners: Female  Other Topics Concern   Not on file  Social History Narrative   Not on file   Social Determinants of Health   Financial Resource Strain: Low Risk    Difficulty of Paying Living Expenses: Not hard at all  Food Insecurity:  No Food Insecurity   Worried About Charity fundraiser in the Last Year: Never true   [place] in the Last Year: Never true  Transportation Needs: No Transportation Needs   Lack of Transportation (Medical): No   Lack of Transportation (Non-Medical): No  Physical Activity: Inactive   Days of Exercise per Week: 0 days   Minutes of Exercise per Session: 0 min  Stress: No Stress Concern Present   Feeling of Stress : Not at all  Social Connections: Moderately Isolated   Frequency of Communication with Friends and Family: More than three times a week   Frequency of Social Gatherings with Friends and Family: Three times a week   Attends Religious Services: More than 4 times per year   Active Member of Clubs or Organizations: No   Attends Archivist Meetings: Never   Marital Status: Widowed    Review of Systems  Constitutional:  Negative for appetite change, chills, fatigue and fever.  HENT:  Negative for congestion, ear pain and sore throat.   Respiratory:  Negative for cough and shortness of breath.   Cardiovascular:  Negative for chest pain and leg swelling.  Gastrointestinal:  Negative for abdominal pain, constipation, diarrhea, nausea and vomiting.  Genitourinary:  Negative for dysuria and frequency.  Musculoskeletal:  Positive for back pain. Negative for arthralgias and myalgias.  Neurological:  Negative for dizziness and headaches.  Psychiatric/Behavioral:  Negative for dysphoric mood. The patient is not nervous/anxious.     Objective:  BP 110/70    Pulse (!) 56    Temp (!) 97.2 F (36.2 C)    Resp 16    Ht 5' 9.5" (1.765 m)    Wt 194 lb (88 kg)    SpO2 97%    BMI 28.24 kg/m   BP/Weight 03/27/2021 01/17/2021 37/90/2409  Systolic BP 735 329 924  Diastolic BP 70 80 98  Wt. (Lbs) 194 195 195  BMI 28.24 27.2 27.2    Physical Exam Vitals reviewed.  Constitutional:      Appearance: Normal appearance. He is normal weight.  Cardiovascular:     Rate and Rhythm:  Normal rate and regular rhythm.  Pulses: Normal pulses.     Heart sounds: Normal heart sounds.  Pulmonary:     Breath sounds: Normal breath sounds.  Abdominal:     General: Abdomen is flat. Bowel sounds are normal.     Palpations: Abdomen is soft.  Neurological:     Mental Status: He is alert and oriented to person, place, and time.  Psychiatric:        Mood and Affect: Mood normal.        Behavior: Behavior normal.   Diabetic Foot Exam - Simple   No data filed     Lab Results  Component Value Date   WBC 6.8 01/07/2021   HGB 15.8 01/07/2021   HCT 45.9 01/07/2021   PLT 191 01/07/2021   GLUCOSE 121 (H) 01/07/2021   CHOL 102 07/11/2020   TRIG 110 07/11/2020   HDL 44 07/11/2020   LDLCALC 38 07/11/2020   ALT 18 01/07/2021   AST 20 01/07/2021   NA 140 01/07/2021   K 4.5 01/07/2021   CL 104 01/07/2021   CREATININE 0.91 01/07/2021   BUN 17 01/07/2021   CO2 20 01/07/2021   TSH 3.000 08/31/2019   INR 0.9 05/29/2020   HGBA1C 6.2 (H) 05/29/2020   MICROALBUR 30 07/11/2020      Assessment & Plan:   Problem List Items Addressed This Visit       Cardiovascular and Mediastinum   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       RESOLVED: Hypertension   Paroxysmal atrial fibrillation (HCC)    Resolved.        Hypertensive heart disease with chronic diastolic congestive heart failure ([place]) - Primary    Well controlled.  No changes to medicines.  Continue to work on eating a healthy diet and exercise.  Labs drawn today.        Relevant Orders   CBC With Diff/Platelet   Comprehensive metabolic panel     Digestive   GERD (gastroesophageal reflux disease)    Continue protonix 40 mg daily.         Endocrine   Impaired fasting blood sugar    Recommend continue to work on eating healthy diet and exercise. Continue metformin.      Relevant Orders   Hemoglobin A1c      Genitourinary   Malignant neoplasm of overlapping sites of bladder [facility])    Follow up with urology at [facility]         Other   Mixed hyperlipidemia    Well controlled.  No changes to medicines.  Continue to work on eating a healthy diet and exercise.  Labs drawn today.        Relevant Orders   Lipid panel  No orders of the defined types were placed in this encounter.  Orders Placed This Encounter  Procedures   CBC With Diff/Platelet   Comprehensive metabolic panel   Lipid panel   Hemoglobin A1c    Total time spent on today's visit was greater than 30 minutes, including both face-to-face time and nonface-to-face time personally spent on review of chart (labs and imaging), discussing labs and goals, discussing further work-up, treatment options, reviewing outside records of pertinent, answering patient's questions, and coordinating care.  Follow-up: Return in about 6 months (around 09/24/2021) for chronic fasting.  An After Visit Summary was printed and given to the patient.  Rochel Brome, MD [facility] [phone]

## 2021-03-27 ENCOUNTER — Ambulatory Visit: Payer: Medicare PPO | Admitting: Family Medicine

## 2021-03-27 ENCOUNTER — Other Ambulatory Visit: Payer: Self-pay

## 2021-03-27 ENCOUNTER — Ambulatory Visit (INDEPENDENT_AMBULATORY_CARE_PROVIDER_SITE_OTHER): Payer: Medicare PPO

## 2021-03-27 ENCOUNTER — Encounter: Payer: Self-pay | Admitting: Family Medicine

## 2021-03-27 VITALS — BP 110/70 | HR 56 | Temp 97.2°F | Resp 16 | Ht 69.5 in | Wt 194.0 lb

## 2021-03-27 DIAGNOSIS — I10 Essential (primary) hypertension: Secondary | ICD-10-CM

## 2021-03-27 DIAGNOSIS — E782 Mixed hyperlipidemia: Secondary | ICD-10-CM

## 2021-03-27 DIAGNOSIS — I2510 Atherosclerotic heart disease of native coronary artery without angina pectoris: Secondary | ICD-10-CM

## 2021-03-27 DIAGNOSIS — I5032 Chronic diastolic (congestive) heart failure: Secondary | ICD-10-CM | POA: Diagnosis not present

## 2021-03-27 DIAGNOSIS — E1169 Type 2 diabetes mellitus with other specified complication: Secondary | ICD-10-CM | POA: Diagnosis not present

## 2021-03-27 DIAGNOSIS — C678 Malignant neoplasm of overlapping sites of bladder: Secondary | ICD-10-CM | POA: Diagnosis not present

## 2021-03-27 DIAGNOSIS — Z23 Encounter for immunization: Secondary | ICD-10-CM

## 2021-03-27 DIAGNOSIS — I110 Hypertensive heart disease with heart failure: Secondary | ICD-10-CM | POA: Diagnosis not present

## 2021-03-27 DIAGNOSIS — I480 Paroxysmal atrial fibrillation: Secondary | ICD-10-CM

## 2021-03-27 DIAGNOSIS — R7301 Impaired fasting glucose: Secondary | ICD-10-CM

## 2021-03-27 DIAGNOSIS — K219 Gastro-esophageal reflux disease without esophagitis: Secondary | ICD-10-CM

## 2021-03-27 NOTE — Assessment & Plan Note (Signed)
Continue protonix 40mg daily.  

## 2021-03-27 NOTE — Assessment & Plan Note (Signed)
Recommend continue to work on eating healthy diet and exercise. Continue metformin.

## 2021-03-27 NOTE — Assessment & Plan Note (Addendum)
Resolved

## 2021-03-27 NOTE — Assessment & Plan Note (Signed)
Well controlled.  ?No changes to medicines.  ?Continue to work on eating a healthy diet and exercise.  ?Labs drawn today.  ?

## 2021-03-27 NOTE — Assessment & Plan Note (Signed)
Follow up with urology at [facility]

## 2021-03-27 NOTE — Assessment & Plan Note (Signed)
Non obstructive coronary artery disease.

## 2021-03-28 LAB — COMPREHENSIVE METABOLIC PANEL
ALT: 18 IU/L (ref 0–44)
AST: 18 IU/L (ref 0–40)
Albumin/Globulin Ratio: 1.9 (ref 1.2–2.2)
Albumin: 4.7 g/dL — ABNORMAL HIGH (ref 3.6–4.6)
Alkaline Phosphatase: 91 IU/L (ref 44–121)
BUN/Creatinine Ratio: 16 (ref 10–24)
BUN: 14 mg/dL (ref 8–27)
Bilirubin Total: 0.8 mg/dL (ref 0.0–1.2)
CO2: 23 mmol/L (ref 20–29)
Calcium: 9.9 mg/dL (ref 8.6–10.2)
Chloride: 103 mmol/L (ref 96–106)
Creatinine, Ser: 0.89 mg/dL (ref 0.76–1.27)
Globulin, Total: 2.5 g/dL (ref 1.5–4.5)
Glucose: 104 mg/dL — ABNORMAL HIGH (ref 70–99)
Potassium: 4.8 mmol/L (ref 3.5–5.2)
Sodium: 141 mmol/L (ref 134–144)
Total Protein: 7.2 g/dL (ref 6.0–8.5)
eGFR: 86 mL/min/{1.73_m2} (ref >59)

## 2021-03-28 LAB — CARDIOVASCULAR RISK ASSESSMENT

## 2021-03-28 LAB — CBC WITH DIFF/PLATELET
Basophils Absolute: 0 10*3/uL (ref 0.0–0.2)
Basos: 0 %
EOS (ABSOLUTE): 0.1 10*3/uL (ref 0.0–0.4)
Eos: 2 %
Hematocrit: 47.2 % (ref 37.5–51.0)
Hemoglobin: 15.9 g/dL (ref 13.0–17.7)
Immature Grans (Abs): 0 10*3/uL (ref 0.0–0.1)
Immature Granulocytes: 0 %
Lymphocytes Absolute: 1.3 10*3/uL (ref 0.7–3.1)
Lymphs: 22 %
MCH: 29.3 pg (ref 26.6–33.0)
MCHC: 33.7 g/dL (ref 31.5–35.7)
MCV: 87 fL (ref 79–97)
Monocytes Absolute: 0.7 10*3/uL (ref 0.1–0.9)
Monocytes: 12 %
Neutrophils Absolute: 3.8 10*3/uL (ref 1.4–7.0)
Neutrophils: 64 %
Platelets: 193 10*3/uL (ref 150–450)
RBC: 5.43 x10E6/uL (ref 4.14–5.80)
RDW: 12.9 % (ref 11.6–15.4)
WBC: 5.9 10*3/uL (ref 3.4–10.8)

## 2021-03-28 LAB — LIPID PANEL
Chol/HDL Ratio: 3.2 ratio (ref 0.0–5.0)
Cholesterol, Total: 146 mg/dL (ref 100–199)
HDL: 45 mg/dL (ref >39)
LDL Chol Calc (NIH): 75 mg/dL (ref 0–99)
Triglycerides: 148 mg/dL (ref 0–149)
VLDL Cholesterol Cal: 26 mg/dL (ref 5–40)

## 2021-03-28 LAB — HEMOGLOBIN A1C
Est. average glucose Bld gHb Est-mCnc: 134 mg/dL
Hgb A1c MFr Bld: 6.3 % — ABNORMAL HIGH (ref 4.8–5.6)

## 2021-04-12 DIAGNOSIS — D4112 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: Secondary | ICD-10-CM | POA: Diagnosis not present

## 2021-04-12 DIAGNOSIS — C678 Malignant neoplasm of overlapping sites of bladder: Secondary | ICD-10-CM | POA: Diagnosis not present

## 2021-04-19 DIAGNOSIS — K219 Gastro-esophageal reflux disease without esophagitis: Secondary | ICD-10-CM | POA: Diagnosis not present

## 2021-04-19 DIAGNOSIS — E785 Hyperlipidemia, unspecified: Secondary | ICD-10-CM | POA: Diagnosis not present

## 2021-04-19 DIAGNOSIS — Z87891 Personal history of nicotine dependence: Secondary | ICD-10-CM | POA: Diagnosis not present

## 2021-04-19 DIAGNOSIS — N400 Enlarged prostate without lower urinary tract symptoms: Secondary | ICD-10-CM | POA: Diagnosis not present

## 2021-04-19 DIAGNOSIS — Z7984 Long term (current) use of oral hypoglycemic drugs: Secondary | ICD-10-CM | POA: Diagnosis not present

## 2021-04-19 DIAGNOSIS — R030 Elevated blood-pressure reading, without diagnosis of hypertension: Secondary | ICD-10-CM | POA: Diagnosis not present

## 2021-04-19 DIAGNOSIS — N3941 Urge incontinence: Secondary | ICD-10-CM | POA: Diagnosis not present

## 2021-04-19 DIAGNOSIS — Z7982 Long term (current) use of aspirin: Secondary | ICD-10-CM | POA: Diagnosis not present

## 2021-04-19 DIAGNOSIS — R7303 Prediabetes: Secondary | ICD-10-CM | POA: Diagnosis not present

## 2021-05-01 DIAGNOSIS — C678 Malignant neoplasm of overlapping sites of bladder: Secondary | ICD-10-CM | POA: Diagnosis not present

## 2021-05-01 DIAGNOSIS — R140 Abdominal distension (gaseous): Secondary | ICD-10-CM | POA: Diagnosis not present

## 2021-05-01 DIAGNOSIS — R8290 Unspecified abnormal findings in urine: Secondary | ICD-10-CM | POA: Diagnosis not present

## 2021-05-01 DIAGNOSIS — R300 Dysuria: Secondary | ICD-10-CM | POA: Diagnosis not present

## 2021-05-01 DIAGNOSIS — Z960 Presence of urogenital implants: Secondary | ICD-10-CM | POA: Diagnosis not present

## 2021-05-01 DIAGNOSIS — R319 Hematuria, unspecified: Secondary | ICD-10-CM | POA: Diagnosis not present

## 2021-05-08 DIAGNOSIS — C678 Malignant neoplasm of overlapping sites of bladder: Secondary | ICD-10-CM | POA: Diagnosis not present

## 2021-05-15 DIAGNOSIS — C678 Malignant neoplasm of overlapping sites of bladder: Secondary | ICD-10-CM | POA: Diagnosis not present

## 2021-05-15 DIAGNOSIS — Z9359 Other cystostomy status: Secondary | ICD-10-CM | POA: Diagnosis not present

## 2021-05-20 ENCOUNTER — Ambulatory Visit: Payer: Medicare PPO | Admitting: Cardiology

## 2021-05-20 ENCOUNTER — Encounter: Payer: Self-pay | Admitting: Cardiology

## 2021-05-20 VITALS — BP 110/60 | HR 81 | Ht 71.0 in | Wt 190.6 lb

## 2021-05-20 DIAGNOSIS — E785 Hyperlipidemia, unspecified: Secondary | ICD-10-CM

## 2021-05-20 DIAGNOSIS — I110 Hypertensive heart disease with heart failure: Secondary | ICD-10-CM | POA: Diagnosis not present

## 2021-05-20 DIAGNOSIS — I5032 Chronic diastolic (congestive) heart failure: Secondary | ICD-10-CM | POA: Diagnosis not present

## 2021-05-20 DIAGNOSIS — Z6826 Body mass index (BMI) 26.0-26.9, adult: Secondary | ICD-10-CM | POA: Diagnosis not present

## 2021-05-20 DIAGNOSIS — E782 Mixed hyperlipidemia: Secondary | ICD-10-CM | POA: Diagnosis not present

## 2021-05-20 DIAGNOSIS — I2510 Atherosclerotic heart disease of native coronary artery without angina pectoris: Secondary | ICD-10-CM | POA: Diagnosis not present

## 2021-05-20 NOTE — Patient Instructions (Signed)

## 2021-05-20 NOTE — Progress Notes (Signed)
?Cardiology Office Note:   ? ?Date:  05/20/2021  ? ?ID:  [name], DOB 1940-02-12, MRN [id number] ? ?PCP:  Rochel Brome, MD  ?Cardiologist:  Jenne Campus, MD   ? ?Referring MD: Rochel Brome, MD  ? ?Chief Complaint  ?Patient presents with  ? Follow-up  ? ? ?History of Present Illness:   ? ?[name] is a 81 y.o. male with past medical history significant for coronary artery disease, in June 2021 he had cardiac catheterization done which showed 30% stenosis of circumflex, 40% stenosis of proximal to mid LAD, ejection fraction was normal at that time.  He was also told to have aortic aneurysm after echocardiogram showing diameter 40 mm after that CT has been performed which showed normal size of the aorta.  He also have dyslipidemia.  I do see diagnosis of atrial fibrillation but I see no documentation for it. ?He comes today to my office he would like to be established as a patient.  He denies have any cardiac complaints, there is no chest pain tightness squeezing pressure burning chest.  Recently he was discovered to have bladder cancer required chemotherapy for it.  Previously he had history of prostate cancer that is being stable.  He is overall doing well but he described to be tired exhausted and fatigued because of chemotherapy. ? ?Past Medical History:  ?Diagnosis Date  ? Angina pectoris ([place]) 08/02/2019  ? Arthritis   ? on meds  ? Atrial fibrillation ([place])   ? Backache 08/17/2012  ? Body mass index (BMI) 26.0-26.9, adult 05/08/2020  ? Cancer ([place]) 10/2015  ? prostate cancer treated with radiation  ? Carpal tunnel syndrome 01/18/2018  ? Cataract   ? sx-bilaterally  ? Cervical spondylosis without myelopathy 09/24/2015  ? Chest tightness 06/28/2019  ? Chronic back pain 05/30/2013  ? Chronic low back pain 02/20/2014  ? Colitis 11/22/2018  ? 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 Deficiency of other specified B group vitamins 05/20/2019  ? Depression  04/04/2019  ? Diabetes mellitus due to underlying condition with unspecified complications ([place]) 08/13/5007  ? Diabetes mellitus without complication ([place])   ? Type II  ? Diarrhea 11/21/2018  ? Dyslipidemia 06/17/2016  ? Dyspnea on exertion 06/28/2019  ? Dysrhythmia   ? Essential (primary) hypertension 06/17/2016  ? Essential hypertension 06/17/2016  ? GERD (gastroesophageal reflux disease)   ? on meds  ? Greater trochanteric pain syndrome 07/03/2020  ? Headache   ? History of hiatal hernia   ? History of kidney stones   ? History of prostate cancer 05/20/2019  ? Hypercholesteremia 04/04/2019  ? Hyperlipemia   ? Hypertension   ? Impaired fasting blood sugar 05/20/2019  ? Impingement syndrome of shoulder region 01/12/2018  ? Impingement syndrome of shoulder region 01/12/2018  ? Low back pain 02/20/2014  ? 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  ? Lumbar radiculopathy 05/24/2019  ? Malignant neoplasm prostate ([place]) 05/20/2019  ? Medicare annual wellness visit, subsequent 01/16/2020  ? Mixed hyperlipidemia 08/31/2019  ? Neck pain 07/03/2015  ? Paresthesia of hand 01/12/2018  ? Paresthesia of upper limb 01/12/2018  ? Paroxysmal atrial fibrillation ([place]) 04/04/2019  ? PONV (postoperative nausea and vomiting)   ? S/P cervical spinal fusion 03/04/2018  ? S/P lumbar fusion 12/09/2019  ? S/P lumbar spinal fusion 09/29/2013  ? S/P lumbar spinal fusion 09/29/2013  ? Sepsis ([place]) 11/22/2018  ? Shoulder pain 01/12/2018  ? Spinal 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 01/26/2018  ? Stenosis  of intervertebral foramina 01/12/2018  ? Trochanteric bursitis of right hip 04/19/2020  ? ? ?Past Surgical History:  ?Procedure Laterality Date  ? anal fissures    ? ANTERIOR CERVICAL DECOMP/DISCECTOMY FUSION N/A 03/04/2018  ? Procedure: Anterior Cervical Decompression Fusion - Cervical three-Cervical four - Cervical four-Cervical five;  Surgeon: Eustace Moore, MD;  Location: [place];  Service: Neurosurgery;  Laterality: N/A;  ? ANTERIOR LAT LUMBAR FUSION N/A 05/30/2020  ? Procedure: Anterior  Lateral Lumbar Fusion Lumbar One-Two with Lateral Plate Removal of pedicle screws Lumbar One and Posterior Lateral Fusion Lumbar One-Lumbar Three;  Surgeon: Eustace Moore, MD;  Location: [place];  Service: Neurosurgery;  Laterality: N/A;  ? BACK SURGERY    ? CARDIAC CATHETERIZATION    ? no PCI  ? CARPAL TUNNEL RELEASE Left 03/04/2018  ? Procedure: Carpal Tunnel Release - left;  Surgeon: Eustace Moore, MD;  Location: [place];  Service: Neurosurgery;  Laterality: Left;  ? CHOLECYSTECTOMY    ? COLONOSCOPY W/ POLYPECTOMY  08/30/2012  ? Colonic polyp status post polypectomy.  Pancolonic diverticulosis predominantly in the sigmoid colon. Small internal hemorrhoids. Moderate internal hemorhroids.   ? ESOPHAGOGASTRODUODENOSCOPY  08/04/2016  ? Schatzkis ring status post esophageal dilatation. Small hiatal hernia. Mild gastritis.   ? EYE SURGERY    ? cataract removal bilateral eyes  ? FOOT SURGERY    ? left heel  ? HARDWARE REMOVAL N/A 05/30/2020  ? Procedure: HARDWARE REMOVAL;  Surgeon: Eustace Moore, MD;  Location: [place];  Service: Neurosurgery;  Laterality: N/A;  ? HERNIA REPAIR    ? LAMINECTOMY WITH POSTERIOR LATERAL ARTHRODESIS LEVEL 2 N/A 12/09/2019  ? Procedure: Laminectomy and Foraminotomy - Lumbar one-two, Lumbar two-three, posterolateral instrumented fusion Lumbar one-three, removal of instrumentation Lumbar three-five;  Surgeon: Eustace Moore, MD;  Location: [place];  Service: Neurosurgery;  Laterality: N/A;  ? LEFT HEART CATH AND CORONARY ANGIOGRAPHY N/A 08/03/2019  ? Procedure: LEFT HEART CATH AND CORONARY ANGIOGRAPHY;  Surgeon: Jettie Booze, MD;  Location: [place] [facility];  Service: Cardiovascular;  Laterality: N/A;  ? SHOULDER SURGERY    ? bilateral  ? WISDOM TOOTH EXTRACTION    ? ? ?Current Medications: ?Current Meds  ?Medication Sig  ? amLODipine (NORVASC) 5 MG tablet Take 5 mg by mouth daily.  ? ASPIRIN LOW DOSE 81 MG EC tablet TAKE 1 TABLET BY MOUTH EVERY DAY (Patient taking differently: Take 81 mg by  mouth daily.)  ? atorvastatin (LIPITOR) 40 MG tablet TAKE 1 TABLET EVERY DAY  (NEW DOSE) (Patient taking differently: Take 40 mg by mouth daily.)  ? carboxymethylcellulose (REFRESH PLUS) 0.5 % SOLN Place 1 drop into both eyes 3 (three) times daily as needed for dry eyes (dry/irritated eyes).  ? cholecalciferol (VITAMIN D) 1000 UNITS tablet Take 1,000 Units by mouth in the morning.  ? cloNIDine (CATAPRES) 0.1 MG tablet Take 0.1 mg by mouth as needed (Take when sbp 170).  ? lisinopril (ZESTRIL) 10 MG tablet TAKE 1 TABLET (10 MG TOTAL) BY MOUTH DAILY.  ? metFORMIN (GLUCOPHAGE) 500 MG tablet Take 500 mg by mouth daily.  ? nitroGLYCERIN (NITROSTAT) 0.4 MG SL tablet Place 1 tablet (0.4 mg total) under the tongue every 5 (five) minutes x 3 doses as needed for chest pain.  ? Omega-3 Fatty Acids (FISH OIL) 1200 MG CAPS Take 1,200 mg by mouth in the morning.  ? pantoprazole (PROTONIX) 40 MG tablet Take 1 tablet (40 mg total) by mouth daily.  ? tamsulosin (  FLOMAX) 0.4 MG CAPS capsule Take 0.4 mg by mouth daily after supper.  ?  ? ?Allergies:   Oxycodone  ? ?Social History  ? ?Socioeconomic History  ? Marital status: Widowed  ?  Spouse name: Not on file  ? Number of children: 2  ? Years of education: Not on file  ? Highest education level: Not on file  ?Occupational History  ? Not on file  ?Tobacco Use  ? Smoking status: Former  ?  Packs/day: 0.50  ?  Years: 35.00  ?  Pack years: 17.50  ?  Types: Cigarettes  ?  Quit date: 02/10/1993  ?  Years since quitting: 28.2  ? Smokeless tobacco: Former  ?  Types: Chew  ?  Quit date: 02/10/2006  ?Vaping Use  ? Vaping Use: Never used  ?Substance and Sexual Activity  ? Alcohol use: Not Currently  ?  Alcohol/week: 1.0 standard drink  ?  Types: 1 Cans of beer per week  ?  Comment: occ  ? Drug use: No  ? Sexual activity: Yes  ?  Partners: Female  ?Other Topics Concern  ? Not on file  ?Social History Narrative  ? Not on file  ? ?Social Determinants of Health  ? ?Financial Resource Strain: Low Risk    ? Difficulty of Paying Living Expenses: Not hard at all  ?Food Insecurity: No Food Insecurity  ? Worried About Charity fundraiser in the Last Year: Never true  ? Ran Out of Food in the Last Year: Never tru

## 2021-05-22 DIAGNOSIS — C678 Malignant neoplasm of overlapping sites of bladder: Secondary | ICD-10-CM | POA: Diagnosis not present

## 2021-05-29 DIAGNOSIS — C678 Malignant neoplasm of overlapping sites of bladder: Secondary | ICD-10-CM | POA: Diagnosis not present

## 2021-05-29 DIAGNOSIS — R300 Dysuria: Secondary | ICD-10-CM | POA: Diagnosis not present

## 2021-06-05 DIAGNOSIS — C678 Malignant neoplasm of overlapping sites of bladder: Secondary | ICD-10-CM | POA: Diagnosis not present

## 2021-06-06 ENCOUNTER — Ambulatory Visit: Payer: Medicare PPO | Admitting: Neurology

## 2021-06-08 DIAGNOSIS — K219 Gastro-esophageal reflux disease without esophagitis: Secondary | ICD-10-CM | POA: Diagnosis not present

## 2021-06-08 DIAGNOSIS — E7800 Pure hypercholesterolemia, unspecified: Secondary | ICD-10-CM | POA: Diagnosis not present

## 2021-06-08 DIAGNOSIS — N12 Tubulo-interstitial nephritis, not specified as acute or chronic: Secondary | ICD-10-CM | POA: Diagnosis not present

## 2021-06-08 DIAGNOSIS — Z20822 Contact with and (suspected) exposure to covid-19: Secondary | ICD-10-CM | POA: Diagnosis not present

## 2021-06-08 DIAGNOSIS — N179 Acute kidney failure, unspecified: Secondary | ICD-10-CM | POA: Diagnosis not present

## 2021-06-08 DIAGNOSIS — R109 Unspecified abdominal pain: Secondary | ICD-10-CM | POA: Diagnosis not present

## 2021-06-08 DIAGNOSIS — R6520 Severe sepsis without septic shock: Secondary | ICD-10-CM | POA: Diagnosis not present

## 2021-06-08 DIAGNOSIS — E871 Hypo-osmolality and hyponatremia: Secondary | ICD-10-CM | POA: Diagnosis not present

## 2021-06-08 DIAGNOSIS — Z9221 Personal history of antineoplastic chemotherapy: Secondary | ICD-10-CM | POA: Diagnosis not present

## 2021-06-08 DIAGNOSIS — R066 Hiccough: Secondary | ICD-10-CM | POA: Diagnosis not present

## 2021-06-08 DIAGNOSIS — R319 Hematuria, unspecified: Secondary | ICD-10-CM | POA: Diagnosis not present

## 2021-06-08 DIAGNOSIS — N10 Acute tubulo-interstitial nephritis: Secondary | ICD-10-CM | POA: Diagnosis not present

## 2021-06-08 DIAGNOSIS — I10 Essential (primary) hypertension: Secondary | ICD-10-CM | POA: Diagnosis not present

## 2021-06-08 DIAGNOSIS — Z87891 Personal history of nicotine dependence: Secondary | ICD-10-CM | POA: Diagnosis not present

## 2021-06-08 DIAGNOSIS — R509 Fever, unspecified: Secondary | ICD-10-CM | POA: Diagnosis not present

## 2021-06-08 DIAGNOSIS — E785 Hyperlipidemia, unspecified: Secondary | ICD-10-CM | POA: Diagnosis not present

## 2021-06-08 DIAGNOSIS — N390 Urinary tract infection, site not specified: Secondary | ICD-10-CM | POA: Diagnosis not present

## 2021-06-08 DIAGNOSIS — A4151 Sepsis due to Escherichia coli [E. coli]: Secondary | ICD-10-CM | POA: Diagnosis not present

## 2021-06-08 DIAGNOSIS — A419 Sepsis, unspecified organism: Secondary | ICD-10-CM | POA: Diagnosis not present

## 2021-06-08 DIAGNOSIS — R7303 Prediabetes: Secondary | ICD-10-CM | POA: Diagnosis not present

## 2021-06-08 DIAGNOSIS — D696 Thrombocytopenia, unspecified: Secondary | ICD-10-CM | POA: Diagnosis not present

## 2021-06-08 DIAGNOSIS — T83592A Infection and inflammatory reaction due to indwelling ureteral stent, initial encounter: Secondary | ICD-10-CM | POA: Diagnosis not present

## 2021-06-08 HISTORY — DX: Tubulo-interstitial nephritis, not specified as acute or chronic: N12

## 2021-06-13 DIAGNOSIS — N12 Tubulo-interstitial nephritis, not specified as acute or chronic: Secondary | ICD-10-CM | POA: Diagnosis not present

## 2021-06-19 DIAGNOSIS — R319 Hematuria, unspecified: Secondary | ICD-10-CM | POA: Diagnosis not present

## 2021-06-19 DIAGNOSIS — R82998 Other abnormal findings in urine: Secondary | ICD-10-CM | POA: Diagnosis not present

## 2021-06-19 DIAGNOSIS — C678 Malignant neoplasm of overlapping sites of bladder: Secondary | ICD-10-CM | POA: Diagnosis not present

## 2021-06-19 DIAGNOSIS — N10 Acute tubulo-interstitial nephritis: Secondary | ICD-10-CM | POA: Diagnosis not present

## 2021-06-26 DIAGNOSIS — C678 Malignant neoplasm of overlapping sites of bladder: Secondary | ICD-10-CM | POA: Diagnosis not present

## 2021-06-26 DIAGNOSIS — Z87440 Personal history of urinary (tract) infections: Secondary | ICD-10-CM | POA: Diagnosis not present

## 2021-06-26 DIAGNOSIS — R82998 Other abnormal findings in urine: Secondary | ICD-10-CM | POA: Diagnosis not present

## 2021-06-26 DIAGNOSIS — Z960 Presence of urogenital implants: Secondary | ICD-10-CM | POA: Diagnosis not present

## 2021-06-26 DIAGNOSIS — R319 Hematuria, unspecified: Secondary | ICD-10-CM | POA: Diagnosis not present

## 2021-07-01 NOTE — Progress Notes (Signed)
Subjective:  Patient ID: [name], male    DOB: [birth year]  Age: 81 y.o. MRN: [id number]  Chief Complaint  Patient presents with   Hospitalization Follow-up    HPI  [name] is a 81 year old Caucasian male that presents for hospital f/u. He is accomp  Follow up Hospitalization  Patient was admitted to [facility] on 06/08/2021 and discharged on 06/12/2021. He was treated for Pyelonephritis and sepsis Treatment for this included IV antibiotics.  He reports excellent compliance with treatment. He reports this condition is resolved.  Current Outpatient Medications on File Prior to Visit  Medication Sig Dispense Refill   amLODipine (NORVASC) 5 MG tablet Take 5 mg by mouth daily.     ASPIRIN LOW DOSE 81 MG EC tablet TAKE 1 TABLET BY MOUTH EVERY DAY (Patient taking differently: Take 81 mg by mouth daily.) 90 tablet 3   atorvastatin (LIPITOR) 40 MG tablet TAKE 1 TABLET EVERY DAY  (NEW DOSE) (Patient taking differently: Take 40 mg by mouth daily.) 90 tablet 1   carboxymethylcellulose (REFRESH PLUS) 0.5 % SOLN Place 1 drop into both eyes 3 (three) times daily as needed for dry eyes (dry/irritated eyes).     cholecalciferol (VITAMIN D) 1000 UNITS tablet Take 1,000 Units by mouth in the morning.     cloNIDine (CATAPRES) 0.1 MG tablet Take 0.1 mg by mouth as needed (Take when sbp 170).     lisinopril (ZESTRIL) 10 MG tablet TAKE 1 TABLET (10 MG TOTAL) BY MOUTH DAILY. 90 tablet 0   metFORMIN (GLUCOPHAGE) 500 MG tablet Take 500 mg by mouth daily.     nitroGLYCERIN (NITROSTAT) 0.4 MG SL tablet Place 1 tablet (0.4 mg total) under the tongue every 5 (five) minutes x 3 doses as needed for chest pain. 25 tablet 12   Omega-3 Fatty Acids (FISH OIL) 1200 MG CAPS Take 1,200 mg by mouth in the morning.     pantoprazole (PROTONIX) 40 MG tablet Take 1 tablet (40 mg total) by mouth daily. 90 tablet 1   tamsulosin (FLOMAX) 0.4 MG CAPS capsule Take 0.4 mg by mouth daily after supper.  1   No current  facility-administered medications on file prior to visit.   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 06/17/2016   Deficiency of other specified B group vitamins 05/20/2019   Depression 04/04/2019   Diabetes mellitus due to underlying condition with unspecified complications ([place]) 6/96/7893   Diabetes mellitus without complication ([place])    Type II   Diarrhea 11/21/2018   Dyslipidemia 06/17/2016   Dyspnea on exertion 06/28/2019   Dysrhythmia    Essential (primary) hypertension 06/17/2016   Essential hypertension 06/17/2016   GERD (gastroesophageal reflux disease)    on meds   Greater trochanteric pain syndrome 07/03/2020   Headache    History of hiatal hernia    History of kidney stones    History of prostate cancer 05/20/2019   Hypercholesteremia 04/04/2019  Hyperlipemia    Hypertension    Impaired fasting blood sugar 05/20/2019   Impingement syndrome of shoulder region 01/12/2018   Impingement syndrome of shoulder region 01/12/2018   Low back pain 02/20/2014   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   Lumbar radiculopathy 05/24/2019   Malignant neoplasm prostate ([place]) 05/20/2019   Medicare annual wellness visit, subsequent 01/16/2020   Mixed hyperlipidemia 08/31/2019   Neck pain 07/03/2015   Paresthesia of hand 01/12/2018   Paresthesia of upper limb 01/12/2018   Paroxysmal atrial fibrillation ([place]) 04/04/2019   PONV (postoperative nausea and vomiting)    S/P cervical spinal  fusion 03/04/2018   S/P lumbar fusion 12/09/2019   S/P lumbar spinal fusion 09/29/2013   S/P lumbar spinal fusion 09/29/2013   Sepsis ([place]) 11/22/2018   Shoulder pain 01/12/2018   Spinal 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 01/26/2018   Stenosis of intervertebral foramina 01/12/2018   Trochanteric bursitis of right hip 04/19/2020   Past Surgical History:  Procedure Laterality Date   anal fissures     ANTERIOR CERVICAL DECOMP/DISCECTOMY FUSION N/A 03/04/2018   Procedure: Anterior Cervical Decompression Fusion - Cervical three-Cervical four - Cervical four-Cervical five;  Surgeon: Eustace Moore, MD;  Location: [place];  Service: Neurosurgery;  Laterality: N/A;   ANTERIOR LAT LUMBAR FUSION N/A 05/30/2020   Procedure: Anterior Lateral Lumbar Fusion Lumbar One-Two with Lateral Plate Removal of pedicle screws Lumbar One and Posterior Lateral Fusion Lumbar One-Lumbar Three;  Surgeon: Eustace Moore, MD;  Location: [place];  Service: Neurosurgery;  Laterality: N/A;   BACK SURGERY     CARDIAC CATHETERIZATION     no PCI   CARPAL TUNNEL RELEASE Left 03/04/2018   Procedure: Carpal Tunnel Release - left;  Surgeon: Eustace Moore, MD;  Location: [place];  Service: Neurosurgery;  Laterality: Left;   CHOLECYSTECTOMY     COLONOSCOPY W/ POLYPECTOMY  08/30/2012   Colonic polyp status post polypectomy.  Pancolonic diverticulosis predominantly in the sigmoid colon. Small internal hemorrhoids. Moderate internal hemorhroids.    ESOPHAGOGASTRODUODENOSCOPY  08/04/2016   Schatzkis ring status post esophageal dilatation. Small hiatal hernia. Mild gastritis.    EYE SURGERY     cataract removal bilateral eyes   FOOT SURGERY     left heel   HARDWARE REMOVAL N/A 05/30/2020   Procedure: HARDWARE REMOVAL;  Surgeon: Eustace Moore, MD;  Location: [place];  Service: Neurosurgery;  Laterality: N/A;   HERNIA REPAIR     LAMINECTOMY WITH POSTERIOR LATERAL ARTHRODESIS LEVEL 2 N/A 12/09/2019   Procedure: Laminectomy and Foraminotomy - Lumbar  one-two, Lumbar two-three, posterolateral instrumented fusion Lumbar one-three, removal of instrumentation Lumbar three-five;  Surgeon: Eustace Moore, MD;  Location: [place];  Service: Neurosurgery;  Laterality: N/A;   LEFT HEART CATH AND CORONARY ANGIOGRAPHY N/A 08/03/2019   Procedure: LEFT HEART CATH AND CORONARY ANGIOGRAPHY;  Surgeon: Jettie Booze, MD;  Location: [facility];  Service: Cardiovascular;  Laterality: N/A;   SHOULDER SURGERY     bilateral   WISDOM TOOTH EXTRACTION      Family History  Problem Relation Age of Onset   Colon cancer Neg Hx    Esophageal cancer Neg Hx    Rectal cancer Neg Hx    Stomach cancer Neg Hx    Colon polyps Neg Hx    Social History   Socioeconomic History   Marital status: Widowed    Spouse name: Not on file   Number of children: 2   Years of education: Not on file  Highest education level: Not on file  Occupational History   Not on file  Tobacco Use   Smoking status: Former    Packs/day: 0.50    Years: 35.00    Pack years: 17.50    Types: Cigarettes    Quit date: 02/10/1993    Years since quitting: 28.4   Smokeless tobacco: Former    Types: Chew    Quit date: 02/10/2006  Vaping Use   Vaping Use: Never used  Substance and Sexual Activity   Alcohol use: Not Currently    Alcohol/week: 1.0 standard drink    Types: 1 Cans of beer per week    Comment: occ   Drug use: No   Sexual activity: Yes    Partners: Female  Other Topics Concern   Not on file  Social History Narrative   Not on file   Social Determinants of Health   Financial Resource Strain: Low Risk    Difficulty of Paying Living Expenses: Not hard at all  Food Insecurity: No Food Insecurity   Worried About Charity fundraiser in the Last Year: Never true   [place] in the Last Year: Never true  Transportation Needs: No Transportation Needs   Lack of Transportation (Medical): No   Lack of Transportation (Non-Medical): No  Physical Activity: Inactive    Days of Exercise per Week: 0 days   Minutes of Exercise per Session: 0 min  Stress: No Stress Concern Present   Feeling of Stress : Not at all  Social Connections: Moderately Isolated   Frequency of Communication with Friends and Family: More than three times a week   Frequency of Social Gatherings with Friends and Family: Three times a week   Attends Religious Services: More than 4 times per year   Active Member of Clubs or Organizations: No   Attends Archivist Meetings: Never   Marital Status: Widowed    Review of Systems  Constitutional:  Negative for chills, diaphoresis, fatigue and fever.  HENT:  Negative for congestion, ear pain and sore throat.   Respiratory:  Negative for cough and shortness of breath.   Cardiovascular:  Negative for chest pain and leg swelling.  Genitourinary:  Negative for dysuria and urgency.    Objective:  BP 132/68   Pulse 71   Temp (!) 97.2 F (36.2 C)   Ht '5\' 11"'$  (1.803 m)   Wt 184 lb (83.5 kg)   SpO2 98%   BMI 25.66 kg/m       05/20/2021    2:20 PM 03/27/2021    7:59 AM 01/17/2021   10:59 AM  BP/Weight  Systolic BP 778 242 353  Diastolic BP 60 70 80  Wt. (Lbs) 190.6 194 195  BMI 26.58 kg/m2 28.24 kg/m2 27.2 kg/m2    Physical Exam Vitals reviewed.  Constitutional:      Appearance: Normal appearance.  HENT:     Head: Normocephalic.     Right Ear: Tympanic membrane normal.     Left Ear: Tympanic membrane normal.     Nose: Nose normal.     Mouth/Throat:     Mouth: Mucous membranes are moist.  Eyes:     Pupils: Pupils are equal, round, and reactive to light.  Cardiovascular:     Rate and Rhythm: Normal rate and regular rhythm.     Pulses: Normal pulses.     Heart sounds: Normal heart sounds.  Pulmonary:     Effort: Pulmonary effort is normal.  Breath sounds: Normal breath sounds.  Abdominal:     General: Bowel sounds are normal.     Palpations: Abdomen is soft.  Musculoskeletal:        General: Normal range of  motion.  Skin:    General: Skin is warm and dry.     Capillary Refill: Capillary refill takes less than 2 seconds.  Neurological:     General: No focal deficit present.     Mental Status: He is alert and oriented to person, place, and time.  Psychiatric:        Mood and Affect: Mood normal.        Behavior: Behavior normal.     Lab Results  Component Value Date   WBC 5.9 03/27/2021   HGB 15.9 03/27/2021   HCT 47.2 03/27/2021   PLT 193 03/27/2021   GLUCOSE 104 (H) 03/27/2021   CHOL 146 03/27/2021   TRIG 148 03/27/2021   HDL 45 03/27/2021   LDLCALC 75 03/27/2021   ALT 18 03/27/2021   AST 18 03/27/2021   NA 141 03/27/2021   K 4.8 03/27/2021   CL 103 03/27/2021   CREATININE 0.89 03/27/2021   BUN 14 03/27/2021   CO2 23 03/27/2021   TSH 3.000 08/31/2019   INR 0.9 05/29/2020   HGBA1C 6.3 (H) 03/27/2021   MICROALBUR 30 07/11/2020      Assessment & Plan:   1. 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 Gross hematuria - CBC with Differential/Platelet - Comprehensive metabolic panel - POCT URINALYSIS DIP (CLINITEK) - Urine Culture  3. Acute pyelonephritis-improved - CBC with Differential/Platelet - Comprehensive metabolic panel - POCT URINALYSIS DIP (CLINITEK)  4. Primary hypertension-well controlled - lisinopril (ZESTRIL) 20 MG tablet; Take 1 tablet (20 mg total) by mouth daily.  Dispense: 90 tablet; Refill: 1      Push fluids, especially water Follow-up with urology as scheduled Follow-up as needed We will call you with lab results   Follow-up: PRN  An After Visit Summary was printed and given to the patient.  I, Rip Harbour, NP, have reviewed all documentation for this visit. The documentation on 07/02/21 for the exam, diagnosis, procedures, and orders are all accurate and complete.    Signed, Rip Harbour, NP [place] [phone]

## 2021-07-02 ENCOUNTER — Encounter: Payer: Self-pay | Admitting: Nurse Practitioner

## 2021-07-02 ENCOUNTER — Ambulatory Visit: Payer: Medicare PPO | Admitting: Nurse Practitioner

## 2021-07-02 VITALS — BP 132/68 | HR 71 | Temp 97.2°F | Ht 71.0 in | Wt 184.0 lb

## 2021-07-02 DIAGNOSIS — I10 Essential (primary) hypertension: Secondary | ICD-10-CM | POA: Diagnosis not present

## 2021-07-02 DIAGNOSIS — R310 Gross hematuria: Secondary | ICD-10-CM | POA: Diagnosis not present

## 2021-07-02 DIAGNOSIS — N10 Acute tubulo-interstitial nephritis: Secondary | ICD-10-CM | POA: Diagnosis not present

## 2021-07-02 DIAGNOSIS — C678 Malignant neoplasm of overlapping sites of bladder: Secondary | ICD-10-CM

## 2021-07-02 LAB — POCT URINALYSIS DIP (CLINITEK)
Bilirubin, UA: NEGATIVE
Glucose, UA: NEGATIVE mg/dL
Ketones, POC UA: NEGATIVE mg/dL
Nitrite, UA: NEGATIVE
POC PROTEIN,UA: 30 — AB
Spec Grav, UA: 1.02 (ref 1.010–1.025)
Urobilinogen, UA: 0.2 E.U./dL
pH, UA: 6 (ref 5.0–8.0)

## 2021-07-02 MED ORDER — LISINOPRIL 20 MG PO TABS: 20.0000 mg | ORAL_TABLET | Freq: Every day | ORAL | 1 refills | Status: DC

## 2021-07-02 NOTE — Patient Instructions (Signed)
Push fluids, especially water Follow-up with urology as scheduled Follow-up as needed We will call you with lab results  Urinary Tract Infection, Adult A urinary tract infection (UTI) is an infection of any part of the urinary tract. The urinary tract includes: The kidneys. The ureters. The bladder. The urethra. These organs make, store, and get rid of pee (urine) in the body. What are the causes? This infection is caused by germs (bacteria) in your genital area. These germs grow and cause swelling (inflammation) of your urinary tract. What increases the risk? The following factors may make you more likely to develop this condition: Using a small, thin tube (catheter) to drain pee. Not being able to control when you pee or poop (incontinence). Being male. If you are male, these things can increase the risk: Using these methods to prevent pregnancy: A medicine that kills sperm (spermicide). A device that blocks sperm (diaphragm). Having low levels of a male hormone (estrogen). Being pregnant. You are more likely to develop this condition if: You have genes that add to your risk. You are sexually active. You take antibiotic medicines. You have trouble peeing because of: A prostate that is bigger than normal, if you are male. A blockage in the part of your body that drains pee from the bladder. A kidney stone. A nerve condition that affects your bladder. Not getting enough to drink. Not peeing often enough. You have other conditions, such as: Diabetes. A weak disease-fighting system (immune system). Sickle cell disease. Gout. Injury of the spine. What are the signs or symptoms? Symptoms of this condition include: Needing to pee right away. Peeing small amounts often. Pain or burning when peeing. Blood in the pee. Pee that smells bad or not like normal. Trouble peeing. Pee that is cloudy. Fluid coming from the vagina, if you are male. Pain in the belly or lower  back. Other symptoms include: Vomiting. Not feeling hungry. Feeling mixed up (confused). This may be the first symptom in older adults. Being tired and grouchy (irritable). A fever. Watery poop (diarrhea). How is this treated? Taking antibiotic medicine. Taking other medicines. Drinking enough water. In some cases, you may need to see a specialist. Follow these instructions at home:  Medicines Take over-the-counter and prescription medicines only as told by your doctor. If you were prescribed an antibiotic medicine, take it as told by your doctor. Do not stop taking it even if you start to feel better. General instructions Make sure you: Pee until your bladder is empty. Do not hold pee for a long time. Empty your bladder after sex. Wipe from front to back after peeing or pooping if you are a male. Use each tissue one time when you wipe. Drink enough fluid to keep your pee pale yellow. Keep all follow-up visits. Contact a doctor if: You do not get better after 1-2 days. Your symptoms go away and then come back. Get help right away if: You have very bad back pain. You have very bad pain in your lower belly. You have a fever. You have chills. You feeling like you will vomit or you vomit. Summary A urinary tract infection (UTI) is an infection of any part of the urinary tract. This condition is caused by germs in your genital area. There are many risk factors for a UTI. Treatment includes antibiotic medicines. Drink enough fluid to keep your pee pale yellow. This information is not intended to replace advice given to you by your health care provider. Make sure you  discuss any questions you have with your health care provider. Document Revised: 09/09/2019 Document Reviewed: 09/09/2019 Elsevier Patient Education  [place].   Pyelonephritis, Adult  Pyelonephritis is an infection that occurs in the kidney. The kidneys are organs that help clean the blood by moving  waste out of the blood and into the pee (urine). This infection can happen quickly, or it can last for a long time. In most cases, it clears up with treatment and does not cause other problems. What are the causes? This condition may be caused by: Germs (bacteria) going from the bladder up to the kidney. This may happen after having a bladder infection. Germs going from the blood to the kidney. What increases the risk? This condition is more likely to develop in: Pregnant women. Older people. People who have any of these conditions: Diabetes. Inflammation of the prostate gland (prostatitis), in males. Kidney stones or bladder stones. Other problems with the kidney or the parts of your body that carry pee from the kidneys to the bladder (ureters). Cancer. People who have a small, thin tube (catheter) placed in the bladder. People who are sexually active. Women who use a medicine that kills sperm (spermicide) to prevent pregnancy. People who have had a prior urinary tract infection (UTI). What are the signs or symptoms? Symptoms of this condition include: Peeing often. A strong urge to pee right away. Burning or stinging when peeing. Belly pain. Back pain. Pain in the side (flank area). Fever or chills. Blood in the pee, or dark pee. Feeling sick to your stomach (nauseous) or throwing up (vomiting). How is this treated? This condition may be treated by: Taking antibiotic medicines by mouth (orally). Drinking enough fluids. If the infection is bad, you may need to stay in the hospital. You may be given antibiotics and fluids that are put directly into a vein through an IV tube. In some cases, other treatments may be needed. Follow these instructions at home: Medicines Take your antibiotic medicine as told by your doctor. Do not stop taking the antibiotic even if you start to feel better. Take over-the-counter and prescription medicines only as told by your doctor. General  instructions  Drink enough fluid to keep your pee pale yellow. Avoid caffeine, tea, and carbonated drinks. Pee (urinate) often. Avoid holding in pee for long periods of time. Pee before and after sex. After pooping (having a bowel movement), women should wipe from front to back. Use each tissue only once. Keep all follow-up visits as told by your doctor. This is important. Contact a doctor if: You do not feel better after 2 days. Your symptoms get worse. You have a fever. Get help right away if: You cannot take your medicine or drink fluids as told. You have chills and shaking. You throw up. You have very bad pain in your side or back. You feel very weak or you pass out (faint). Summary Pyelonephritis is an infection that occurs in the kidney. In most cases, this infection clears up with treatment and does not cause other problems. Take your antibiotic medicine as told by your doctor. Do not stop taking the antibiotic even if you start to feel better. Drink enough fluid to keep your pee pale yellow. This information is not intended to replace advice given to you by your health care provider. Make sure you discuss any questions you have with your health care provider. Document Revised: 09/06/2020 Document Reviewed: 09/06/2020 Elsevier Patient Education  [place].

## 2021-07-03 LAB — COMPREHENSIVE METABOLIC PANEL
ALT: 19 IU/L (ref 0–44)
AST: 19 IU/L (ref 0–40)
Albumin/Globulin Ratio: 1.9 (ref 1.2–2.2)
Albumin: 4.4 g/dL (ref 3.6–4.6)
Alkaline Phosphatase: 92 IU/L (ref 44–121)
BUN/Creatinine Ratio: 18 (ref 10–24)
BUN: 15 mg/dL (ref 8–27)
Bilirubin Total: 0.8 mg/dL (ref 0.0–1.2)
CO2: 21 mmol/L (ref 20–29)
Calcium: 9.5 mg/dL (ref 8.6–10.2)
Chloride: 105 mmol/L (ref 96–106)
Creatinine, Ser: 0.82 mg/dL (ref 0.76–1.27)
Globulin, Total: 2.3 g/dL (ref 1.5–4.5)
Glucose: 96 mg/dL (ref 70–99)
Potassium: 4.8 mmol/L (ref 3.5–5.2)
Sodium: 140 mmol/L (ref 134–144)
Total Protein: 6.7 g/dL (ref 6.0–8.5)
eGFR: 87 mL/min/{1.73_m2} (ref >59)

## 2021-07-03 LAB — CBC WITH DIFFERENTIAL/PLATELET
Basophils Absolute: 0 10*3/uL (ref 0.0–0.2)
Basos: 1 %
EOS (ABSOLUTE): 0.1 10*3/uL (ref 0.0–0.4)
Eos: 3 %
Hematocrit: 41.8 % (ref 37.5–51.0)
Hemoglobin: 14.3 g/dL (ref 13.0–17.7)
Immature Grans (Abs): 0 10*3/uL (ref 0.0–0.1)
Immature Granulocytes: 0 %
Lymphocytes Absolute: 1 10*3/uL (ref 0.7–3.1)
Lymphs: 23 %
MCH: 30.4 pg (ref 26.6–33.0)
MCHC: 34.2 g/dL (ref 31.5–35.7)
MCV: 89 fL (ref 79–97)
Monocytes Absolute: 0.5 10*3/uL (ref 0.1–0.9)
Monocytes: 12 %
Neutrophils Absolute: 2.7 10*3/uL (ref 1.4–7.0)
Neutrophils: 61 %
Platelets: 179 10*3/uL (ref 150–450)
RBC: 4.7 x10E6/uL (ref 4.14–5.80)
RDW: 13.2 % (ref 11.6–15.4)
WBC: 4.4 10*3/uL (ref 3.4–10.8)

## 2021-07-04 LAB — URINE CULTURE: Organism ID, Bacteria: NO GROWTH

## 2021-07-09 DIAGNOSIS — C652 Malignant neoplasm of left renal pelvis: Secondary | ICD-10-CM | POA: Diagnosis not present

## 2021-07-09 DIAGNOSIS — D090 Carcinoma in situ of bladder: Secondary | ICD-10-CM | POA: Diagnosis not present

## 2021-07-09 DIAGNOSIS — I509 Heart failure, unspecified: Secondary | ICD-10-CM | POA: Diagnosis not present

## 2021-07-09 DIAGNOSIS — N400 Enlarged prostate without lower urinary tract symptoms: Secondary | ICD-10-CM | POA: Diagnosis not present

## 2021-07-09 DIAGNOSIS — D4112 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: Secondary | ICD-10-CM | POA: Diagnosis not present

## 2021-07-09 DIAGNOSIS — E785 Hyperlipidemia, unspecified: Secondary | ICD-10-CM | POA: Diagnosis not present

## 2021-07-09 DIAGNOSIS — C679 Malignant neoplasm of bladder, unspecified: Secondary | ICD-10-CM | POA: Diagnosis not present

## 2021-07-09 DIAGNOSIS — E119 Type 2 diabetes mellitus without complications: Secondary | ICD-10-CM | POA: Diagnosis not present

## 2021-07-09 DIAGNOSIS — C678 Malignant neoplasm of overlapping sites of bladder: Secondary | ICD-10-CM | POA: Diagnosis not present

## 2021-07-09 DIAGNOSIS — N2889 Other specified disorders of kidney and ureter: Secondary | ICD-10-CM | POA: Diagnosis not present

## 2021-07-09 DIAGNOSIS — I110 Hypertensive heart disease with heart failure: Secondary | ICD-10-CM | POA: Diagnosis not present

## 2021-07-09 DIAGNOSIS — I4891 Unspecified atrial fibrillation: Secondary | ICD-10-CM | POA: Diagnosis not present

## 2021-07-09 DIAGNOSIS — I2510 Atherosclerotic heart disease of native coronary artery without angina pectoris: Secondary | ICD-10-CM | POA: Diagnosis not present

## 2021-07-29 DIAGNOSIS — D4112 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: Secondary | ICD-10-CM | POA: Diagnosis not present

## 2021-07-29 DIAGNOSIS — C678 Malignant neoplasm of overlapping sites of bladder: Secondary | ICD-10-CM | POA: Diagnosis not present

## 2021-07-30 NOTE — Progress Notes (Signed)
NEUROLOGY FOLLOW UP OFFICE NOTE  [name] [id number]  Assessment/Plan:   New onset headaches - given brain MRI findings, consider intracranial hypotension due to CSF leak.  Skull based leak unlikely, consider spontaneous leak somewhere along the spinal canal.   At this point, we will continue to monitor.  He has been focusing on treatment for his bladder cancer.  Headaches are now infrequent.  If they should worsen, he will reach out to me and we can pursue repeat MRI of brain and referral to [facility].   Subjective:  [name] is an 81 year old male with a fib, CAD, DM II, HTN, HLD who follows up for headache.  He is accompanied by his wife and daughter.   UPDATE: I had referred him to [facility] for consideration of CT myelogram and evaluation for possible CSF leak.  However he was diagnosed with bladder cancer.  Never received a call from [place].  Somewhere along the way, he stopped taking nortriptyline.  Headaches occur now about once or twice every 2-3 weeks.  Typically lasts all night.  Sometimes still feels swimmy-headed.    HISTORY: In early September 2022, he developed a new headache described as a severe ice pick pain in his right temple.  It woke him up out of sleep.  It would last 10 seconds at a time and occur throughout the day.  The next day, he leaned over and clear fluid ran out of both nostrils.  He reported no upper respiratory symptoms.  He was seen in the ED at [facility] on 9/7 where he was diagnosed with trigeminal neuralgia and discharged on carbamazepine.  He then developed a persistent pressure pain behind and around his right eye.  Laying down lessens the pain.  It is not aggravated by valsalva/coughing.  Bending over or turning neck to the right aggravates the pain.  Sometimes nausea.  No visual disturbance, autonomic symptoms, photophobia or phonophobia.  He also has become unsteady on his feet.  MRI of brain with and without contrast on 10/19/2020  showed chronic small vessel ischemic changes in the cerebral white matter as well as mild prominence and hyperenhancement of the dura with enlargement and rounding of the left-greater-than right transverse sinus raising possibility of spontaneous intracranial hypotension.  He followed up with Dr. Sherley Bounds of neurosurgery.  He underwent CT cisternogram on 10/24/2020 which revealed opening pressure of 13 cm H2O and no evidence of CSF leak.  CT of sinuses revealed no evidence of bone defect/fracture or fluid opacification. Sed rate and CRP on 11/21/2020 were 14 and < 1.0 respectively.  MRI of brain with and without contrast on 12/08/2020 personally reviewed, showed unchanged dural enhancement no new or progressive abnormalities.  MRA of head was normal.  [facility] on 12/25/2020.  Plan is to collect drainage for Beta-2 Transferrin testing.  However, he has not produced any fluid to collect.  However, there is doubt that it is CSF.   Patient has history of lumbar spine discectomy and fusion from L through L5.  CT lumbar myelogram on 04/25/2020 showed screw loosening bilaterally at L1 with pronounced lucency around the hardware.     Past medication: carbamazepine, Depakote, gabapentin (side effects)  PAST MEDICAL HISTORY: Past Medical History:  Diagnosis Date   Angina pectoris ([place]) 08/02/2019   Arthritis    on meds   Atrial fibrillation ([place])    Backache 08/17/2012  Body mass index (BMI) 26.0-26.9, adult 05/08/2020   Cancer ([place]) 10/2015   prostate cancer treated with radiation   Carpal tunnel syndrome 01/18/2018   Cataract    sx-bilaterally   Cervical spondylosis without myelopathy 09/24/2015   Chest tightness 06/28/2019   Chronic back pain 05/30/2013   Chronic low back pain 02/20/2014   Colitis 11/22/2018   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 06/17/2016   Deficiency of other  specified B group vitamins 05/20/2019   Depression 04/04/2019   Diabetes mellitus due to underlying condition with unspecified complications ([place]) 0/11/2723   Diabetes mellitus without complication ([place])    Type II   Diarrhea 11/21/2018   Dyslipidemia 06/17/2016   Dyspnea on exertion 06/28/2019   Dysrhythmia    Essential (primary) hypertension 06/17/2016   Essential hypertension 06/17/2016   GERD (gastroesophageal reflux disease)    on meds   Greater trochanteric pain syndrome 07/03/2020   Headache    History of hiatal hernia    History of kidney stones    History of prostate cancer 05/20/2019   Hypercholesteremia 04/04/2019   Hyperlipemia    Hypertension    Impaired fasting blood sugar 05/20/2019   Impingement syndrome of shoulder region 01/12/2018   Impingement syndrome of shoulder region 01/12/2018   Low back pain 02/20/2014   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   Lumbar radiculopathy 05/24/2019   Malignant neoplasm prostate ([place]) 05/20/2019   Medicare annual wellness visit, subsequent 01/16/2020   Mixed hyperlipidemia 08/31/2019   Neck pain 07/03/2015   Paresthesia of hand 01/12/2018   Paresthesia of upper limb 01/12/2018   Paroxysmal atrial fibrillation ([place]) 04/04/2019   PONV (postoperative nausea and vomiting)    S/P cervical spinal fusion 03/04/2018   S/P lumbar fusion 12/09/2019   S/P lumbar spinal fusion 09/29/2013   S/P lumbar spinal fusion 09/29/2013   Sepsis ([place]) 11/22/2018   Shoulder pain 01/12/2018   Spinal 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 01/26/2018   Stenosis of intervertebral foramina 01/12/2018   Trochanteric bursitis of right hip 04/19/2020    MEDICATIONS: Current Outpatient Medications on File Prior to Visit  Medication Sig Dispense Refill   amLODipine (NORVASC) 5 MG tablet Take 5 mg by mouth daily.     ASPIRIN LOW DOSE 81 MG EC tablet TAKE 1 TABLET BY MOUTH EVERY DAY (Patient taking differently: Take 81 mg by mouth daily.) 90 tablet 3   atorvastatin (LIPITOR) 40 MG tablet TAKE 1  TABLET EVERY DAY  (NEW DOSE) (Patient taking differently: Take 40 mg by mouth daily.) 90 tablet 1   carboxymethylcellulose (REFRESH PLUS) 0.5 % SOLN Place 1 drop into both eyes 3 (three) times daily as needed for dry eyes (dry/irritated eyes).     cholecalciferol (VITAMIN D) 1000 UNITS tablet Take 1,000 Units by mouth in the morning.     cloNIDine (CATAPRES) 0.1 MG tablet Take 0.1 mg by mouth as needed (Take when sbp 170).     lisinopril (ZESTRIL) 20 MG tablet Take 1 tablet (20 mg total) by mouth daily. 90 tablet 1   metFORMIN (GLUCOPHAGE) 500 MG tablet Take 500 mg by mouth daily.     nitroGLYCERIN (NITROSTAT) 0.4 MG SL tablet Place 1 tablet (0.4 mg total) under the tongue every 5 (five) minutes x 3 doses as needed for chest pain. 25 tablet 12   Omega-3 Fatty Acids (FISH OIL) 1200 MG CAPS Take 1,200 mg by mouth in the morning.     pantoprazole (PROTONIX) 40 MG tablet Take 1 tablet (40  mg total) by mouth daily. 90 tablet 1   tamsulosin (FLOMAX) 0.4 MG CAPS capsule Take 0.4 mg by mouth daily after supper.  1   No current facility-administered medications on file prior to visit.    ALLERGIES: Allergies  Allergen Reactions   Oxycodone Other (See Comments)    loopy "loopy"    FAMILY HISTORY: Family History  Problem Relation Age of Onset   Colon cancer Neg Hx    Esophageal cancer Neg Hx    Rectal cancer Neg Hx    Stomach cancer Neg Hx    Colon polyps Neg Hx       Objective:  Blood pressure 134/75, pulse 70, height '5\' 11"'$  (1.803 m), weight 189 lb 6.4 oz (85.9 kg), SpO2 97 %.  General: No acute distress.  Patient appears well-groomed.   Head:  Normocephalic/atraumatic Eyes:  Fundi examined but not visualized Neck: supple, no paraspinal tenderness, full range of motion Heart:  Regular rate and rhythm Neurological Exam: alert and oriented to person, place, and time.  Speech fluent and not dysarthric, language intact.  CN II-XII intact. Bulk and tone normal, muscle strength 5/5  throughout.  Sensation to light touch intact.  Deep tendon reflexes 2+ throughout.  Finger to nose testing intact.  Gait normal, Romberg negative.   Metta Clines, DO  CC: Rochel Brome, MD

## 2021-07-31 ENCOUNTER — Encounter: Payer: Self-pay | Admitting: Neurology

## 2021-07-31 ENCOUNTER — Ambulatory Visit: Payer: Medicare PPO | Admitting: Neurology

## 2021-07-31 VITALS — BP 134/75 | HR 70 | Ht 71.0 in | Wt 189.4 lb

## 2021-07-31 DIAGNOSIS — G96810 Intracranial hypotension, unspecified: Secondary | ICD-10-CM | POA: Diagnosis not present

## 2021-07-31 NOTE — Patient Instructions (Signed)
Continue to monitor.  If symptoms worsen, contact me and we can repeat imaging and referral to [facility]

## 2021-08-07 ENCOUNTER — Ambulatory Visit: Payer: Medicare PPO | Admitting: Neurology

## 2021-08-07 DIAGNOSIS — R82998 Other abnormal findings in urine: Secondary | ICD-10-CM | POA: Diagnosis not present

## 2021-08-07 DIAGNOSIS — C678 Malignant neoplasm of overlapping sites of bladder: Secondary | ICD-10-CM | POA: Diagnosis not present

## 2021-08-30 ENCOUNTER — Other Ambulatory Visit: Payer: Self-pay | Admitting: Family Medicine

## 2021-08-30 ENCOUNTER — Encounter: Payer: Self-pay | Admitting: Family Medicine

## 2021-09-02 ENCOUNTER — Other Ambulatory Visit: Payer: Self-pay

## 2021-09-02 MED ORDER — METFORMIN HCL 500 MG PO TABS: 500.0000 mg | ORAL_TABLET | Freq: Every day | ORAL | 1 refills | Status: DC

## 2021-09-02 MED ORDER — FAMOTIDINE 40 MG PO TABS: 40.0000 mg | ORAL_TABLET | Freq: Every day | ORAL | 0 refills | Status: DC

## 2021-09-02 NOTE — Telephone Encounter (Signed)
Patient notified.  He is willing to try pepcid.

## 2021-09-04 DIAGNOSIS — C678 Malignant neoplasm of overlapping sites of bladder: Secondary | ICD-10-CM | POA: Diagnosis not present

## 2021-09-18 DIAGNOSIS — Z981 Arthrodesis status: Secondary | ICD-10-CM | POA: Diagnosis not present

## 2021-09-18 DIAGNOSIS — Z9049 Acquired absence of other specified parts of digestive tract: Secondary | ICD-10-CM | POA: Diagnosis not present

## 2021-09-18 DIAGNOSIS — C678 Malignant neoplasm of overlapping sites of bladder: Secondary | ICD-10-CM | POA: Diagnosis not present

## 2021-09-18 DIAGNOSIS — M8588 Other specified disorders of bone density and structure, other site: Secondary | ICD-10-CM | POA: Diagnosis not present

## 2021-09-18 DIAGNOSIS — N2889 Other specified disorders of kidney and ureter: Secondary | ICD-10-CM | POA: Diagnosis not present

## 2021-09-18 DIAGNOSIS — M5137 Other intervertebral disc degeneration, lumbosacral region: Secondary | ICD-10-CM | POA: Diagnosis not present

## 2021-09-18 DIAGNOSIS — M5136 Other intervertebral disc degeneration, lumbar region: Secondary | ICD-10-CM | POA: Diagnosis not present

## 2021-09-18 DIAGNOSIS — D4112 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: Secondary | ICD-10-CM | POA: Diagnosis not present

## 2021-09-20 DIAGNOSIS — C678 Malignant neoplasm of overlapping sites of bladder: Secondary | ICD-10-CM | POA: Diagnosis not present

## 2021-09-23 NOTE — Progress Notes (Unsigned)
Subjective:  Patient ID: [name], male    DOB: 1940/06/10  Age: 81 y.o. MRN: [id number]  Chief Complaint  Patient presents with   Diabetes   Gastroesophageal Reflux    Since hospitalization couple months back     HPI Prediabetes: Taking Metformin 500 mg daily. Last a1c 6.4.   GERD: Taking Famotidine 40 mg daily. Not controlled.   Hyperlipidemia: Current medications: Atorvastatin 40 mg daily, Fish Oil 1200 mg every morning.  Hypertension: Complications: Current medications: Amlodipine 5 mg daily, Aspirin 81 mg daily, Clonidine 0.1 mg daily PRN, Lisinopril 20 mg daily.  Bladder cancer: Patient was seeing Dr. Warrick Parisian, but is being transferred to another urologist.  1. Bladder cancer s/p TURBT [id number], Gem/Doc. 2. 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  Has been having chemo installations into bladder 3 times in a month and then repeat cystoscopy.   Patient was hospitalized at [facility] for urosepsis. Recovering.   Has had increased back pain and saw urology, who requested patient see Dr. Ronnald Ramp. The overall feeling is that it is related to urology.   Current Outpatient Medications on File Prior to Visit  Medication Sig Dispense Refill   amLODipine (NORVASC) 5 MG tablet Take 5 mg by mouth daily.     ASPIRIN LOW DOSE 81 MG EC tablet TAKE 1 TABLET BY MOUTH EVERY DAY (Patient taking differently: Take 81 mg by mouth daily.) 90 tablet 3   atorvastatin (LIPITOR) 40 MG tablet TAKE 1 TABLET EVERY DAY  (NEW DOSE) (Patient taking differently: Take 40 mg by mouth daily.) 90 tablet 1   carboxymethylcellulose (REFRESH PLUS) 0.5 % SOLN Place 1 drop into both eyes 3 (three) times daily as needed for dry eyes (dry/irritated eyes).     cholecalciferol (VITAMIN D) 1000 UNITS tablet Take 1,000 Units by mouth in the morning.     cloNIDine (CATAPRES) 0.1 MG tablet Take 0.1 mg by mouth as needed (Take when sbp 170).     famotidine (PEPCID) 40 MG  tablet Take 1 tablet (40 mg total) by mouth daily. 90 tablet 0   lisinopril (ZESTRIL) 20 MG tablet Take 1 tablet (20 mg total) by mouth daily. 90 tablet 1   metFORMIN (GLUCOPHAGE) 500 MG tablet Take 1 tablet (500 mg total) by mouth daily. 90 tablet 1   nitroGLYCERIN (NITROSTAT) 0.4 MG SL tablet Place 1 tablet (0.4 mg total) under the tongue every 5 (five) minutes x 3 doses as needed for chest pain. 25 tablet 12   Omega-3 Fatty Acids (FISH OIL) 1200 MG CAPS Take 1,200 mg by mouth in the morning.     oxybutynin (DITROPAN-XL) 5 MG 24 hr tablet Take 5 mg by mouth daily.     tamsulosin (FLOMAX) 0.4 MG CAPS capsule Take 0.4 mg by mouth daily after supper.  1   No current facility-administered medications on file prior to visit.   Past Medical History:  Diagnosis Date   Angina pectoris ([place]) 08/02/2019   Arthritis    on meds   Atrial fibrillation ([place])    Backache 08/17/2012   Body mass index (BMI) 26.0-26.9, adult 05/08/2020   Cancer ([place]) 10/2015   prostate cancer treated with radiation   Carpal tunnel syndrome 01/18/2018   Cataract    sx-bilaterally   Cervical spondylosis without myelopathy 09/24/2015   Chest tightness 06/28/2019   Chronic back pain 05/30/2013   Chronic low back pain 02/20/2014   Colitis 11/22/2018   Coronary artery disease involving native  coronary artery without angina pectoris 06/17/2016   Deficiency of other specified B group vitamins 05/20/2019   Depression 04/04/2019   Diabetes mellitus due to underlying condition with unspecified complications ([place]) 9/51/8841   Diabetes mellitus without complication ([place])    Type II   Diarrhea 11/21/2018   Dyslipidemia 06/17/2016   Dyspnea on exertion 06/28/2019   Dysrhythmia    Essential (primary) hypertension 06/17/2016   Essential hypertension 06/17/2016   GERD (gastroesophageal reflux disease)    on meds   Greater trochanteric pain syndrome 07/03/2020   Headache    History of hiatal hernia    History of kidney stones    History of  prostate cancer 05/20/2019   Hypercholesteremia 04/04/2019   Hyperlipemia    Hypertension    Impaired fasting blood sugar 05/20/2019   Impingement syndrome of shoulder region 01/12/2018   Impingement syndrome of shoulder region 01/12/2018   Low back pain 02/20/2014   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   Lumbar radiculopathy 05/24/2019   Malignant neoplasm prostate ([place]) 05/20/2019   Medicare annual wellness visit, subsequent 01/16/2020   Mixed hyperlipidemia 08/31/2019   Neck pain 07/03/2015   Paresthesia of hand 01/12/2018   Paresthesia of upper limb 01/12/2018   Paroxysmal atrial fibrillation ([place]) 04/04/2019   PONV (postoperative nausea and vomiting)    S/P cervical spinal fusion 03/04/2018   S/P lumbar fusion 12/09/2019   S/P lumbar spinal fusion 09/29/2013   S/P lumbar spinal fusion 09/29/2013   Sepsis ([place]) 11/22/2018   Shoulder pain 01/12/2018   Spinal 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 01/26/2018   Stenosis of intervertebral foramina 01/12/2018   Trochanteric bursitis of right hip 04/19/2020   Past Surgical History:  Procedure Laterality Date   anal fissures     ANTERIOR CERVICAL DECOMP/DISCECTOMY FUSION N/A 03/04/2018   Procedure: Anterior Cervical Decompression Fusion - Cervical three-Cervical four - Cervical four-Cervical five;  Surgeon: Eustace Moore, MD;  Location: [place];  Service: Neurosurgery;  Laterality: N/A;   ANTERIOR LAT LUMBAR FUSION N/A 05/30/2020   Procedure: Anterior Lateral Lumbar Fusion Lumbar One-Two with Lateral Plate Removal of pedicle screws Lumbar One and Posterior Lateral Fusion Lumbar One-Lumbar Three;  Surgeon: Eustace Moore, MD;  Location: [place];  Service: Neurosurgery;  Laterality: N/A;   BACK SURGERY     CARDIAC CATHETERIZATION     no PCI   CARPAL TUNNEL RELEASE Left 03/04/2018   Procedure: Carpal Tunnel Release - left;  Surgeon: Eustace Moore, MD;  Location: [facility];  Service: Neurosurgery;  Laterality: Left;   CHOLECYSTECTOMY     COLONOSCOPY W/ POLYPECTOMY  08/30/2012    Colonic polyp status post polypectomy.  Pancolonic diverticulosis predominantly in the sigmoid colon. Small internal hemorrhoids. Moderate internal hemorhroids.    ESOPHAGOGASTRODUODENOSCOPY  08/04/2016   Schatzkis ring status post esophageal dilatation. Small hiatal hernia. Mild gastritis.    EYE SURGERY     cataract removal bilateral eyes   FOOT SURGERY     left heel   HARDWARE REMOVAL N/A 05/30/2020   Procedure: HARDWARE REMOVAL;  Surgeon: Eustace Moore, MD;  Location: [facility];  Service: Neurosurgery;  Laterality: N/A;   HERNIA REPAIR     LAMINECTOMY WITH POSTERIOR LATERAL ARTHRODESIS LEVEL 2 N/A 12/09/2019   Procedure: Laminectomy and Foraminotomy - Lumbar one-two, Lumbar two-three, posterolateral instrumented fusion Lumbar one-three, removal of instrumentation Lumbar three-five;  Surgeon: Eustace Moore, MD;  Location: [facility];  Service: Neurosurgery;  Laterality: N/A;   LEFT HEART CATH AND CORONARY ANGIOGRAPHY N/A 08/03/2019  Procedure: LEFT HEART CATH AND CORONARY ANGIOGRAPHY;  Surgeon: Jettie Booze, MD;  Location: [facility];  Service: Cardiovascular;  Laterality: N/A;   SHOULDER SURGERY     bilateral   WISDOM TOOTH EXTRACTION      Family History  Problem Relation Age of Onset   Colon cancer Neg Hx    Esophageal cancer Neg Hx    Rectal cancer Neg Hx    Stomach cancer Neg Hx    Colon polyps Neg Hx    Social History   Socioeconomic History   Marital status: Widowed    Spouse name: Not on file   Number of children: 2   Years of education: Not on file   Highest education level: Not on file  Occupational History   Not on file  Tobacco Use   Smoking status: Former    Packs/day: 0.50    Years: 35.00    Total pack years: 17.50    Types: Cigarettes    Quit date: 02/10/1993    Years since quitting: 28.6   Smokeless tobacco: Former    Types: Chew    Quit date: 02/10/2006  Vaping Use   Vaping Use: Never used  Substance and Sexual Activity   Alcohol use: Not  Currently    Alcohol/week: 1.0 standard drink of alcohol    Types: 1 Cans of beer per week    Comment: occ   Drug use: No   Sexual activity: Yes    Partners: Female  Other Topics Concern   Not on file  Social History Narrative   Not on file   Social Determinants of Health   Financial Resource Strain: Low Risk  (01/17/2021)   Overall Financial Resource Strain (CARDIA)    Difficulty of Paying Living Expenses: Not hard at all  Food Insecurity: No Food Insecurity (01/17/2021)   Hunger Vital Sign    Worried About Running Out of Food in the Last Year: Never true    Banks in the Last Year: Never true  Transportation Needs: No Transportation Needs (01/17/2021)   PRAPARE - Hydrologist (Medical): No    Lack of Transportation (Non-Medical): No  Physical Activity: Inactive (01/17/2021)   Exercise Vital Sign    Days of Exercise per Week: 0 days    Minutes of Exercise per Session: 0 min  Stress: No Stress Concern Present (01/17/2021)   [place]    Feeling of Stress : Not at all  Social Connections: Moderately Isolated (01/17/2021)   Social Connection and Isolation Panel [NHANES]    Frequency of Communication with Friends and Family: More than three times a week    Frequency of Social Gatherings with Friends and Family: Three times a week    Attends Religious Services: More than 4 times per year    Active Member of Clubs or Organizations: No    Attends Archivist Meetings: Never    Marital Status: Widowed    Review of Systems  Constitutional:  Negative for appetite change, fatigue and fever.  HENT:  Negative for congestion, ear pain, sinus pressure and sore throat.   Respiratory:  Negative for cough, shortness of breath and wheezing.   Cardiovascular:  Negative for chest pain and palpitations.  Gastrointestinal:  Positive for abdominal pain. Negative for constipation,  diarrhea, nausea and vomiting.  Genitourinary:  Negative for dysuria and frequency.  Musculoskeletal:  Positive for back pain. Negative for  arthralgias, joint swelling and myalgias.  Skin:  Negative for rash.  Neurological:  Negative for dizziness, weakness and headaches.  Psychiatric/Behavioral:  Negative for dysphoric mood. The patient is not nervous/anxious.      Objective:  BP (!) 112/56   Pulse 66   Resp 18   Ht '5\' 11"'$  (1.803 m)   Wt 190 lb (86.2 kg)   SpO2 97%   BMI 26.50 kg/m      09/24/2021    8:00 AM 08/01/2021    7:52 AM 07/02/2021    8:05 AM  BP/Weight  Systolic BP 426 834 196  Diastolic BP 56 75 68  Wt. (Lbs) 190 189.4 184  BMI 26.5 kg/m2 26.42 kg/m2 25.66 kg/m2    Physical Exam Constitutional:      Appearance: Normal appearance. He is normal weight.  HENT:     Right Ear: Tympanic membrane normal.     Left Ear: Tympanic membrane normal.     Nose: Nose normal.  Cardiovascular:     Rate and Rhythm: Normal rate and regular rhythm.     Pulses: Normal pulses.     Heart sounds: Normal heart sounds.  Pulmonary:     Effort: Pulmonary effort is normal.     Breath sounds: Normal breath sounds.  Abdominal:     General: Bowel sounds are normal.  Musculoskeletal:        General: Tenderness (lumbar) present.  Neurological:     Mental Status: He is oriented to person, place, and time. Mental status is at baseline.  Psychiatric:        Mood and Affect: Mood normal.        Behavior: Behavior normal.     Diabetic Foot Exam - Simple   No data filed      Lab Results  Component Value Date   WBC 4.4 07/02/2021   HGB 14.3 07/02/2021   HCT 41.8 07/02/2021   PLT 179 07/02/2021   GLUCOSE 96 07/02/2021   CHOL 103 09/24/2021   TRIG 79 09/24/2021   HDL 46 09/24/2021   LDLCALC 41 09/24/2021   ALT 19 07/02/2021   AST 19 07/02/2021   NA 140 07/02/2021   K 4.8 07/02/2021   CL 105 07/02/2021   CREATININE 0.82 07/02/2021   BUN 15 07/02/2021   CO2 21 07/02/2021    TSH 3.000 08/31/2019   INR 0.9 05/29/2020   HGBA1C 5.9 (H) 09/24/2021   MICROALBUR 30 07/11/2020      Assessment & Plan:   Problem List Items Addressed This Visit       Cardiovascular and Mediastinum   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 - Primary   Paroxysmal atrial fibrillation (HCC)   Hypertensive heart disease with chronic diastolic congestive heart failure (HCC)     Digestive   GERD (gastroesophageal reflux disease)   Relevant Medications   omeprazole (PRILOSEC) 40 MG capsule     Endocrine   Impaired fasting blood sugar   Relevant Orders   Hemoglobin A1c (Completed)     Other   Mixed hyperlipidemia   Relevant Orders   Lipid panel (Completed)  . Meds ordered this encounter  Medications   cyclobenzaprine (FLEXERIL) 5 MG tablet    Sig: Take 1 tablet (5 mg total) by mouth 3 (three) times daily as needed for muscle spasms.    Dispense:  30 tablet    Refill:  1   omeprazole (PRILOSEC) 40 MG capsule    Sig: Take 1 capsule (  40 mg total) by mouth daily.    Dispense:  90 capsule    Refill:  1    Orders Placed This Encounter  Procedures   Lipid panel   Hemoglobin A1c   Cardiovascular Risk Assessment     Follow-up: Return in about 3 months (around 12/25/2021) for chronic fasting, awv 01/18/2022.Marland Kitchen  An After Visit Summary was printed and given to the patient.  Rochel Brome, MD [facility] [phone]

## 2021-09-24 ENCOUNTER — Ambulatory Visit (INDEPENDENT_AMBULATORY_CARE_PROVIDER_SITE_OTHER): Payer: Medicare PPO | Admitting: Family Medicine

## 2021-09-24 ENCOUNTER — Encounter: Payer: Self-pay | Admitting: Family Medicine

## 2021-09-24 VITALS — BP 112/56 | HR 66 | Resp 18 | Ht 71.0 in | Wt 190.0 lb

## 2021-09-24 DIAGNOSIS — I480 Paroxysmal atrial fibrillation: Secondary | ICD-10-CM | POA: Diagnosis not present

## 2021-09-24 DIAGNOSIS — E782 Mixed hyperlipidemia: Secondary | ICD-10-CM

## 2021-09-24 DIAGNOSIS — I5032 Chronic diastolic (congestive) heart failure: Secondary | ICD-10-CM | POA: Diagnosis not present

## 2021-09-24 DIAGNOSIS — M62838 Other muscle spasm: Secondary | ICD-10-CM | POA: Diagnosis not present

## 2021-09-24 DIAGNOSIS — N3941 Urge incontinence: Secondary | ICD-10-CM

## 2021-09-24 DIAGNOSIS — R7301 Impaired fasting glucose: Secondary | ICD-10-CM

## 2021-09-24 DIAGNOSIS — I110 Hypertensive heart disease with heart failure: Secondary | ICD-10-CM

## 2021-09-24 DIAGNOSIS — K219 Gastro-esophageal reflux disease without esophagitis: Secondary | ICD-10-CM

## 2021-09-24 DIAGNOSIS — I2510 Atherosclerotic heart disease of native coronary artery without angina pectoris: Secondary | ICD-10-CM

## 2021-09-24 MED ORDER — OMEPRAZOLE 40 MG PO CPDR: 40.0000 mg | DELAYED_RELEASE_CAPSULE | Freq: Every day | ORAL | 1 refills | Status: DC

## 2021-09-24 MED ORDER — CYCLOBENZAPRINE HCL 5 MG PO TABS: 5.0000 mg | ORAL_TABLET | Freq: Three times a day (TID) | ORAL | 1 refills | Status: DC | PRN

## 2021-09-24 NOTE — Patient Instructions (Addendum)
Start omeprazole 40 mg daily. Continue pepcid (famotidine) for at least 2 weeks, then may discontinue pepcid.   Start on cyclobenzaprine 5 mg three times a day as needed muscle spasm.   Recommend shingrix vaccine series.

## 2021-09-25 LAB — LIPID PANEL
Chol/HDL Ratio: 2.2 ratio (ref 0.0–5.0)
Cholesterol, Total: 103 mg/dL (ref 100–199)
HDL: 46 mg/dL (ref >39)
LDL Chol Calc (NIH): 41 mg/dL (ref 0–99)
Triglycerides: 79 mg/dL (ref 0–149)
VLDL Cholesterol Cal: 16 mg/dL (ref 5–40)

## 2021-09-25 LAB — HEMOGLOBIN A1C
Est. average glucose Bld gHb Est-mCnc: 123 mg/dL
Hgb A1c MFr Bld: 5.9 % — ABNORMAL HIGH (ref 4.8–5.6)

## 2021-09-29 DIAGNOSIS — N3941 Urge incontinence: Secondary | ICD-10-CM | POA: Insufficient documentation

## 2021-09-29 DIAGNOSIS — R252 Cramp and spasm: Secondary | ICD-10-CM | POA: Insufficient documentation

## 2021-09-29 DIAGNOSIS — M62838 Other muscle spasm: Secondary | ICD-10-CM | POA: Insufficient documentation

## 2021-09-29 NOTE — Assessment & Plan Note (Signed)
Resolved

## 2021-09-29 NOTE — Assessment & Plan Note (Signed)
>>  ASSESSMENT AND PLAN FOR MUSCLE SPASM WRITTEN ON 09/29/2021  1:21 PM BY LEAL-BORJAS, Myrtle Haller I, CMA  Start on cyclobenzaprine 5 mg three times a day as needed muscle spasm.

## 2021-09-29 NOTE — Assessment & Plan Note (Signed)
Well controlled.  No changes to medicines. Amlodipine 5 mg daily, Aspirin 81 mg daily, Clonidine 0.1 mg daily PRN, Lisinopril 20 mg daily. Continue to work on eating a healthy diet and exercise.  Labs drawn today.  

## 2021-09-29 NOTE — Assessment & Plan Note (Signed)
The current medical regimen is effective;  continue present plan and medications. The left ventricular ejection fraction is 55-65% by visual estimate 

## 2021-09-29 NOTE — Assessment & Plan Note (Signed)
Well controlled.  No changes to medicines. Atorvastatin 40 mg daily, Fish Oil 1200 mg every morning. Continue to work on eating a healthy diet and exercise.  Labs drawn today.  

## 2021-09-29 NOTE — Assessment & Plan Note (Signed)
Start on cyclobenzaprine 5 mg three times a day as needed muscle spasm.

## 2021-09-29 NOTE — Assessment & Plan Note (Signed)
Hemoglobin A1c 6.3%, 3 month avg of blood sugars, is in prediabetic range.  In order to prevent progression to diabetes, recommend low carb diet and regular exercise and continue with Metformin 500 mg daily.

## 2021-09-29 NOTE — Assessment & Plan Note (Signed)
Start omeprazole 40 mg daily. Continue pepcid (famotidine) for at least 2 weeks, then may discontinue pepcid.

## 2021-10-02 DIAGNOSIS — C678 Malignant neoplasm of overlapping sites of bladder: Secondary | ICD-10-CM | POA: Diagnosis not present

## 2021-10-03 DIAGNOSIS — M5416 Radiculopathy, lumbar region: Secondary | ICD-10-CM | POA: Diagnosis not present

## 2021-10-07 ENCOUNTER — Telehealth: Payer: Self-pay | Admitting: *Deleted

## 2021-10-07 NOTE — Patient Outreach (Signed)
  Care Coordination   Initial Visit Note   10/07/2021 Name: [name] MRN: [id number] DOB: 09/01/1940  [name] is a 81 y.o. year old male who sees Cox, Kirsten, MD for primary care. I spoke with  [name] by phone today.  What matters to the patients health and wellness today?  Pt declines Care Coordination needs at this time. Reminded him due for AWV visit with PCP.    Goals Addressed   None     SDOH assessments and interventions completed:  No     Care Coordination Interventions Activated:  No  Care Coordination Interventions:  No, not indicated   Follow up plan: No further intervention required.   Encounter Outcome:  Pt. Refused   Eduard Clos MSW, LCSW Licensed Clinical Social Worker      [phone]

## 2021-10-22 ENCOUNTER — Encounter: Payer: Self-pay | Admitting: Cardiology

## 2021-10-22 ENCOUNTER — Ambulatory Visit: Payer: Medicare PPO | Attending: Cardiology | Admitting: Cardiology

## 2021-10-22 VITALS — BP 110/66 | HR 74 | Ht 71.0 in | Wt 187.8 lb

## 2021-10-22 DIAGNOSIS — C678 Malignant neoplasm of overlapping sites of bladder: Secondary | ICD-10-CM | POA: Diagnosis not present

## 2021-10-22 DIAGNOSIS — I5032 Chronic diastolic (congestive) heart failure: Secondary | ICD-10-CM

## 2021-10-22 DIAGNOSIS — I2510 Atherosclerotic heart disease of native coronary artery without angina pectoris: Secondary | ICD-10-CM | POA: Diagnosis not present

## 2021-10-22 DIAGNOSIS — I110 Hypertensive heart disease with heart failure: Secondary | ICD-10-CM

## 2021-10-22 DIAGNOSIS — Z981 Arthrodesis status: Secondary | ICD-10-CM

## 2021-10-22 DIAGNOSIS — C61 Malignant neoplasm of prostate: Secondary | ICD-10-CM | POA: Diagnosis not present

## 2021-10-22 NOTE — Patient Instructions (Signed)

## 2021-10-22 NOTE — Progress Notes (Unsigned)
Cardiology Office Note:    Date:  10/22/2021   ID:  [name], DOB September 14, 1940, MRN [id number]  PCP:  Rochel Brome, MD  Cardiologist:  Jenne Campus, MD    Referring MD: Rochel Brome, MD   Chief Complaint  Patient presents with   Follow-up    History of Present Illness:    [name] is a 81 y.o. male  with past medical history significant for coronary artery disease, in June 2021 he had cardiac catheterization done which showed 30% stenosis of circumflex, 40% stenosis of proximal to mid LAD, ejection fraction was normal at that time.  He was also told to have aortic aneurysm after echocardiogram showing diameter 40 mm after that CT has been performed which showed normal size of the aorta.  He also have dyslipidemia.  I do see diagnosis of atrial fibrillation but I see no documentation for it. He comes today to my office for follow-up.  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  Cardiac wise no palpitations no chest pain tightness squeezing pressure burning chest.  Past Medical History:  Diagnosis Date   Angina pectoris ([place]) 08/02/2019   Arthritis    on meds   Atrial fibrillation ([place])    Backache 08/17/2012   Body mass index (BMI) 26.0-26.9, adult 05/08/2020   Cancer ([place]) 10/2015   prostate cancer treated with radiation   Carpal tunnel syndrome 01/18/2018   Cataract    sx-bilaterally   Cervical spondylosis without myelopathy 09/24/2015   Chest tightness 06/28/2019   Chronic back pain 05/30/2013   Chronic low back pain 02/20/2014   Colitis 11/22/2018   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 06/17/2016   Deficiency of other specified B group vitamins 05/20/2019   Depression 04/04/2019   Diabetes mellitus due to underlying condition with unspecified complications ([place]) 8/33/8250   Diabetes  mellitus without complication ([place])    Type II   Diarrhea 11/21/2018   Dyslipidemia 06/17/2016   Dyspnea on exertion 06/28/2019   Dysrhythmia    Essential (primary) hypertension 06/17/2016   Essential hypertension 06/17/2016   GERD (gastroesophageal reflux disease)    on meds   Greater trochanteric pain syndrome 07/03/2020   Headache    History of hiatal hernia    History of kidney stones    History of prostate cancer 05/20/2019   Hypercholesteremia 04/04/2019   Hyperlipemia    Hypertension    Impaired fasting blood sugar 05/20/2019   Impingement syndrome of shoulder region 01/12/2018   Impingement syndrome of shoulder region 01/12/2018   Low back pain 02/20/2014   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   Lumbar radiculopathy 05/24/2019   Malignant neoplasm prostate ([place]) 05/20/2019   Medicare annual wellness visit, subsequent 01/16/2020   Mixed hyperlipidemia 08/31/2019   Neck pain 07/03/2015   Paresthesia of hand 01/12/2018   Paresthesia of upper limb 01/12/2018   Paroxysmal atrial fibrillation ([place]) 04/04/2019   PONV (postoperative nausea and vomiting)    Pyelonephritis 06/08/2021   S/P cervical spinal fusion 03/04/2018   S/P lumbar fusion 12/09/2019   S/P lumbar spinal fusion 09/29/2013   S/P lumbar spinal fusion 09/29/2013   Sepsis ([place]) 11/22/2018   Shoulder pain 01/12/2018   Spinal 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 01/26/2018   Stenosis of intervertebral foramina 01/12/2018   Trochanteric bursitis of right hip 04/19/2020    Past Surgical History:  Procedure Laterality Date   anal fissures     ANTERIOR CERVICAL DECOMP/DISCECTOMY FUSION N/A 03/04/2018   Procedure: Anterior Cervical Decompression Fusion - Cervical three-Cervical four - Cervical four-Cervical five;  Surgeon: Eustace Moore, MD;  Location: [facility];  Service: Neurosurgery;  Laterality: N/A;   ANTERIOR LAT LUMBAR FUSION N/A 05/30/2020   Procedure: Anterior Lateral Lumbar Fusion Lumbar One-Two with Lateral Plate Removal of pedicle screws Lumbar One  and Posterior Lateral Fusion Lumbar One-Lumbar Three;  Surgeon: Eustace Moore, MD;  Location: [facility];  Service: Neurosurgery;  Laterality: N/A;   BACK SURGERY     CARDIAC CATHETERIZATION     no PCI   CARPAL TUNNEL RELEASE Left 03/04/2018   Procedure: Carpal Tunnel Release - left;  Surgeon: Eustace Moore, MD;  Location: [facility];  Service: Neurosurgery;  Laterality: Left;   CHOLECYSTECTOMY     COLONOSCOPY W/ POLYPECTOMY  08/30/2012   Colonic polyp status post polypectomy.  Pancolonic diverticulosis predominantly in the sigmoid colon. Small internal hemorrhoids. Moderate internal hemorhroids.    ESOPHAGOGASTRODUODENOSCOPY  08/04/2016   Schatzkis ring status post esophageal dilatation. Small hiatal hernia. Mild gastritis.    EYE SURGERY     cataract removal bilateral eyes   FOOT SURGERY     left heel   HARDWARE REMOVAL N/A 05/30/2020   Procedure: HARDWARE REMOVAL;  Surgeon: Eustace Moore, MD;  Location: [facility];  Service: Neurosurgery;  Laterality: N/A;   HERNIA REPAIR     LAMINECTOMY WITH POSTERIOR LATERAL ARTHRODESIS LEVEL 2 N/A 12/09/2019   Procedure: Laminectomy and Foraminotomy - Lumbar one-two, Lumbar two-three, posterolateral instrumented fusion Lumbar one-three, removal of instrumentation Lumbar three-five;  Surgeon: Eustace Moore, MD;  Location: [facility];  Service: Neurosurgery;  Laterality: N/A;   LEFT HEART CATH AND CORONARY ANGIOGRAPHY N/A 08/03/2019   Procedure: LEFT HEART CATH AND CORONARY ANGIOGRAPHY;  Surgeon: Jettie Booze, MD;  Location: [facility];  Service: Cardiovascular;  Laterality: N/A;   SHOULDER SURGERY     bilateral   WISDOM TOOTH EXTRACTION      Current Medications: Current Meds  Medication Sig   amLODipine (NORVASC) 5 MG tablet Take 5 mg by mouth daily.   ASPIRIN LOW DOSE 81 MG EC tablet TAKE 1 TABLET BY MOUTH EVERY DAY (Patient taking differently: Take 81 mg by mouth daily.)   atorvastatin (LIPITOR) 40 MG tablet TAKE 1 TABLET EVERY DAY  (NEW DOSE)  (Patient taking differently: Take 40 mg by mouth daily.)   carboxymethylcellulose (REFRESH PLUS) 0.5 % SOLN Place 1 drop into both eyes 3 (three) times daily as needed for dry eyes (dry/irritated eyes).   cholecalciferol (VITAMIN D) 1000 UNITS tablet Take 1,000 Units by mouth in the morning.   cloNIDine (CATAPRES) 0.1 MG tablet Take 0.1 mg by mouth as needed (Take when sbp 170).   cyclobenzaprine (FLEXERIL) 5 MG tablet Take 1 tablet (5 mg total) by mouth 3 (three) times daily as needed for muscle spasms.   famotidine (PEPCID) 40 MG tablet Take 1 tablet (40 mg total) by mouth daily.   lisinopril (ZESTRIL) 20 MG tablet Take 1 tablet (20 mg total) by mouth daily.   metFORMIN (GLUCOPHAGE) 500 MG tablet Take 1 tablet (500 mg total) by mouth daily.   nitroGLYCERIN (NITROSTAT) 0.4 MG SL tablet Place 1 tablet (0.4 mg total) under the tongue every 5 (five) minutes x 3 doses as needed for chest pain.   Omega-3 Fatty Acids (FISH OIL) 1200 MG CAPS Take 1,200 mg  by mouth in the morning.   omeprazole (PRILOSEC) 40 MG capsule Take 1 capsule (40 mg total) by mouth daily.   oxybutynin (DITROPAN-XL) 5 MG 24 hr tablet Take 5 mg by mouth daily.   tamsulosin (FLOMAX) 0.4 MG CAPS capsule Take 0.4 mg by mouth daily after supper.     Allergies:   Oxycodone   Social History   Socioeconomic History   Marital status: Widowed    Spouse name: Not on file   Number of children: 2   Years of education: Not on file   Highest education level: Not on file  Occupational History   Not on file  Tobacco Use   Smoking status: Former    Packs/day: 0.50    Years: 35.00    Total pack years: 17.50    Types: Cigarettes    Quit date: 02/10/1993    Years since quitting: 28.7   Smokeless tobacco: Former    Types: Chew    Quit date: 02/10/2006  Vaping Use   Vaping Use: Never used  Substance and Sexual Activity   Alcohol use: Not Currently    Alcohol/week: 1.0 standard drink of alcohol    Types: 1 Cans of beer per week     Comment: occ   Drug use: No   Sexual activity: Yes    Partners: Female  Other Topics Concern   Not on file  Social History Narrative   Not on file   Social Determinants of Health   Financial Resource Strain: Low Risk  (01/17/2021)   Overall Financial Resource Strain (CARDIA)    Difficulty of Paying Living Expenses: Not hard at all  Food Insecurity: No Food Insecurity (01/17/2021)   Hunger Vital Sign    Worried About Running Out of Food in the Last Year: Never true    [place] in the Last Year: Never true  Transportation Needs: No Transportation Needs (01/17/2021)   PRAPARE - Hydrologist (Medical): No    Lack of Transportation (Non-Medical): No  Physical Activity: Inactive (01/17/2021)   Exercise Vital Sign    Days of Exercise per Week: 0 days    Minutes of Exercise per Session: 0 min  Stress: No Stress Concern Present (01/17/2021)   [place]    Feeling of Stress : Not at all  Social Connections: Moderately Isolated (01/17/2021)   Social Connection and Isolation Panel [NHANES]    Frequency of Communication with Friends and Family: More than three times a week    Frequency of Social Gatherings with Friends and Family: Three times a week    Attends Religious Services: More than 4 times per year    Active Member of Clubs or Organizations: No    Attends Archivist Meetings: Never    Marital Status: Widowed     Family History: The patient's family history is negative for Colon cancer, Esophageal cancer, Rectal cancer, Stomach cancer, and Colon polyps. ROS:   Please see the history of present illness.    All 14 point review of systems negative except as described per history of present illness  EKGs/Labs/Other Studies Reviewed:      Recent Labs: 07/02/2021: ALT 19; BUN 15; Creatinine, Ser 0.82; Hemoglobin 14.3; Platelets 179; Potassium 4.8; Sodium 140  Recent Lipid  Panel    Component Value Date/Time   CHOL 103 09/24/2021 0842   TRIG 79 09/24/2021 0842   HDL 46 09/24/2021 0842  CHOLHDL 2.2 09/24/2021 0842   CHOLHDL 2.6 01/24/2017 0521   VLDL 15 01/24/2017 0521   LDLCALC 41 09/24/2021 0842    Physical Exam:    VS:  BP 110/66 (BP Location: Left Arm, Patient Position: Sitting)   Pulse 74   Ht '5\' 11"'$  (1.803 m)   Wt 187 lb 12.8 oz (85.2 kg)   SpO2 90%   BMI 26.19 kg/m     Wt Readings from Last 3 Encounters:  10/22/21 187 lb 12.8 oz (85.2 kg)  09/24/21 190 lb (86.2 kg)  08/01/21 189 lb 6.4 oz (85.9 kg)     GEN:  Well nourished, well developed in no acute distress HEENT: Normal NECK: No JVD; No carotid bruits LYMPHATICS: No lymphadenopathy CARDIAC: RRR, no murmurs, no rubs, no gallops RESPIRATORY:  Clear to auscultation without rales, wheezing or rhonchi  ABDOMEN: Soft, non-tender, non-distended MUSCULOSKELETAL:  No edema; No deformity  SKIN: Warm and dry LOWER EXTREMITIES: no swelling NEUROLOGIC:  Alert and oriented x 3 PSYCHIATRIC:  Normal affect   ASSESSMENT:    1. Coronary artery disease involving native coronary artery of native heart without angina pectoris   2. Hypertensive heart disease with chronic diastolic congestive heart failure ([place])   3. S/P lumbar fusion   4. Malignant neoplasm of overlapping sites of bladder ([place])   5. Malignant neoplasm prostate ([place])    PLAN:    In order of problems listed above:  Coronary artery disease: Stable from that point review we will continue present management which include antiplatelets therapy Essential hypertension, blood pressure well controlled continue present management Dyslipidemia: I did review K PN which show me his LDL of 41 HDL 46.  He is on Lipitor 40 which I will continue. Monitor implant cancer of her bladder and prostate.  We will do follow-up excellently by oncology team from urology department.   Medication Adjustments/Labs and Tests Ordered: Current medicines  are reviewed at length with the patient today.  Concerns regarding medicines are outlined above.  No orders of the defined types were placed in this encounter.  Medication changes: No orders of the defined types were placed in this encounter.   Signed, Park Liter, MD, [facility] 10/22/2021 3:27 PM    [place]

## 2021-10-30 DIAGNOSIS — N200 Calculus of kidney: Secondary | ICD-10-CM | POA: Diagnosis not present

## 2021-10-30 DIAGNOSIS — R7303 Prediabetes: Secondary | ICD-10-CM | POA: Diagnosis not present

## 2021-10-30 DIAGNOSIS — K219 Gastro-esophageal reflux disease without esophagitis: Secondary | ICD-10-CM | POA: Diagnosis not present

## 2021-10-30 DIAGNOSIS — M5136 Other intervertebral disc degeneration, lumbar region: Secondary | ICD-10-CM | POA: Diagnosis not present

## 2021-10-30 DIAGNOSIS — Z8546 Personal history of malignant neoplasm of prostate: Secondary | ICD-10-CM | POA: Diagnosis not present

## 2021-10-30 DIAGNOSIS — E785 Hyperlipidemia, unspecified: Secondary | ICD-10-CM | POA: Diagnosis not present

## 2021-10-30 DIAGNOSIS — I2510 Atherosclerotic heart disease of native coronary artery without angina pectoris: Secondary | ICD-10-CM | POA: Diagnosis not present

## 2021-10-30 DIAGNOSIS — I4891 Unspecified atrial fibrillation: Secondary | ICD-10-CM | POA: Diagnosis not present

## 2021-10-30 DIAGNOSIS — I10 Essential (primary) hypertension: Secondary | ICD-10-CM | POA: Diagnosis not present

## 2021-10-30 DIAGNOSIS — N201 Calculus of ureter: Secondary | ICD-10-CM | POA: Diagnosis not present

## 2021-11-27 DIAGNOSIS — C678 Malignant neoplasm of overlapping sites of bladder: Secondary | ICD-10-CM | POA: Diagnosis not present

## 2021-11-29 ENCOUNTER — Other Ambulatory Visit: Payer: Self-pay | Admitting: Family Medicine

## 2021-12-02 IMAGING — RF DG LUMBAR SPINE 2-3V
1 series · 2 of 2 positions shown · non-contrast
Comparison: CT 11/21/2019, radiograph 05/24/2019

CLINICAL DATA: Laminectomy and foraminotomy L1-2, L2-3, hardware
removal L3-5

EXAM:
DG C-ARM 1-60 MIN; LUMBAR SPINE - 2-3 VIEW
FLUOROSCOPY TIME:  Fluoroscopy Time:  37 seconds
Radiation Exposure Index (if provided by the fluoroscopic device):
19.37 mGy
Number of Acquired Spot Images: 2

[Series 1: run · 2 of 2 slices shown]
[im 1/2]
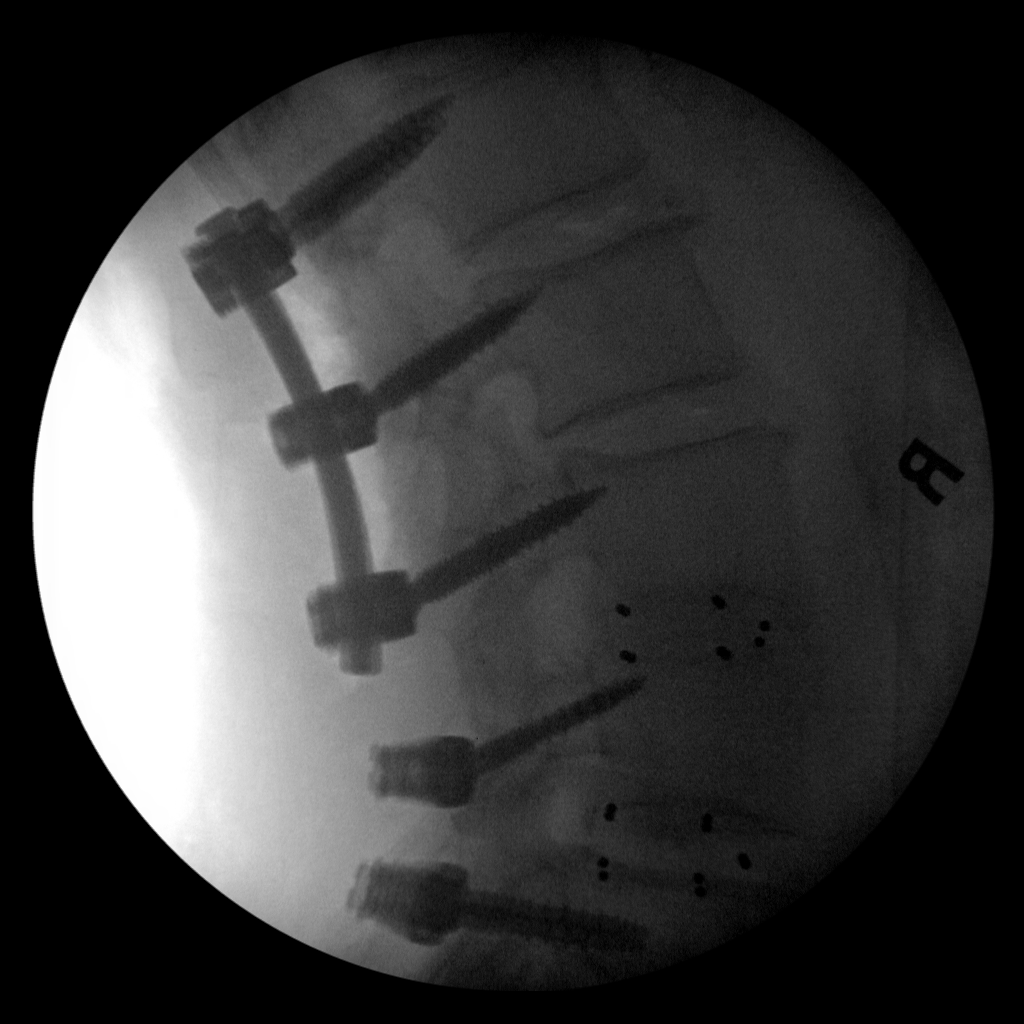
[im 2/2]
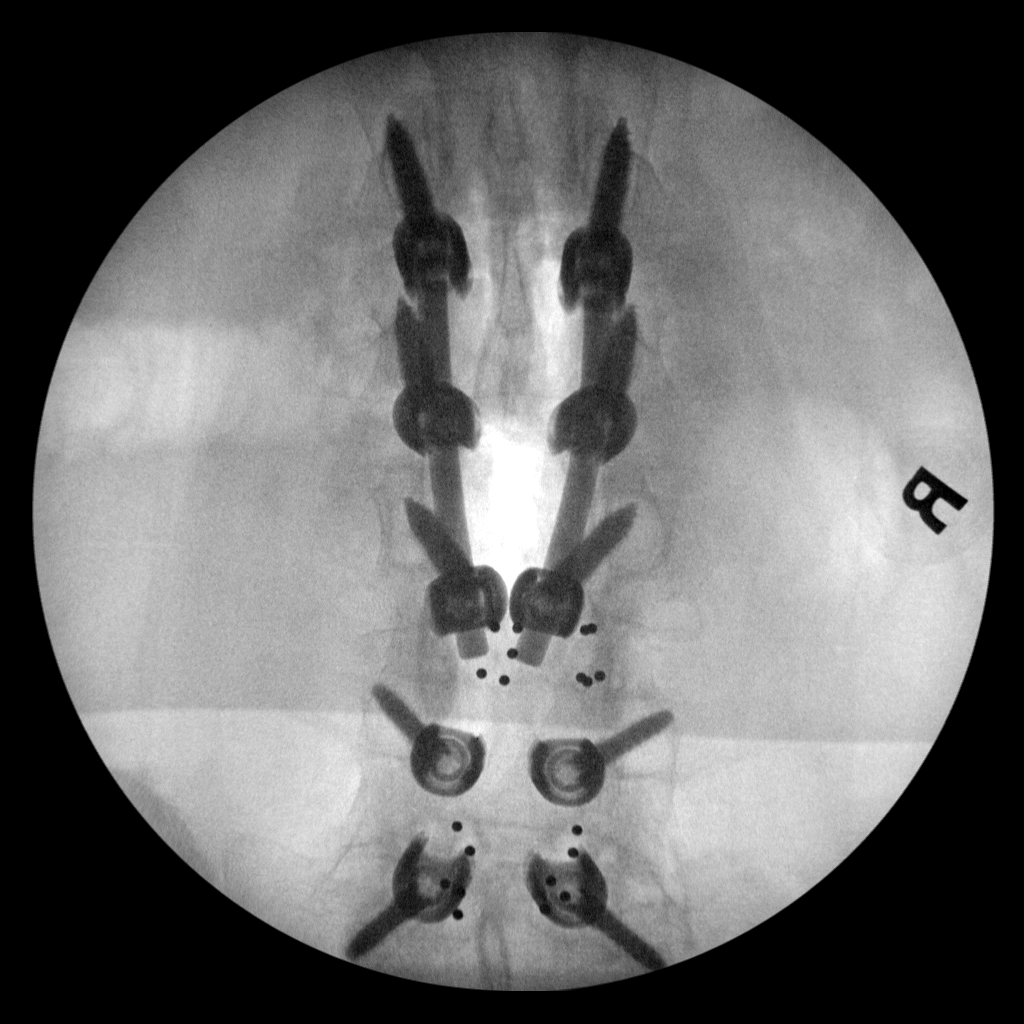

[2 of 2 positions shown; findings below may reference images not displayed]

FINDINGS: Frontal and lateral fluoroscopic images of the lumbar spine depict
interval posterior decompression L1-L3 with placement of bilateral
transpedicular screws and posterior fusion rods without acute
hardware complication. There has been interval removal of the fusion
rods previously spanning the bilateral transpedicular screws L3-L5.
Interbody spacers are again seen L3-4, L4-5. No acute complication
is evident. Previously seen loosening of the L5 transpedicular screw
is less well visualized. Redemonstrated subsidence of the interbody
spacer L4-5. Multilevel discogenic changes are similar to prior.
Some mild retrolisthesis L1 on L2 and L2 on L3 is similar to
comparison.
IMPRESSION: Fluoroscopic guidance for interval posterior decompression L1-L3 and
posterior fusion with removal of previously seen fusion rods
spanning L3-L5. No acute hardware complication.

Redemonstrated subsidence of the interbody spacer at L4-5.
Previously seen loosening of the L5 screw is less well visualized.

## 2021-12-25 ENCOUNTER — Other Ambulatory Visit: Payer: Self-pay | Admitting: Nurse Practitioner

## 2021-12-25 DIAGNOSIS — I10 Essential (primary) hypertension: Secondary | ICD-10-CM

## 2021-12-25 DIAGNOSIS — C678 Malignant neoplasm of overlapping sites of bladder: Secondary | ICD-10-CM | POA: Diagnosis not present

## 2021-12-30 ENCOUNTER — Ambulatory Visit: Payer: Medicare PPO | Admitting: Family Medicine

## 2022-01-06 NOTE — Assessment & Plan Note (Signed)
Well controlled.  No changes to medicines. Atorvastatin 40 mg daily, Fish Oil 1200 mg every morning. Continue to work on eating a healthy diet and exercise.  Labs drawn today.

## 2022-01-06 NOTE — Progress Notes (Signed)
Subjective:  Patient ID: [name], male    DOB: 1940/09/09  Age: 81 y.o. MRN: [id number]  Chief Complaint  Patient presents with   Hypertension   Hyperlipidemia   HPI: Prediabetes: Taking Metformin 500 mg daily. Last a1c 5.9.   GERD: Taking Omeprazole 40 mg daily.   Hyperlipidemia: Current medications: Atorvastatin 40 mg daily, Fish Oil 1200 mg every morning.  Hypertension: Current medications: Amlodipine 5 mg daily, Aspirin 81 mg daily, Clonidine 0.1 mg daily AS NEEDED if over 170. Lisinopril 20 mg daily.  Bladder cancer: Patient was seeing Dr. Warrick Parisian, but is being transferred to another urologist.  1. 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 2. 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  Has been having chemo installations into bladder 1 times in a month and then repeat cystoscopy.  Dr. 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 Dr Johnston Ebbs is urologist.   Current Outpatient Medications on File Prior to Visit  Medication Sig Dispense Refill   amLODipine (NORVASC) 5 MG tablet Take 5 mg by mouth daily.     atorvastatin (LIPITOR) 40 MG tablet TAKE 1 TABLET EVERY DAY  (NEW DOSE) (Patient taking differently: Take 40 mg by mouth daily.) 90 tablet 1   carboxymethylcellulose (REFRESH PLUS) 0.5 % SOLN Place 1 drop into both eyes 3 (three) times daily as needed for dry eyes (dry/irritated eyes).     cholecalciferol (VITAMIN D) 1000 UNITS tablet Take 1,000 Units by mouth in the morning.     cloNIDine (CATAPRES) 0.1 MG tablet Take 0.1 mg by mouth as needed (Take when sbp 170).     cyclobenzaprine (FLEXERIL) 5 MG tablet Take 1 tablet (5 mg total) by mouth 3 (three) times daily as needed for muscle spasms. 30 tablet 1   lisinopril (ZESTRIL) 20 MG tablet TAKE 1 TABLET BY MOUTH EVERY DAY 90 tablet 1   metFORMIN (GLUCOPHAGE) 500 MG tablet Take 1 tablet (500 mg total) by mouth daily. 90 tablet 1   nitroGLYCERIN (NITROSTAT) 0.4 MG SL tablet Place 1 tablet (0.4  mg total) under the tongue every 5 (five) minutes x 3 doses as needed for chest pain. 25 tablet 12   Omega-3 Fatty Acids (FISH OIL) 1200 MG CAPS Take 1,200 mg by mouth in the morning.     omeprazole (PRILOSEC) 40 MG capsule Take 1 capsule (40 mg total) by mouth daily. 90 capsule 1   oxybutynin (DITROPAN-XL) 5 MG 24 hr tablet Take 5 mg by mouth daily.     tamsulosin (FLOMAX) 0.4 MG CAPS capsule Take 0.4 mg by mouth daily after supper.  1   No current facility-administered medications on file prior to visit.   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 06/17/2016   Deficiency of other specified B group vitamins 05/20/2019   Depression 04/04/2019   Diabetes mellitus due to underlying condition with unspecified complications ([place]) 1/44/8185   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   Diarrhea 11/21/2018   Dyslipidemia 06/17/2016   Dyspnea on exertion 06/28/2019   Dysrhythmia    Essential (primary) hypertension 06/17/2016   Essential hypertension 06/17/2016   GERD (gastroesophageal reflux disease)    on meds   Greater trochanteric pain syndrome 07/03/2020   Headache    History of hiatal hernia    History of kidney stones    History of prostate cancer 05/20/2019   Hypercholesteremia 04/04/2019   Hyperlipemia    Hypertension    Impaired fasting blood sugar 05/20/2019   Impingement syndrome of shoulder region 01/12/2018   Impingement syndrome of shoulder  region 01/12/2018   Low back pain 02/20/2014   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   Lumbar radiculopathy 05/24/2019   Malignant neoplasm prostate ([place]) 05/20/2019   Medicare annual wellness visit, subsequent 01/16/2020   Mixed hyperlipidemia 08/31/2019   Neck pain 07/03/2015   Paresthesia of hand 01/12/2018   Paresthesia of upper limb 01/12/2018   Paroxysmal atrial fibrillation ([place]) 04/04/2019   PONV (postoperative nausea and vomiting)    Pyelonephritis 06/08/2021   S/P cervical spinal fusion 03/04/2018   S/P lumbar fusion 12/09/2019   S/P lumbar spinal fusion 09/29/2013   S/P lumbar spinal fusion 09/29/2013   Sepsis ([place]) 11/22/2018   Shoulder pain 01/12/2018   Spinal 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 01/26/2018   Stenosis of intervertebral foramina 01/12/2018   Trochanteric bursitis of right hip 04/19/2020   Past Surgical History:  Procedure Laterality Date   anal fissures     ANTERIOR CERVICAL DECOMP/DISCECTOMY FUSION N/A 03/04/2018   Procedure: Anterior Cervical Decompression Fusion - Cervical three-Cervical four - Cervical four-Cervical five;  Surgeon: Eustace [facility], MD;  Location: [facility];  Service: Neurosurgery;  Laterality: N/A;   ANTERIOR LAT LUMBAR FUSION N/A 05/30/2020   Procedure: Anterior Lateral Lumbar Fusion Lumbar One-Two with Lateral Plate Removal of pedicle screws Lumbar One and Posterior Lateral Fusion Lumbar One-Lumbar Three;  Surgeon: Eustace [facility], MD;  Location: [facility];  Service: Neurosurgery;  Laterality: N/A;   BACK SURGERY     CARDIAC CATHETERIZATION     no PCI   CARPAL TUNNEL RELEASE Left 03/04/2018   Procedure: Carpal Tunnel Release - left;  Surgeon: Eustace [facility], MD;  Location: [name];  Service: Neurosurgery;  Laterality: Left;   CHOLECYSTECTOMY     COLONOSCOPY W/ POLYPECTOMY  08/30/2012   Colonic polyp status post polypectomy.  Pancolonic diverticulosis predominantly in the sigmoid colon. Small internal hemorrhoids. Moderate internal hemorhroids.    ESOPHAGOGASTRODUODENOSCOPY   08/04/2016   Schatzkis ring status post esophageal dilatation. Small hiatal hernia. Mild gastritis.    EYE SURGERY     cataract removal bilateral eyes   FOOT SURGERY     left heel   HARDWARE REMOVAL N/A 05/30/2020   Procedure: HARDWARE REMOVAL;  Surgeon: Eustace [facility], MD;  Location: [facility];  Service: Neurosurgery;  Laterality: N/A;   HERNIA REPAIR     LAMINECTOMY WITH POSTERIOR LATERAL ARTHRODESIS LEVEL 2 N/A 12/09/2019   Procedure: Laminectomy and Foraminotomy - Lumbar one-two, Lumbar two-three, posterolateral instrumented fusion Lumbar one-three, removal of instrumentation Lumbar three-five;  Surgeon: Eustace [facility], MD;  Location: [facility];  Service: Neurosurgery;  Laterality: N/A;   LEFT HEART CATH AND CORONARY ANGIOGRAPHY N/A 08/03/2019   Procedure: LEFT HEART CATH AND CORONARY ANGIOGRAPHY;  Surgeon: Jettie Booze, MD;  Location: [facility];  Service: Cardiovascular;  Laterality: N/A;   SHOULDER SURGERY     bilateral  WISDOM TOOTH EXTRACTION      Family History  Problem Relation Age of Onset   Colon cancer Neg Hx    Esophageal cancer Neg Hx    Rectal cancer Neg Hx    Stomach cancer Neg Hx    Colon polyps Neg Hx    Social History   Socioeconomic History   Marital status: Widowed    Spouse name: Not on file   Number of children: 2   Years of education: Not on file   Highest education level: Not on file  Occupational History   Not on file  Tobacco Use   Smoking status: Former    Packs/day: 0.50    Years: 35.00    Total pack years: 17.50    Types: Cigarettes    Quit date: 02/10/1993    Years since quitting: 28.9   Smokeless tobacco: Former    Types: Chew    Quit date: 02/10/2006  Vaping Use   Vaping Use: Never used  Substance and Sexual Activity   Alcohol use: Not Currently    Alcohol/week: 1.0 standard drink of alcohol    Types: 1 Cans of beer per week    Comment: occ   Drug use: No   Sexual activity: Yes    Partners: Female  Other Topics Concern    Not on file  Social History Narrative   Not on file   Social Determinants of Health   Financial Resource Strain: Low Risk  (01/17/2021)   Overall Financial Resource Strain (CARDIA)    Difficulty of Paying Living Expenses: Not hard at all  Food Insecurity: No Food Insecurity (01/17/2021)   Hunger Vital Sign    Worried About Running Out of Food in the Last Year: Never true    Luna in the Last Year: Never true  Transportation Needs: No Transportation Needs (01/17/2021)   PRAPARE - Hydrologist (Medical): No    Lack of Transportation (Non-Medical): No  Physical Activity: Inactive (01/17/2021)   Exercise Vital Sign    Days of Exercise per Week: 0 days    Minutes of Exercise per Session: 0 min  Stress: No Stress Concern Present (01/17/2021)   [place]    Feeling of Stress : Not at all  Social Connections: Moderately Isolated (01/17/2021)   Social Connection and Isolation Panel [NHANES]    Frequency of Communication with Friends and Family: More than three times a week    Frequency of Social Gatherings with Friends and Family: Three times a week    Attends Religious Services: More than 4 times per year    Active Member of Clubs or Organizations: No    Attends Archivist Meetings: Never    Marital Status: Widowed    Review of Systems  Constitutional:  Negative for appetite change, fatigue and fever.  HENT:  Negative for congestion, ear pain, sinus pressure and sore throat.   Respiratory:  Negative for cough, chest tightness, shortness of breath and wheezing.   Cardiovascular:  Negative for chest pain and palpitations.  Gastrointestinal:  Negative for abdominal pain, constipation, diarrhea, nausea and vomiting.  Genitourinary:  Positive for dysuria (burning and discomfort, currently on UTI medicine, will repeat urinalysis) and urgency. Negative for hematuria.   Musculoskeletal:  Negative for arthralgias, back pain, joint swelling and myalgias.  Skin:  Negative for rash.  Neurological:  Negative for dizziness, weakness and headaches.  Psychiatric/Behavioral:  Negative for  dysphoric mood. The patient is not nervous/anxious.      Objective:  BP 122/70   Pulse 72   Temp (!) 96.4 F (35.8 C)   Resp 18   Ht '5\' 11"'$  (1.803 m)   Wt 192 lb (87.1 kg)   BMI 26.78 kg/m      01/07/2022    7:35 AM 01/07/2022    7:29 AM 10/22/2021    3:12 PM  BP/Weight  Systolic BP 818 299 371  Diastolic BP 70 76 66  Wt. (Lbs)  192 187.8  BMI  26.78 kg/m2 26.19 kg/m2    Physical Exam Vitals reviewed.  Constitutional:      Appearance: Normal appearance. He is normal weight.  Neck:     Vascular: No carotid bruit.  Cardiovascular:     Rate and Rhythm: Normal rate and regular rhythm.     Heart sounds: Normal heart sounds.  Pulmonary:     Effort: Pulmonary effort is normal.     Breath sounds: Normal breath sounds.  Abdominal:     General: Abdomen is flat. Bowel sounds are normal.     Palpations: Abdomen is soft.     Tenderness: There is abdominal tenderness (slight suprapubic tenderness).  Neurological:     Mental Status: He is alert and oriented to person, place, and time.  Psychiatric:        Mood and Affect: Mood normal.        Behavior: Behavior normal.     Diabetic Foot Exam - Simple   Simple Foot Form Diabetic Foot exam was performed with the following findings: Yes 01/07/2022 10:33 AM  Visual Inspection No deformities, no ulcerations, no other skin breakdown bilaterally: Yes Sensation Testing Intact to touch and monofilament testing bilaterally: Yes Pulse Check Posterior Tibialis and Dorsalis pulse intact bilaterally: Yes Comments      Lab Results  Component Value Date   WBC 5.6 01/07/2022   HGB 16.1 01/07/2022   HCT 47.4 01/07/2022   PLT 178 01/07/2022   GLUCOSE 114 (H) 01/07/2022   CHOL 132 01/07/2022   TRIG 122 01/07/2022    HDL 50 01/07/2022   LDLCALC 60 01/07/2022   ALT 22 01/07/2022   AST 20 01/07/2022   NA 139 01/07/2022   K 5.0 01/07/2022   CL 100 01/07/2022   CREATININE 1.01 01/07/2022   BUN 17 01/07/2022   CO2 21 01/07/2022   TSH 3.000 08/31/2019   INR 0.9 05/29/2020   HGBA1C 6.1 (H) 01/07/2022   MICROALBUR 30 07/11/2020      Assessment & Plan:   Problem List Items Addressed This Visit       Cardiovascular and Mediastinum   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 The left ventricular ejection fraction is 55-65% by visual estimate      Relevant Medications   aspirin EC (ASPIRIN LOW DOSE) 81 MG tablet   Other Relevant Orders   Cardiovascular Risk Assessment (Completed)   Hypertensive heart disease with chronic diastolic congestive heart failure ([place])    Well controlled.  No changes to medicines. Amlodipine 5 mg daily, Aspirin 81 mg daily, Clonidine 0.1 mg daily PRN, Lisinopril 20 mg daily. Continue to work on eating a healthy diet and exercise.  Labs drawn today.       Relevant Medications   aspirin EC (ASPIRIN LOW DOSE) 81 MG tablet   Other Relevant Orders  CBC With Diff/Platelet (Completed)   Comprehensive metabolic panel (Completed)     Digestive   GERD (gastroesophageal reflux disease)    Continue Omeprazole 40 mg daily.        Endocrine   Impaired fasting blood sugar    Hemoglobin A1c 5.9%, 3 month avg of blood sugars, is in prediabetic range.  In order to prevent progression to diabetes, recommend low carb diet and regular exercise and continue with Metformin 500 mg daily.       Relevant Orders   Hemoglobin A1c (Completed)     Genitourinary   Malignant neoplasm of overlapping sites of bladder [facility])    Prescription: pyridium. Discuss with oncology to see if pyridium will help with dysuria which occurs after receives chemo.   Management per specialist.  Sees Dr. Dyanne Iha and Dr. Pascal Lux.       Relevant Medications   phenazopyridine (PYRIDIUM) 200 MG tablet   aspirin EC (ASPIRIN LOW DOSE) 81 MG tablet     Other   Mixed hyperlipidemia    Well controlled.  No changes to medicines. Atorvastatin 40 mg daily, Fish Oil 1200 mg every morning. Continue to work on eating a healthy diet and exercise.  Labs drawn today.       Relevant Medications   aspirin EC (ASPIRIN LOW DOSE) 81 MG tablet   Other Relevant Orders   CBC With Diff/Platelet (Completed)   Lipid panel (Completed)   Urge incontinence    Continue oxybutynin and flomax.      Encounter for immunization   Relevant Orders   Pfizer Fall 2023 Covid-19 Vaccine 51yr and older (Completed)   Need for immunization against influenza   Relevant Orders   Flu Vaccine QUAD High Dose(Fluad) (Completed)  .  Meds ordered this encounter  Medications   phenazopyridine (PYRIDIUM) 200 MG tablet    Sig: Take 1 tablet (200 mg total) by mouth 3 (three) times daily as needed (burning with urination or bladder spasms).    Dispense:  40 tablet    Refill:  1   aspirin EC (ASPIRIN LOW DOSE) 81 MG tablet    Sig: Take 1 tablet (81 mg total) by mouth daily. Swallow whole.    Dispense:  90 tablet    Refill:  3    Orders Placed This Encounter  Procedures   Flu Vaccine QUAD High Dose(Fluad)   Pfizer Fall 2023 Covid-19 Vaccine 162yrand older   CBC With Diff/Platelet   Comprehensive metabolic panel   Lipid panel   Hemoglobin A1c   Cardiovascular Risk Assessment    Total time spent on today's visit was greater than 30 minutes, including both face-to-face time and nonface-to-face time personally spent on review of chart (labs and imaging), discussing labs and goals, discussing further work-up, treatment options, referrals to specialist if needed, reviewing outside records of pertinent, answering patient's questions, and coordinating care.   MaThompson Caulacting as a  scEducation administratoror KiRochel BromeMD.,have documented all relevant documentation on the behalf of KiRochel BromeMD,as directed by  KiRochel BromeMD while in the presence of KiRochel BromeMD.   Follow-up: Return in about 3 months (around 04/09/2022) for chronic fasting.  An After Visit Summary was printed and given to the patient.   KiRochel BromeMD [facility] [phone]

## 2022-01-06 NOTE — Assessment & Plan Note (Signed)
Well controlled.  No changes to medicines. Amlodipine 5 mg daily, Aspirin 81 mg daily, Clonidine 0.1 mg daily PRN, Lisinopril 20 mg daily. Continue to work on eating a healthy diet and exercise.  Labs drawn today.

## 2022-01-06 NOTE — Assessment & Plan Note (Signed)
Hemoglobin A1c 5.9%, 3 month avg of blood sugars, is in prediabetic range.  In order to prevent progression to diabetes, recommend low carb diet and regular exercise and continue with Metformin 500 mg daily.

## 2022-01-06 NOTE — Assessment & Plan Note (Signed)
The current medical regimen is effective;  continue present plan and medications. The left ventricular ejection fraction is 55-65% by visual estimate

## 2022-01-06 NOTE — Assessment & Plan Note (Signed)
Continue Omeprazole 40mg daily 

## 2022-01-07 ENCOUNTER — Ambulatory Visit: Payer: Medicare PPO | Admitting: Family Medicine

## 2022-01-07 VITALS — BP 122/70 | HR 72 | Temp 96.4°F | Resp 18 | Ht 71.0 in | Wt 192.0 lb

## 2022-01-07 DIAGNOSIS — I5032 Chronic diastolic (congestive) heart failure: Secondary | ICD-10-CM | POA: Diagnosis not present

## 2022-01-07 DIAGNOSIS — R7301 Impaired fasting glucose: Secondary | ICD-10-CM

## 2022-01-07 DIAGNOSIS — K219 Gastro-esophageal reflux disease without esophagitis: Secondary | ICD-10-CM | POA: Diagnosis not present

## 2022-01-07 DIAGNOSIS — Z23 Encounter for immunization: Secondary | ICD-10-CM | POA: Diagnosis not present

## 2022-01-07 DIAGNOSIS — I110 Hypertensive heart disease with heart failure: Secondary | ICD-10-CM

## 2022-01-07 DIAGNOSIS — C678 Malignant neoplasm of overlapping sites of bladder: Secondary | ICD-10-CM

## 2022-01-07 DIAGNOSIS — I2510 Atherosclerotic heart disease of native coronary artery without angina pectoris: Secondary | ICD-10-CM

## 2022-01-07 DIAGNOSIS — I480 Paroxysmal atrial fibrillation: Secondary | ICD-10-CM

## 2022-01-07 DIAGNOSIS — E782 Mixed hyperlipidemia: Secondary | ICD-10-CM

## 2022-01-07 DIAGNOSIS — N3941 Urge incontinence: Secondary | ICD-10-CM | POA: Diagnosis not present

## 2022-01-07 MED ORDER — ASPIRIN 81 MG PO TBEC: 81.0000 mg | DELAYED_RELEASE_TABLET | Freq: Every day | ORAL | 3 refills | Status: DC

## 2022-01-07 MED ORDER — PHENAZOPYRIDINE HCL 200 MG PO TABS: 200.0000 mg | ORAL_TABLET | Freq: Three times a day (TID) | ORAL | 1 refills | Status: DC | PRN

## 2022-01-07 NOTE — Patient Instructions (Addendum)
Ask oncology about whether pyridium would help burning after chemotherapy.

## 2022-01-07 NOTE — Assessment & Plan Note (Addendum)
Prescription: pyridium. Discuss with oncology to see if pyridium will help with dysuria which occurs after receives chemo.  Management per specialist.  Sees Dr. Dyanne Iha and Dr. Pascal Lux.

## 2022-01-08 LAB — LIPID PANEL
Chol/HDL Ratio: 2.6 ratio (ref 0.0–5.0)
Cholesterol, Total: 132 mg/dL (ref 100–199)
HDL: 50 mg/dL (ref >39)
LDL Chol Calc (NIH): 60 mg/dL (ref 0–99)
Triglycerides: 122 mg/dL (ref 0–149)
VLDL Cholesterol Cal: 22 mg/dL (ref 5–40)

## 2022-01-08 LAB — CBC WITH DIFF/PLATELET
Basophils Absolute: 0 10*3/uL (ref 0.0–0.2)
Basos: 0 %
EOS (ABSOLUTE): 0.1 10*3/uL (ref 0.0–0.4)
Eos: 1 %
Hematocrit: 47.4 % (ref 37.5–51.0)
Hemoglobin: 16.1 g/dL (ref 13.0–17.7)
Immature Grans (Abs): 0 10*3/uL (ref 0.0–0.1)
Immature Granulocytes: 0 %
Lymphocytes Absolute: 1.1 10*3/uL (ref 0.7–3.1)
Lymphs: 19 %
MCH: 30.3 pg (ref 26.6–33.0)
MCHC: 34 g/dL (ref 31.5–35.7)
MCV: 89 fL (ref 79–97)
Monocytes Absolute: 0.6 10*3/uL (ref 0.1–0.9)
Monocytes: 11 %
Neutrophils Absolute: 3.8 10*3/uL (ref 1.4–7.0)
Neutrophils: 69 %
Platelets: 178 10*3/uL (ref 150–450)
RBC: 5.32 x10E6/uL (ref 4.14–5.80)
RDW: 12.9 % (ref 11.6–15.4)
WBC: 5.6 10*3/uL (ref 3.4–10.8)

## 2022-01-08 LAB — COMPREHENSIVE METABOLIC PANEL
ALT: 22 IU/L (ref 0–44)
AST: 20 IU/L (ref 0–40)
Albumin/Globulin Ratio: 2 (ref 1.2–2.2)
Albumin: 4.9 g/dL — ABNORMAL HIGH (ref 3.7–4.7)
Alkaline Phosphatase: 111 IU/L (ref 44–121)
BUN/Creatinine Ratio: 17 (ref 10–24)
BUN: 17 mg/dL (ref 8–27)
Bilirubin Total: 0.9 mg/dL (ref 0.0–1.2)
CO2: 21 mmol/L (ref 20–29)
Calcium: 10.1 mg/dL (ref 8.6–10.2)
Chloride: 100 mmol/L (ref 96–106)
Creatinine, Ser: 1.01 mg/dL (ref 0.76–1.27)
Globulin, Total: 2.5 g/dL (ref 1.5–4.5)
Glucose: 114 mg/dL — ABNORMAL HIGH (ref 70–99)
Potassium: 5 mmol/L (ref 3.5–5.2)
Sodium: 139 mmol/L (ref 134–144)
Total Protein: 7.4 g/dL (ref 6.0–8.5)
eGFR: 74 mL/min/{1.73_m2} (ref >59)

## 2022-01-08 LAB — HEMOGLOBIN A1C
Est. average glucose Bld gHb Est-mCnc: 128 mg/dL
Hgb A1c MFr Bld: 6.1 % — ABNORMAL HIGH (ref 4.8–5.6)

## 2022-01-08 LAB — CARDIOVASCULAR RISK ASSESSMENT

## 2022-01-12 DIAGNOSIS — Z23 Encounter for immunization: Secondary | ICD-10-CM | POA: Insufficient documentation

## 2022-01-12 NOTE — Assessment & Plan Note (Signed)
Continue oxybutynin and flomax.

## 2022-01-17 ENCOUNTER — Ambulatory Visit: Payer: Medicare PPO | Admitting: Physician Assistant

## 2022-01-17 ENCOUNTER — Encounter: Payer: Self-pay | Admitting: Physician Assistant

## 2022-01-17 VITALS — BP 110/62 | HR 84 | Temp 97.7°F | Ht 71.0 in | Wt 189.0 lb

## 2022-01-17 DIAGNOSIS — R0602 Shortness of breath: Secondary | ICD-10-CM | POA: Diagnosis not present

## 2022-01-17 DIAGNOSIS — J189 Pneumonia, unspecified organism: Secondary | ICD-10-CM

## 2022-01-17 DIAGNOSIS — R059 Cough, unspecified: Secondary | ICD-10-CM | POA: Diagnosis not present

## 2022-01-17 DIAGNOSIS — R0789 Other chest pain: Secondary | ICD-10-CM

## 2022-01-17 DIAGNOSIS — J069 Acute upper respiratory infection, unspecified: Secondary | ICD-10-CM

## 2022-01-17 LAB — POC COVID19 BINAXNOW: SARS Coronavirus 2 Ag: NEGATIVE

## 2022-01-17 LAB — POCT INFLUENZA A/B
Influenza A, POC: NEGATIVE
Influenza B, POC: NEGATIVE

## 2022-01-17 MED ORDER — TRIAMCINOLONE ACETONIDE 40 MG/ML IJ SUSP
60.0000 mg | Freq: Once | INTRAMUSCULAR | Status: AC
  Administered 2022-01-17: 60 mg via INTRAMUSCULAR

## 2022-01-17 MED ORDER — CEFTRIAXONE SODIUM 1 G IJ SOLR
1.0000 g | Freq: Once | INTRAMUSCULAR | Status: AC
  Administered 2022-01-17: 1 g via INTRAMUSCULAR

## 2022-01-17 MED ORDER — HYDROCODONE BIT-HOMATROP MBR 5-1.5 MG/5ML PO SOLN: 5.0000 mL | Freq: Four times a day (QID) | ORAL | 0 refills | Status: DC | PRN

## 2022-01-17 MED ORDER — PREDNISONE 20 MG PO TABS: ORAL_TABLET | ORAL | 0 refills | Status: DC

## 2022-01-17 MED ORDER — AMOXICILLIN-POT CLAVULANATE 875-125 MG PO TABS: 1.0000 | ORAL_TABLET | Freq: Two times a day (BID) | ORAL | 0 refills | Status: DC

## 2022-01-17 MED ORDER — ALBUTEROL SULFATE HFA 108 (90 BASE) MCG/ACT IN AERS: 2.0000 | INHALATION_SPRAY | Freq: Four times a day (QID) | RESPIRATORY_TRACT | 2 refills | Status: DC | PRN

## 2022-01-17 NOTE — Progress Notes (Signed)
Acute Office Visit  Subjective:    Patient ID: [name], male    DOB: [birth year], 81 y.o.   MRN: [id number]  Chief Complaint  Patient presents with   Fever   Generalized Body Aches   Cough   Nasal Congestion    HPI: Patient is in today for complaints of cough, congestion, malaise for the past several days.  He states that when he coughs it is painful in his chest and at night has some intermittent chest pains that last a few minutes.  Says it does seem to correlate with coughing.  He states that he feels at times he cannot get a good deep breath.  Has had some intermittent wheezing as well. Of note is undergoing chemotherapy for renal carcinoma and next treatment scheduled for next week  Past Medical History:  Diagnosis Date   Angina pectoris ([place]) 08/02/2019   Arthritis    on meds   Atrial fibrillation ([place])    Backache 08/17/2012   Body mass index (BMI) 26.0-26.9, adult 05/08/2020   Cancer ([place]) 10/2015   prostate cancer treated with radiation   Carpal tunnel syndrome 01/18/2018   Cataract    sx-bilaterally   Cervical spondylosis without myelopathy 09/24/2015   Chest tightness 06/28/2019   Chronic back pain 05/30/2013   Chronic low back pain 02/20/2014   Colitis 11/22/2018   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 06/17/2016   Deficiency of other specified B group vitamins 05/20/2019   Depression 04/04/2019   Diabetes mellitus due to underlying condition with unspecified complications ([place]) 5/46/5681   Diabetes mellitus without complication ([place])    Type II   Diarrhea 11/21/2018   Dyslipidemia 06/17/2016   Dyspnea on exertion 06/28/2019   Dysrhythmia    Essential (primary) hypertension 06/17/2016   Essential hypertension 06/17/2016   GERD (gastroesophageal reflux disease)    on meds   Greater trochanteric pain syndrome 07/03/2020   Headache    History of hiatal hernia    History of kidney stones    History of prostate cancer 05/20/2019    Hypercholesteremia 04/04/2019   Hyperlipemia    Hypertension    Impaired fasting blood sugar 05/20/2019   Impingement syndrome of shoulder region 01/12/2018   Impingement syndrome of shoulder region 01/12/2018   Low back pain 02/20/2014   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   Lumbar radiculopathy 05/24/2019   Malignant neoplasm prostate ([place]) 05/20/2019   Medicare annual wellness visit, subsequent 01/16/2020   Mixed hyperlipidemia 08/31/2019   Neck pain 07/03/2015   Paresthesia of hand 01/12/2018   Paresthesia of upper limb 01/12/2018   Paroxysmal atrial fibrillation ([place]) 04/04/2019   PONV (postoperative nausea and vomiting)    Pyelonephritis 06/08/2021   S/P cervical spinal fusion 03/04/2018   S/P lumbar fusion 12/09/2019   S/P lumbar spinal fusion 09/29/2013   S/P lumbar spinal fusion 09/29/2013   Sepsis ([place]) 11/22/2018   Shoulder pain 01/12/2018   Spinal 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 01/26/2018   Stenosis of intervertebral foramina 01/12/2018   Trochanteric bursitis of right hip 04/19/2020    Past Surgical History:  Procedure Laterality Date   anal fissures     ANTERIOR CERVICAL DECOMP/DISCECTOMY FUSION N/A 03/04/2018   Procedure: Anterior Cervical Decompression Fusion - Cervical three-Cervical four - Cervical four-Cervical five;  Surgeon: Eustace Moore, MD;  Location: [facility];  Service: Neurosurgery;  Laterality: N/A;   ANTERIOR LAT LUMBAR FUSION N/A 05/30/2020   Procedure: Anterior Lateral Lumbar Fusion Lumbar One-Two with  Lateral Plate Removal of pedicle screws Lumbar One and Posterior Lateral Fusion Lumbar One-Lumbar Three;  Surgeon: Eustace Moore, MD;  Location: [facility];  Service: Neurosurgery;  Laterality: N/A;   BACK SURGERY     CARDIAC CATHETERIZATION     no PCI   CARPAL TUNNEL RELEASE Left 03/04/2018   Procedure: Carpal Tunnel Release - left;  Surgeon: Eustace Moore, MD;  Location: [facility];  Service: Neurosurgery;  Laterality: Left;   CHOLECYSTECTOMY     COLONOSCOPY W/ POLYPECTOMY   08/30/2012   Colonic polyp status post polypectomy.  Pancolonic diverticulosis predominantly in the sigmoid colon. Small internal hemorrhoids. Moderate internal hemorhroids.    ESOPHAGOGASTRODUODENOSCOPY  08/04/2016   Schatzkis ring status post esophageal dilatation. Small hiatal hernia. Mild gastritis.    EYE SURGERY     cataract removal bilateral eyes   FOOT SURGERY     left heel   HARDWARE REMOVAL N/A 05/30/2020   Procedure: HARDWARE REMOVAL;  Surgeon: Eustace Moore, MD;  Location: [facility];  Service: Neurosurgery;  Laterality: N/A;   HERNIA REPAIR     LAMINECTOMY WITH POSTERIOR LATERAL ARTHRODESIS LEVEL 2 N/A 12/09/2019   Procedure: Laminectomy and Foraminotomy - Lumbar one-two, Lumbar two-three, posterolateral instrumented fusion Lumbar one-three, removal of instrumentation Lumbar three-five;  Surgeon: Eustace Moore, MD;  Location: [facility];  Service: Neurosurgery;  Laterality: N/A;   LEFT HEART CATH AND CORONARY ANGIOGRAPHY N/A 08/03/2019   Procedure: LEFT HEART CATH AND CORONARY ANGIOGRAPHY;  Surgeon: Jettie Booze, MD;  Location: [facility];  Service: Cardiovascular;  Laterality: N/A;   SHOULDER SURGERY     bilateral   WISDOM TOOTH EXTRACTION      Family History  Problem Relation Age of Onset   Colon cancer Neg Hx    Esophageal cancer Neg Hx    Rectal cancer Neg Hx    Stomach cancer Neg Hx    Colon polyps Neg Hx     Social History   Socioeconomic History   Marital status: Widowed    Spouse name: Not on file   Number of children: 2   Years of education: Not on file   Highest education level: Not on file  Occupational History   Not on file  Tobacco Use   Smoking status: Former    Packs/day: 0.50    Years: 35.00    Total pack years: 17.50    Types: Cigarettes    Quit date: 02/10/1993    Years since quitting: 28.9   Smokeless tobacco: Former    Types: Chew    Quit date: 02/10/2006  Vaping Use   Vaping Use: Never used  Substance and Sexual Activity    Alcohol use: Not Currently    Alcohol/week: 1.0 standard drink of alcohol    Types: 1 Cans of beer per week    Comment: occ   Drug use: No   Sexual activity: Yes    Partners: Female  Other Topics Concern   Not on file  Social History Narrative   Not on file   Social Determinants of Health   Financial Resource Strain: Low Risk  (01/17/2021)   Overall Financial Resource Strain (CARDIA)    Difficulty of Paying Living Expenses: Not hard at all  Food Insecurity: No Food Insecurity (01/17/2021)   Hunger Vital Sign    Worried About Running Out of Food in the Last Year: Never true    [place] in the Last Year: Never true  Transportation Needs:  No Transportation Needs (01/17/2021)   PRAPARE - Hydrologist (Medical): No    Lack of Transportation (Non-Medical): No  Physical Activity: Inactive (01/17/2021)   Exercise Vital Sign    Days of Exercise per Week: 0 days    Minutes of Exercise per Session: 0 min  Stress: No Stress Concern Present (01/17/2021)   [place]    Feeling of Stress : Not at all  Social Connections: Moderately Isolated (01/17/2021)   Social Connection and Isolation Panel [NHANES]    Frequency of Communication with Friends and Family: More than three times a week    Frequency of Social Gatherings with Friends and Family: Three times a week    Attends Religious Services: More than 4 times per year    Active Member of Clubs or Organizations: No    Attends Archivist Meetings: Never    Marital Status: Widowed  Intimate Partner Violence: Not At Risk (01/17/2021)   Humiliation, Afraid, Rape, and Kick questionnaire    Fear of Current or Ex-Partner: No    Emotionally Abused: No    Physically Abused: No    Sexually Abused: No    Outpatient Medications Prior to Visit  Medication Sig Dispense Refill   amLODipine (NORVASC) 5 MG tablet Take 5 mg by mouth daily.      aspirin EC (ASPIRIN LOW DOSE) 81 MG tablet Take 1 tablet (81 mg total) by mouth daily. Swallow whole. 90 tablet 3   atorvastatin (LIPITOR) 40 MG tablet TAKE 1 TABLET EVERY DAY  (NEW DOSE) (Patient taking differently: Take 40 mg by mouth daily.) 90 tablet 1   carboxymethylcellulose (REFRESH PLUS) 0.5 % SOLN Place 1 drop into both eyes 3 (three) times daily as needed for dry eyes (dry/irritated eyes).     cholecalciferol (VITAMIN D) 1000 UNITS tablet Take 1,000 Units by mouth in the morning.     cloNIDine (CATAPRES) 0.1 MG tablet Take 0.1 mg by mouth as needed (Take when sbp 170).     lisinopril (ZESTRIL) 20 MG tablet TAKE 1 TABLET BY MOUTH EVERY DAY 90 tablet 1   metFORMIN (GLUCOPHAGE) 500 MG tablet Take 1 tablet (500 mg total) by mouth daily. 90 tablet 1   methocarbamol (ROBAXIN) 500 MG tablet Take by mouth.     nitroGLYCERIN (NITROSTAT) 0.4 MG SL tablet Place 1 tablet (0.4 mg total) under the tongue every 5 (five) minutes x 3 doses as needed for chest pain. 25 tablet 12   Omega-3 Fatty Acids (FISH OIL) 1200 MG CAPS Take 1,200 mg by mouth in the morning.     omeprazole (PRILOSEC) 40 MG capsule Take 1 capsule (40 mg total) by mouth daily. 90 capsule 1   phenazopyridine (PYRIDIUM) 200 MG tablet Take 1 tablet (200 mg total) by mouth 3 (three) times daily as needed (burning with urination or bladder spasms). 40 tablet 1   tamsulosin (FLOMAX) 0.4 MG CAPS capsule Take 0.4 mg by mouth daily after supper.  1   cyclobenzaprine (FLEXERIL) 5 MG tablet Take 1 tablet (5 mg total) by mouth 3 (three) times daily as needed for muscle spasms. (Patient not taking: Reported on 01/17/2022) 30 tablet 1   oxybutynin (DITROPAN-XL) 5 MG 24 hr tablet Take 5 mg by mouth daily. (Patient not taking: Reported on 01/17/2022)     No facility-administered medications prior to visit.    Allergies  Allergen Reactions   Oxycodone Other (See Comments)  loopy "loopy"    Review of Systems CONSTITUTIONAL: see HPI E/N/T: see  HPI CARDIOVASCULAR: see HPI RESPIRATORY: see HPI GASTROINTESTINAL: Negative for abdominal pain, acid reflux symptoms, constipation, diarrhea, nausea and vomiting.  MSK:has had some bodyaches INTEGUMENTARY: Negative for rash.      Objective:  PHYSICAL EXAM:   VS: BP 110/62 (BP Location: Left Arm, Patient Position: Sitting, Cuff Size: Large)   Pulse 84   Temp 97.7 F (36.5 C) (Temporal)   Ht _0  (1.803 m)   Wt 189 lb (85.7 kg)   SpO2 93%   BMI 26.36 kg/m   GEN: Well nourished, well developed, in no acute distress  HEENT: normal external ears and nose - normal external auditory canals and TMS - - Lips, Teeth and Gums - normal  Oropharynx - mild erythema/pnd Cardiac: RRR; no murmurs, rubs, Respiratory:  scattered rhonchi/wheezes throughout both lung fields Skin: warm and dry, no rash   Office Visit on 01/17/2022  Component Date Value Ref Range Status   SARS Coronavirus 2 Ag 01/17/2022 Negative  Negative Final   Influenza A, POC 01/17/2022 Negative  Negative Final   Influenza B, POC 01/17/2022 Negative  Negative Final   EKG with no acute changes    Health Maintenance Due  Topic Date Due   OPHTHALMOLOGY EXAM  Never done   Diabetic kidney evaluation - Urine ACR  Never done   DTaP/Tdap/Td (1 - Tdap) Never done   Zoster Vaccines- Shingrix (1 of 2) Never done   Medicare Annual Wellness (AWV)  01/17/2022    There are no preventive care reminders to display for this patient.   Lab Results  Component Value Date   TSH 3.000 08/31/2019   Lab Results  Component Value Date   WBC 5.6 01/07/2022   HGB 16.1 01/07/2022   HCT 47.4 01/07/2022   MCV 89 01/07/2022   PLT 178 01/07/2022   Lab Results  Component Value Date   NA 139 01/07/2022   K 5.0 01/07/2022   CO2 21 01/07/2022   GLUCOSE 114 (H) 01/07/2022   BUN 17 01/07/2022   CREATININE 1.01 01/07/2022   BILITOT 0.9 01/07/2022   ALKPHOS 111 01/07/2022   AST 20 01/07/2022   ALT 22 01/07/2022   PROT 7.4 01/07/2022    ALBUMIN 4.9 (H) 01/07/2022   CALCIUM 10.1 01/07/2022   ANIONGAP 10 05/29/2020   EGFR 74 01/07/2022   Lab Results  Component Value Date   CHOL 132 01/07/2022   Lab Results  Component Value Date   HDL 50 01/07/2022   Lab Results  Component Value Date   LDLCALC 60 01/07/2022   Lab Results  Component Value Date   TRIG 122 01/07/2022   Lab Results  Component Value Date   CHOLHDL 2.6 01/07/2022   Lab Results  Component Value Date   HGBA1C 6.1 (H) 01/07/2022       Assessment & Plan:   Problem List Items Addressed This Visit   None Visit Diagnoses     Acute upper respiratory infection    -  Primary   Relevant Medications   cefTRIAXone (ROCEPHIN) injection 1 g   Other Relevant Orders   POC COVID-19 BinaxNow (Completed)   POCT Influenza A/B (Completed)   Other chest pain       Relevant Orders   EKG 12-Lead   Pneumonia of lower lobe due to infectious organism, unspecified laterality       Relevant Medications   cefTRIAXone (ROCEPHIN) injection 1 g   triamcinolone  acetonide (KENALOG-40) injection 60 mg   predniSONE (DELTASONE) 20 MG tablet   amoxicillin-clavulanate (AUGMENTIN) 875-125 MG tablet   HYDROcodone bit-homatropine (HYDROMET) 5-1.5 MG/5ML syrup   albuterol (VENTOLIN HFA) 108 (90 Base) MCG/ACT inhaler   Other Relevant Orders   DG Chest 2 View      Meds ordered this encounter  Medications   cefTRIAXone (ROCEPHIN) injection 1 g   triamcinolone acetonide (KENALOG-40) injection 60 mg   predniSONE (DELTASONE) 20 MG tablet    Sig: Take 3 tablets (60 mg total) by mouth daily with breakfast for 3 days, THEN 2 tablets (40 mg total) daily with breakfast for 3 days, THEN 1 tablet (20 mg total) daily with breakfast for 3 days.    Dispense:  18 tablet    Refill:  0    Order Specific Question:   Supervising Provider    Answer:   Shelton Silvas   amoxicillin-clavulanate (AUGMENTIN) 875-125 MG tablet    Sig: Take 1 tablet by mouth 2 (two) times daily.     Dispense:  20 tablet    Refill:  0    Order Specific Question:   Supervising Provider    Answer:   Shelton Silvas   HYDROcodone bit-homatropine (HYDROMET) 5-1.5 MG/5ML syrup    Sig: Take 5 mLs by mouth every 6 (six) hours as needed.    Dispense:  120 mL    Refill:  0    Order Specific Question:   Supervising Provider    Answer:   Shelton Silvas   albuterol (VENTOLIN HFA) 108 (90 Base) MCG/ACT inhaler    Sig: Inhale 2 puffs into the lungs every 6 (six) hours as needed for wheezing or shortness of breath.    Dispense:  8 g    Refill:  2    Order Specific Question:   Supervising Provider    AnswerShelton Silvas    Orders Placed This Encounter  Procedures   DG Chest 2 View   POC COVID-19 BinaxNow   POCT Influenza A/B   EKG 12-Lead     Follow-up: Return in about 1 week (around 01/24/2022) for for follow up.  An After Visit Summary was printed and given to the patient.  Yetta Flock [facility] [phone]

## 2022-01-20 ENCOUNTER — Other Ambulatory Visit: Payer: Self-pay

## 2022-01-20 DIAGNOSIS — J189 Pneumonia, unspecified organism: Secondary | ICD-10-CM

## 2022-01-22 DIAGNOSIS — C678 Malignant neoplasm of overlapping sites of bladder: Secondary | ICD-10-CM | POA: Diagnosis not present

## 2022-01-22 DIAGNOSIS — D4112 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: Secondary | ICD-10-CM | POA: Diagnosis not present

## 2022-01-24 ENCOUNTER — Ambulatory Visit: Payer: Medicare PPO | Admitting: Physician Assistant

## 2022-01-24 ENCOUNTER — Encounter: Payer: Self-pay | Admitting: Physician Assistant

## 2022-01-24 VITALS — BP 110/60 | HR 87 | Temp 97.5°F | Resp 16 | Ht 71.0 in | Wt 187.0 lb

## 2022-01-24 DIAGNOSIS — J189 Pneumonia, unspecified organism: Secondary | ICD-10-CM | POA: Diagnosis not present

## 2022-01-24 NOTE — Progress Notes (Signed)
Acute Office Visit  Subjective:    Patient ID: [name], male    DOB: 1940-03-18, 81 y.o.   MRN: [id number]  Chief Complaint  Patient presents with   URI    HPI: Patient is in today for follow up of pneumonia.  He is still taking antibiotics, prednisone and cough medication.  Although chest xray was normal he did present with clinical pneumonia at last visit.  Patient states he is feeling better - no further cough, malaise, or troulbe breathing. 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 06/17/2016   Deficiency of other specified B group vitamins 05/20/2019   Depression 04/04/2019   Diabetes mellitus due to underlying condition with unspecified complications ([place]) 10/26/567   Diabetes mellitus without complication ([place])    Type II   Diarrhea 11/21/2018   Dyslipidemia 06/17/2016   Dyspnea on exertion 06/28/2019   Dysrhythmia    Essential (primary) hypertension 06/17/2016   Essential hypertension 06/17/2016   GERD (gastroesophageal reflux disease)    on meds   Greater trochanteric pain syndrome 07/03/2020   Headache    History of hiatal hernia    History of kidney stones    History of prostate cancer 05/20/2019   Hypercholesteremia 04/04/2019   Hyperlipemia    Hypertension    Impaired fasting blood sugar 05/20/2019   Impingement syndrome of shoulder region 01/12/2018   Impingement syndrome of shoulder region 01/12/2018   Low back  pain 02/20/2014   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   Lumbar radiculopathy 05/24/2019   Malignant neoplasm prostate ([place]) 05/20/2019   Medicare annual wellness visit, subsequent 01/16/2020   Mixed hyperlipidemia 08/31/2019   Neck pain 07/03/2015   Paresthesia of hand 01/12/2018   Paresthesia of upper limb 01/12/2018   Paroxysmal atrial fibrillation ([place]) 04/04/2019   PONV (postoperative nausea and vomiting)    Pyelonephritis 06/08/2021   S/P cervical spinal fusion 03/04/2018   S/P lumbar fusion 12/09/2019   S/P lumbar spinal fusion 09/29/2013   S/P lumbar spinal fusion 09/29/2013   Sepsis ([place]) 11/22/2018   Shoulder pain 01/12/2018   Spinal 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 01/26/2018   Stenosis of intervertebral foramina 01/12/2018   Trochanteric bursitis of right hip 04/19/2020    Past Surgical History:  Procedure Laterality Date   anal fissures     ANTERIOR CERVICAL DECOMP/DISCECTOMY FUSION N/A 03/04/2018   Procedure: Anterior Cervical Decompression Fusion - Cervical three-Cervical four - Cervical four-Cervical five;  Surgeon: Eustace Moore, MD;  Location: [facility];  Service: Neurosurgery;  Laterality: N/A;   ANTERIOR LAT LUMBAR FUSION N/A 05/30/2020   Procedure: Anterior Lateral Lumbar Fusion Lumbar One-Two with Lateral Plate Removal of pedicle screws Lumbar One and Posterior Lateral Fusion Lumbar One-Lumbar Three;  Surgeon: Eustace Moore, MD;  Location: [facility];  Service: Neurosurgery;  Laterality: N/A;   BACK SURGERY     CARDIAC CATHETERIZATION     no  PCI   CARPAL TUNNEL RELEASE Left 03/04/2018   Procedure: Carpal Tunnel Release - left;  Surgeon: Eustace Moore, MD;  Location: [facility];  Service: Neurosurgery;  Laterality: Left;   CHOLECYSTECTOMY     COLONOSCOPY W/ POLYPECTOMY  08/30/2012   Colonic polyp status post polypectomy.  Pancolonic diverticulosis predominantly in the sigmoid colon. Small internal hemorrhoids. Moderate internal hemorhroids.    ESOPHAGOGASTRODUODENOSCOPY  08/04/2016   Schatzkis  ring status post esophageal dilatation. Small hiatal hernia. Mild gastritis.    EYE SURGERY     cataract removal bilateral eyes   FOOT SURGERY     left heel   HARDWARE REMOVAL N/A 05/30/2020   Procedure: HARDWARE REMOVAL;  Surgeon: Eustace Moore, MD;  Location: [facility];  Service: Neurosurgery;  Laterality: N/A;   HERNIA REPAIR     LAMINECTOMY WITH POSTERIOR LATERAL ARTHRODESIS LEVEL 2 N/A 12/09/2019   Procedure: Laminectomy and Foraminotomy - Lumbar one-two, Lumbar two-three, posterolateral instrumented fusion Lumbar one-three, removal of instrumentation Lumbar three-five;  Surgeon: Eustace Moore, MD;  Location: [facility];  Service: Neurosurgery;  Laterality: N/A;   LEFT HEART CATH AND CORONARY ANGIOGRAPHY N/A 08/03/2019   Procedure: LEFT HEART CATH AND CORONARY ANGIOGRAPHY;  Surgeon: Jettie Booze, MD;  Location: [facility];  Service: Cardiovascular;  Laterality: N/A;   SHOULDER SURGERY     bilateral   WISDOM TOOTH EXTRACTION      Family History  Problem Relation Age of Onset   Colon cancer Neg Hx    Esophageal cancer Neg Hx    Rectal cancer Neg Hx    Stomach cancer Neg Hx    Colon polyps Neg Hx     Social History   Socioeconomic History   Marital status: Widowed    Spouse name: Not on file   Number of children: 2   Years of education: Not on file   Highest education level: Not on file  Occupational History   Not on file  Tobacco Use   Smoking status: Former    Packs/day: 0.50    Years: 35.00    Total pack years: 17.50    Types: Cigarettes    Quit date: 02/10/1993    Years since quitting: 28.9   Smokeless tobacco: Former    Types: Chew    Quit date: 02/10/2006  Vaping Use   Vaping Use: Never used  Substance and Sexual Activity   Alcohol use: Not Currently    Alcohol/week: 1.0 standard drink of alcohol    Types: 1 Cans of beer per week    Comment: occ   Drug use: No   Sexual activity: Yes    Partners: Female  Other Topics Concern   Not on file  Social  History Narrative   Not on file   Social Determinants of Health   Financial Resource Strain: Low Risk  (01/17/2021)   Overall Financial Resource Strain (CARDIA)    Difficulty of Paying Living Expenses: Not hard at all  Food Insecurity: No Food Insecurity (01/17/2021)   Hunger Vital Sign    Worried About Running Out of Food in the Last Year: Never true    [place] in the Last Year: Never true  Transportation Needs: No Transportation Needs (01/17/2021)   PRAPARE - Hydrologist (Medical): No    Lack of Transportation (Non-Medical): No  Physical Activity: Inactive (01/17/2021)   Exercise Vital Sign    Days of Exercise per Week: 0 days  Minutes of Exercise per Session: 0 min  Stress: No Stress Concern Present (01/17/2021)   [place]    Feeling of Stress : Not at all  Social Connections: Moderately Isolated (01/17/2021)   Social Connection and Isolation Panel [NHANES]    Frequency of Communication with Friends and Family: More than three times a week    Frequency of Social Gatherings with Friends and Family: Three times a week    Attends Religious Services: More than 4 times per year    Active Member of Clubs or Organizations: No    Attends Archivist Meetings: Never    Marital Status: Widowed  Intimate Partner Violence: Not At Risk (01/17/2021)   Humiliation, Afraid, Rape, and Kick questionnaire    Fear of Current or Ex-Partner: No    Emotionally Abused: No    Physically Abused: No    Sexually Abused: No    Outpatient Medications Prior to Visit  Medication Sig Dispense Refill   amLODipine (NORVASC) 5 MG tablet Take 5 mg by mouth daily.     amoxicillin-clavulanate (AUGMENTIN) 875-125 MG tablet Take 1 tablet by mouth 2 (two) times daily. 20 tablet 0   aspirin EC (ASPIRIN LOW DOSE) 81 MG tablet Take 1 tablet (81 mg total) by mouth daily. Swallow whole. 90 tablet 3    atorvastatin (LIPITOR) 40 MG tablet TAKE 1 TABLET EVERY DAY  (NEW DOSE) (Patient taking differently: Take 40 mg by mouth daily.) 90 tablet 1   carboxymethylcellulose (REFRESH PLUS) 0.5 % SOLN Place 1 drop into both eyes 3 (three) times daily as needed for dry eyes (dry/irritated eyes).     cholecalciferol (VITAMIN D) 1000 UNITS tablet Take 1,000 Units by mouth in the morning.     cloNIDine (CATAPRES) 0.1 MG tablet Take 0.1 mg by mouth as needed (Take when sbp 170).     cyclobenzaprine (FLEXERIL) 5 MG tablet Take 1 tablet (5 mg total) by mouth 3 (three) times daily as needed for muscle spasms. 30 tablet 1   lisinopril (ZESTRIL) 20 MG tablet TAKE 1 TABLET BY MOUTH EVERY DAY 90 tablet 1   metFORMIN (GLUCOPHAGE) 500 MG tablet Take 1 tablet (500 mg total) by mouth daily. 90 tablet 1   nitroGLYCERIN (NITROSTAT) 0.4 MG SL tablet Place 1 tablet (0.4 mg total) under the tongue every 5 (five) minutes x 3 doses as needed for chest pain. 25 tablet 12   Omega-3 Fatty Acids (FISH OIL) 1200 MG CAPS Take 1,200 mg by mouth in the morning.     omeprazole (PRILOSEC) 40 MG capsule Take 1 capsule (40 mg total) by mouth daily. 90 capsule 1   tamsulosin (FLOMAX) 0.4 MG CAPS capsule Take 0.4 mg by mouth daily after supper.  1   albuterol (VENTOLIN HFA) 108 (90 Base) MCG/ACT inhaler Inhale 2 puffs into the lungs every 6 (six) hours as needed for wheezing or shortness of breath. (Patient not taking: Reported on 01/24/2022) 8 g 2   HYDROcodone bit-homatropine (HYDROMET) 5-1.5 MG/5ML syrup Take 5 mLs by mouth every 6 (six) hours as needed. (Patient not taking: Reported on 01/24/2022) 120 mL 0   methocarbamol (ROBAXIN) 500 MG tablet Take by mouth. (Patient not taking: Reported on 01/24/2022)     oxybutynin (DITROPAN-XL) 5 MG 24 hr tablet Take 5 mg by mouth daily. (Patient not taking: Reported on 01/17/2022)     phenazopyridine (PYRIDIUM) 200 MG tablet Take 1 tablet (200 mg total) by mouth 3 (three) times  daily as needed (burning  with urination or bladder spasms). 40 tablet 1   predniSONE (DELTASONE) 20 MG tablet Take 3 tablets (60 mg total) by mouth daily with breakfast for 3 days, THEN 2 tablets (40 mg total) daily with breakfast for 3 days, THEN 1 tablet (20 mg total) daily with breakfast for 3 days. 18 tablet 0   No facility-administered medications prior to visit.    Allergies  Allergen Reactions   Oxycodone Other (See Comments)    loopy "loopy"    Review of Systems CONSTITUTIONAL: Negative for chills, fatigue, fever,  E/N/T: still has slight congestion CARDIOVASCULAR: Negative for chest pain,  RESPIRATORY: Negative for recent cough and dyspnea.  GASTROINTESTINAL: Negative for abdominal pain, acid reflux symptoms, constipation, diarrhea, nausea and vomiting.          Objective:  PHYSICAL EXAM:   VS: BP 110/60   Pulse 87   Temp (!) 97.5 F (36.4 C)   Resp 16   Ht _0  (1.803 m)   Wt 187 lb (84.8 kg)   SpO2 97%   BMI 26.08 kg/m   GEN: Well nourished, well developed, in no acute distress  HEENT: normal external ears and nose - normal external auditory canals and TMS -- Lips, Teeth and Gums - normal  Oropharynx - normal mucosa, palate, and posterior pharynx Cardiac: RRR; no murmurs, Respiratory:  normal respiratory rate and pattern with no distress - normal breath sounds with no rales, rhonchi, wheezes or rubs     Health Maintenance Due  Topic Date Due   OPHTHALMOLOGY EXAM  Never done   Diabetic kidney evaluation - Urine ACR  Never done   DTaP/Tdap/Td (1 - Tdap) Never done   Zoster Vaccines- Shingrix (1 of 2) Never done   Medicare Annual Wellness (AWV)  01/17/2022    There are no preventive care reminders to display for this patient.   Lab Results  Component Value Date   TSH 3.000 08/31/2019   Lab Results  Component Value Date   WBC 5.6 01/07/2022   HGB 16.1 01/07/2022   HCT 47.4 01/07/2022   MCV 89 01/07/2022   PLT 178 01/07/2022   Lab Results  Component Value Date    NA 139 01/07/2022   K 5.0 01/07/2022   CO2 21 01/07/2022   GLUCOSE 114 (H) 01/07/2022   BUN 17 01/07/2022   CREATININE 1.01 01/07/2022   BILITOT 0.9 01/07/2022   ALKPHOS 111 01/07/2022   AST 20 01/07/2022   ALT 22 01/07/2022   PROT 7.4 01/07/2022   ALBUMIN 4.9 (H) 01/07/2022   CALCIUM 10.1 01/07/2022   ANIONGAP 10 05/29/2020   EGFR 74 01/07/2022   Lab Results  Component Value Date   CHOL 132 01/07/2022   Lab Results  Component Value Date   HDL 50 01/07/2022   Lab Results  Component Value Date   LDLCALC 60 01/07/2022   Lab Results  Component Value Date   TRIG 122 01/07/2022   Lab Results  Component Value Date   CHOLHDL 2.6 01/07/2022   Lab Results  Component Value Date   HGBA1C 6.1 (H) 01/07/2022       Assessment & Plan:   Problem List Items Addressed This Visit   None Visit Diagnoses     Pneumonia of lower lobe due to infectious organism, unspecified laterality    -  Primary  RESOLVING Complete current medication therapy as directed    No orders of the defined types were placed in this encounter.  No orders of the defined types were placed in this encounter.    Follow-up: Return if symptoms worsen or fail to improve.  An After Visit Summary was printed and given to the patient.  Yetta Flock [facility] [phone]

## 2022-01-29 ENCOUNTER — Ambulatory Visit (INDEPENDENT_AMBULATORY_CARE_PROVIDER_SITE_OTHER): Payer: Medicare PPO

## 2022-01-29 VITALS — BP 138/72 | HR 80 | Resp 16 | Ht 71.0 in | Wt 189.0 lb

## 2022-01-29 DIAGNOSIS — Z0000 Encounter for general adult medical examination without abnormal findings: Secondary | ICD-10-CM

## 2022-02-06 DIAGNOSIS — D494 Neoplasm of unspecified behavior of bladder: Secondary | ICD-10-CM | POA: Insufficient documentation

## 2022-02-21 NOTE — Patient Instructions (Signed)
Mr. [name] , Thank you for taking time to come for your Medicare Wellness Visit. I appreciate your ongoing commitment to your health goals. Please review the following plan we discussed and let me know if I can assist you in the future.   Screening recommendations/referrals: Recommended yearly ophthalmology/optometry visit for glaucoma screening and checkup Recommended yearly dental visit for hygiene and checkup  Vaccinations: Influenza vaccine: up-to-date Pneumococcal vaccine: Complete Tdap vaccine: Due - get this at the pharmacy Shingles vaccine: Due - get this at the pharmacy    Advanced directives: Please bring a copy for your medical record   Preventive Care 65 Years and Older, Male Preventive care refers to lifestyle choices and visits with your health care provider that can promote health and wellness. What does preventive care include? A yearly physical exam. This is also called an annual well check. Dental exams once or twice a year. Routine eye exams. Ask your health care provider how often you should have your eyes checked. Personal lifestyle choices, including: Daily care of your teeth and gums. Regular physical activity. Eating a healthy diet. Avoiding tobacco and drug use. Limiting alcohol use. Practicing safe sex. Taking low doses of aspirin every day. Taking vitamin and mineral supplements as recommended by your health care provider. What happens during an annual well check? The services and screenings done by your health care provider during your annual well check will depend on your age, overall health, lifestyle risk factors, and family history of disease. Counseling  Your health care provider may ask you questions about your: Alcohol use. Tobacco use. Drug use. Emotional well-being. Home and relationship well-being. Sexual activity. Eating habits. History of falls. Memory and ability to understand (cognition). Work and work Statistician. Screening   You may have the following tests or measurements: Height, weight, and BMI. Blood pressure. Lipid and cholesterol levels. These may be checked every 5 years, or more frequently if you are over 71 years old. Skin check. Lung cancer screening. You may have this screening every year starting at age 43 if you have a 30-pack-year history of smoking and currently smoke or have quit within the past 15 years. Fecal occult blood test (FOBT) of the stool. You may have this test every year starting at age 55. Flexible sigmoidoscopy or colonoscopy. You may have a sigmoidoscopy every 5 years or a colonoscopy every 10 years starting at age 2. Prostate cancer screening. Recommendations will vary depending on your family history and other risks. Hepatitis C blood test. Hepatitis B blood test. Sexually transmitted disease (STD) testing. Diabetes screening. This is done by checking your blood sugar (glucose) after you have not eaten for a while (fasting). You may have this done every 1-3 years. Abdominal aortic aneurysm (AAA) screening. You may need this if you are a current or former smoker. Osteoporosis. You may be screened starting at age 18 if you are at high risk. Talk with your health care provider about your test results, treatment options, and if necessary, the need for more tests. Vaccines  Your health care provider may recommend certain vaccines, such as: Influenza vaccine. This is recommended every year. Tetanus, diphtheria, and acellular pertussis (Tdap, Td) vaccine. You may need a Td booster every 10 years. Zoster vaccine. You may need this after age 76. Pneumococcal 13-valent conjugate (PCV13) vaccine. One dose is recommended after age 65. Pneumococcal polysaccharide (PPSV23) vaccine. One dose is recommended after age 16. Talk to your health care provider about which screenings and vaccines you need and  how often you need them. This information is not intended to replace advice given to you by  your health care provider. Make sure you discuss any questions you have with your health care provider. Document Released: 02/23/2015 Document Revised: 10/17/2015 Document Reviewed: 11/28/2014 Elsevier Interactive Patient Education  2017 [place] Prevention in the Home Falls can cause injuries. They can happen to people of all ages. There are many things you can do to make your home safe and to help prevent falls. What can I do on the outside of my home? Regularly fix the edges of walkways and driveways and fix any cracks. Remove anything that might make you trip as you walk through a door, such as a raised step or threshold. Trim any bushes or trees on the path to your home. Use bright outdoor lighting. Clear any walking paths of anything that might make someone trip, such as rocks or tools. Regularly check to see if handrails are loose or broken. Make sure that both sides of any steps have handrails. Any raised decks and porches should have guardrails on the edges. Have any leaves, snow, or ice cleared regularly. Use sand or salt on walking paths during winter. Clean up any spills in your garage right away. This includes oil or grease spills. What can I do in the bathroom? Use night lights. Install grab bars by the toilet and in the tub and shower. Do not use towel bars as grab bars. Use non-skid mats or decals in the tub or shower. If you need to sit down in the shower, use a plastic, non-slip stool. Keep the floor dry. Clean up any water that spills on the floor as soon as it happens. Remove soap buildup in the tub or shower regularly. Attach bath mats securely with double-sided non-slip rug tape. Do not have throw rugs and other things on the floor that can make you trip. What can I do in the bedroom? Use night lights. Make sure that you have a light by your bed that is easy to reach. Do not use any sheets or blankets that are too big for your bed. They should not hang  down onto the floor. Have a firm chair that has side arms. You can use this for support while you get dressed. Do not have throw rugs and other things on the floor that can make you trip. What can I do in the kitchen? Clean up any spills right away. Avoid walking on wet floors. Keep items that you use a lot in easy-to-reach places. If you need to reach something above you, use a strong step stool that has a grab bar. Keep electrical cords out of the way. Do not use floor polish or wax that makes floors slippery. If you must use wax, use non-skid floor wax. Do not have throw rugs and other things on the floor that can make you trip. What can I do with my stairs? Do not leave any items on the stairs. Make sure that there are handrails on both sides of the stairs and use them. Fix handrails that are broken or loose. Make sure that handrails are as long as the stairways. Check any carpeting to make sure that it is firmly attached to the stairs. Fix any carpet that is loose or worn. Avoid having throw rugs at the top or bottom of the stairs. If you do have throw rugs, attach them to the floor with carpet tape. Make sure that you have  a light switch at the top of the stairs and the bottom of the stairs. If you do not have them, ask someone to add them for you. What else can I do to help prevent falls? Wear shoes that: Do not have high heels. Have rubber bottoms. Are comfortable and fit you well. Are closed at the toe. Do not wear sandals. If you use a stepladder: Make sure that it is fully opened. Do not climb a closed stepladder. Make sure that both sides of the stepladder are locked into place. Ask someone to hold it for you, if possible. Clearly mark and make sure that you can see: Any grab bars or handrails. First and last steps. Where the edge of each step is. Use tools that help you move around (mobility aids) if they are needed. These  include: Canes. Walkers. Scooters. Crutches. Turn on the lights when you go into a dark area. Replace any light bulbs as soon as they burn out. Set up your furniture so you have a clear path. Avoid moving your furniture around. If any of your floors are uneven, fix them. If there are any pets around you, be aware of where they are. Review your medicines with your doctor. Some medicines can make you feel dizzy. This can increase your chance of falling. Ask your doctor what other things that you can do to help prevent falls. This information is not intended to replace advice given to you by your health care provider. Make sure you discuss any questions you have with your health care provider. Document Released: 11/23/2008 Document Revised: 07/05/2015 Document Reviewed: 03/03/2014 Elsevier Interactive Patient Education  2017 [employer].

## 2022-02-21 NOTE — Progress Notes (Signed)
Subjective:   [name] is a 82 y.o. male who presents for Medicare Annual/Subsequent preventive examination.  This wellness visit is conducted by a nurse.  The patient's medications were reviewed and reconciled since the patient's last visit.  History details were provided by the patient.  The history appears to be reliable.    Medical History: Patient history and Family history was reviewed  Medications, Allergies, and preventative health maintenance was reviewed and updated.  Cardiac Risk Factors include: advanced age (>73mn, >>67women);diabetes mellitus;male gender     Objective:    Today's Vitals   01/29/22 0925  BP: 138/72  Pulse: 80  Resp: 16  Weight: 189 lb (85.7 kg)  Height: '5\' 11"'$  (1.803 m)  PainSc: 0-No pain   Body mass index is 26.36 kg/m.     01/17/2021   11:01 AM 11/21/2020    8:59 AM 05/30/2020   11:37 AM 05/29/2020    9:02 AM 01/16/2020    9:29 AM 12/14/2019    6:57 PM 12/06/2019   10:30 AM  Advanced Directives  Does Patient Have a Medical Advance Directive? Yes Yes Yes Yes No No Yes  Type of AParamedicof ADu PontLiving will Healthcare Power of ATown LineLiving will HHar[place]Living will   [place] Does patient want to make changes to medical advance directive? No - Patient declined   No - Patient declined  No - Patient declined No - Patient declined  Copy of HHillsboroin Chart? No - copy requested  No - copy requested No - copy requested   No - copy requested  Would patient like information on creating a medical advance directive?      No - Patient declined     Current Medications (verified) Outpatient Encounter Medications as of 01/29/2022  Medication Sig   albuterol (VENTOLIN HFA) 108 (90 Base) MCG/ACT inhaler Inhale 2 puffs into the lungs every 6 (six) hours as needed for wheezing or shortness of breath.   amLODipine (NORVASC) 5 MG  tablet Take 5 mg by mouth daily.   amoxicillin-clavulanate (AUGMENTIN) 875-125 MG tablet Take 1 tablet by mouth 2 (two) times daily.   aspirin EC (ASPIRIN LOW DOSE) 81 MG tablet Take 1 tablet (81 mg total) by mouth daily. Swallow whole.   atorvastatin (LIPITOR) 40 MG tablet TAKE 1 TABLET EVERY DAY  (NEW DOSE) (Patient taking differently: Take 40 mg by mouth daily.)   carboxymethylcellulose (REFRESH PLUS) 0.5 % SOLN Place 1 drop into both eyes 3 (three) times daily as needed for dry eyes (dry/irritated eyes).   cholecalciferol (VITAMIN D) 1000 UNITS tablet Take 1,000 Units by mouth in the morning.   cloNIDine (CATAPRES) 0.1 MG tablet Take 0.1 mg by mouth as needed (Take when sbp 170).   cyclobenzaprine (FLEXERIL) 5 MG tablet Take 1 tablet (5 mg total) by mouth 3 (three) times daily as needed for muscle spasms.   HYDROcodone bit-homatropine (HYDROMET) 5-1.5 MG/5ML syrup Take 5 mLs by mouth every 6 (six) hours as needed.   lisinopril (ZESTRIL) 20 MG tablet TAKE 1 TABLET BY MOUTH EVERY DAY   metFORMIN (GLUCOPHAGE) 500 MG tablet Take 1 tablet (500 mg total) by mouth daily.   methocarbamol (ROBAXIN) 500 MG tablet Take by mouth.   nitroGLYCERIN (NITROSTAT) 0.4 MG SL tablet Place 1 tablet (0.4 mg total) under the tongue every 5 (five) minutes x 3 doses as needed for chest pain.   Omega-3  Fatty Acids (FISH OIL) 1200 MG CAPS Take 1,200 mg by mouth in the morning.   omeprazole (PRILOSEC) 40 MG capsule Take 1 capsule (40 mg total) by mouth daily.   oxybutynin (DITROPAN-XL) 5 MG 24 hr tablet Take 5 mg by mouth daily.   tamsulosin (FLOMAX) 0.4 MG CAPS capsule Take 0.4 mg by mouth daily after supper.   No facility-administered encounter medications on file as of 01/29/2022.    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 06/17/2016   Deficiency of other specified B group vitamins 05/20/2019   Depression 04/04/2019   Diabetes mellitus due to underlying condition with unspecified complications ([place]) 8/67/6195   Diabetes mellitus without complication ([place])    Type II   Diarrhea 11/21/2018   Dyslipidemia 06/17/2016   Dyspnea on exertion 06/28/2019   Dysrhythmia    Essential (primary) hypertension 06/17/2016   Essential hypertension 06/17/2016   GERD (gastroesophageal reflux disease)    on meds   Greater trochanteric pain syndrome 07/03/2020   Headache    History of hiatal hernia    History of kidney stones    History of prostate cancer 05/20/2019   Hypercholesteremia 04/04/2019   Hyperlipemia    Hypertension    Impaired fasting blood sugar 05/20/2019   Impingement syndrome of shoulder region 01/12/2018   Impingement syndrome of shoulder region 01/12/2018   Low back pain 02/20/2014   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   Lumbar radiculopathy 05/24/2019   Malignant neoplasm prostate ([place]) 05/20/2019   Medicare annual wellness visit, subsequent 01/16/2020   Mixed hyperlipidemia 08/31/2019   Neck pain 07/03/2015   Paresthesia of hand 01/12/2018   Paresthesia of upper limb 01/12/2018   Paroxysmal atrial fibrillation ([place]) 04/04/2019   PONV (postoperative nausea and vomiting)    Pyelonephritis 06/08/2021   S/P cervical spinal fusion 03/04/2018   S/P lumbar fusion 12/09/2019   S/P lumbar spinal fusion 09/29/2013   S/P lumbar spinal fusion 09/29/2013   Sepsis ([place]) 11/22/2018   Shoulder pain 01/12/2018   Spinal 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 01/26/2018    Stenosis of intervertebral foramina 01/12/2018   Trochanteric bursitis of right hip 04/19/2020   Past Surgical History:  Procedure Laterality Date   anal fissures     ANTERIOR CERVICAL DECOMP/DISCECTOMY FUSION N/A 03/04/2018   Procedure: Anterior Cervical Decompression Fusion - Cervical three-Cervical four - Cervical four-Cervical five;  Surgeon: Eustace Moore, MD;  Location: [facility];  Service: Neurosurgery;  Laterality: N/A;   ANTERIOR LAT LUMBAR FUSION N/A 05/30/2020   Procedure: Anterior Lateral Lumbar Fusion Lumbar One-Two with Lateral Plate Removal of pedicle screws Lumbar One and Posterior Lateral Fusion Lumbar One-Lumbar Three;  Surgeon: Eustace Moore, MD;  Location: [facility];  Service: Neurosurgery;  Laterality: N/A;   BACK SURGERY     CARDIAC CATHETERIZATION     no PCI   CARPAL TUNNEL RELEASE Left 03/04/2018   Procedure: Carpal Tunnel  Release - left;  Surgeon: Eustace Moore, MD;  Location: [facility];  Service: Neurosurgery;  Laterality: Left;   CHOLECYSTECTOMY     COLONOSCOPY W/ POLYPECTOMY  08/30/2012   Colonic polyp status post polypectomy.  Pancolonic diverticulosis predominantly in the sigmoid colon. Small internal hemorrhoids. Moderate internal hemorhroids.    ESOPHAGOGASTRODUODENOSCOPY  08/04/2016   Schatzkis ring status post esophageal dilatation. Small hiatal hernia. Mild gastritis.    EYE SURGERY     cataract removal bilateral eyes   FOOT SURGERY     left heel   HARDWARE REMOVAL N/A 05/30/2020   Procedure: HARDWARE REMOVAL;  Surgeon: Eustace Moore, MD;  Location: [facility];  Service: Neurosurgery;  Laterality: N/A;   HERNIA REPAIR     LAMINECTOMY WITH POSTERIOR LATERAL ARTHRODESIS LEVEL 2 N/A 12/09/2019   Procedure: Laminectomy and Foraminotomy - Lumbar one-two, Lumbar two-three, posterolateral instrumented fusion Lumbar one-three, removal of instrumentation Lumbar three-five;  Surgeon: Eustace Moore, MD;  Location: [facility];  Service: Neurosurgery;  Laterality: N/A;   LEFT HEART  CATH AND CORONARY ANGIOGRAPHY N/A 08/03/2019   Procedure: LEFT HEART CATH AND CORONARY ANGIOGRAPHY;  Surgeon: Jettie Booze, MD;  Location: [facility];  Service: Cardiovascular;  Laterality: N/A;   SHOULDER SURGERY     bilateral   WISDOM TOOTH EXTRACTION     Family History  Problem Relation Age of Onset   Colon cancer Neg Hx    Esophageal cancer Neg Hx    Rectal cancer Neg Hx    Stomach cancer Neg Hx    Colon polyps Neg Hx    Social History   Socioeconomic History   Marital status: Widowed    Spouse name: Not on file   Number of children: 2   Years of education: Not on file   Highest education level: Not on file  Occupational History   Not on file  Tobacco Use   Smoking status: Former    Packs/day: 0.50    Years: 35.00    Total pack years: 17.50    Types: Cigarettes    Quit date: 02/10/1993    Years since quitting: 29.0   Smokeless tobacco: Former    Types: Chew    Quit date: 02/10/2006  Vaping Use   Vaping Use: Never used  Substance and Sexual Activity   Alcohol use: Not Currently    Alcohol/week: 1.0 standard drink of alcohol    Types: 1 Cans of beer per week    Comment: occ   Drug use: No   Sexual activity: Yes    Partners: Female  Other Topics Concern   Not on file  Social History Narrative   Not on file   Social Determinants of Health   Financial Resource Strain: Low Risk  (01/17/2021)   Overall Financial Resource Strain (CARDIA)    Difficulty of Paying Living Expenses: Not hard at all  Food Insecurity: No Food Insecurity (01/17/2021)   Hunger Vital Sign    Worried About Running Out of Food in the Last Year: Never true    [place] in the Last Year: Never true  Transportation Needs: No Transportation Needs (01/17/2021)   PRAPARE - Hydrologist (Medical): No    Lack of Transportation (Non-Medical): No  Physical Activity: Inactive (01/17/2021)   Exercise Vital Sign    Days of Exercise per Week: 0 days     Minutes of Exercise per Session: 0 min  Stress: No Stress Concern Present (  01/17/2021)   [place]    Feeling of Stress : Not at all  Social Connections: Moderately Isolated (01/17/2021)   Social Connection and Isolation Panel [NHANES]    Frequency of Communication with Friends and Family: More than three times a week    Frequency of Social Gatherings with Friends and Family: Three times a week    Attends Religious Services: More than 4 times per year    Active Member of Clubs or Organizations: No    Attends Archivist Meetings: Never    Marital Status: Widowed    Tobacco Counseling Counseling given: Not Answered   Clinical Intake:  Pre-visit preparation completed: Yes Pain : No/denies pain Pain Score: 0-No pain   Nutritional Status: BMI 25 -29 Overweight Nutritional Risks: None Diabetes: Yes (last A1C 6.1) CBG done?: No Did pt. bring in CBG monitor from home?: No How often do you need to have someone help you when you read instructions, pamphlets, or other written materials from your doctor or pharmacy?: 3 - Sometimes Interpreter Needed?: No    Activities of Daily Living    01/25/2022    8:43 AM  In your present state of health, do you have any difficulty performing the following activities:  Hearing? 0  Vision? 0  Difficulty concentrating or making decisions? 0  Walking or climbing stairs? 0  Dressing or bathing? 0  Doing errands, shopping? 0  Preparing Food and eating ? N  Using the Toilet? N  In the past six months, have you accidently leaked urine? Y  Do you have problems with loss of bowel control? N  Managing your Medications? N  Managing your Finances? N  Housekeeping or managing your Housekeeping? N    Patient Care Team: Rochel Brome, MD as PCP - General (Family Medicine) Revankar, Reita Cliche, MD as Consulting Physician (Cardiology) Dawley, Theodoro Doing, DO as Consulting  Physician Eustace Moore, MD as Consulting Physician (Neurosurgery)     Assessment:   This is a routine wellness examination for [name].  Hearing/Vision screen No results found.  Dietary issues and exercise activities discussed: Current Exercise Habits: Home exercise routine, Intensity: Mild  Depression Screen    01/07/2022   10:34 AM 01/07/2022    7:56 AM 01/07/2022    7:34 AM 03/27/2021    8:03 AM 01/07/2021    1:56 PM 09/24/2020    1:27 PM 07/11/2020    7:55 AM  PHQ 2/9 Scores  PHQ - 2 Score 0 0 0 0 0 0 0    Fall Risk    01/25/2022    8:43 AM 01/07/2022    7:55 AM 01/07/2022    7:34 AM 09/24/2021    7:59 AM 03/27/2021    8:02 AM  Fall Risk   Falls in the past year? 0 0 0 0 1  Number falls in past yr: 0 0 0 0 0  Injury with Fall? 0 0 0 0 0  Risk for fall due to : No Fall Risks No Fall Risks No Fall Risks No Fall Risks   Follow up Falls evaluation completed;Education provided Falls evaluation completed;Falls prevention discussed Falls evaluation completed Falls evaluation completed Falls evaluation completed    FALL RISK PREVENTION PERTAINING TO THE HOME:  Any stairs in or around the home? No  If so, are there any without handrails? No  Home free of loose throw rugs in walkways, pet beds, electrical cords, etc? Yes  Adequate lighting in  your home to reduce risk of falls? Yes   ASSISTIVE DEVICES UTILIZED TO PREVENT FALLS:  Use of a cane, walker or w/c? No   Gait slow and steady without use of assistive device  Cognitive Function:        02/21/2022    9:32 AM 01/17/2021   11:51 AM 01/16/2020    9:30 AM  6CIT Screen  What Year? 0 points 0 points 0 points  What month? 0 points 0 points 0 points  What time? 0 points 0 points 0 points  Count back from 20 0 points 0 points 0 points  Months in reverse 0 points 0 points 0 points  Repeat phrase 2 points 2 points 0 points  Total Score 2 points 2 points 0 points    Immunizations Immunization History   Administered Date(s) Administered   COVID-19, mRNA, vaccine(Comirnaty)12 years and older 01/07/2022   Fluad Quad(high Dose 65+) 12/02/2019, 01/17/2021, 01/07/2022   Influenza-Unspecified 12/11/2017, 11/19/2018   Moderna Covid-19 Vaccine Bivalent Booster 36yr & up 03/27/2021   Moderna SARS-COV2 Booster Vaccination 07/11/2020   Moderna Sars-Covid-2 Vaccination 03/21/2019, 04/18/2019   Pneumococcal Conjugate-13 09/16/2013   Pneumococcal Polysaccharide-23 08/07/2017   Zoster, Live 09/16/2013    TDAP status: Due, Education has been provided regarding the importance of this vaccine. Advised may receive this vaccine at local pharmacy or Health Dept. Aware to provide a copy of the vaccination record if obtained from local pharmacy or Health Dept. Verbalized acceptance and understanding.  Flu Vaccine status: Up to date  Pneumococcal vaccine status: Up to date  Covid-19 vaccine status: Completed vaccines  Qualifies for Shingles Vaccine? Yes   Zostavax completed Yes   Shingrix Completed?: No.    Education has been provided regarding the importance of this vaccine. Patient has been advised to call insurance company to determine out of pocket expense if they have not yet received this vaccine. Advised may also receive vaccine at local pharmacy or Health Dept. Verbalized acceptance and understanding.  Screening Tests Health Maintenance  Topic Date Due   OPHTHALMOLOGY EXAM  Never done   Diabetic kidney evaluation - Urine ACR  Never done   DTaP/Tdap/Td (1 - Tdap) Never done   Zoster Vaccines- Shingrix (1 of 2) Never done   Medicare Annual Wellness (AWV)  01/17/2022   COVID-19 Vaccine (5 - 2023-24 season) 03/04/2022   HEMOGLOBIN A1C  07/08/2022   Diabetic kidney evaluation - eGFR measurement  01/08/2023   Pneumonia Vaccine 82 Years old  Completed   INFLUENZA VACCINE  Completed   HPV VACCINES  Aged Out   FOOT EXAM  Discontinued    Health Maintenance  Health Maintenance Due  Topic Date  Due   OPHTHALMOLOGY EXAM  Never done   Diabetic kidney evaluation - Urine ACR  Never done   DTaP/Tdap/Td (1 - Tdap) Never done   Zoster Vaccines- Shingrix (1 of 2) Never done   Medicare Annual Wellness (AWV)  01/17/2022    Colorectal cancer screening: No longer required.   Lung Cancer Screening: (Low Dose CT Chest recommended if Age 84-80years, 30 pack-year currently smoking OR have quit w/in 15years.) does not qualify.   Additional Screening:  Vision Screening: Recommended annual ophthalmology exams for early detection of glaucoma and other disorders of the eye. Is the patient up to date with their annual eye exam?  Yes  Who is the provider or what is the name of the office in which the patient attends annual eye exams? [facility]  Dental Screening:  Recommended annual dental exams for proper oral hygiene  Community Resource Referral / Chronic Care Management: CRR required this visit?  No   CCM required this visit?  No      Plan:     I have personally reviewed and noted the following in the patient's chart:   Medical and social history Use of alcohol, tobacco or illicit drugs  Current medications and supplements including opioid prescriptions.  Functional ability and status Nutritional status Physical activity Advanced directives List of other physicians Hospitalizations, surgeries, and ER visits in previous 12 months Vitals Screenings to include cognitive, depression, and falls Referrals and appointments  In addition, I have reviewed and discussed with patient certain preventive protocols, quality metrics, and best practice recommendations. A written personalized care plan for preventive services as well as general preventive health recommendations were provided to patient.     Erie Noe, LPN   8/33/8250

## 2022-02-23 ENCOUNTER — Other Ambulatory Visit: Payer: Self-pay | Admitting: Family Medicine

## 2022-02-24 ENCOUNTER — Other Ambulatory Visit: Payer: Self-pay | Admitting: Family Medicine

## 2022-03-19 DIAGNOSIS — C662 Malignant neoplasm of left ureter: Secondary | ICD-10-CM | POA: Diagnosis not present

## 2022-03-19 DIAGNOSIS — D494 Neoplasm of unspecified behavior of bladder: Secondary | ICD-10-CM | POA: Diagnosis not present

## 2022-03-19 DIAGNOSIS — C679 Malignant neoplasm of bladder, unspecified: Secondary | ICD-10-CM | POA: Diagnosis not present

## 2022-03-19 DIAGNOSIS — Z79899 Other long term (current) drug therapy: Secondary | ICD-10-CM | POA: Diagnosis not present

## 2022-03-19 DIAGNOSIS — N138 Other obstructive and reflux uropathy: Secondary | ICD-10-CM | POA: Diagnosis not present

## 2022-03-22 ENCOUNTER — Other Ambulatory Visit: Payer: Self-pay | Admitting: Family Medicine

## 2022-03-22 DIAGNOSIS — K219 Gastro-esophageal reflux disease without esophagitis: Secondary | ICD-10-CM

## 2022-04-14 DIAGNOSIS — C678 Malignant neoplasm of overlapping sites of bladder: Secondary | ICD-10-CM | POA: Diagnosis not present

## 2022-04-15 ENCOUNTER — Ambulatory Visit: Payer: Medicare PPO | Admitting: Family Medicine

## 2022-04-19 IMAGING — XA DG MYELOGRAPHY LUMBAR INJ LUMBOSACRAL
10 series · 10 of 10 positions shown · non-contrast
Comparison: Previous myelogram 11/21/2019

CLINICAL DATA: Back pain. Right hip and thigh pain. No left-sided
complaints. Pain does not tend to extend below the knee.
TECHNIQUE: Contiguous axial images were obtained through the Lumbar spine after
the intrathecal infusion of infusion. Coronal and sagittal
reconstructions were obtained of the axial image sets.

[Series 1: w lumbar spine lat · 0.15mm/px · 1 of 1 slices shown]
[im 1/1]
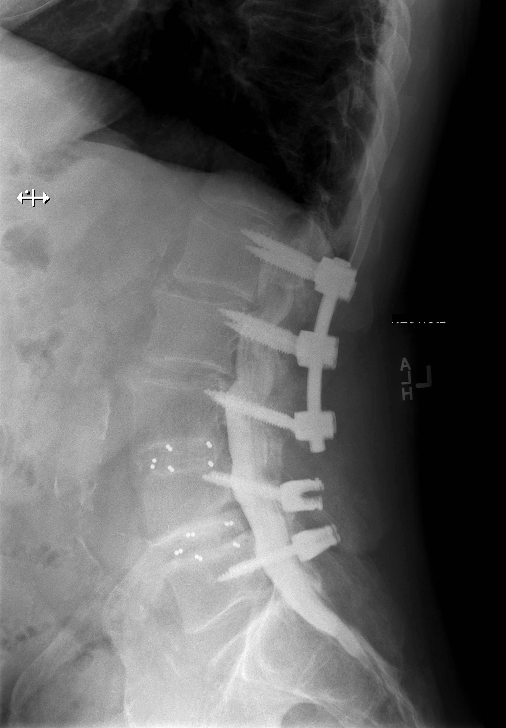

[Series 2: w lumbar spine flexion · 0.15mm/px · 1 of 1 slices shown]
[im 1/1]
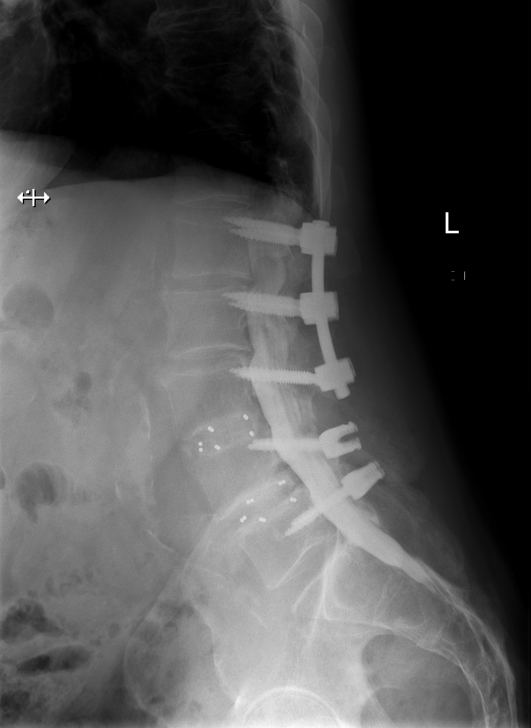

[Series 3: w lumbar spine extension · 0.15mm/px · 1 of 1 slices shown]
[im 1/1]
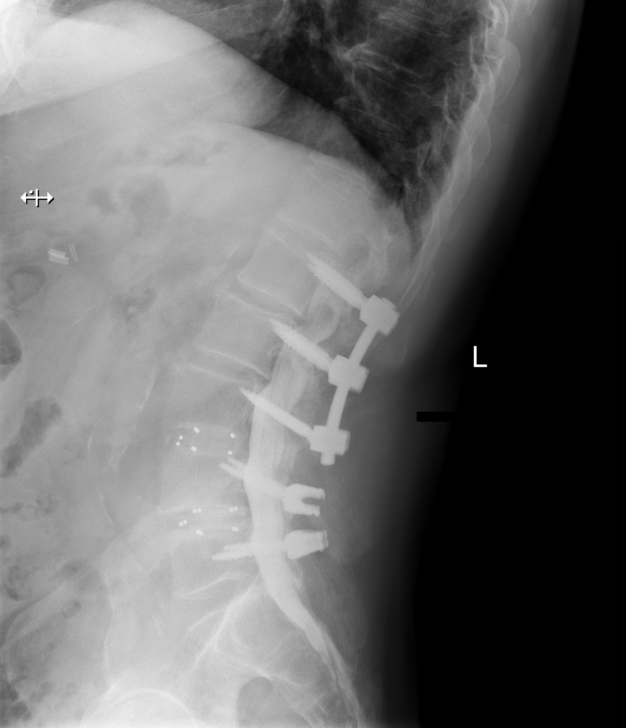

[Series 16: vasc standard · 1 of 1 slices shown (1 of 7)]
[im 1/1]
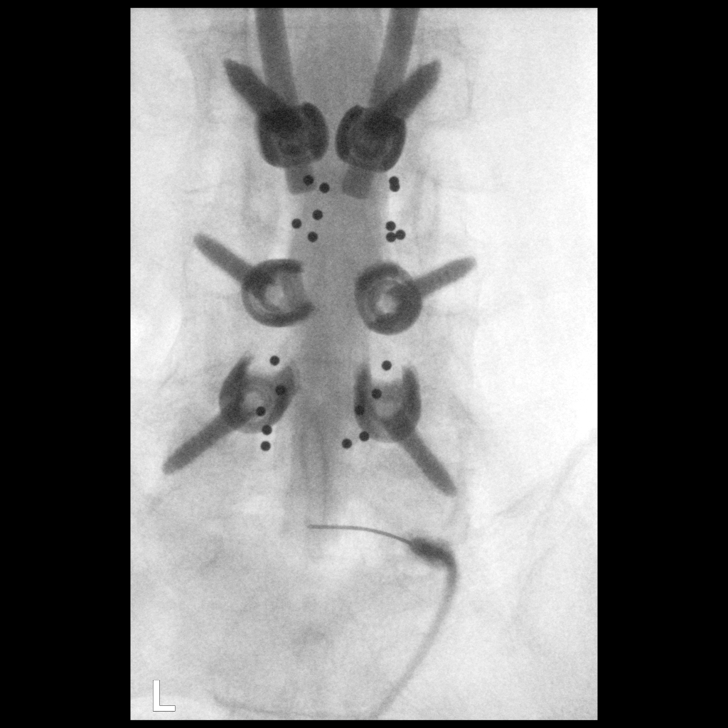

[Series 17: vasc standard · 1 of 1 slices shown (2 of 7)]
[im 1/1]
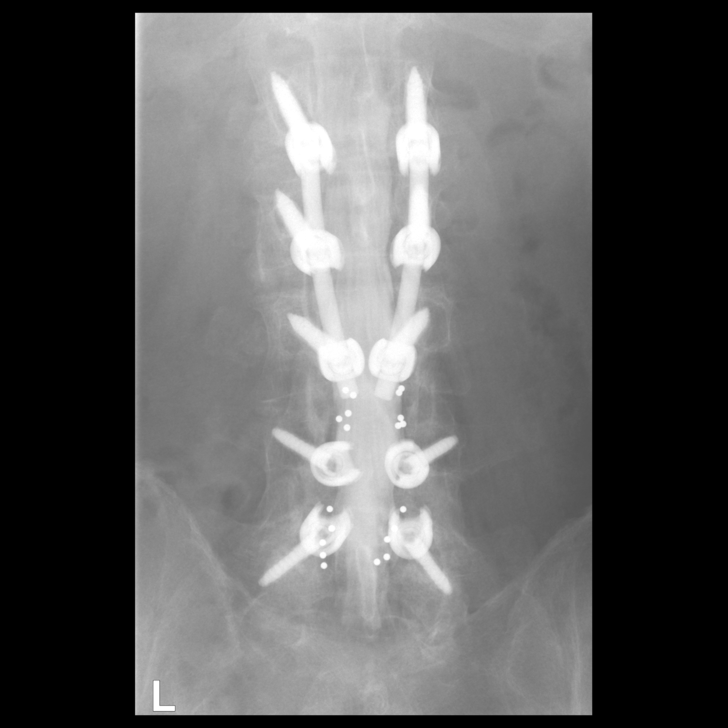

[Series 18: vasc standard · 1 of 1 slices shown (3 of 7)]
[im 1/1]
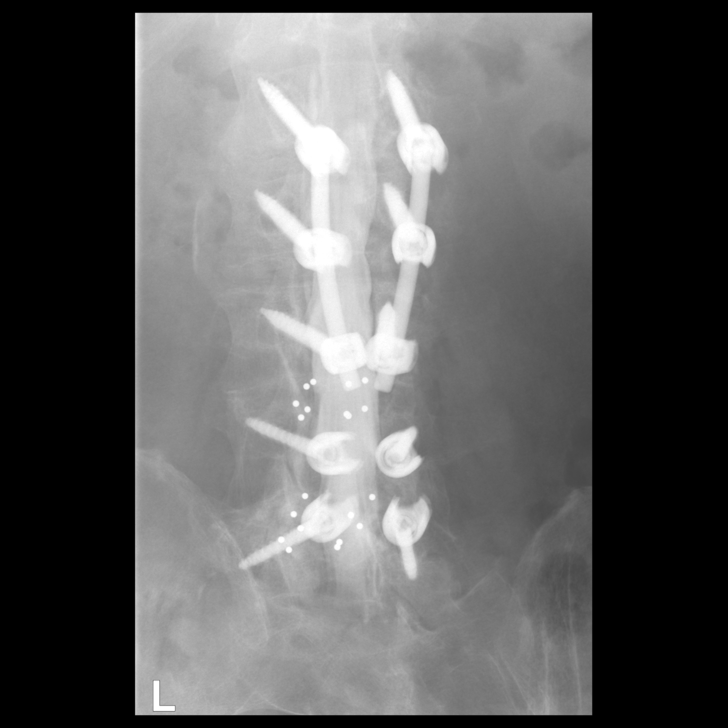

[Series 19: vasc standard · 1 of 1 slices shown (4 of 7)]
[im 1/1]
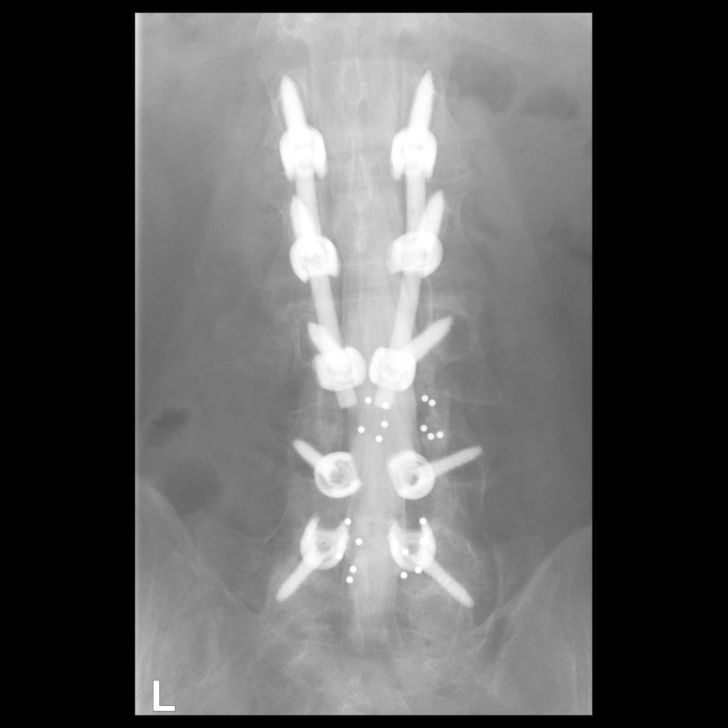

[Series 20: vasc standard · 1 of 1 slices shown (5 of 7)]
[im 1/1]
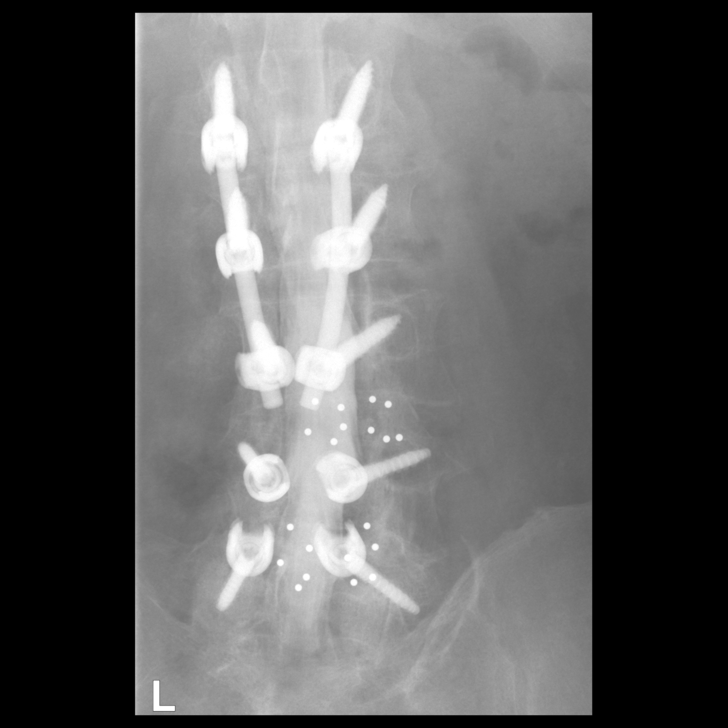

[Series 21: vasc standard · 1 of 1 slices shown (6 of 7)]
[im 1/1]
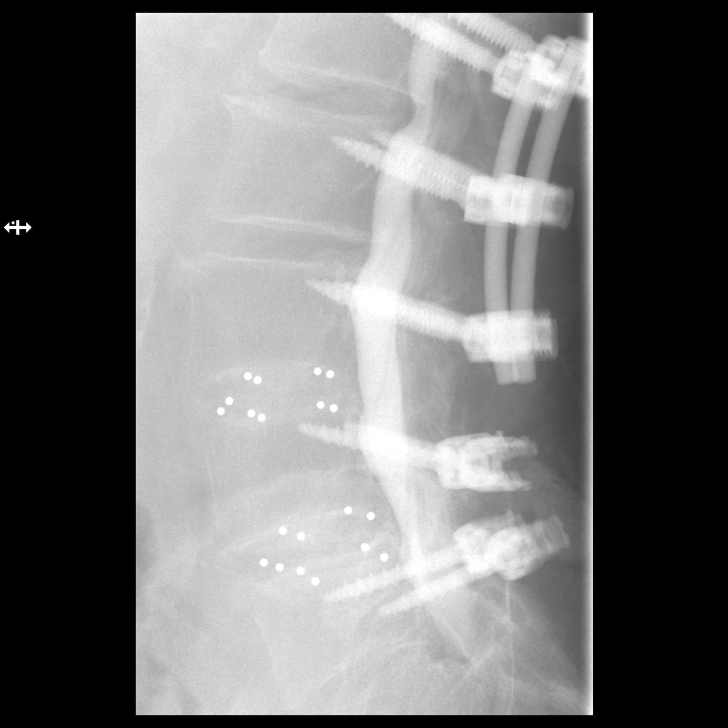

[Series 22: vasc standard · 1 of 1 slices shown (7 of 7)]
[im 1/1]
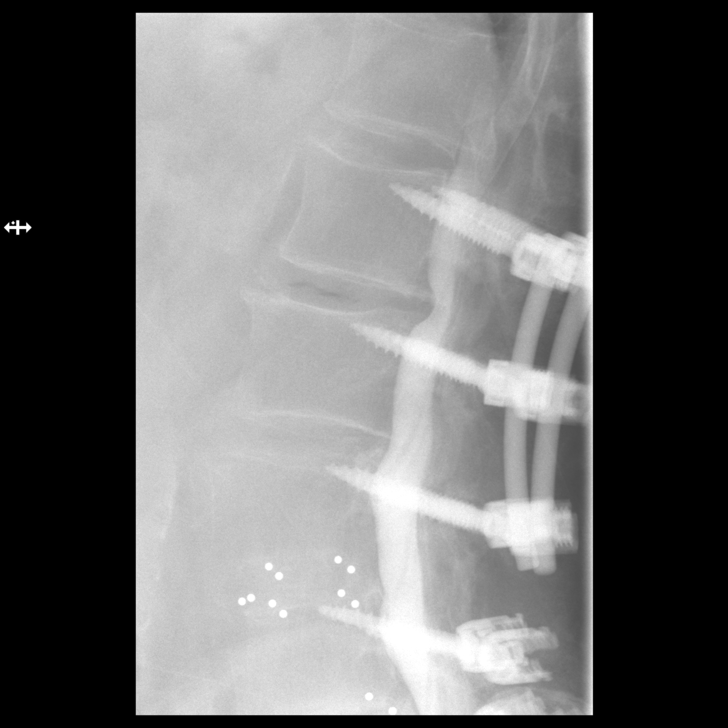

[10 of 10 positions shown; findings below may reference images not displayed]

EXAM:
LUMBAR MYELOGRAM

FLUOROSCOPY TIME:  1 minutes 9 seconds. 236.09 micro gray meter
squared

PROCEDURE:
After thorough discussion of risks and benefits of the procedure
including bleeding, infection, injury to nerves, blood vessels,
adjacent structures as well as headache and CSF leak, written and
oral informed consent was obtained. Consent was obtained by Dr. Tavares
Loritz. Time out form was completed.

Patient was positioned prone on the fluoroscopy table. Local
anesthesia was provided with 1% lidocaine without epinephrine after
prepped and draped in the usual sterile fashion. Puncture was
performed at L5-S1 using a 3 1/2 inch 22-gauge spinal needle via
right paramedian approach. Using a single pass through the dura, the
needle was placed within the thecal sac, with return of clear CSF.
15 mL of Isovue E-TBB was injected into the thecal sac, with normal
opacification of the nerve roots and cauda equina consistent with
free flow within the subarachnoid space.

I personally performed the lumbar puncture and administered the
intrathecal contrast. I also personally performed acquisition of the
myelogram images.
FINDINGS: LUMBAR MYELOGRAM FINDINGS:

Previous discectomy and fusion procedure from L3 through L5.
Interval extension of the fusion from L1 through L3 with pedicle
screws and posterior rods. Flexion extension views do not
demonstrate any motion in the fusion segment. No abnormal finding
above or below.

Wide patency of the central canal. No sign of focal nerve root
compression. See results of CT study.

CT LUMBAR MYELOGRAM FINDINGS:

T11-12 and T12-L1: No bulge or herniation. No stenosis. Distal cord
and conus are normal.

L1-2: Nonunion. Screw loosening bilaterally at L1 with pronounced
lucency around the hardware. Screws at L2 do not show motion or
loosening. Bulging of the disc at the L1-2 level, more to the right,
but no canal or lateral recess stenosis. Mild foraminal narrowing
right more than left.

L2-3: Pedicle screws well positioned without evidence of motion or
loosening. Minimal bulging of the disc. No stenosis of the canal or
foramina.

L3 through L5: Previous discectomy and fusion procedure. Solid union
throughout that segment. No hardware complication. Wide patency of
the canal and foramina.

L5-S1: Minimal bulging of the disc. Minimal facet osteoarthritis. No
canal or foraminal stenosis.

Sacroiliac joints show mild bilateral osteoarthritis.
IMPRESSION: 1. Nonunion at L1-2. Screw loosening bilaterally at L1 with
pronounced lucency around the hardware. Bulging of the disc at L1-2,
more to the right. Mild foraminal narrowing right more than left
which could conceivably affect the right L1 nerve.
2. No evidence of motion at the L2-3 level. Sufficient patency of
the canal and foramina.
3. Solid union from L3 through L5. No hardware complication. No
stenosis.
4. L5-S1: Minimal disc bulge and minimal facet osteoarthritis. No
stenosis.
5. Mild bilateral sacroiliac osteoarthritis.

## 2022-04-24 ENCOUNTER — Encounter: Payer: Self-pay | Admitting: Family Medicine

## 2022-04-24 ENCOUNTER — Ambulatory Visit: Payer: Medicare PPO | Admitting: Family Medicine

## 2022-04-24 VITALS — BP 102/70 | HR 87 | Temp 97.3°F | Ht 71.0 in | Wt 180.0 lb

## 2022-04-24 DIAGNOSIS — R252 Cramp and spasm: Secondary | ICD-10-CM

## 2022-04-24 DIAGNOSIS — I110 Hypertensive heart disease with heart failure: Secondary | ICD-10-CM

## 2022-04-24 DIAGNOSIS — R7301 Impaired fasting glucose: Secondary | ICD-10-CM

## 2022-04-24 DIAGNOSIS — E782 Mixed hyperlipidemia: Secondary | ICD-10-CM

## 2022-04-24 DIAGNOSIS — I5032 Chronic diastolic (congestive) heart failure: Secondary | ICD-10-CM | POA: Diagnosis not present

## 2022-04-24 DIAGNOSIS — K219 Gastro-esophageal reflux disease without esophagitis: Secondary | ICD-10-CM | POA: Diagnosis not present

## 2022-04-24 MED ORDER — TAMSULOSIN HCL 0.4 MG PO CAPS: 0.4000 mg | ORAL_CAPSULE | Freq: Every day | ORAL | 1 refills | Status: AC

## 2022-04-24 NOTE — Progress Notes (Unsigned)
Subjective:  Patient ID: [name], male    DOB: [birth year]  Age: 82 y.o. MRN: [id number]  Chief Complaint  Patient presents with   Hyperlipidemia   Hypertension   HPI: Prediabetes: Taking Metformin 500 mg daily. Last a1c 6.1    GERD: Taking Omeprazole 40 mg daily.    Hyperlipidemia: Current medications: Atorvastatin - unsure if taking (no filled for 6 months), Fish Oil 1200 mg every morning.   Hypertension: Current medications: Amlodipine 5 mg daily, Aspirin 81 mg daily, Clonidine 0.1 mg daily AS NEEDED if over 170. Lisinopril 20 mg daily.    Bladder cancer: Dr. 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 Dr Johnston Ebbs is urologist. Bx last week showed no cancer cells.  Chemo x 2 more months and then repeat cystoscopy and biopsy.     04/24/2022    1:59 PM 01/07/2022   10:34 AM 01/07/2022    7:56 AM 01/07/2022    7:34 AM 03/27/2021    8:03 AM  Depression screen PHQ 2/9  Decreased Interest 0 0 0 0 0  Down, Depressed, Hopeless 0 0 0 0 0  PHQ - 2 Score 0 0 0 0 0         09/24/2021    7:59 AM 01/07/2022    7:34 AM 01/07/2022    7:55 AM 01/25/2022    8:43 AM 04/24/2022    1:59 PM  Fall Risk  Falls in the past year? 0 0 0 0 0  Was there an injury with Fall? 0 0 0 0 0  Fall Risk Category Calculator 0 0 0 0 0  Fall Risk Category (Retired) Low Low Low Low   (RETIRED) Patient Fall Risk Level Low fall risk Low fall risk Moderate fall risk Low fall risk   Patient at Risk for Falls Due to No Fall Risks No Fall Risks No Fall Risks No Fall Risks No Fall Risks  Fall risk Follow up Falls evaluation completed Falls evaluation completed Falls evaluation completed;Falls prevention discussed Falls evaluation completed;Education provided Falls evaluation completed      Review of Systems  Constitutional:  Negative for chills, diaphoresis, fatigue and fever.  HENT:  Negative for congestion, ear pain and sore throat.   Respiratory:  Negative for cough and shortness of breath.    Cardiovascular:  Negative for chest pain and leg swelling.  Gastrointestinal:  Negative for abdominal pain, constipation, diarrhea, nausea and vomiting.  Genitourinary:  Negative for dysuria and urgency.       Bladder Cancer  Musculoskeletal:  Negative for arthralgias and myalgias.  Neurological:  Negative for dizziness and headaches.  Psychiatric/Behavioral:  Negative for dysphoric mood.     Current Outpatient Medications on File Prior to Visit  Medication Sig Dispense Refill   aspirin EC (ASPIRIN LOW DOSE) 81 MG tablet Take 1 tablet (81 mg total) by mouth daily. Swallow whole. 90 tablet 3   atorvastatin (LIPITOR) 40 MG tablet TAKE 1 TABLET EVERY DAY  (NEW DOSE) (Patient taking differently: Take 40 mg by mouth daily.) 90 tablet 1   carboxymethylcellulose (REFRESH PLUS) 0.5 % SOLN Place 1 drop into both eyes 3 (three) times daily as needed for dry eyes (dry/irritated eyes).     cholecalciferol (VITAMIN D) 1000 UNITS tablet Take 1,000 Units by mouth in the morning.     lisinopril (ZESTRIL) 20 MG tablet TAKE 1 TABLET BY MOUTH EVERY DAY 90 tablet 1   metFORMIN (GLUCOPHAGE) 500 MG tablet TAKE 1 TABLET (500 MG TOTAL) BY MOUTH DAILY. [place]  tablet 1   nitroGLYCERIN (NITROSTAT) 0.4 MG SL tablet Place 1 tablet (0.4 mg total) under the tongue every 5 (five) minutes x 3 doses as needed for chest pain. 25 tablet 12   Omega-3 Fatty Acids (FISH OIL) 1200 MG CAPS Take 1,200 mg by mouth in the morning.     No current facility-administered medications on file prior to visit.   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 06/17/2016   Deficiency of other specified B group vitamins 05/20/2019   Depression 04/04/2019   Diabetes mellitus due to underlying condition with unspecified complications ([place]) 0000000   Diabetes mellitus without complication ([place])    Type II   Diarrhea 11/21/2018   Dyslipidemia 06/17/2016   Dyspnea on exertion 06/28/2019   Dysrhythmia    Essential (primary) hypertension 06/17/2016   Essential hypertension 06/17/2016   GERD (gastroesophageal reflux disease)    on meds   Greater trochanteric pain syndrome 07/03/2020   Headache    History of hiatal hernia    History of kidney stones    History of prostate cancer 05/20/2019   Hypercholesteremia 04/04/2019   Hyperlipemia    Hypertension    Impaired fasting blood sugar 05/20/2019   Impingement syndrome of shoulder region 01/12/2018   Impingement syndrome of shoulder region 01/12/2018   Low back pain 02/20/2014   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   Lumbar radiculopathy 05/24/2019   Malignant neoplasm prostate ([place]) 05/20/2019   Medicare annual wellness visit, subsequent 01/16/2020   Mixed hyperlipidemia 08/31/2019   Neck pain 07/03/2015   Paresthesia of hand 01/12/2018   Paresthesia of upper limb 01/12/2018   Paroxysmal atrial fibrillation ([place]) 04/04/2019   PONV (postoperative nausea and vomiting)    Pyelonephritis 06/08/2021   S/P cervical spinal fusion 03/04/2018   S/P lumbar fusion 12/09/2019   S/P lumbar spinal fusion 09/29/2013   S/P lumbar spinal fusion 09/29/2013   Sepsis ([place]) 11/22/2018   Shoulder pain 01/12/2018   Spinal 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 01/26/2018   Stenosis of intervertebral foramina 01/12/2018   Trochanteric bursitis of right hip 04/19/2020   Past Surgical History:  Procedure Laterality Date   anal fissures     ANTERIOR CERVICAL DECOMP/DISCECTOMY FUSION N/A 03/04/2018   Procedure:  Anterior Cervical Decompression Fusion - Cervical three-Cervical four - Cervical four-Cervical five;  Surgeon: Eustace Moore, MD;  Location: [facility];  Service: Neurosurgery;  Laterality: N/A;   ANTERIOR LAT LUMBAR FUSION N/A 05/30/2020   Procedure: Anterior Lateral Lumbar Fusion Lumbar One-Two with Lateral Plate Removal of pedicle screws Lumbar One and Posterior Lateral Fusion Lumbar One-Lumbar Three;  Surgeon: Eustace Moore, MD;  Location: [facility];  Service: Neurosurgery;  Laterality: N/A;   BACK SURGERY     CARDIAC CATHETERIZATION     no PCI   CARPAL TUNNEL RELEASE Left 03/04/2018   Procedure: Carpal Tunnel Release - left;  Surgeon: Eustace Moore, MD;  Location: [facility];  Service:  Neurosurgery;  Laterality: Left;   CHOLECYSTECTOMY     COLONOSCOPY W/ POLYPECTOMY  08/30/2012   Colonic polyp status post polypectomy.  Pancolonic diverticulosis predominantly in the sigmoid colon. Small internal hemorrhoids. Moderate internal hemorhroids.    ESOPHAGOGASTRODUODENOSCOPY  08/04/2016   Schatzkis ring status post esophageal dilatation. Small hiatal hernia. Mild gastritis.    EYE SURGERY     cataract removal bilateral eyes   FOOT SURGERY     left heel   HARDWARE REMOVAL N/A 05/30/2020   Procedure: HARDWARE REMOVAL;  Surgeon: Eustace Moore, MD;  Location: [facility];  Service: Neurosurgery;  Laterality: N/A;   HERNIA REPAIR     LAMINECTOMY WITH POSTERIOR LATERAL ARTHRODESIS LEVEL 2 N/A 12/09/2019   Procedure: Laminectomy and Foraminotomy - Lumbar one-two, Lumbar two-three, posterolateral instrumented fusion Lumbar one-three, removal of instrumentation Lumbar three-five;  Surgeon: Eustace Moore, MD;  Location: [facility];  Service: Neurosurgery;  Laterality: N/A;   LEFT HEART CATH AND CORONARY ANGIOGRAPHY N/A 08/03/2019   Procedure: LEFT HEART CATH AND CORONARY ANGIOGRAPHY;  Surgeon: Jettie Booze, MD;  Location: [facility];  Service: Cardiovascular;  Laterality: N/A;   SHOULDER SURGERY     bilateral    WISDOM TOOTH EXTRACTION      Family History  Problem Relation Age of Onset   Colon cancer Neg Hx    Esophageal cancer Neg Hx    Rectal cancer Neg Hx    Stomach cancer Neg Hx    Colon polyps Neg Hx    Social History   Socioeconomic History   Marital status: Widowed    Spouse name: Not on file   Number of children: 2   Years of education: Not on file   Highest education level: Not on file  Occupational History   Not on file  Tobacco Use   Smoking status: Former    Packs/day: 0.50    Years: 35.00    Additional pack years: 0.00    Total pack years: 17.50    Types: Cigarettes    Quit date: 02/10/1993    Years since quitting: 29.2   Smokeless tobacco: Former    Types: Chew    Quit date: 02/10/2006  Vaping Use   Vaping Use: Never used  Substance and Sexual Activity   Alcohol use: Not Currently    Alcohol/week: 1.0 standard drink of alcohol    Types: 1 Cans of beer per week    Comment: occ   Drug use: No   Sexual activity: Yes    Partners: Female  Other Topics Concern   Not on file  Social History Narrative   Not on file   Social Determinants of Health   Financial Resource Strain: Low Risk  (01/17/2021)   Overall Financial Resource Strain (CARDIA)    Difficulty of Paying Living Expenses: Not hard at all  Food Insecurity: No Food Insecurity (01/17/2021)   Hunger Vital Sign    Worried About Running Out of Food in the Last Year: Never true    [place] in the Last Year: Never true  Transportation Needs: No Transportation Needs (01/17/2021)   PRAPARE - Hydrologist (Medical): No    Lack of Transportation (Non-Medical): No  Physical Activity: Inactive (01/17/2021)   Exercise Vital Sign    Days of Exercise per Week: 0 days    Minutes of Exercise per Session: 0 min  Stress: No Stress Concern Present (01/17/2021)   [place] -  Occupational Stress Questionnaire    Feeling of Stress : Not at all  Social  Connections: Moderately Isolated (01/17/2021)   Social Connection and Isolation Panel [NHANES]    Frequency of Communication with Friends and Family: More than three times a week    Frequency of Social Gatherings with Friends and Family: Three times a week    Attends Religious Services: More than 4 times per year    Active Member of Clubs or Organizations: No    Attends Archivist Meetings: Never    Marital Status: Widowed    Objective:  BP 102/70   Pulse 87   Temp (!) 97.3 F (36.3 C)   Ht 5\' 11"  (1.803 m)   Wt 180 lb (81.6 kg)   SpO2 97%   BMI 25.10 kg/m      04/25/2022   11:02 AM 04/24/2022    1:56 PM 01/29/2022    9:25 AM  BP/Weight  Systolic BP 123XX123 [id number] 0000000  Diastolic BP 64 70 72  Wt. (Lbs) 190 180 189  BMI 26.5 kg/m2 25.1 kg/m2 26.36 kg/m2    Physical Exam Vitals reviewed.  Constitutional:      Appearance: Normal appearance.  Neck:     Vascular: No carotid bruit.  Cardiovascular:     Rate and Rhythm: Normal rate and regular rhythm.     Heart sounds: Normal heart sounds.  Pulmonary:     Effort: Pulmonary effort is normal.     Breath sounds: Normal breath sounds. No wheezing, rhonchi or rales.  Abdominal:     General: Bowel sounds are normal.     Palpations: Abdomen is soft.     Tenderness: There is no abdominal tenderness.  Neurological:     Mental Status: He is alert and oriented to person, place, and time.  Psychiatric:        Mood and Affect: Mood normal.        Behavior: Behavior normal.     Diabetic Foot Exam - Simple   No data filed      Lab Results  Component Value Date   WBC 5.5 04/25/2022   HGB 15.7 04/25/2022   HCT 47.0 04/25/2022   PLT 199 04/25/2022   GLUCOSE 97 04/25/2022   CHOL 180 04/25/2022   TRIG 157 (H) 04/25/2022   HDL 45 04/25/2022   LDLCALC 107 (H) 04/25/2022   ALT 21 04/25/2022   AST 22 04/25/2022   NA 139 04/25/2022   K 4.9 04/25/2022   CL 100 04/25/2022   CREATININE 1.02 04/25/2022   BUN 13 04/25/2022    CO2 22 04/25/2022   TSH 2.880 04/25/2022   INR 0.9 05/29/2020   HGBA1C 6.0 (H) 04/25/2022   MICROALBUR 30 07/11/2020      Assessment & Plan:    Hypertensive heart disease with chronic diastolic congestive heart failure ([place]) Assessment & Plan: Well controlled.  No changes to medicines. Amlodipine 5 mg daily, Aspirin 81 mg daily, Clonidine 0.1 mg daily PRN, Lisinopril 20 mg daily. Continue to work on eating a healthy diet and exercise.  Labs drawn today.   Orders: -     Comprehensive metabolic panel; Future  Gastroesophageal reflux disease without esophagitis Assessment & Plan: Continue Omeprazole 40 mg daily.   Impaired fasting blood sugar Assessment & Plan: Hemoglobin A1c 6.1, 3 month avg of blood sugars, is in prediabetic range.  In order to prevent progression to diabetes, recommend low carb diet and regular exercise   Orders: -  CBC with Differential/Platelet; Future -     Hemoglobin A1c; Future  Mixed hyperlipidemia Assessment & Plan: Well controlled.  No changes to medicines. Atorvastatin 40 mg daily, Fish Oil 1200 mg every morning. Continue to work on eating a healthy diet and exercise.  Labs drawn today.   Orders: -     Lipid panel; Future  Muscle cramp Assessment & Plan: Check labs.  Orders: -     Phosphorus; Future -     Magnesium; Future -     TSH; Future  Other orders -     Tamsulosin HCl; Take 1 capsule (0.4 mg total) by mouth daily after supper.  Dispense: 90 capsule; Refill: 1 -     Cardiovascular Risk Assessment     Meds ordered this encounter  Medications   tamsulosin (FLOMAX) 0.4 MG CAPS capsule    Sig: Take 1 capsule (0.4 mg total) by mouth daily after supper.    Dispense:  90 capsule    Refill:  1    Orders Placed This Encounter  Procedures   CBC with Differential/Platelet   Comprehensive metabolic panel   Hemoglobin A1c   Lipid panel   Phosphorus   Magnesium   TSH   Cardiovascular Risk Assessment     Follow-up:  Return in about 3 months (around 07/25/2022) for lab visit (tomorrow or next week), chronic fasting.  I,Marla I Leal-Borjas,acting as a scribe for Rochel Brome, MD.,have documented all relevant documentation on the behalf of Rochel Brome, MD,as directed by  Rochel Brome, MD while in the presence of Rochel Brome, MD.    An After Visit Summary was printed and given to the patient.  I attest that I have reviewed this visit and agree with the plan scribed by my staff.  Rochel Brome, MD [facility] [phone]

## 2022-04-25 ENCOUNTER — Ambulatory Visit: Payer: Medicare PPO | Attending: Cardiology | Admitting: Cardiology

## 2022-04-25 ENCOUNTER — Other Ambulatory Visit: Payer: Medicare PPO

## 2022-04-25 ENCOUNTER — Encounter: Payer: Self-pay | Admitting: Cardiology

## 2022-04-25 VITALS — BP 108/64 | HR 72 | Ht 71.0 in | Wt 190.0 lb

## 2022-04-25 DIAGNOSIS — C61 Malignant neoplasm of prostate: Secondary | ICD-10-CM | POA: Diagnosis not present

## 2022-04-25 DIAGNOSIS — I5032 Chronic diastolic (congestive) heart failure: Secondary | ICD-10-CM | POA: Diagnosis not present

## 2022-04-25 DIAGNOSIS — I2510 Atherosclerotic heart disease of native coronary artery without angina pectoris: Secondary | ICD-10-CM | POA: Diagnosis not present

## 2022-04-25 DIAGNOSIS — R7301 Impaired fasting glucose: Secondary | ICD-10-CM | POA: Diagnosis not present

## 2022-04-25 DIAGNOSIS — I480 Paroxysmal atrial fibrillation: Secondary | ICD-10-CM | POA: Diagnosis not present

## 2022-04-25 DIAGNOSIS — I110 Hypertensive heart disease with heart failure: Secondary | ICD-10-CM

## 2022-04-25 DIAGNOSIS — E782 Mixed hyperlipidemia: Secondary | ICD-10-CM | POA: Diagnosis not present

## 2022-04-25 DIAGNOSIS — C678 Malignant neoplasm of overlapping sites of bladder: Secondary | ICD-10-CM | POA: Diagnosis not present

## 2022-04-25 DIAGNOSIS — R252 Cramp and spasm: Secondary | ICD-10-CM | POA: Diagnosis not present

## 2022-04-25 NOTE — Progress Notes (Signed)
Cardiology Office Note:    Date:  04/25/2022   ID:  [name], DOB 1940-09-10, MRN [id number]  PCP:  Rochel Brome, MD  Cardiologist:  Jenne Campus, MD    Referring MD: Rochel Brome, MD   Chief Complaint  Patient presents with   Follow-up    History of Present Illness:    [name] is a 82 y.o. male  with past medical history significant for coronary artery disease, in June 2021 he had cardiac catheterization done which showed 30% stenosis of circumflex, 40% stenosis of proximal to mid LAD, ejection fraction was normal at that time.  He was also told to have aortic aneurysm after echocardiogram showing diameter 40 mm after that CT has been performed which showed normal size of the aorta.  He also have dyslipidemia.  I do see diagnosis of atrial fibrillation but I see no documentation for it.  Comes today to my office for follow-up.  Overall he is doing fine he is tolerating chemotherapy repeat with some difficulty but overall quite okay no chest pain no tightness no squeezing no pressure no burning in the chest chest tiredness and fatigue and he blames on chemotherapy and he is age  Past Medical History:  Diagnosis Date   Angina pectoris ([place]) 08/02/2019   Arthritis    on meds   Atrial fibrillation ([place])    Backache 08/17/2012   Body mass index (BMI) 26.0-26.9, adult 05/08/2020   Cancer ([place]) 10/2015   prostate cancer treated with radiation   Carpal tunnel syndrome 01/18/2018   Cataract    sx-bilaterally   Cervical spondylosis without myelopathy 09/24/2015   Chest tightness 06/28/2019   Chronic back pain 05/30/2013   Chronic low back pain 02/20/2014   Colitis 11/22/2018   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 06/17/2016   Deficiency of other specified B group vitamins 05/20/2019   Depression 04/04/2019   Diabetes mellitus due to underlying condition with unspecified complications ([place]) 0000000   Diabetes mellitus without  complication ([place])    Type II   Diarrhea 11/21/2018   Dyslipidemia 06/17/2016   Dyspnea on exertion 06/28/2019   Dysrhythmia    Essential (primary) hypertension 06/17/2016   Essential hypertension 06/17/2016   GERD (gastroesophageal reflux disease)    on meds   Greater trochanteric pain syndrome 07/03/2020   Headache    History of hiatal hernia    History of kidney stones    History of prostate cancer 05/20/2019   Hypercholesteremia 04/04/2019   Hyperlipemia    Hypertension    Impaired fasting blood sugar 05/20/2019   Impingement syndrome of shoulder region 01/12/2018   Impingement syndrome of shoulder region 01/12/2018   Low back pain 02/20/2014   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   Lumbar radiculopathy 05/24/2019   Malignant neoplasm prostate ([place]) 05/20/2019   Medicare annual wellness visit, subsequent 01/16/2020   Mixed hyperlipidemia 08/31/2019   Neck pain 07/03/2015   Paresthesia of hand 01/12/2018   Paresthesia of upper limb 01/12/2018   Paroxysmal atrial fibrillation ([place]) 04/04/2019   PONV (postoperative nausea and vomiting)    Pyelonephritis 06/08/2021   S/P cervical spinal fusion 03/04/2018   S/P lumbar fusion 12/09/2019   S/P lumbar spinal fusion 09/29/2013   S/P lumbar spinal fusion 09/29/2013   Sepsis ([place]) 11/22/2018   Shoulder pain 01/12/2018   Spinal 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 01/26/2018   Stenosis of intervertebral foramina 01/12/2018   Trochanteric bursitis of right hip 04/19/2020    Past Surgical  History:  Procedure Laterality Date   anal fissures     ANTERIOR CERVICAL DECOMP/DISCECTOMY FUSION N/A 03/04/2018   Procedure: Anterior Cervical Decompression Fusion - Cervical three-Cervical four - Cervical four-Cervical five;  Surgeon: Eustace Moore, MD;  Location: [facility];  Service: Neurosurgery;  Laterality: N/A;   ANTERIOR LAT LUMBAR FUSION N/A 05/30/2020   Procedure: Anterior Lateral Lumbar Fusion Lumbar One-Two with Lateral Plate Removal of pedicle screws Lumbar One and Posterior  Lateral Fusion Lumbar One-Lumbar Three;  Surgeon: Eustace Moore, MD;  Location: [facility];  Service: Neurosurgery;  Laterality: N/A;   BACK SURGERY     CARDIAC CATHETERIZATION     no PCI   CARPAL TUNNEL RELEASE Left 03/04/2018   Procedure: Carpal Tunnel Release - left;  Surgeon: Eustace Moore, MD;  Location: [facility];  Service: Neurosurgery;  Laterality: Left;   CHOLECYSTECTOMY     COLONOSCOPY W/ POLYPECTOMY  08/30/2012   Colonic polyp status post polypectomy.  Pancolonic diverticulosis predominantly in the sigmoid colon. Small internal hemorrhoids. Moderate internal hemorhroids.    ESOPHAGOGASTRODUODENOSCOPY  08/04/2016   Schatzkis ring status post esophageal dilatation. Small hiatal hernia. Mild gastritis.    EYE SURGERY     cataract removal bilateral eyes   FOOT SURGERY     left heel   HARDWARE REMOVAL N/A 05/30/2020   Procedure: HARDWARE REMOVAL;  Surgeon: Eustace Moore, MD;  Location: [facility];  Service: Neurosurgery;  Laterality: N/A;   HERNIA REPAIR     LAMINECTOMY WITH POSTERIOR LATERAL ARTHRODESIS LEVEL 2 N/A 12/09/2019   Procedure: Laminectomy and Foraminotomy - Lumbar one-two, Lumbar two-three, posterolateral instrumented fusion Lumbar one-three, removal of instrumentation Lumbar three-five;  Surgeon: Eustace Moore, MD;  Location: [facility];  Service: Neurosurgery;  Laterality: N/A;   LEFT HEART CATH AND CORONARY ANGIOGRAPHY N/A 08/03/2019   Procedure: LEFT HEART CATH AND CORONARY ANGIOGRAPHY;  Surgeon: Jettie Booze, MD;  Location: [facility];  Service: Cardiovascular;  Laterality: N/A;   SHOULDER SURGERY     bilateral   WISDOM TOOTH EXTRACTION      Current Medications: Current Meds  Medication Sig   aspirin EC (ASPIRIN LOW DOSE) 81 MG tablet Take 1 tablet (81 mg total) by mouth daily. Swallow whole.   atorvastatin (LIPITOR) 40 MG tablet TAKE 1 TABLET EVERY DAY  (NEW DOSE) (Patient taking differently: Take 40 mg by mouth daily.)   carboxymethylcellulose (REFRESH PLUS)  0.5 % SOLN Place 1 drop into both eyes 3 (three) times daily as needed for dry eyes (dry/irritated eyes).   cholecalciferol (VITAMIN D) 1000 UNITS tablet Take 1,000 Units by mouth in the morning.   lisinopril (ZESTRIL) 20 MG tablet TAKE 1 TABLET BY MOUTH EVERY DAY   metFORMIN (GLUCOPHAGE) 500 MG tablet TAKE 1 TABLET (500 MG TOTAL) BY MOUTH DAILY.   nitroGLYCERIN (NITROSTAT) 0.4 MG SL tablet Place 1 tablet (0.4 mg total) under the tongue every 5 (five) minutes x 3 doses as needed for chest pain.   Omega-3 Fatty Acids (FISH OIL) 1200 MG CAPS Take 1,200 mg by mouth in the morning.   tamsulosin (FLOMAX) 0.4 MG CAPS capsule Take 1 capsule (0.4 mg total) by mouth daily after supper.   [DISCONTINUED] albuterol (VENTOLIN HFA) 108 (90 Base) MCG/ACT inhaler Inhale 2 puffs into the lungs every 6 (six) hours as needed for wheezing or shortness of breath.   [DISCONTINUED] amLODipine (NORVASC) 5 MG tablet Take 5 mg by mouth daily.   [DISCONTINUED] cloNIDine (CATAPRES) 0.1 MG tablet  Take 0.1 mg by mouth as needed (Take when sbp 170).   [DISCONTINUED] cyclobenzaprine (FLEXERIL) 5 MG tablet Take 1 tablet (5 mg total) by mouth 3 (three) times daily as needed for muscle spasms.   [DISCONTINUED] omeprazole (PRILOSEC) 40 MG capsule TAKE 1 CAPSULE (40 MG TOTAL) BY MOUTH DAILY.   [DISCONTINUED] oxybutynin (DITROPAN-XL) 5 MG 24 hr tablet Take 5 mg by mouth daily.     Allergies:   Oxycodone   Social History   Socioeconomic History   Marital status: Widowed    Spouse name: Not on file   Number of children: 2   Years of education: Not on file   Highest education level: Not on file  Occupational History   Not on file  Tobacco Use   Smoking status: Former    Packs/day: 0.50    Years: 35.00    Additional pack years: 0.00    Total pack years: 17.50    Types: Cigarettes    Quit date: 02/10/1993    Years since quitting: 29.2   Smokeless tobacco: Former    Types: Chew    Quit date: 02/10/2006  Vaping Use   Vaping  Use: Never used  Substance and Sexual Activity   Alcohol use: Not Currently    Alcohol/week: 1.0 standard drink of alcohol    Types: 1 Cans of beer per week    Comment: occ   Drug use: No   Sexual activity: Yes    Partners: Female  Other Topics Concern   Not on file  Social History Narrative   Not on file   Social Determinants of Health   Financial Resource Strain: Low Risk  (01/17/2021)   Overall Financial Resource Strain (CARDIA)    Difficulty of Paying Living Expenses: Not hard at all  Food Insecurity: No Food Insecurity (01/17/2021)   Hunger Vital Sign    Worried About Running Out of Food in the Last Year: Never true    [place] in the Last Year: Never true  Transportation Needs: No Transportation Needs (01/17/2021)   PRAPARE - Hydrologist (Medical): No    Lack of Transportation (Non-Medical): No  Physical Activity: Inactive (01/17/2021)   Exercise Vital Sign    Days of Exercise per Week: 0 days    Minutes of Exercise per Session: 0 min  Stress: No Stress Concern Present (01/17/2021)   [place]    Feeling of Stress : Not at all  Social Connections: Moderately Isolated (01/17/2021)   Social Connection and Isolation Panel [NHANES]    Frequency of Communication with Friends and Family: More than three times a week    Frequency of Social Gatherings with Friends and Family: Three times a week    Attends Religious Services: More than 4 times per year    Active Member of Clubs or Organizations: No    Attends Archivist Meetings: Never    Marital Status: Widowed     Family History: The patient's family history is negative for Colon cancer, Esophageal cancer, Rectal cancer, Stomach cancer, and Colon polyps. ROS:   Please see the history of present illness.    All 14 point review of systems negative except as described per history of present illness  EKGs/Labs/Other  Studies Reviewed:      Recent Labs: 01/07/2022: ALT 22; BUN 17; Creatinine, Ser 1.01; Hemoglobin 16.1; Platelets 178; Potassium 5.0; Sodium 139  Recent Lipid Panel  Component Value Date/Time   CHOL 132 01/07/2022 0822   TRIG 122 01/07/2022 0822   HDL 50 01/07/2022 0822   CHOLHDL 2.6 01/07/2022 0822   CHOLHDL 2.6 01/24/2017 0521   VLDL 15 01/24/2017 0521   LDLCALC 60 01/07/2022 0822    Physical Exam:    VS:  BP 108/64 (BP Location: Left Arm, Patient Position: Sitting)   Pulse 72   Ht 5\' 11"  (1.803 m)   Wt 190 lb (86.2 kg)   SpO2 91%   BMI 26.50 kg/m     Wt Readings from Last 3 Encounters:  04/25/22 190 lb (86.2 kg)  04/24/22 180 lb (81.6 kg)  01/29/22 189 lb (85.7 kg)     GEN:  Well nourished, well developed in no acute distress HEENT: Normal NECK: No JVD; No carotid bruits LYMPHATICS: No lymphadenopathy CARDIAC: RRR, no murmurs, no rubs, no gallops RESPIRATORY:  Clear to auscultation without rales, wheezing or rhonchi  ABDOMEN: Soft, non-tender, non-distended MUSCULOSKELETAL:  No edema; No deformity  SKIN: Warm and dry LOWER EXTREMITIES: no swelling NEUROLOGIC:  Alert and oriented x 3 PSYCHIATRIC:  Normal affect   ASSESSMENT:    1. Coronary artery disease involving native coronary artery of native heart without angina pectoris   2. Hypertensive heart disease with chronic diastolic congestive heart failure (HCC)   3. Paroxysmal atrial fibrillation ([place])   4. Malignant neoplasm prostate ([place])   5. Malignant neoplasm of overlapping sites of bladder [facility])    PLAN:    In order of problems listed above:  Coronary disease nonobstructive disease.  On appropriate medications which I will continue asymptomatic. Essential hypertension blood pressure well-controlled continue present management. Dyslipidemia: I did review K PN which show LDL 60 HDL 50 continue present management. 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 Profound weakness and fatigue ask him to have an echocardiogram done to check left ventricle ejection fraction   Medication Adjustments/Labs and Tests Ordered: Current medicines are reviewed at length with the patient today.  Concerns regarding medicines are outlined above.  No orders of the defined types were placed in this encounter.  Medication changes: No orders of the defined types were placed in this encounter.   Signed, Park Liter, MD, [facility] 04/25/2022 11:27 AM    [place]

## 2022-04-25 NOTE — Addendum Note (Signed)
Addended by: Truddie Hidden on: 04/25/2022 11:34 AM   Modules accepted: Orders

## 2022-04-25 NOTE — Patient Instructions (Signed)
Medication Instructions:  Your physician recommends that you continue on your current medications as directed. Please refer to the Current Medication list given to you today.  *If you need a refill on your cardiac medications before your next appointment, please call your pharmacy*   Lab Work: None ordered If you have labs (blood work) drawn today and your tests are completely normal, you will receive your results only by: [place] (if you have MyChart) OR A paper copy in the mail If you have any lab test that is abnormal or we need to change your treatment, we will call you to review the results.   Testing/Procedures: Your physician has requested that you have an echocardiogram. Echocardiography is a painless test that uses sound waves to create images of your heart. It provides your doctor with information about the size and shape of your heart and how well your heart's chambers and valves are working. This procedure takes approximately one hour. There are no restrictions for this procedure. Please do NOT wear cologne, perfume, aftershave, or lotions (deodorant is allowed). Please arrive 15 minutes prior to your appointment time.     Follow-Up: At [facility], you and your health needs are our priority.  As part of our continuing mission to provide you with exceptional heart care, we have created designated Provider Care Teams.  These Care Teams include your primary Cardiologist (physician) and Advanced Practice Providers (APPs -  Physician Assistants and Nurse Practitioners) who all work together to provide you with the care you need, when you need it.  We recommend signing up for the patient portal called "MyChart".  Sign up information is provided on this After Visit Summary.  MyChart is used to connect with patients for Virtual Visits (Telemedicine).  Patients are able to view lab/test results, encounter notes, upcoming appointments, etc.  Non-urgent messages can be sent to  your provider as well.   To learn more about what you can do with MyChart, go to [email or web address].    Your next appointment:   12 month(s)  The format for your next appointment:   In Person  Provider:   Jenne Campus, MD   Other Instructions Echocardiogram An echocardiogram is a test that uses sound waves (ultrasound) to produce images of the heart. Images from an echocardiogram can provide important information about: Heart size and shape. The size and thickness and movement of your heart's walls. Heart muscle function and strength. Heart valve function or if you have stenosis. Stenosis is when the heart valves are too narrow. If blood is flowing backward through the heart valves (regurgitation). A tumor or infectious growth around the heart valves. Areas of heart muscle that are not working well because of poor blood flow or injury from a heart attack. Aneurysm detection. An aneurysm is a weak or damaged part of an artery wall. The wall bulges out from the normal force of blood pumping through the body. Tell a health care provider about: Any allergies you have. All medicines you are taking, including vitamins, herbs, eye drops, creams, and over-the-counter medicines. Any blood disorders you have. Any surgeries you have had. Any medical conditions you have. Whether you are pregnant or may be pregnant. What are the risks? Generally, this is a safe test. However, problems may occur, including an allergic reaction to dye (contrast) that may be used during the test. What happens before the test? No specific preparation is needed. You may eat and drink normally. What happens during the test? You will  take off your clothes from the waist up and put on a hospital gown. Electrodes or electrocardiogram (ECG)patches may be placed on your chest. The electrodes or patches are then connected to a device that monitors your heart rate and rhythm. You will lie down on a table for an  ultrasound exam. A gel will be applied to your chest to help sound waves pass through your skin. A handheld device, called a transducer, will be pressed against your chest and moved over your heart. The transducer produces sound waves that travel to your heart and bounce back (or "echo" back) to the transducer. These sound waves will be captured in real-time and changed into images of your heart that can be viewed on a video monitor. The images will be recorded on a computer and reviewed by your health care provider. You may be asked to change positions or hold your breath for a short time. This makes it easier to get different views or better views of your heart. In some cases, you may receive contrast through an IV in one of your veins. This can improve the quality of the pictures from your heart. The procedure may vary among health care providers and hospitals.   What can I expect after the test? You may return to your normal, everyday life, including diet, activities, and medicines, unless your health care provider tells you not to do that. Follow these instructions at home: It is up to you to get the results of your test. Ask your health care provider, or the department that is doing the test, when your results will be ready. Keep all follow-up visits. This is important. Summary An echocardiogram is a test that uses sound waves (ultrasound) to produce images of the heart. Images from an echocardiogram can provide important information about the size and shape of your heart, heart muscle function, heart valve function, and other possible heart problems. You do not need to do anything to prepare before this test. You may eat and drink normally. After the echocardiogram is completed, you may return to your normal, everyday life, unless your health care provider tells you not to do that. This information is not intended to replace advice given to you by your health care provider. Make sure you  discuss any questions you have with your health care provider. Document Revised: 09/20/2019 Document Reviewed: 09/20/2019 Elsevier Patient Education  2021 Elsevier Inc.   Important Information About Sugar        

## 2022-04-26 ENCOUNTER — Encounter: Payer: Self-pay | Admitting: Family Medicine

## 2022-04-26 LAB — COMPREHENSIVE METABOLIC PANEL
ALT: 21 IU/L (ref 0–44)
AST: 22 IU/L (ref 0–40)
Albumin/Globulin Ratio: 2 (ref 1.2–2.2)
Albumin: 4.5 g/dL (ref 3.7–4.7)
Alkaline Phosphatase: 98 IU/L (ref 44–121)
BUN/Creatinine Ratio: 13 (ref 10–24)
BUN: 13 mg/dL (ref 8–27)
Bilirubin Total: 1 mg/dL (ref 0.0–1.2)
CO2: 22 mmol/L (ref 20–29)
Calcium: 9.8 mg/dL (ref 8.6–10.2)
Chloride: 100 mmol/L (ref 96–106)
Creatinine, Ser: 1.02 mg/dL (ref 0.76–1.27)
Globulin, Total: 2.3 g/dL (ref 1.5–4.5)
Glucose: 97 mg/dL (ref 70–99)
Potassium: 4.9 mmol/L (ref 3.5–5.2)
Sodium: 139 mmol/L (ref 134–144)
Total Protein: 6.8 g/dL (ref 6.0–8.5)
eGFR: 73 mL/min/{1.73_m2} (ref >59)

## 2022-04-26 LAB — CBC WITH DIFFERENTIAL/PLATELET
Basophils Absolute: 0 10*3/uL (ref 0.0–0.2)
Basos: 0 %
EOS (ABSOLUTE): 0.1 10*3/uL (ref 0.0–0.4)
Eos: 2 %
Hematocrit: 47 % (ref 37.5–51.0)
Hemoglobin: 15.7 g/dL (ref 13.0–17.7)
Immature Grans (Abs): 0 10*3/uL (ref 0.0–0.1)
Immature Granulocytes: 0 %
Lymphocytes Absolute: 1.1 10*3/uL (ref 0.7–3.1)
Lymphs: 21 %
MCH: 29.7 pg (ref 26.6–33.0)
MCHC: 33.4 g/dL (ref 31.5–35.7)
MCV: 89 fL (ref 79–97)
Monocytes Absolute: 0.6 10*3/uL (ref 0.1–0.9)
Monocytes: 11 %
Neutrophils Absolute: 3.6 10*3/uL (ref 1.4–7.0)
Neutrophils: 66 %
Platelets: 199 10*3/uL (ref 150–450)
RBC: 5.28 x10E6/uL (ref 4.14–5.80)
RDW: 13.4 % (ref 11.6–15.4)
WBC: 5.5 10*3/uL (ref 3.4–10.8)

## 2022-04-26 LAB — LIPID PANEL
Chol/HDL Ratio: 4 ratio (ref 0.0–5.0)
Cholesterol, Total: 180 mg/dL (ref 100–199)
HDL: 45 mg/dL (ref >39)
LDL Chol Calc (NIH): 107 mg/dL — ABNORMAL HIGH (ref 0–99)
Triglycerides: 157 mg/dL — ABNORMAL HIGH (ref 0–149)
VLDL Cholesterol Cal: 28 mg/dL (ref 5–40)

## 2022-04-26 LAB — MAGNESIUM: Magnesium: 2 mg/dL (ref 1.6–2.3)

## 2022-04-26 LAB — TSH: TSH: 2.88 u[IU]/mL (ref 0.450–4.500)

## 2022-04-26 LAB — PHOSPHORUS: Phosphorus: 3 mg/dL (ref 2.8–4.1)

## 2022-04-26 LAB — CARDIOVASCULAR RISK ASSESSMENT

## 2022-04-26 LAB — HEMOGLOBIN A1C
Est. average glucose Bld gHb Est-mCnc: 126 mg/dL
Hgb A1c MFr Bld: 6 % — ABNORMAL HIGH (ref 4.8–5.6)

## 2022-04-27 NOTE — Assessment & Plan Note (Signed)
Check labs 

## 2022-04-27 NOTE — Assessment & Plan Note (Signed)
Hemoglobin A1c 6.1%, 3 month avg of blood sugars, is in prediabetic range.  In order to prevent progression to diabetes, recommend low carb diet and regular exercise  

## 2022-04-27 NOTE — Assessment & Plan Note (Signed)
Well controlled.  No changes to medicines. Atorvastatin 40 mg daily, Fish Oil 1200 mg every morning. Continue to work on eating a healthy diet and exercise.  Labs drawn today.  

## 2022-04-27 NOTE — Assessment & Plan Note (Signed)
Well controlled.  No changes to medicines. Amlodipine 5 mg daily, Aspirin 81 mg daily, Clonidine 0.1 mg daily PRN, Lisinopril 20 mg daily. Continue to work on eating a healthy diet and exercise.  Labs drawn today.  

## 2022-04-27 NOTE — Assessment & Plan Note (Signed)
Continue Omeprazole 40 mg daily 

## 2022-05-09 ENCOUNTER — Ambulatory Visit: Payer: Medicare PPO | Attending: Cardiology

## 2022-05-09 DIAGNOSIS — I480 Paroxysmal atrial fibrillation: Secondary | ICD-10-CM | POA: Diagnosis not present

## 2022-05-09 DIAGNOSIS — I2510 Atherosclerotic heart disease of native coronary artery without angina pectoris: Secondary | ICD-10-CM | POA: Diagnosis not present

## 2022-05-09 LAB — ECHOCARDIOGRAM COMPLETE
AR max vel: 2.41 cm2
AV Area VTI: 2.42 cm2
AV Area mean vel: 2.06 cm2
AV Mean grad: 6 mmHg
AV Peak grad: 9.5 mmHg
Ao pk vel: 1.54 m/s
Area-P 1/2: 2.95 cm2
S' Lateral: 2.2 cm

## 2022-05-12 DIAGNOSIS — C678 Malignant neoplasm of overlapping sites of bladder: Secondary | ICD-10-CM | POA: Diagnosis not present

## 2022-05-15 ENCOUNTER — Telehealth: Payer: Self-pay

## 2022-05-15 NOTE — Telephone Encounter (Signed)
Left message on My Chart with normal results per Dr. Krasowski's note. Routed to PCP. 

## 2022-05-16 ENCOUNTER — Telehealth: Payer: Self-pay

## 2022-05-16 NOTE — Telephone Encounter (Signed)
Pt viewed results on My Chart. Routed to PCP.  

## 2022-05-26 ENCOUNTER — Telehealth: Payer: Self-pay

## 2022-05-26 DIAGNOSIS — Z87891 Personal history of nicotine dependence: Secondary | ICD-10-CM | POA: Diagnosis not present

## 2022-05-26 DIAGNOSIS — G8929 Other chronic pain: Secondary | ICD-10-CM | POA: Diagnosis not present

## 2022-05-26 DIAGNOSIS — E871 Hypo-osmolality and hyponatremia: Secondary | ICD-10-CM | POA: Diagnosis not present

## 2022-05-26 DIAGNOSIS — R1084 Generalized abdominal pain: Secondary | ICD-10-CM | POA: Diagnosis not present

## 2022-05-26 DIAGNOSIS — R1031 Right lower quadrant pain: Secondary | ICD-10-CM | POA: Diagnosis not present

## 2022-05-26 DIAGNOSIS — R197 Diarrhea, unspecified: Secondary | ICD-10-CM | POA: Diagnosis not present

## 2022-05-26 NOTE — Telephone Encounter (Signed)
Patient called with complaints of diarrhea since last Tuesday, right side pain (rated 5) from stomach to groin, and chills.  Patient states that symptoms have gotten progressively worse over the past 6 days.  Per Dr Sedalia Muta patient should go to the ED for evaluation.  Patient agreed and is willing to go.

## 2022-05-27 DIAGNOSIS — I491 Atrial premature depolarization: Secondary | ICD-10-CM | POA: Diagnosis not present

## 2022-06-07 ENCOUNTER — Other Ambulatory Visit: Payer: Self-pay | Admitting: Family Medicine

## 2022-06-16 DIAGNOSIS — R0789 Other chest pain: Secondary | ICD-10-CM | POA: Diagnosis not present

## 2022-06-16 DIAGNOSIS — Z87891 Personal history of nicotine dependence: Secondary | ICD-10-CM | POA: Diagnosis not present

## 2022-06-16 DIAGNOSIS — I491 Atrial premature depolarization: Secondary | ICD-10-CM | POA: Diagnosis not present

## 2022-06-16 DIAGNOSIS — R110 Nausea: Secondary | ICD-10-CM | POA: Diagnosis not present

## 2022-06-16 DIAGNOSIS — R319 Hematuria, unspecified: Secondary | ICD-10-CM | POA: Diagnosis not present

## 2022-06-16 DIAGNOSIS — M25512 Pain in left shoulder: Secondary | ICD-10-CM | POA: Diagnosis not present

## 2022-06-18 ENCOUNTER — Other Ambulatory Visit: Payer: Self-pay

## 2022-06-18 DIAGNOSIS — I10 Essential (primary) hypertension: Secondary | ICD-10-CM

## 2022-06-18 MED ORDER — LISINOPRIL 20 MG PO TABS
20.0000 mg | ORAL_TABLET | Freq: Every day | ORAL | 1 refills | Status: DC
Start: 2022-06-18 — End: 2022-12-10

## 2022-06-23 DIAGNOSIS — C678 Malignant neoplasm of overlapping sites of bladder: Secondary | ICD-10-CM | POA: Diagnosis not present

## 2022-07-03 ENCOUNTER — Ambulatory Visit: Payer: Medicare PPO | Admitting: Family Medicine

## 2022-07-03 ENCOUNTER — Encounter: Payer: Self-pay | Admitting: Family Medicine

## 2022-07-03 VITALS — BP 104/54 | HR 76 | Temp 97.3°F | Resp 16 | Ht 71.0 in | Wt 193.0 lb

## 2022-07-03 DIAGNOSIS — L0211 Cutaneous abscess of neck: Secondary | ICD-10-CM

## 2022-07-03 DIAGNOSIS — D170 Benign lipomatous neoplasm of skin and subcutaneous tissue of head, face and neck: Secondary | ICD-10-CM | POA: Diagnosis not present

## 2022-07-03 NOTE — Progress Notes (Unsigned)
Acute Office Visit  Subjective:    Patient ID: [name], male    DOB: 01-05-1941, 83 y.o.   MRN: [id number]  Chief Complaint  Patient presents with   Mass    Left neck     HPI: Patient is in today for bump on his neck.  Bump is on the left posterior neck.  It has been there for some time however in the last few days it became tender.  He denies any drainage.  Denies fever.  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 06/17/2016   Deficiency of other specified B group vitamins 05/20/2019   Depression 04/04/2019   Diabetes mellitus due to underlying condition with unspecified complications (HCC) 06/28/2019   Diabetes mellitus without complication (HCC)    Type II   Diarrhea 11/21/2018   Dyslipidemia 06/17/2016   Dyspnea on exertion 06/28/2019   Dysrhythmia    Essential (primary) hypertension 06/17/2016   Essential hypertension 06/17/2016   GERD (gastroesophageal reflux disease)    on meds   Greater trochanteric pain syndrome 07/03/2020   Headache    History of hiatal hernia    History of kidney stones    History of prostate cancer 05/20/2019   Hypercholesteremia 04/04/2019   Hyperlipemia    Hypertension    Impaired fasting blood sugar 05/20/2019   Impingement syndrome of shoulder region 01/12/2018   Impingement syndrome of shoulder region 01/12/2018   Low back pain 02/20/2014   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   Lumbar radiculopathy 05/24/2019   Malignant  neoplasm prostate (HCC) 05/20/2019   Medicare annual wellness visit, subsequent 01/16/2020   Mixed hyperlipidemia 08/31/2019   Neck pain 07/03/2015   Paresthesia of hand 01/12/2018   Paresthesia of upper limb 01/12/2018   Paroxysmal atrial fibrillation (HCC) 04/04/2019   PONV (postoperative nausea and vomiting)    Pyelonephritis 06/08/2021   S/P cervical spinal fusion 03/04/2018   S/P lumbar fusion 12/09/2019   S/P lumbar spinal fusion 09/29/2013   S/P lumbar spinal fusion 09/29/2013   Sepsis (HCC) 11/22/2018   Shoulder pain 01/12/2018   Spinal 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 01/26/2018   Stenosis of intervertebral foramina 01/12/2018   Trochanteric bursitis of right hip 04/19/2020    Past Surgical History:  Procedure Laterality Date   anal fissures     ANTERIOR CERVICAL DECOMP/DISCECTOMY FUSION N/A 03/04/2018   Procedure: Anterior Cervical Decompression Fusion - Cervical three-Cervical four - Cervical four-Cervical five;  Surgeon: Tia Alert, MD;  Location: [facility] OR;  Service: Neurosurgery;  Laterality: N/A;   ANTERIOR LAT LUMBAR FUSION N/A 05/30/2020   Procedure: Anterior Lateral Lumbar Fusion Lumbar One-Two with Lateral Plate Removal of pedicle screws Lumbar One and Posterior Lateral Fusion Lumbar One-Lumbar Three;  Surgeon: Tia Alert, MD;  Location: [facility] OR;  Service: Neurosurgery;  Laterality: N/A;   BACK SURGERY     CARDIAC CATHETERIZATION     no PCI   CARPAL TUNNEL RELEASE  Left 03/04/2018   Procedure: Carpal Tunnel Release - left;  Surgeon: Tia Alert, MD;  Location: [facility] OR;  Service: Neurosurgery;  Laterality: Left;   CHOLECYSTECTOMY     COLONOSCOPY W/ POLYPECTOMY  08/30/2012   Colonic polyp status post polypectomy.  Pancolonic diverticulosis predominantly in the sigmoid colon. Small internal hemorrhoids. Moderate internal hemorhroids.    ESOPHAGOGASTRODUODENOSCOPY  08/04/2016   Schatzkis ring status post esophageal dilatation. Small hiatal hernia. Mild gastritis.    EYE SURGERY      cataract removal bilateral eyes   FOOT SURGERY     left heel   HARDWARE REMOVAL N/A 05/30/2020   Procedure: HARDWARE REMOVAL;  Surgeon: Tia Alert, MD;  Location: [facility] OR;  Service: Neurosurgery;  Laterality: N/A;   HERNIA REPAIR     LAMINECTOMY WITH POSTERIOR LATERAL ARTHRODESIS LEVEL 2 N/A 12/09/2019   Procedure: Laminectomy and Foraminotomy - Lumbar one-two, Lumbar two-three, posterolateral instrumented fusion Lumbar one-three, removal of instrumentation Lumbar three-five;  Surgeon: Tia Alert, MD;  Location: [facility] OR;  Service: Neurosurgery;  Laterality: N/A;   LEFT HEART CATH AND CORONARY ANGIOGRAPHY N/A 08/03/2019   Procedure: LEFT HEART CATH AND CORONARY ANGIOGRAPHY;  Surgeon: Corky Crafts, MD;  Location: [facility] INVASIVE CV LAB;  Service: Cardiovascular;  Laterality: N/A;   SHOULDER SURGERY     bilateral   WISDOM TOOTH EXTRACTION      Family History  Problem Relation Age of Onset   Colon cancer Neg Hx    Esophageal cancer Neg Hx    Rectal cancer Neg Hx    Stomach cancer Neg Hx    Colon polyps Neg Hx     Social History   Socioeconomic History   Marital status: Widowed    Spouse name: Not on file   Number of children: 2   Years of education: Not on file   Highest education level: Not on file  Occupational History   Not on file  Tobacco Use   Smoking status: Former    Packs/day: 0.50    Years: 35.00    Additional pack years: 0.00    Total pack years: 17.50    Types: Cigarettes    Quit date: 02/10/1993    Years since quitting: 29.4   Smokeless tobacco: Former    Types: Chew    Quit date: 02/10/2006  Vaping Use   Vaping Use: Never used  Substance and Sexual Activity   Alcohol use: Not Currently    Alcohol/week: 1.0 standard drink of alcohol    Types: 1 Cans of beer per week    Comment: occ   Drug use: No   Sexual activity: Yes    Partners: Female  Other Topics Concern   Not on file  Social History Narrative   Not on file   Social Determinants of Health    Financial Resource Strain: Low Risk  (01/17/2021)   Overall Financial Resource Strain (CARDIA)    Difficulty of Paying Living Expenses: Not hard at all  Food Insecurity: No Food Insecurity (01/17/2021)   Hunger Vital Sign    Worried About Running Out of Food in the Last Year: Never true    Ran Out of Food in the Last Year: Never true  Transportation Needs: No Transportation Needs (01/17/2021)   PRAPARE - Administrator, Civil Service (Medical): No    Lack of Transportation (Non-Medical): No  Physical Activity: Inactive (01/17/2021)   Exercise Vital Sign    Days of Exercise per Week: 0 days  Minutes of Exercise per Session: 0 min  Stress: No Stress Concern Present (01/17/2021)   [employer] of Occupational Health - Occupational Stress Questionnaire    Feeling of Stress : Not at all  Social Connections: Moderately Isolated (01/17/2021)   Social Connection and Isolation Panel [NHANES]    Frequency of Communication with Friends and Family: More than three times a week    Frequency of Social Gatherings with Friends and Family: Three times a week    Attends Religious Services: More than 4 times per year    Active Member of Clubs or Organizations: No    Attends Banker Meetings: Never    Marital Status: Widowed  Intimate Partner Violence: Not At Risk (01/17/2021)   Humiliation, Afraid, Rape, and Kick questionnaire    Fear of Current or Ex-Partner: No    Emotionally Abused: No    Physically Abused: No    Sexually Abused: No    Outpatient Medications Prior to Visit  Medication Sig Dispense Refill   aspirin EC (ASPIRIN LOW DOSE) 81 MG tablet Take 1 tablet (81 mg total) by mouth daily. Swallow whole. 90 tablet 3   atorvastatin (LIPITOR) 40 MG tablet TAKE 1 TABLET EVERY DAY  (NEW DOSE) (Patient taking differently: Take 40 mg by mouth daily.) 90 tablet 1   carboxymethylcellulose (REFRESH PLUS) 0.5 % SOLN Place 1 drop into both eyes 3 (three) times daily as needed  for dry eyes (dry/irritated eyes).     cholecalciferol (VITAMIN D) 1000 UNITS tablet Take 1,000 Units by mouth in the morning.     lisinopril (ZESTRIL) 20 MG tablet Take 1 tablet (20 mg total) by mouth daily. 90 tablet 1   metFORMIN (GLUCOPHAGE) 500 MG tablet TAKE 1 TABLET (500 MG TOTAL) BY MOUTH DAILY. 90 tablet 1   nitroGLYCERIN (NITROSTAT) 0.4 MG SL tablet Place 1 tablet (0.4 mg total) under the tongue every 5 (five) minutes x 3 doses as needed for chest pain. 25 tablet 12   Omega-3 Fatty Acids (FISH OIL) 1200 MG CAPS Take 1,200 mg by mouth in the morning.     tamsulosin (FLOMAX) 0.4 MG CAPS capsule Take 1 capsule (0.4 mg total) by mouth daily after supper. 90 capsule 1   No facility-administered medications prior to visit.    Allergies  Allergen Reactions   Oxycodone Other (See Comments)    loopy "loopy"    Review of Systems  Constitutional:  Negative for chills, diaphoresis, fatigue and fever.  HENT:  Negative for congestion, ear pain and sore throat.   Respiratory:  Negative for cough and shortness of breath.   Cardiovascular:  Negative for chest pain and leg swelling.  Gastrointestinal:  Negative for abdominal pain, constipation, diarrhea, nausea and vomiting.  Genitourinary:  Negative for dysuria and urgency.  Musculoskeletal:  Negative for arthralgias and myalgias.  Skin:        Mass left posterior neck  Neurological:  Negative for dizziness and headaches.  Psychiatric/Behavioral:  Negative for dysphoric mood.        Objective:        07/03/2022    1:52 PM 04/25/2022   11:02 AM 04/24/2022    1:56 PM  Vitals with BMI  Height 5\' 11"  5\' 11"  5\' 11"   Weight 193 lbs 190 lbs 180 lbs  BMI 26.93 26.51 25.12  Systolic 104 108 409  Diastolic 54 64 70  Pulse 76 72 87    No data found.   Physical Exam Vitals reviewed.  Constitutional:  Appearance: Normal appearance.  Neck:      Comments: 5 x 7 cm lesion. No erythema or induration, but very tender.  Very  fluctuant.  Neurological:     Mental Status: He is alert and oriented to person, place, and time.  Psychiatric:        Mood and Affect: Mood normal.        Behavior: Behavior normal.     Health Maintenance Due  Topic Date Due   OPHTHALMOLOGY EXAM  Never done   Diabetic kidney evaluation - Urine ACR  Never done   DTaP/Tdap/Td (1 - Tdap) Never done   Zoster Vaccines- Shingrix (1 of 2) Never done   COVID-19 Vaccine (5 - 2023-24 season) 03/04/2022    There are no preventive care reminders to display for this patient.   Lab Results  Component Value Date   TSH 2.880 04/25/2022   Lab Results  Component Value Date   WBC 5.5 04/25/2022   HGB 15.7 04/25/2022   HCT 47.0 04/25/2022   MCV 89 04/25/2022   PLT 199 04/25/2022   Lab Results  Component Value Date   NA 139 04/25/2022   K 4.9 04/25/2022   CO2 22 04/25/2022   GLUCOSE 97 04/25/2022   BUN 13 04/25/2022   CREATININE 1.02 04/25/2022   BILITOT 1.0 04/25/2022   ALKPHOS 98 04/25/2022   AST 22 04/25/2022   ALT 21 04/25/2022   PROT 6.8 04/25/2022   ALBUMIN 4.5 04/25/2022   CALCIUM 9.8 04/25/2022   ANIONGAP 10 05/29/2020   EGFR 73 04/25/2022   Lab Results  Component Value Date   CHOL 180 04/25/2022   Lab Results  Component Value Date   HDL 45 04/25/2022   Lab Results  Component Value Date   LDLCALC 107 (H) 04/25/2022   Lab Results  Component Value Date   TRIG 157 (H) 04/25/2022   Lab Results  Component Value Date   CHOLHDL 4.0 04/25/2022   Lab Results  Component Value Date   HGBA1C 6.0 (H) 04/25/2022       Assessment & Plan:  Abscess of neck Assessment & Plan: Referring to General surgery  Orders: -     Ambulatory referral to General Surgery     No orders of the defined types were placed in this encounter.   Orders Placed This Encounter  Procedures   Ambulatory referral to General Surgery     Follow-up: No follow-ups on file.  An After Visit Summary was printed and given to the  patient.   Clayborn Bigness I Leal-Borjas,acting as a scribe for Blane Ohara, MD.,have documented all relevant documentation on the behalf of Blane Ohara, MD,as directed by  Blane Ohara, MD while in the presence of Blane Ohara, MD.   Blane Ohara, MD [facility] [phone]

## 2022-07-04 DIAGNOSIS — E119 Type 2 diabetes mellitus without complications: Secondary | ICD-10-CM | POA: Diagnosis not present

## 2022-07-04 DIAGNOSIS — K219 Gastro-esophageal reflux disease without esophagitis: Secondary | ICD-10-CM | POA: Diagnosis not present

## 2022-07-04 DIAGNOSIS — I4891 Unspecified atrial fibrillation: Secondary | ICD-10-CM | POA: Diagnosis not present

## 2022-07-04 DIAGNOSIS — I10 Essential (primary) hypertension: Secondary | ICD-10-CM | POA: Diagnosis not present

## 2022-07-04 DIAGNOSIS — I252 Old myocardial infarction: Secondary | ICD-10-CM | POA: Diagnosis not present

## 2022-07-04 DIAGNOSIS — Z7984 Long term (current) use of oral hypoglycemic drugs: Secondary | ICD-10-CM | POA: Diagnosis not present

## 2022-07-04 DIAGNOSIS — Z7982 Long term (current) use of aspirin: Secondary | ICD-10-CM | POA: Diagnosis not present

## 2022-07-04 DIAGNOSIS — E785 Hyperlipidemia, unspecified: Secondary | ICD-10-CM | POA: Diagnosis not present

## 2022-07-04 DIAGNOSIS — D170 Benign lipomatous neoplasm of skin and subcutaneous tissue of head, face and neck: Secondary | ICD-10-CM | POA: Diagnosis not present

## 2022-07-06 NOTE — Assessment & Plan Note (Signed)
Referring to General surgery. 

## 2022-07-16 DIAGNOSIS — Z09 Encounter for follow-up examination after completed treatment for conditions other than malignant neoplasm: Secondary | ICD-10-CM | POA: Insufficient documentation

## 2022-07-17 DIAGNOSIS — N3941 Urge incontinence: Secondary | ICD-10-CM | POA: Diagnosis not present

## 2022-07-17 DIAGNOSIS — C679 Malignant neoplasm of bladder, unspecified: Secondary | ICD-10-CM | POA: Diagnosis not present

## 2022-07-23 NOTE — Progress Notes (Signed)
NEUROLOGY FOLLOW UP OFFICE NOTE  [name] [id number]  Assessment/Plan:   Right-sided trigeminal neuralgia.  I think findings on MRI are incidental and asymptomatic.  These headaches are not consistent with intracranial hypotension.  Restart carbamazepine 100mg  twice daily.  If no improvement in 2 weeks, increase dose. Check MRI of right trigeminal nerve with and without contrast Follow up in 4 months  Subjective:  [name] is an 82 year old male with a fib, CAD, DM II, HTN, HLD who follows up for headache.  He is accompanied by his wife and daughter.   UPDATE: Last seen a year ago.  I had referred him to [facility] for consideration of CT myelogram and evaluation for possible CSF leak.  However he was diagnosed with bladder cancer.  Never received a call from [facility].  When I last saw him in June of last year, headaches became manageable.    Headaches were occasional but have been worse in the last 3 weeks.  Same severe paroxysmal stabbing pain from right eye radiating back along the temple to back of head and down the right side of his jaw and into the throat.  Has persistent pressure around the eye and cheek.  Aggravated with eating or turning his head.  Around eye radiating to back, ear and down and into throat and when turn head - stabbing   CBC and CMP from 06/16/2022 unremarkable   HISTORY: In early September 2022, he developed a new headache described as a severe ice pick pain in his right temple.  It woke him up out of sleep.  It would last 10 seconds at a time and occur throughout the day.  The next day, he leaned over and clear fluid ran out of both nostrils.  He reported no upper respiratory symptoms.  He was seen in the ED at [facility] on 9/7 where he was diagnosed with trigeminal neuralgia and discharged on carbamazepine.  He then developed a persistent pressure pain behind and around his right eye.  Laying down lessens the pain.  It is not aggravated  by valsalva/coughing.  Bending over or turning neck to the right aggravates the pain.  Sometimes nausea.  No visual disturbance, autonomic symptoms, photophobia or phonophobia.  He also has become unsteady on his feet.  MRI of brain with and without contrast on 10/19/2020 showed chronic small vessel ischemic changes in the cerebral white matter as well as mild prominence and hyperenhancement of the dura with enlargement and rounding of the left-greater-than right transverse sinus raising possibility of spontaneous intracranial hypotension.  He followed up with Dr. Marikay Alar of neurosurgery.  He underwent CT cisternogram on 10/24/2020 which revealed opening pressure of 13 cm H2O and no evidence of CSF leak.  CT of sinuses revealed no evidence of bone defect/fracture or fluid opacification. Sed rate and CRP on 11/21/2020 were 14 and < 1.0 respectively.  MRI of brain with and without contrast on 12/08/2020 personally reviewed, showed unchanged dural enhancement no new or progressive abnormalities.  MRA of head was normal.  He saw Dr. Rosine Door of [facility] on 12/25/2020.  Plan is to collect drainage for Beta-2 Transferrin testing.  However, he has not produced any fluid to collect.  However, there is doubt that it is CSF.   Patient has history of lumbar spine discectomy and fusion from L through L5.  CT lumbar myelogram on 04/25/2020 showed screw loosening bilaterally at L1  with pronounced lucency around the hardware.     Past medication: carbamazepine, Depakote, gabapentin (side effects), nortriptyline  PAST MEDICAL HISTORY: Past Medical History:  Diagnosis Date   Angina pectoris (HCC) 08/02/2019   Arthritis    on meds   Atrial fibrillation (HCC)    Backache 08/17/2012   Body mass index (BMI) 26.0-26.9, adult 05/08/2020   Cancer (HCC) 10/2015   prostate cancer treated with radiation   Carpal tunnel syndrome 01/18/2018   Cataract    sx-bilaterally    Cervical spondylosis without myelopathy 09/24/2015   Chest tightness 06/28/2019   Chronic back pain 05/30/2013   Chronic low back pain 02/20/2014   Colitis 11/22/2018   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 06/17/2016   Deficiency of other specified B group vitamins 05/20/2019   Depression 04/04/2019   Diabetes mellitus due to underlying condition with unspecified complications (HCC) 06/28/2019   Diabetes mellitus without complication (HCC)    Type II   Diarrhea 11/21/2018   Dyslipidemia 06/17/2016   Dyspnea on exertion 06/28/2019   Dysrhythmia    Essential (primary) hypertension 06/17/2016   Essential hypertension 06/17/2016   GERD (gastroesophageal reflux disease)    on meds   Greater trochanteric pain syndrome 07/03/2020   Headache    History of hiatal hernia    History of kidney stones    History of prostate cancer 05/20/2019   Hypercholesteremia 04/04/2019   Hyperlipemia    Hypertension    Impaired fasting blood sugar 05/20/2019   Impingement syndrome of shoulder region 01/12/2018   Impingement syndrome of shoulder region 01/12/2018   Low back pain 02/20/2014   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   Lumbar radiculopathy 05/24/2019   Malignant neoplasm prostate (HCC) 05/20/2019   Medicare annual wellness visit, subsequent 01/16/2020   Mixed hyperlipidemia 08/31/2019   Neck pain 07/03/2015   Paresthesia of hand 01/12/2018   Paresthesia of upper limb 01/12/2018   Paroxysmal atrial fibrillation (HCC) 04/04/2019   PONV (postoperative nausea and vomiting)    S/P cervical spinal fusion 03/04/2018   S/P lumbar fusion 12/09/2019   S/P lumbar spinal fusion 09/29/2013   S/P lumbar spinal fusion 09/29/2013   Sepsis (HCC) 11/22/2018   Shoulder pain 01/12/2018   Spinal 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 01/26/2018   Stenosis of intervertebral foramina 01/12/2018   Trochanteric bursitis of right hip 04/19/2020    MEDICATIONS: Current Outpatient Medications on File Prior to Visit   Medication Sig Dispense Refill   amLODipine (NORVASC) 5 MG tablet Take 5 mg by mouth daily.     ASPIRIN LOW DOSE 81 MG EC tablet TAKE 1 TABLET BY MOUTH EVERY DAY (Patient taking differently: Take 81 mg by mouth daily.) 90 tablet 3   atorvastatin (LIPITOR) 40 MG tablet TAKE 1 TABLET EVERY DAY  (NEW DOSE) (Patient taking differently: Take 40 mg by mouth daily.) 90 tablet 1   carboxymethylcellulose (REFRESH PLUS) 0.5 % SOLN Place 1 drop into both eyes 3 (three) times daily as needed for dry eyes (dry/irritated eyes).     cholecalciferol (VITAMIN D) 1000 UNITS tablet Take 1,000 Units by mouth in the morning.     cloNIDine (CATAPRES) 0.1 MG tablet Take 0.1 mg by mouth as needed (Take when sbp 170).     lisinopril (ZESTRIL) 20 MG tablet Take 1 tablet (20 mg total) by mouth daily. 90 tablet 1   metFORMIN (GLUCOPHAGE) 500 MG tablet Take 500 mg by mouth daily.     nitroGLYCERIN (NITROSTAT) 0.4 MG SL tablet Place  1 tablet (0.4 mg total) under the tongue every 5 (five) minutes x 3 doses as needed for chest pain. 25 tablet 12   Omega-3 Fatty Acids (FISH OIL) 1200 MG CAPS Take 1,200 mg by mouth in the morning.     pantoprazole (PROTONIX) 40 MG tablet Take 1 tablet (40 mg total) by mouth daily. 90 tablet 1   tamsulosin (FLOMAX) 0.4 MG CAPS capsule Take 0.4 mg by mouth daily after supper.  1   No current facility-administered medications on file prior to visit.    ALLERGIES: Allergies  Allergen Reactions   Oxycodone Other (See Comments)    loopy "loopy"    FAMILY HISTORY: Family History  Problem Relation Age of Onset   Colon cancer Neg Hx    Esophageal cancer Neg Hx    Rectal cancer Neg Hx    Stomach cancer Neg Hx    Colon polyps Neg Hx       Objective:  Blood pressure (!) 148/78, pulse 68, height 5\' 9"  (1.753 m), weight 191 lb (86.6 kg), SpO2 96 %. General: No acute distress.  Patient appears well-groomed.   Head:  Normocephalic/atraumatic Eyes:  Fundi examined but not visualized Neck:  supple, no paraspinal tenderness, full range of motion Heart:  Regular rate and rhythm Neurological Exam: alert and oriented.  Speech fluent and not dysarthric, language intact.  CN II-XII intact. Bulk and tone normal, muscle strength 5/5 throughout.  Sensation to light touch intact.  Deep tendon reflexes 2+ throughout.  Finger to nose testing intact.  Gait normal, Romberg negative.   Shon Millet, DO  CC: Blane Ohara, MD

## 2022-07-24 ENCOUNTER — Telehealth: Payer: Self-pay | Admitting: Neurology

## 2022-07-24 ENCOUNTER — Ambulatory Visit: Payer: Medicare PPO | Admitting: Neurology

## 2022-07-24 VITALS — BP 148/78 | HR 68 | Ht 69.0 in | Wt 191.0 lb

## 2022-07-24 DIAGNOSIS — G500 Trigeminal neuralgia: Secondary | ICD-10-CM

## 2022-07-24 MED ORDER — DIAZEPAM 5 MG PO TABS: ORAL_TABLET | ORAL | 0 refills | Status: DC

## 2022-07-24 MED ORDER — CARBAMAZEPINE ER 100 MG PO CP12: 100.0000 mg | ORAL_CAPSULE | Freq: Two times a day (BID) | ORAL | 5 refills | Status: DC

## 2022-07-24 MED ORDER — CARBAMAZEPINE ER 100 MG PO TB12: 100.0000 mg | ORAL_TABLET | Freq: Two times a day (BID) | ORAL | 5 refills | Status: DC

## 2022-07-24 NOTE — Telephone Encounter (Signed)
New message    Daughter is asking for a call back regarding medication that was called in.

## 2022-07-24 NOTE — Telephone Encounter (Signed)
Patient states call his daughter. Will call in the morning.

## 2022-07-24 NOTE — Patient Instructions (Addendum)
Start carbamazepine 100mg  twice daily.  If no improvement in 2 weeks, contact me Check MRI of right trigeminal nerve with and without contarst

## 2022-07-25 ENCOUNTER — Encounter: Payer: Self-pay | Admitting: Neurology

## 2022-07-28 NOTE — Telephone Encounter (Signed)
LMOVM for daughter to call the office.

## 2022-08-01 ENCOUNTER — Encounter: Payer: Self-pay | Admitting: Neurology

## 2022-08-04 DIAGNOSIS — C679 Malignant neoplasm of bladder, unspecified: Secondary | ICD-10-CM | POA: Diagnosis not present

## 2022-08-06 ENCOUNTER — Ambulatory Visit
Admission: RE | Admit: 2022-08-06 | Discharge: 2022-08-06 | Disposition: A | Discharge status: Home / Self Care | Payer: Medicare PPO | Source: Ambulatory Visit | Attending: Neurology | Admitting: Neurology

## 2022-08-06 DIAGNOSIS — G500 Trigeminal neuralgia: Secondary | ICD-10-CM | POA: Diagnosis not present

## 2022-08-06 MED ORDER — GADOPICLENOL 0.5 MMOL/ML IV SOLN
10.0000 mL | Freq: Once | INTRAVENOUS | Status: AC | PRN
  Administered 2022-08-06: 10 mL via INTRAVENOUS

## 2022-08-11 ENCOUNTER — Ambulatory Visit: Payer: Medicare PPO | Admitting: Family Medicine

## 2022-08-19 NOTE — Progress Notes (Signed)
Patient advised.

## 2022-08-20 ENCOUNTER — Ambulatory Visit: Payer: Medicare PPO | Admitting: Family Medicine

## 2022-08-20 VITALS — BP 110/62 | HR 78 | Temp 97.2°F | Resp 18 | Ht 69.0 in | Wt 192.0 lb

## 2022-08-20 DIAGNOSIS — R7301 Impaired fasting glucose: Secondary | ICD-10-CM | POA: Diagnosis not present

## 2022-08-20 DIAGNOSIS — I110 Hypertensive heart disease with heart failure: Secondary | ICD-10-CM | POA: Diagnosis not present

## 2022-08-20 DIAGNOSIS — I5032 Chronic diastolic (congestive) heart failure: Secondary | ICD-10-CM

## 2022-08-20 DIAGNOSIS — E782 Mixed hyperlipidemia: Secondary | ICD-10-CM

## 2022-08-20 NOTE — Progress Notes (Signed)
Subjective:  Patient ID: [name], male    DOB: 30-Aug-1940  Age: 82 y.o. MRN: [id number]  Chief Complaint  Patient presents with   Medical Management of Chronic Issues    HPI Diabetes: Taking Metformin 500 mg daily. Last a1c 6.0.  Okay overdue for eye exam.    GERD: Taking Omeprazole 40 mg daily.    Hyperlipidemia: Current medications: Atorvastatin 40 mg daily, Fish Oil 1200 mg every morning.   Hypertension: Current medications: Aspirin 81 mg daily, Lisinopril 20 mg daily.    Bladder cancer: Dr. Bryson Ha is oncology. Dr Romana Juniper is urologist. Patient underwent cystoscopy and sampling of urine in June.  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  Patient is receiving chemotherapy intra bladder.     08/20/2022    7:27 AM 07/03/2022    1:54 PM 04/24/2022    1:59 PM 01/07/2022   10:34 AM 01/07/2022    7:56 AM  Depression screen PHQ 2/9  Decreased Interest 0 0 0 0 0  Down, Depressed, Hopeless 0 0 0 0 0  PHQ - 2 Score 0 0 0 0 0        08/20/2022    7:27 AM  Fall Risk   Falls in the past year? 1  Number falls in past yr: 0  Injury with Fall? 0  Risk for fall due to : History of fall(s)  Follow up Falls evaluation completed;Falls prevention discussed    Patient Care Team: Blane Ohara, MD as PCP - General (Family Medicine) Revankar, Aundra Dubin, MD as Consulting Physician (Cardiology) Dawley, Alan Mulder, DO as Consulting Physician Arman Bogus, MD as Consulting Physician (Neurosurgery)   Review of Systems  Constitutional:  Positive for fatigue. Negative for chills and fever.  HENT:  Negative for congestion, rhinorrhea and sore throat.   Respiratory:  Negative for cough and shortness of breath.   Cardiovascular:  Negative for chest pain and palpitations.  Gastrointestinal:  Positive for abdominal pain. Negative for constipation, diarrhea, nausea and vomiting.  Genitourinary:  Negative for dysuria and urgency.   Musculoskeletal:  Positive for back pain. Negative for arthralgias and myalgias.  Neurological:  Positive for dizziness and headaches.  Psychiatric/Behavioral:  Negative for dysphoric mood. The patient is not nervous/anxious.     Current Outpatient Medications on File Prior to Visit  Medication Sig Dispense Refill   aspirin EC (ASPIRIN LOW DOSE) 81 MG tablet Take 1 tablet (81 mg total) by mouth daily. Swallow whole. 90 tablet 3   carbamazepine (TEGRETOL XR) 100 MG 12 hr tablet Take 1 tablet (100 mg total) by mouth 2 (two) times daily. 60 tablet 5   carboxymethylcellulose (REFRESH PLUS) 0.5 % SOLN Place 1 drop into both eyes 3 (three) times daily as needed for dry eyes (dry/irritated eyes).     cholecalciferol (VITAMIN D) 1000 UNITS tablet Take 1,000 Units by mouth in the morning.     lisinopril (ZESTRIL) 20 MG tablet Take 1 tablet (20 mg total) by mouth daily. 90 tablet 1   metFORMIN (GLUCOPHAGE) 500 MG tablet TAKE 1 TABLET (500 MG TOTAL) BY MOUTH DAILY. 90 tablet 1   nitroGLYCERIN (NITROSTAT) 0.4 MG SL tablet Place 1 tablet (0.4 mg total) under the tongue every 5 (five) minutes x 3 doses as needed for chest pain. 25 tablet 12   tamsulosin (FLOMAX) 0.4 MG CAPS capsule Take 1 capsule (0.4 mg total) by mouth daily after supper. 90 capsule 1   No  current facility-administered medications on file prior to visit.   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 06/17/2016    Deficiency of other specified B group vitamins 05/20/2019   Depression 04/04/2019   Diabetes mellitus due to underlying condition with unspecified complications (HCC) 06/28/2019   Diabetes mellitus without complication (HCC)    Type II   Diarrhea 11/21/2018   Dyslipidemia 06/17/2016   Dyspnea on exertion 06/28/2019   Dysrhythmia    Essential (primary) hypertension 06/17/2016   Essential hypertension 06/17/2016   GERD (gastroesophageal reflux disease)    on meds   Greater trochanteric pain syndrome 07/03/2020   Headache    History of hiatal hernia    History of kidney stones    History of prostate cancer 05/20/2019   Hypercholesteremia 04/04/2019   Hyperlipemia    Hypertension    Impaired fasting blood sugar 05/20/2019   Impingement syndrome of shoulder region 01/12/2018   Impingement syndrome of shoulder region 01/12/2018   Low back pain 02/20/2014   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   Lumbar radiculopathy 05/24/2019   Malignant neoplasm prostate (HCC) 05/20/2019   Medicare annual wellness visit, subsequent 01/16/2020   Mixed hyperlipidemia 08/31/2019   Neck pain 07/03/2015   Paresthesia of hand 01/12/2018   Paresthesia of upper limb 01/12/2018   Paroxysmal atrial fibrillation (HCC) 04/04/2019   PONV (postoperative nausea and vomiting)    Pyelonephritis 06/08/2021   S/P cervical spinal fusion 03/04/2018   S/P lumbar fusion 12/09/2019   S/P lumbar spinal fusion 09/29/2013   S/P lumbar spinal fusion 09/29/2013   Sepsis (HCC) 11/22/2018   Shoulder pain 01/12/2018   Spinal 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 01/26/2018   Stenosis of intervertebral foramina 01/12/2018   Trochanteric bursitis of right hip 04/19/2020   Past Surgical History:  Procedure Laterality Date   anal fissures     ANTERIOR CERVICAL DECOMP/DISCECTOMY FUSION N/A 03/04/2018   Procedure: Anterior Cervical Decompression Fusion - Cervical three-Cervical four - Cervical four-Cervical five;  Surgeon: Tia Alert, MD;  Location[facility] OR;  Service:  Neurosurgery;  Laterality: N/A;   ANTERIOR LAT LUMBAR FUSION N/A 05/30/2020   Procedure: Anterior Lateral Lumbar Fusion Lumbar One-Two with Lateral Plate Removal of pedicle screws Lumbar One and Posterior Lateral Fusion Lumbar One-Lumbar Three;  Surgeon: Tia Alert, MD;  Location: [facility] OR;  Service: Neurosurgery;  Laterality: N/A;   BACK SURGERY     CARDIAC CATHETERIZATION     no PCI   CARPAL TUNNEL RELEASE Left 03/04/2018   Procedure: Carpal Tunnel Release - left;  Surgeon: Tia Alert, MD;  Location: [facility] OR;  Service: Neurosurgery;  Laterality: Left;   CHOLECYSTECTOMY     COLONOSCOPY W/ POLYPECTOMY  08/30/2012   Colonic polyp status post polypectomy.  Pancolonic diverticulosis predominantly in the sigmoid colon. Small internal hemorrhoids. Moderate internal hemorhroids.    ESOPHAGOGASTRODUODENOSCOPY  08/04/2016   Schatzkis ring status post esophageal dilatation. Small hiatal hernia. Mild gastritis.  EYE SURGERY     cataract removal bilateral eyes   FOOT SURGERY     left heel   HARDWARE REMOVAL N/A 05/30/2020   Procedure: HARDWARE REMOVAL;  Surgeon: Tia Alert, MD;  Location: [facility] OR;  Service: Neurosurgery;  Laterality: N/A;   HERNIA REPAIR     LAMINECTOMY WITH POSTERIOR LATERAL ARTHRODESIS LEVEL 2 N/A 12/09/2019   Procedure: Laminectomy and Foraminotomy - Lumbar one-two, Lumbar two-three, posterolateral instrumented fusion Lumbar one-three, removal of instrumentation Lumbar three-five;  Surgeon: Tia Alert, MD;  Location: [facility] OR;  Service: Neurosurgery;  Laterality: N/A;   LEFT HEART CATH AND CORONARY ANGIOGRAPHY N/A 08/03/2019   Procedure: LEFT HEART CATH AND CORONARY ANGIOGRAPHY;  Surgeon: Corky Crafts, MD;  Location: [facility] INVASIVE CV LAB;  Service: Cardiovascular;  Laterality: N/A;   SHOULDER SURGERY     bilateral   WISDOM TOOTH EXTRACTION      Family History  Problem Relation Age of Onset   Colon cancer Neg Hx    Esophageal cancer Neg Hx    Rectal cancer Neg Hx     Stomach cancer Neg Hx    Colon polyps Neg Hx    Social History   Socioeconomic History   Marital status: Widowed    Spouse name: Not on file   Number of children: 2   Years of education: Not on file   Highest education level: 11th grade  Occupational History   Not on file  Tobacco Use   Smoking status: Former    Current packs/day: 0.00    Average packs/day: 0.5 packs/day for 35.0 years (17.5 ttl pk-yrs)    Types: Cigarettes    Start date: 02/10/1958    Quit date: 02/10/1993    Years since quitting: 29.5   Smokeless tobacco: Former    Types: Chew    Quit date: 02/10/2006  Vaping Use   Vaping status: Never Used  Substance and Sexual Activity   Alcohol use: Not Currently    Alcohol/week: 1.0 standard drink of alcohol    Types: 1 Cans of beer per week    Comment: occ   Drug use: No   Sexual activity: Yes    Partners: Female  Other Topics Concern   Not on file  Social History Narrative   Not on file   Social Determinants of Health   Financial Resource Strain: Low Risk  (08/16/2022)   Overall Financial Resource Strain (CARDIA)    Difficulty of Paying Living Expenses: Not hard at all  Food Insecurity: No Food Insecurity (08/16/2022)   Hunger Vital Sign    Worried About Running Out of Food in the Last Year: Never true    Ran Out of Food in the Last Year: Never true  Transportation Needs: No Transportation Needs (08/16/2022)   PRAPARE - Administrator, Civil Service (Medical): No    Lack of Transportation (Non-Medical): No  Physical Activity: Unknown (08/16/2022)   Exercise Vital Sign    Days of Exercise per Week: 0 days    Minutes of Exercise per Session: Not on file  Stress: No Stress Concern Present (08/16/2022)   [employer] of Occupational Health - Occupational Stress Questionnaire    Feeling of Stress : Not at all  Social Connections: Moderately Isolated (08/16/2022)   Social Connection and Isolation Panel [NHANES]    Frequency of Communication with Friends  and Family: More than three times a week    Frequency of Social Gatherings with Friends and Family: Three times  a week    Attends Religious Services: More than 4 times per year    Active Member of Clubs or Organizations: No    Attends Banker Meetings: Not on file    Marital Status: Widowed    Objective:  BP 110/62   Pulse 78   Temp (!) 97.2 F (36.2 C)   Resp 18   Ht 5\' 9"  (1.753 m)   Wt 192 lb (87.1 kg)   BMI 28.35 kg/m      08/20/2022    7:26 AM 07/24/2022   11:46 AM 07/03/2022    1:52 PM  BP/Weight  Systolic BP 110 148 104  Diastolic BP 62 78 54  Wt. (Lbs) 192 191 193  BMI 28.35 kg/m2 28.21 kg/m2 26.92 kg/m2    Physical Exam Vitals reviewed.  Constitutional:      Appearance: Normal appearance.  Neck:     Vascular: No carotid bruit.  Cardiovascular:     Rate and Rhythm: Normal rate and regular rhythm.     Heart sounds: Normal heart sounds.  Pulmonary:     Effort: Pulmonary effort is normal.     Breath sounds: Normal breath sounds. No wheezing, rhonchi or rales.  Abdominal:     General: Bowel sounds are normal.     Palpations: Abdomen is soft.     Tenderness: There is no abdominal tenderness.  Neurological:     Mental Status: He is alert.  Psychiatric:        Mood and Affect: Mood normal.        Behavior: Behavior normal.     Diabetic Foot Exam - Simple   No data filed      Lab Results  Component Value Date   WBC 5.1 08/20/2022   HGB 15.8 08/20/2022   HCT 48.0 08/20/2022   PLT 206 08/20/2022   GLUCOSE 100 (H) 08/20/2022   CHOL 168 08/20/2022   TRIG 178 (H) 08/20/2022   HDL 42 08/20/2022   LDLCALC 95 08/20/2022   ALT 17 08/20/2022   AST 18 08/20/2022   NA 139 08/20/2022   K 5.2 08/20/2022   CL 100 08/20/2022   CREATININE 1.06 08/20/2022   BUN 17 08/20/2022   CO2 22 08/20/2022   TSH 2.880 04/25/2022   INR 0.9 05/29/2020   HGBA1C 6.1 (H) 08/20/2022   MICROALBUR 30 07/11/2020      Assessment & Plan:    Hypertensive heart  disease with chronic diastolic congestive heart failure (HCC) Assessment & Plan: Well controlled.  No changes to medicines. Aspirin 81 mg daily, Lisinopril 20 mg daily. Continue to work on eating a healthy diet and exercise.  Labs drawn today.   Orders: -     CBC with Differential/Platelet -     Comprehensive metabolic panel  Impaired fasting blood sugar Assessment & Plan: Hemoglobin A1c 6.0, 3 month avg of blood sugars, is in prediabetic range.  In order to prevent progression to diabetes, recommend low carb diet and regular exercise. Due for eye examl    Orders: -     Hemoglobin A1c  Mixed hyperlipidemia Assessment & Plan: Well controlled.  No changes to medicines. Atorvastatin 40 mg daily, Fish Oil 1200 mg every morning. Continue to work on eating a healthy diet and exercise.  Labs drawn today.   Orders: -     Lipid panel     No orders of the defined types were placed in this encounter.   Orders Placed This Encounter  Procedures  CBC with Differential/Platelet   Comprehensive metabolic panel   Hemoglobin A1c   Lipid panel     Follow-up: Return in about 3 months (around 11/20/2022) for chronic follow up.   I,Marla I Leal-Borjas,acting as a scribe for Blane Ohara, MD.,have documented all relevant documentation on the behalf of Blane Ohara, MD,as directed by  Blane Ohara, MD while in the presence of Blane Ohara, MD.   An After Visit Summary was printed and given to the patient.  Blane Ohara, MD [facility] [phone]

## 2022-08-20 NOTE — Assessment & Plan Note (Addendum)
Hemoglobin A1c 6.0, 3 month avg of blood sugars, is in prediabetic range.  In order to prevent progression to diabetes, recommend low carb diet and regular exercise. Due for eye examl

## 2022-08-20 NOTE — Assessment & Plan Note (Signed)
Well controlled.  No changes to medicines. Atorvastatin 40 mg daily, Fish Oil 1200 mg every morning. Continue to work on eating a healthy diet and exercise.  Labs drawn today.  

## 2022-08-20 NOTE — Assessment & Plan Note (Signed)
Well controlled.  No changes to medicines. Aspirin 81 mg daily, Lisinopril 20 mg daily. Continue to work on eating a healthy diet and exercise.  Labs drawn today.

## 2022-08-21 LAB — CBC WITH DIFFERENTIAL/PLATELET
Basophils Absolute: 0 10*3/uL (ref 0.0–0.2)
Basos: 0 %
EOS (ABSOLUTE): 0.1 10*3/uL (ref 0.0–0.4)
Eos: 2 %
Hematocrit: 48 % (ref 37.5–51.0)
Hemoglobin: 15.8 g/dL (ref 13.0–17.7)
Immature Grans (Abs): 0 10*3/uL (ref 0.0–0.1)
Immature Granulocytes: 0 %
Lymphocytes Absolute: 1.1 10*3/uL (ref 0.7–3.1)
Lymphs: 22 %
MCH: 29.4 pg (ref 26.6–33.0)
MCHC: 32.9 g/dL (ref 31.5–35.7)
MCV: 89 fL (ref 79–97)
Monocytes Absolute: 0.6 10*3/uL (ref 0.1–0.9)
Monocytes: 11 %
Neutrophils Absolute: 3.3 10*3/uL (ref 1.4–7.0)
Neutrophils: 65 %
Platelets: 206 10*3/uL (ref 150–450)
RBC: 5.38 x10E6/uL (ref 4.14–5.80)
RDW: 12.8 % (ref 11.6–15.4)
WBC: 5.1 10*3/uL (ref 3.4–10.8)

## 2022-08-21 LAB — HEMOGLOBIN A1C
Est. average glucose Bld gHb Est-mCnc: 128 mg/dL
Hgb A1c MFr Bld: 6.1 % — ABNORMAL HIGH (ref 4.8–5.6)

## 2022-08-21 LAB — COMPREHENSIVE METABOLIC PANEL
ALT: 17 IU/L (ref 0–44)
AST: 18 IU/L (ref 0–40)
Albumin: 4.6 g/dL (ref 3.7–4.7)
Alkaline Phosphatase: 117 IU/L (ref 44–121)
BUN/Creatinine Ratio: 16 (ref 10–24)
BUN: 17 mg/dL (ref 8–27)
Bilirubin Total: 0.4 mg/dL (ref 0.0–1.2)
CO2: 22 mmol/L (ref 20–29)
Calcium: 9.7 mg/dL (ref 8.6–10.2)
Chloride: 100 mmol/L (ref 96–106)
Creatinine, Ser: 1.06 mg/dL (ref 0.76–1.27)
Globulin, Total: 2.5 g/dL (ref 1.5–4.5)
Glucose: 100 mg/dL — ABNORMAL HIGH (ref 70–99)
Potassium: 5.2 mmol/L (ref 3.5–5.2)
Sodium: 139 mmol/L (ref 134–144)
Total Protein: 7.1 g/dL (ref 6.0–8.5)
eGFR: 69 mL/min/{1.73_m2} (ref >59)

## 2022-08-21 LAB — LIPID PANEL
Chol/HDL Ratio: 4 ratio (ref 0.0–5.0)
Cholesterol, Total: 168 mg/dL (ref 100–199)
HDL: 42 mg/dL (ref >39)
LDL Chol Calc (NIH): 95 mg/dL (ref 0–99)
Triglycerides: 178 mg/dL — ABNORMAL HIGH (ref 0–149)
VLDL Cholesterol Cal: 31 mg/dL (ref 5–40)

## 2022-08-22 ENCOUNTER — Other Ambulatory Visit: Payer: Self-pay

## 2022-08-22 MED ORDER — ICOSAPENT ETHYL 1 G PO CAPS: 2.0000 g | ORAL_CAPSULE | Freq: Two times a day (BID) | ORAL | 0 refills | Status: DC

## 2022-08-22 MED ORDER — ATORVASTATIN CALCIUM 40 MG PO TABS: 40.0000 mg | ORAL_TABLET | Freq: Every day | ORAL | 0 refills | Status: DC

## 2022-08-23 ENCOUNTER — Encounter: Payer: Self-pay | Admitting: Family Medicine

## 2022-09-03 DIAGNOSIS — D4112 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: Secondary | ICD-10-CM | POA: Diagnosis not present

## 2022-09-03 DIAGNOSIS — C679 Malignant neoplasm of bladder, unspecified: Secondary | ICD-10-CM | POA: Diagnosis not present

## 2022-09-14 ENCOUNTER — Other Ambulatory Visit: Payer: Self-pay | Admitting: Family Medicine

## 2022-09-14 DIAGNOSIS — K219 Gastro-esophageal reflux disease without esophagitis: Secondary | ICD-10-CM

## 2022-09-18 ENCOUNTER — Other Ambulatory Visit: Payer: Self-pay | Admitting: Family Medicine

## 2022-09-23 DIAGNOSIS — C642 Malignant neoplasm of left kidney, except renal pelvis: Secondary | ICD-10-CM | POA: Diagnosis not present

## 2022-09-23 DIAGNOSIS — Z7984 Long term (current) use of oral hypoglycemic drugs: Secondary | ICD-10-CM | POA: Diagnosis not present

## 2022-09-23 DIAGNOSIS — N2889 Other specified disorders of kidney and ureter: Secondary | ICD-10-CM | POA: Diagnosis not present

## 2022-09-23 DIAGNOSIS — E785 Hyperlipidemia, unspecified: Secondary | ICD-10-CM | POA: Diagnosis not present

## 2022-09-23 DIAGNOSIS — I5032 Chronic diastolic (congestive) heart failure: Secondary | ICD-10-CM | POA: Diagnosis not present

## 2022-09-23 DIAGNOSIS — C679 Malignant neoplasm of bladder, unspecified: Secondary | ICD-10-CM | POA: Diagnosis not present

## 2022-09-23 DIAGNOSIS — C652 Malignant neoplasm of left renal pelvis: Secondary | ICD-10-CM | POA: Diagnosis not present

## 2022-09-23 DIAGNOSIS — I110 Hypertensive heart disease with heart failure: Secondary | ICD-10-CM | POA: Diagnosis not present

## 2022-09-23 DIAGNOSIS — I2510 Atherosclerotic heart disease of native coronary artery without angina pectoris: Secondary | ICD-10-CM | POA: Diagnosis not present

## 2022-09-23 DIAGNOSIS — I480 Paroxysmal atrial fibrillation: Secondary | ICD-10-CM | POA: Diagnosis not present

## 2022-09-23 DIAGNOSIS — C678 Malignant neoplasm of overlapping sites of bladder: Secondary | ICD-10-CM | POA: Diagnosis not present

## 2022-09-23 DIAGNOSIS — Z7982 Long term (current) use of aspirin: Secondary | ICD-10-CM | POA: Diagnosis not present

## 2022-10-01 DIAGNOSIS — S0990XA Unspecified injury of head, initial encounter: Secondary | ICD-10-CM | POA: Diagnosis not present

## 2022-10-01 DIAGNOSIS — G44309 Post-traumatic headache, unspecified, not intractable: Secondary | ICD-10-CM | POA: Diagnosis not present

## 2022-10-01 DIAGNOSIS — M47812 Spondylosis without myelopathy or radiculopathy, cervical region: Secondary | ICD-10-CM | POA: Diagnosis not present

## 2022-10-14 DIAGNOSIS — H43393 Other vitreous opacities, bilateral: Secondary | ICD-10-CM | POA: Diagnosis not present

## 2022-10-14 DIAGNOSIS — Z961 Presence of intraocular lens: Secondary | ICD-10-CM | POA: Diagnosis not present

## 2022-10-14 DIAGNOSIS — H353132 Nonexudative age-related macular degeneration, bilateral, intermediate dry stage: Secondary | ICD-10-CM | POA: Diagnosis not present

## 2022-10-14 DIAGNOSIS — H40023 Open angle with borderline findings, high risk, bilateral: Secondary | ICD-10-CM | POA: Diagnosis not present

## 2022-10-14 DIAGNOSIS — H524 Presbyopia: Secondary | ICD-10-CM | POA: Diagnosis not present

## 2022-11-04 DIAGNOSIS — Z5986 Financial insecurity: Secondary | ICD-10-CM | POA: Diagnosis not present

## 2022-11-04 DIAGNOSIS — M8580 Other specified disorders of bone density and structure, unspecified site: Secondary | ICD-10-CM | POA: Diagnosis not present

## 2022-11-04 DIAGNOSIS — M545 Low back pain, unspecified: Secondary | ICD-10-CM | POA: Diagnosis not present

## 2022-11-04 DIAGNOSIS — N400 Enlarged prostate without lower urinary tract symptoms: Secondary | ICD-10-CM | POA: Diagnosis not present

## 2022-11-04 DIAGNOSIS — Z8546 Personal history of malignant neoplasm of prostate: Secondary | ICD-10-CM | POA: Diagnosis not present

## 2022-11-04 DIAGNOSIS — I252 Old myocardial infarction: Secondary | ICD-10-CM | POA: Diagnosis not present

## 2022-11-04 DIAGNOSIS — K219 Gastro-esophageal reflux disease without esophagitis: Secondary | ICD-10-CM | POA: Diagnosis not present

## 2022-11-04 DIAGNOSIS — D696 Thrombocytopenia, unspecified: Secondary | ICD-10-CM | POA: Diagnosis not present

## 2022-11-04 DIAGNOSIS — Z5948 Other specified lack of adequate food: Secondary | ICD-10-CM | POA: Diagnosis not present

## 2022-11-04 DIAGNOSIS — N182 Chronic kidney disease, stage 2 (mild): Secondary | ICD-10-CM | POA: Diagnosis not present

## 2022-11-04 DIAGNOSIS — Z7982 Long term (current) use of aspirin: Secondary | ICD-10-CM | POA: Diagnosis not present

## 2022-11-04 DIAGNOSIS — Z833 Family history of diabetes mellitus: Secondary | ICD-10-CM | POA: Diagnosis not present

## 2022-11-04 DIAGNOSIS — C7900 Secondary malignant neoplasm of unspecified kidney and renal pelvis: Secondary | ICD-10-CM | POA: Diagnosis not present

## 2022-11-04 DIAGNOSIS — C7911 Secondary malignant neoplasm of bladder: Secondary | ICD-10-CM | POA: Diagnosis not present

## 2022-11-04 DIAGNOSIS — E785 Hyperlipidemia, unspecified: Secondary | ICD-10-CM | POA: Diagnosis not present

## 2022-11-04 DIAGNOSIS — M1990 Unspecified osteoarthritis, unspecified site: Secondary | ICD-10-CM | POA: Diagnosis not present

## 2022-11-04 DIAGNOSIS — N3941 Urge incontinence: Secondary | ICD-10-CM | POA: Diagnosis not present

## 2022-11-04 DIAGNOSIS — K5900 Constipation, unspecified: Secondary | ICD-10-CM | POA: Diagnosis not present

## 2022-11-05 DIAGNOSIS — C678 Malignant neoplasm of overlapping sites of bladder: Secondary | ICD-10-CM | POA: Diagnosis not present

## 2022-11-12 DIAGNOSIS — R300 Dysuria: Secondary | ICD-10-CM | POA: Diagnosis not present

## 2022-11-12 DIAGNOSIS — N3091 Cystitis, unspecified with hematuria: Secondary | ICD-10-CM | POA: Diagnosis not present

## 2022-11-17 ENCOUNTER — Other Ambulatory Visit: Payer: Self-pay | Admitting: Family Medicine

## 2022-11-25 ENCOUNTER — Ambulatory Visit: Payer: Medicare PPO | Admitting: Neurology

## 2022-12-03 DIAGNOSIS — Z961 Presence of intraocular lens: Secondary | ICD-10-CM | POA: Diagnosis not present

## 2022-12-03 DIAGNOSIS — H353132 Nonexudative age-related macular degeneration, bilateral, intermediate dry stage: Secondary | ICD-10-CM | POA: Diagnosis not present

## 2022-12-03 DIAGNOSIS — H43393 Other vitreous opacities, bilateral: Secondary | ICD-10-CM | POA: Diagnosis not present

## 2022-12-03 DIAGNOSIS — H40023 Open angle with borderline findings, high risk, bilateral: Secondary | ICD-10-CM | POA: Diagnosis not present

## 2022-12-10 ENCOUNTER — Other Ambulatory Visit: Payer: Self-pay | Admitting: Family Medicine

## 2022-12-10 DIAGNOSIS — I10 Essential (primary) hypertension: Secondary | ICD-10-CM

## 2022-12-11 DIAGNOSIS — C678 Malignant neoplasm of overlapping sites of bladder: Secondary | ICD-10-CM | POA: Diagnosis not present

## 2022-12-11 DIAGNOSIS — N2889 Other specified disorders of kidney and ureter: Secondary | ICD-10-CM | POA: Diagnosis not present

## 2022-12-29 DIAGNOSIS — C678 Malignant neoplasm of overlapping sites of bladder: Secondary | ICD-10-CM | POA: Diagnosis not present

## 2023-01-14 ENCOUNTER — Encounter (HOSPITAL_BASED_OUTPATIENT_CLINIC_OR_DEPARTMENT_OTHER): Payer: Self-pay

## 2023-01-14 ENCOUNTER — Ambulatory Visit (HOSPITAL_BASED_OUTPATIENT_CLINIC_OR_DEPARTMENT_OTHER)
Admission: EM | Admit: 2023-01-14 | Discharge: 2023-01-14 | Disposition: A | Discharge status: Home / Self Care | Payer: Medicare PPO | Attending: Internal Medicine | Admitting: Internal Medicine

## 2023-01-14 DIAGNOSIS — I2510 Atherosclerotic heart disease of native coronary artery without angina pectoris: Secondary | ICD-10-CM | POA: Diagnosis not present

## 2023-01-14 DIAGNOSIS — Z8551 Personal history of malignant neoplasm of bladder: Secondary | ICD-10-CM | POA: Diagnosis not present

## 2023-01-14 DIAGNOSIS — I4891 Unspecified atrial fibrillation: Secondary | ICD-10-CM | POA: Diagnosis not present

## 2023-01-14 DIAGNOSIS — R300 Dysuria: Secondary | ICD-10-CM

## 2023-01-14 DIAGNOSIS — Z87891 Personal history of nicotine dependence: Secondary | ICD-10-CM | POA: Diagnosis not present

## 2023-01-14 DIAGNOSIS — E119 Type 2 diabetes mellitus without complications: Secondary | ICD-10-CM | POA: Diagnosis not present

## 2023-01-14 DIAGNOSIS — I10 Essential (primary) hypertension: Secondary | ICD-10-CM | POA: Diagnosis not present

## 2023-01-14 DIAGNOSIS — Z9049 Acquired absence of other specified parts of digestive tract: Secondary | ICD-10-CM | POA: Diagnosis not present

## 2023-01-14 LAB — POCT URINALYSIS DIP (MANUAL ENTRY)
Bilirubin, UA: NEGATIVE
Blood, UA: NEGATIVE
Glucose, UA: NEGATIVE mg/dL
Ketones, POC UA: NEGATIVE mg/dL
Nitrite, UA: NEGATIVE
Protein Ur, POC: NEGATIVE mg/dL
Spec Grav, UA: 1.025 (ref 1.010–1.025)
Urobilinogen, UA: 0.2 U/dL
pH, UA: 5.5 (ref 5.0–8.0)

## 2023-01-14 NOTE — ED Triage Notes (Signed)
Dx with bladder cancer x 2 years. 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 Procedure to scope area next week. Patient having severe burning when attempting to urinate. Had CT of abd/pelvis on October 31st to assess cancer.

## 2023-01-14 NOTE — Discharge Instructions (Addendum)
Go to the emergency department for further evaluation of your symptoms.

## 2023-01-14 NOTE — ED Provider Notes (Signed)
[facility]    CSN: [id number] Arrival date & time: 01/14/23  8469      History   Chief Complaint Chief Complaint  Patient presents with   Dysuria    HPI [name] is a 82 y.o. male.   The history is provided by the patient and a relative.  Dysuria Presenting symptoms: dysuria   Associated symptoms: abdominal pain (LLQ night before last, dull, now resolved)   Associated symptoms: no diarrhea, no fever, no nausea and no vomiting   Severe dysuria x 1 day admits, frequency and strong smelling odor.  Had left lower quadrant abdominal pain 2 nights ago described as dull, constant, interfered with sleep.  Denies fever, chills, sweats, nausea, vomiting, back pain, hematuria, abdominal pain.  Past medical history includes bladder cancer, currently being  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 06/17/2016   Deficiency of other specified B group vitamins 05/20/2019   Depression 04/04/2019   Diabetes mellitus due to underlying condition with unspecified complications (HCC) 06/28/2019   Diabetes mellitus without complication (HCC)    Type II   Diarrhea 11/21/2018   Dyslipidemia 06/17/2016   Dyspnea on exertion 06/28/2019   Dysrhythmia    Essential (primary) hypertension 06/17/2016   Essential hypertension 06/17/2016   GERD (gastroesophageal reflux disease)    on meds   Greater trochanteric pain syndrome 07/03/2020   Headache    History of hiatal  hernia    History of kidney stones    History of prostate cancer 05/20/2019   Hypercholesteremia 04/04/2019   Hyperlipemia    Hypertension    Impaired fasting blood sugar 05/20/2019   Impingement syndrome of shoulder region 01/12/2018   Impingement syndrome of shoulder region 01/12/2018   Low back pain 02/20/2014   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   Lumbar radiculopathy 05/24/2019   Malignant neoplasm prostate (HCC) 05/20/2019   Medicare annual wellness visit, subsequent 01/16/2020   Mixed hyperlipidemia 08/31/2019   Neck pain 07/03/2015   Paresthesia of hand 01/12/2018   Paresthesia of upper limb 01/12/2018   Paroxysmal atrial fibrillation (HCC) 04/04/2019   PONV (postoperative nausea and vomiting)    Pyelonephritis 06/08/2021   S/P cervical spinal fusion 03/04/2018   S/P lumbar fusion 12/09/2019   S/P lumbar spinal fusion 09/29/2013   S/P lumbar spinal fusion 09/29/2013   Sepsis (HCC) 11/22/2018   Shoulder pain 01/12/2018   Spinal 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 01/26/2018   Stenosis of intervertebral foramina 01/12/2018   Trochanteric bursitis of right hip 04/19/2020    Patient Active Problem List   Diagnosis Date Noted   Abscess of neck 07/03/2022   Bladder tumor 02/06/2022   Urge incontinence 09/29/2021   Muscle cramp 09/29/2021   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   Malignant neoplasm of overlapping sites of bladder (HCC) 03/11/2021   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   Deviated nasal septum  12/25/2020   Hypertrophy of inferior nasal turbinate 12/25/2020   Arthritis    Cataract    Dysrhythmia    GERD (gastroesophageal reflux disease)    History of hiatal hernia    History of kidney stones    PONV (postoperative nausea and vomiting)    S/P lumbar fusion 12/09/2019   Mixed hyperlipidemia 08/31/2019   Angina pectoris (HCC) 08/02/2019   Deficiency of other specified B group vitamins 05/20/2019   Impaired  fasting blood sugar 05/20/2019   History of prostate cancer 05/20/2019   Malignant neoplasm prostate (HCC) 05/20/2019   Depression 04/04/2019   Paroxysmal atrial fibrillation (HCC) 04/04/2019   Colitis 11/22/2018   S/P cervical spinal fusion 03/04/2018   Carpal tunnel syndrome 01/18/2018   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 06/17/2016   Cervical spondylosis without myelopathy 09/24/2015    Past Surgical History:  Procedure Laterality Date   anal fissures     ANTERIOR CERVICAL DECOMP/DISCECTOMY FUSION N/A 03/04/2018   Procedure: Anterior Cervical Decompression Fusion - Cervical three-Cervical four - Cervical four-Cervical five;  Surgeon: Tia Alert, MD;  Location: [facility] OR;  Service: Neurosurgery;  Laterality: N/A;   ANTERIOR LAT LUMBAR FUSION N/A 05/30/2020   Procedure: Anterior Lateral Lumbar Fusion Lumbar One-Two with Lateral Plate Removal of pedicle screws Lumbar One and Posterior Lateral Fusion Lumbar One-Lumbar Three;  Surgeon: Tia Alert, MD;  Location: [facility] OR;  Service: Neurosurgery;  Laterality: N/A;   BACK SURGERY     CARDIAC CATHETERIZATION     no PCI   CARPAL TUNNEL RELEASE Left 03/04/2018   Procedure: Carpal Tunnel Release - left;  Surgeon: Tia Alert, MD;  Location: [facility] OR;  Service: Neurosurgery;  Laterality: Left;   CHOLECYSTECTOMY     COLONOSCOPY W/ POLYPECTOMY  08/30/2012   Colonic polyp status post polypectomy.  Pancolonic diverticulosis predominantly in the sigmoid colon. Small internal hemorrhoids. Moderate internal hemorhroids.    ESOPHAGOGASTRODUODENOSCOPY  08/04/2016   Schatzkis ring status post esophageal dilatation. Small hiatal hernia. Mild gastritis.    EYE SURGERY     cataract removal bilateral eyes   FOOT SURGERY     left heel   HARDWARE REMOVAL N/A 05/30/2020   Procedure: HARDWARE REMOVAL;  Surgeon: Tia Alert, MD;  Location: [facility] OR;  Service: Neurosurgery;  Laterality: N/A;   HERNIA REPAIR      LAMINECTOMY WITH POSTERIOR LATERAL ARTHRODESIS LEVEL 2 N/A 12/09/2019   Procedure: Laminectomy and Foraminotomy - Lumbar one-two, Lumbar two-three, posterolateral instrumented fusion Lumbar one-three, removal of instrumentation Lumbar three-five;  Surgeon: Tia Alert, MD;  Location: [facility] OR;  Service: Neurosurgery;  Laterality: N/A;   LEFT HEART CATH AND CORONARY ANGIOGRAPHY N/A 08/03/2019   Procedure: LEFT HEART CATH AND CORONARY ANGIOGRAPHY;  Surgeon: Corky Crafts, MD;  Location: [facility] INVASIVE CV LAB;  Service: Cardiovascular;  Laterality: N/A;   SHOULDER SURGERY     bilateral   WISDOM TOOTH EXTRACTION         Home Medications    Prior to Admission medications   Medication Sig Start Date End Date Taking? Authorizing Provider  aspirin EC (ASPIRIN LOW DOSE) 81 MG tablet Take 1 tablet (81 mg total) by mouth daily. Swallow whole. 01/07/22   Cox, Fritzi Mandes, MD  atorvastatin (LIPITOR) 40 MG tablet TAKE 1 TABLET BY MOUTH EVERY DAY 11/17/22   Cox, Fritzi Mandes, MD  carbamazepine (TEGRETOL XR) 100 MG 12 hr tablet Take 1 tablet (100 mg total) by mouth 2 (two) times daily.  07/24/22   Drema Dallas, DO  carboxymethylcellulose (REFRESH PLUS) 0.5 % SOLN Place 1 drop into both eyes 3 (three) times daily as needed for dry eyes (dry/irritated eyes).    [provider]  cholecalciferol (VITAMIN D) 1000 UNITS tablet Take 1,000 Units by mouth in the morning.    [provider]  lisinopril (ZESTRIL) 20 MG tablet TAKE 1 TABLET BY MOUTH EVERY DAY 12/10/22   Cox, Kirsten, MD  metFORMIN (GLUCOPHAGE) 500 MG tablet TAKE 1 TABLET (500 MG TOTAL) BY MOUTH DAILY. 09/18/22   Cox, Fritzi Mandes, MD  nitroGLYCERIN (NITROSTAT) 0.4 MG SL tablet Place 1 tablet (0.4 mg total) under the tongue every 5 (five) minutes x 3 doses as needed for chest pain. 06/28/19   Revankar, Aundra Dubin, MD  tamsulosin (FLOMAX) 0.4 MG CAPS capsule Take 1 capsule (0.4 mg total) by mouth daily after supper. 04/24/22   Cox, Kirsten, MD  VASCEPA 1 g  capsule TAKE 2 CAPSULES BY MOUTH 2 TIMES DAILY. 11/17/22   CoxFritzi Mandes, MD    Family History Family History  Problem Relation Age of Onset   Colon cancer Neg Hx    Esophageal cancer Neg Hx    Rectal cancer Neg Hx    Stomach cancer Neg Hx    Colon polyps Neg Hx     Social History Social History   Tobacco Use   Smoking status: Former    Current packs/day: 0.00    Average packs/day: 0.5 packs/day for 35.0 years (17.5 ttl pk-yrs)    Types: Cigarettes    Start date: 02/10/1958    Quit date: 02/10/1993    Years since quitting: 29.9   Smokeless tobacco: Former    Types: Chew    Quit date: 02/10/2006  Vaping Use   Vaping status: Never Used  Substance Use Topics   Alcohol use: Not Currently    Alcohol/week: 1.0 standard drink of alcohol    Types: 1 Cans of beer per week    Comment: occ   Drug use: No     Allergies   Oxycodone   Review of Systems Review of Systems  Constitutional:  Positive for fatigue. Negative for chills and fever.  Respiratory:  Negative for cough and shortness of breath.   Gastrointestinal:  Positive for abdominal pain (LLQ night before last, dull, now resolved). Negative for diarrhea, nausea and vomiting.  Genitourinary:  Positive for dysuria. Negative for testicular pain and urgency.     Physical Exam Triage Vital Signs ED Triage Vitals  Encounter Vitals Group     BP 01/14/23 0944 (!) 163/77     Systolic BP Percentile --      Diastolic BP Percentile --      Pulse Rate 01/14/23 0944 86     Resp 01/14/23 0944 20     Temp 01/14/23 0944 98.1 F (36.7 C)     Temp Source 01/14/23 0944 Oral     SpO2 01/14/23 0944 95 %     Weight --      Height --      Head Circumference --      Peak Flow --      Pain Score 01/14/23 0946 10     Pain Loc --      Pain Education --      Exclude from Growth Chart --    No data found.  Updated Vital Signs BP (!) 163/77 (BP Location: Right Arm)   Pulse 86   Temp 98.1 F (36.7 C) (Oral)  Resp 20   SpO2 95%    Visual Acuity Right Eye Distance:   Left Eye Distance:   Bilateral Distance:    Right Eye Near:   Left Eye Near:    Bilateral Near:     Physical Exam Vitals and nursing note reviewed.  Constitutional:      Appearance: He is not ill-appearing.  HENT:     Head: Normocephalic and atraumatic.     Right Ear: Tympanic membrane normal.     Left Ear: Tympanic membrane normal.  Eyes:     Conjunctiva/sclera: Conjunctivae normal.  Cardiovascular:     Rate and Rhythm: Normal rate.  Pulmonary:     Effort: Pulmonary effort is normal.     Breath sounds: Normal breath sounds.  Abdominal:     General: Bowel sounds are normal.     Tenderness: There is no abdominal tenderness. There is left CVA tenderness.  Skin:    General: Skin is warm and dry.  Neurological:     Mental Status: He is alert and oriented to person, place, and time.  Psychiatric:        Mood and Affect: Mood normal.      UC Treatments / Results  Labs (all labs ordered are listed, but only abnormal results are displayed) Labs Reviewed  URINE CULTURE  POCT URINALYSIS DIP (MANUAL ENTRY)    EKG   Radiology No results found.  Procedures Procedures (including critical care time)  Medications Ordered in UC Medications - No data to display  Initial Impression / Assessment and Plan / UC Course  I have reviewed the triage vital signs and the nursing notes.  Pertinent labs & imaging results that were available during my care of the patient were reviewed by me and considered in my medical decision making (see chart for details).     82 year old male with history of bladder cancer currently being evaluated for possible cancer in his left kidney.  He complains of severe dysuria x 1 day associated with malodorous urine and frequency.  He did have left lower quadrant pain for several hours throughout the night 2 nights ago now resolved. He has mild left CVA tenderness, mildly hypertensive at 163/77 otherwise  well-appearing.  Point-of-care urine trace leuks otherwise normal.  Discussed with patient this is not consistent with UTI.  I feel he would benefit from ED evaluation given his underlying medical conditions, reports of recent left lower quadrant pain may need imaging studies or lab work to check kidney function, urine microscopy.  Patient and family agree with plan they will take him to the emergency department  Final Clinical Impressions(s) / UC Diagnoses   Final diagnoses:  Dysuria   Discharge Instructions   None    ED Prescriptions   None    PDMP not reviewed this encounter.   Meliton Rattan, [place] 01/14/23 1030

## 2023-01-15 ENCOUNTER — Ambulatory Visit: Payer: Medicare PPO

## 2023-01-15 VITALS — Ht 69.0 in | Wt 185.0 lb

## 2023-01-15 DIAGNOSIS — Z0000 Encounter for general adult medical examination without abnormal findings: Secondary | ICD-10-CM | POA: Diagnosis not present

## 2023-01-15 NOTE — Patient Instructions (Signed)
Mr. Cardella , Thank you for taking time to come for your Medicare Wellness Visit. I appreciate your ongoing commitment to your health goals. Please review the following plan we discussed and let me know if I can assist you in the future.   Referrals/Orders/Follow-Ups/Clinician Recommendations: No  This is a list of the screening recommended for you and due dates:  Health Maintenance  Topic Date Due   Eye exam for diabetics  Never done   Yearly kidney health urinalysis for diabetes  Never done   DTaP/Tdap/Td vaccine (1 - Tdap) Never done   Zoster (Shingles) Vaccine (1 of 2) 05/15/1959   Flu Shot  09/11/2022   COVID-19 Vaccine (5 - 2023-24 season) 10/12/2022   Hemoglobin A1C  02/20/2023   Yearly kidney function blood test for diabetes  08/20/2023   Medicare Annual Wellness Visit  01/15/2024   Pneumonia Vaccine  Completed   HPV Vaccine  Aged Out   Complete foot exam   Discontinued    Advanced directives: (Copy Requested) Please bring a copy of your health care power of attorney and living will to the office to be added to your chart at your convenience.  Next Medicare Annual Wellness Visit scheduled for next year: No

## 2023-01-15 NOTE — Progress Notes (Signed)
Subjective:   [name] is a 82 y.o. male who presents for Medicare Annual/Subsequent preventive examination.  Visit Complete: Virtual I connected with  [name] on 01/15/23 by a audio enabled telemedicine application and verified that I am speaking with the correct person using two identifiers.  Patient Location: Home  Provider Location: Home Office  I discussed the limitations of evaluation and management by telemedicine. The patient expressed understanding and agreed to proceed.  Vital Signs: Because this visit was a virtual/telehealth visit, some criteria may be missing or patient reported. Any vitals not documented were not able to be obtained and vitals that have been documented are patient reported.  Patient Medicare AWV questionnaire was completed by the patient on 01/15/2023; I have confirmed that all information answered by patient is correct and no changes since this date.  Cardiac Risk Factors include: advanced age (>47men, >52 women);hypertension;male gender     Objective:    Today's Vitals   01/15/23 1304  Weight: 185 lb (83.9 kg)  Height: 5\' 9"  (1.753 m)  PainSc: 5    Body mass index is 27.32 kg/m.     01/15/2023    1:06 PM 07/24/2022   11:12 AM 01/17/2021   11:01 AM 11/21/2020    8:59 AM 05/30/2020   11:37 AM 05/29/2020    9:02 AM 01/16/2020    9:29 AM  Advanced Directives  Does Patient Have a Medical Advance Directive? Yes Yes Yes Yes Yes Yes No  Type of Estate agent of [place];Living will Healthcare Power of [place];Living will Healthcare Power of [place];Living will Healthcare Power of [employer] of [place];Living will Healthcare Power of [place];Living will   Does patient want to make changes to medical advance directive?   No - Patient declined   No - Patient declined   Copy of Healthcare Power of Attorney in Chart? No - copy requested  No - copy requested  No - copy requested No - copy requested      Current Medications (verified) Outpatient Encounter Medications as of 01/15/2023  Medication Sig   aspirin EC (ASPIRIN LOW DOSE) 81 MG tablet Take 1 tablet (81 mg total) by mouth daily. Swallow whole.   atorvastatin (LIPITOR) 40 MG tablet TAKE 1 TABLET BY MOUTH EVERY DAY   carbamazepine (TEGRETOL XR) 100 MG 12 hr tablet Take 1 tablet (100 mg total) by mouth 2 (two) times daily.   carboxymethylcellulose (REFRESH PLUS) 0.5 % SOLN Place 1 drop into both eyes 3 (three) times daily as needed for dry eyes (dry/irritated eyes).   cholecalciferol (VITAMIN D) 1000 UNITS tablet Take 1,000 Units by mouth in the morning.   lisinopril (ZESTRIL) 20 MG tablet TAKE 1 TABLET BY MOUTH EVERY DAY   metFORMIN (GLUCOPHAGE) 500 MG tablet TAKE 1 TABLET (500 MG TOTAL) BY MOUTH DAILY.   nitroGLYCERIN (NITROSTAT) 0.4 MG SL tablet Place 1 tablet (0.4 mg total) under the tongue every 5 (five) minutes x 3 doses as needed for chest pain.   tamsulosin (FLOMAX) 0.4 MG CAPS capsule Take 1 capsule (0.4 mg total) by mouth daily after supper.   VASCEPA 1 g capsule TAKE 2 CAPSULES BY MOUTH 2 TIMES DAILY.   No facility-administered encounter medications on file as of 01/15/2023.    Allergies (verified) Oxycodone   History: Past Medical History:  Diagnosis Date   Angina pectoris (HCC) 08/02/2019   Arthritis    on meds   Atrial fibrillation (HCC)    Backache 08/17/2012  Body mass index (BMI) 26.0-26.9, adult 05/08/2020   Cancer (HCC) 10/2015   prostate cancer treated with radiation   Carpal tunnel syndrome 01/18/2018   Cataract    sx-bilaterally   Cervical spondylosis without myelopathy 09/24/2015   Chest tightness 06/28/2019   Chronic back pain 05/30/2013   Chronic low back pain 02/20/2014   Colitis 11/22/2018   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 06/17/2016   Deficiency of other specified B group vitamins 05/20/2019   Depression 04/04/2019   Diabetes mellitus due to underlying  condition with unspecified complications (HCC) 06/28/2019   Diabetes mellitus without complication (HCC)    Type II   Diarrhea 11/21/2018   Dyslipidemia 06/17/2016   Dyspnea on exertion 06/28/2019   Dysrhythmia    Essential (primary) hypertension 06/17/2016   Essential hypertension 06/17/2016   GERD (gastroesophageal reflux disease)    on meds   Greater trochanteric pain syndrome 07/03/2020   Headache    History of hiatal hernia    History of kidney stones    History of prostate cancer 05/20/2019   Hypercholesteremia 04/04/2019   Hyperlipemia    Hypertension    Impaired fasting blood sugar 05/20/2019   Impingement syndrome of shoulder region 01/12/2018   Impingement syndrome of shoulder region 01/12/2018   Low back pain 02/20/2014   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   Lumbar radiculopathy 05/24/2019   Malignant neoplasm prostate (HCC) 05/20/2019   Medicare annual wellness visit, subsequent 01/16/2020   Mixed hyperlipidemia 08/31/2019   Neck pain 07/03/2015   Paresthesia of hand 01/12/2018   Paresthesia of upper limb 01/12/2018   Paroxysmal atrial fibrillation (HCC) 04/04/2019   PONV (postoperative nausea and vomiting)    Pyelonephritis 06/08/2021   S/P cervical spinal fusion 03/04/2018   S/P lumbar fusion 12/09/2019   S/P lumbar spinal fusion 09/29/2013   S/P lumbar spinal fusion 09/29/2013   Sepsis (HCC) 11/22/2018   Shoulder pain 01/12/2018   Spinal 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 01/26/2018   Stenosis of intervertebral foramina 01/12/2018   Trochanteric bursitis of right hip 04/19/2020   Past Surgical History:  Procedure Laterality Date   anal fissures     ANTERIOR CERVICAL DECOMP/DISCECTOMY FUSION N/A 03/04/2018   Procedure: Anterior Cervical Decompression Fusion - Cervical three-Cervical four - Cervical four-Cervical five;  Surgeon: Tia Alert, MD;  Location: [facility] OR;  Service: Neurosurgery;  Laterality: N/A;   ANTERIOR LAT LUMBAR FUSION N/A 05/30/2020   Procedure: Anterior Lateral Lumbar Fusion  Lumbar One-Two with Lateral Plate Removal of pedicle screws Lumbar One and Posterior Lateral Fusion Lumbar One-Lumbar Three;  Surgeon: Tia Alert, MD;  Location: [facility] OR;  Service: Neurosurgery;  Laterality: N/A;   BACK SURGERY     CARDIAC CATHETERIZATION     no PCI   CARPAL TUNNEL RELEASE Left 03/04/2018   Procedure: Carpal Tunnel Release - left;  Surgeon: Tia Alert, MD;  Location: [facility] OR;  Service: Neurosurgery;  Laterality: Left;   CHOLECYSTECTOMY     COLONOSCOPY W/ POLYPECTOMY  08/30/2012   Colonic polyp status post polypectomy.  Pancolonic diverticulosis predominantly in the sigmoid colon. Small internal hemorrhoids. Moderate internal hemorhroids.    ESOPHAGOGASTRODUODENOSCOPY  08/04/2016   Schatzkis ring status post esophageal dilatation. Small hiatal hernia. Mild gastritis.    EYE SURGERY     cataract removal bilateral eyes   FOOT SURGERY     left heel   HARDWARE REMOVAL N/A 05/30/2020   Procedure: HARDWARE REMOVAL;  Surgeon: Tia Alert, MD;  Location: [facility]  OR;  Service: Neurosurgery;  Laterality: N/A;   HERNIA REPAIR     LAMINECTOMY WITH POSTERIOR LATERAL ARTHRODESIS LEVEL 2 N/A 12/09/2019   Procedure: Laminectomy and Foraminotomy - Lumbar one-two, Lumbar two-three, posterolateral instrumented fusion Lumbar one-three, removal of instrumentation Lumbar three-five;  Surgeon: Tia Alert, MD;  Location: [facility] OR;  Service: Neurosurgery;  Laterality: N/A;   LEFT HEART CATH AND CORONARY ANGIOGRAPHY N/A 08/03/2019   Procedure: LEFT HEART CATH AND CORONARY ANGIOGRAPHY;  Surgeon: Corky Crafts, MD;  Location: [facility] INVASIVE CV LAB;  Service: Cardiovascular;  Laterality: N/A;   SHOULDER SURGERY     bilateral   WISDOM TOOTH EXTRACTION     Family History  Problem Relation Age of Onset   Colon cancer Neg Hx    Esophageal cancer Neg Hx    Rectal cancer Neg Hx    Stomach cancer Neg Hx    Colon polyps Neg Hx    Social History   Socioeconomic History   Marital status: Widowed     Spouse name: Not on file   Number of children: 2   Years of education: Not on file   Highest education level: 11th grade  Occupational History   Not on file  Tobacco Use   Smoking status: Former    Current packs/day: 0.00    Average packs/day: 0.5 packs/day for 35.0 years (17.5 ttl pk-yrs)    Types: Cigarettes    Start date: 02/10/1958    Quit date: 02/10/1993    Years since quitting: 29.9   Smokeless tobacco: Former    Types: Chew    Quit date: 02/10/2006  Vaping Use   Vaping status: Never Used  Substance and Sexual Activity   Alcohol use: Not Currently    Alcohol/week: 1.0 standard drink of alcohol    Types: 1 Cans of beer per week    Comment: occ   Drug use: No   Sexual activity: Yes    Partners: Female  Other Topics Concern   Not on file  Social History Narrative   Not on file   Social Determinants of Health   Financial Resource Strain: Low Risk  (01/15/2023)   Overall Financial Resource Strain (CARDIA)    Difficulty of Paying Living Expenses: Not hard at all  Food Insecurity: No Food Insecurity (01/15/2023)   Hunger Vital Sign    Worried About Running Out of Food in the Last Year: Never true    Ran Out of Food in the Last Year: Never true  Transportation Needs: No Transportation Needs (01/15/2023)   PRAPARE - Administrator, Civil Service (Medical): No    Lack of Transportation (Non-Medical): No  Physical Activity: Inactive (01/15/2023)   Exercise Vital Sign    Days of Exercise per Week: 0 days    Minutes of Exercise per Session: 0 min  Stress: No Stress Concern Present (01/15/2023)   [employer] of Occupational Health - Occupational Stress Questionnaire    Feeling of Stress : Not at all  Social Connections: Moderately Isolated (01/15/2023)   Social Connection and Isolation Panel [NHANES]    Frequency of Communication with Friends and Family: More than three times a week    Frequency of Social Gatherings with Friends and Family: More than three times  a week    Attends Religious Services: More than 4 times per year    Active Member of [employer] or Organizations: No    Attends Banker Meetings: Patient declined    Marital Status: Widowed  Tobacco Counseling Counseling given: Not Answered   Clinical Intake:  Pre-visit preparation completed: Yes  Pain : 0-10 Pain Score: 5  Pain Type: Acute pain Pain Location: Other (Comment) (Painful Urination) Pain Descriptors / Indicators: Burning, Other (Comment) (stinging sensation)     BMI - recorded: 27.32 Nutritional Status: BMI 25 -29 Overweight Nutritional Risks: None Diabetes: No  How often do you need to have someone help you when you read instructions, pamphlets, or other written materials from your doctor or pharmacy?: 1 - Never What is the last grade level you completed in school?: HSG  Interpreter Needed?: No  Information entered by :: Susie Cassette, LPN.   Activities of Daily Living    01/15/2023    1:17 PM 01/15/2023    8:02 AM  In your present state of health, do you have any difficulty performing the following activities:  Hearing? 1 1  Vision? 1 1  Difficulty concentrating or making decisions? 1 1  Walking or climbing stairs? 1 1  Dressing or bathing? 0 0  Doing errands, shopping? 0 0  Preparing Food and eating ? N N  Using the Toilet? N N  In the past six months, have you accidently leaked urine? Y Y  Do you have problems with loss of bowel control? N N  Managing your Medications? N N  Managing your Finances? N N  Housekeeping or managing your Housekeeping? N N    Patient Care Team: CoxFritzi Mandes, MD as PCP - General (Family Medicine) Revankar, Aundra Dubin, MD as Consulting Physician (Cardiology) Dawley, Alan Mulder, DO as Consulting Physician Yetta Barre Thomes Dinning, MD as Consulting Physician (Neurosurgery)  Indicate any recent Medical Services you may have received from other than [facility] providers in the past year (date may be approximate).      Assessment:   This is a routine wellness examination for [place].  Hearing/Vision screen Hearing Screening - Comments:: Patient has hearing difficulty and wears hearing aids. Vision Screening - Comments:: Wears rx glasses - up to date with routine eye exams with [facility]    Goals Addressed             This Visit's Progress    Client understands the importance of follow-up with providers by attending scheduled visits        Depression Screen    01/15/2023    1:07 PM 08/20/2022    7:27 AM 07/03/2022    1:54 PM 04/24/2022    1:59 PM 01/07/2022   10:34 AM 01/07/2022    7:56 AM 01/07/2022    7:34 AM  PHQ 2/9 Scores  PHQ - 2 Score 0 0 0 0 0 0 0  PHQ- 9 Score 0          Fall Risk    01/15/2023    1:16 PM 01/15/2023    8:02 AM 08/20/2022    7:27 AM 07/24/2022   11:12 AM 07/03/2022    1:53 PM  Fall Risk   Falls in the past year? 1 1 1  0 1  Number falls in past yr: 1 1 0 0 1  Injury with Fall? 0 0 0 0 1  Risk for fall due to : History of fall(s)  History of fall(s)  Impaired balance/gait  Follow up Education provided;Falls prevention discussed;Falls evaluation completed  Falls evaluation completed;Falls prevention discussed Falls evaluation completed Falls evaluation completed;Falls prevention discussed    MEDICARE RISK AT HOME: Medicare Risk at Home Any stairs in or around the home?: Yes  If so, are there any without handrails?: No Home free of loose throw rugs in walkways, pet beds, electrical cords, etc?: Yes Adequate lighting in your home to reduce risk of falls?: Yes Life alert?: No Use of a cane, walker or w/c?: No Grab bars in the bathroom?: Yes Shower chair or bench in shower?: Yes Elevated toilet seat or a handicapped toilet?: Yes  TIMED UP AND GO:  Was the test performed?  No    Cognitive Function:    01/15/2023    1:19 PM  MMSE - Mini Mental State Exam  Not completed: Unable to complete        01/15/2023    1:19 PM 02/21/2022    9:32 AM  01/17/2021   11:51 AM 01/16/2020    9:30 AM  6CIT Screen  What Year? 0 points 0 points 0 points 0 points  What month? 0 points 0 points 0 points 0 points  What time? 0 points 0 points 0 points 0 points  Count back from 20 0 points 0 points 0 points 0 points  Months in reverse 0 points 0 points 0 points 0 points  Repeat phrase 0 points 2 points 2 points 0 points  Total Score 0 points 2 points 2 points 0 points    Immunizations Immunization History  Administered Date(s) Administered   Fluad Quad(high Dose 65+) 12/02/2019, 01/17/2021, 01/07/2022   Influenza-Unspecified 12/11/2017, 11/19/2018   Moderna Covid-19 Vaccine Bivalent Booster 56yrs & up 03/27/2021   Moderna SARS-COV2 Booster Vaccination 07/11/2020   Moderna Sars-Covid-2 Vaccination 03/21/2019, 04/18/2019   Pfizer(Comirnaty)Fall Seasonal Vaccine 12 years and older 01/07/2022   Pneumococcal Conjugate-13 09/16/2013   Pneumococcal Polysaccharide-23 08/07/2017   Zoster, Live 09/16/2013    TDAP status: Due, Education has been provided regarding the importance of this vaccine. Advised may receive this vaccine at local pharmacy or Health Dept. Aware to provide a copy of the vaccination record if obtained from local pharmacy or Health Dept. Verbalized acceptance and understanding.  Flu Vaccine status: Due, Education has been provided regarding the importance of this vaccine. Advised may receive this vaccine at local pharmacy or Health Dept. Aware to provide a copy of the vaccination record if obtained from local pharmacy or Health Dept. Verbalized acceptance and understanding.  Pneumococcal vaccine status: Up to date  Covid-19 vaccine status: Completed vaccines  Qualifies for Shingles Vaccine? Yes   Zostavax completed Yes   Shingrix Completed?: No.    Education has been provided regarding the importance of this vaccine. Patient has been advised to call insurance company to determine out of pocket expense if they have not yet received  this vaccine. Advised may also receive vaccine at local pharmacy or Health Dept. Verbalized acceptance and understanding.  Screening Tests Health Maintenance  Topic Date Due   OPHTHALMOLOGY EXAM  Never done   Diabetic kidney evaluation - Urine ACR  Never done   DTaP/Tdap/Td (1 - Tdap) Never done   Zoster Vaccines- Shingrix (1 of 2) 05/15/1959   INFLUENZA VACCINE  09/11/2022   COVID-19 Vaccine (5 - 2023-24 season) 10/12/2022   HEMOGLOBIN A1C  02/20/2023   Diabetic kidney evaluation - eGFR measurement  08/20/2023   Medicare Annual Wellness (AWV)  01/15/2024   Pneumonia Vaccine 36+ Years old  Completed   HPV VACCINES  Aged Out   FOOT EXAM  Discontinued    Health Maintenance  Health Maintenance Due  Topic Date Due   OPHTHALMOLOGY EXAM  Never done   Diabetic kidney evaluation - Urine ACR  Never done   DTaP/Tdap/Td (1 - Tdap) Never done   Zoster Vaccines- Shingrix (1 of 2) 05/15/1959   INFLUENZA VACCINE  09/11/2022   COVID-19 Vaccine (5 - 2023-24 season) 10/12/2022    Colorectal cancer screening: No longer required.   Lung Cancer Screening: (Low Dose CT Chest recommended if Age 40-80 years, 20 pack-year currently smoking OR have quit w/in 15years.) does not qualify.   Lung Cancer Screening Referral: no  Additional Screening:  Hepatitis C Screening: does not qualify; Completed: no  Vision Screening: Recommended annual ophthalmology exams for early detection of glaucoma and other disorders of the eye. Is the patient up to date with their annual eye exam?  Yes  Who is the provider or what is the name of the office in which the patient attends annual eye exams? [facility] If pt is not established with a provider, would they like to be referred to a provider to establish care? No .   Dental Screening: Recommended annual dental exams for proper oral hygiene  Diabetic Foot Exam: 01/07/2022; patient is overdue.  Community Resource Referral / Chronic Care Management: CRR  required this visit?  No   CCM required this visit?  No     Plan:     I have personally reviewed and noted the following in the patient's chart:   Medical and social history Use of alcohol, tobacco or illicit drugs  Current medications and supplements including opioid prescriptions. Patient is not currently taking opioid prescriptions. Functional ability and status Nutritional status Physical activity Advanced directives List of other physicians Hospitalizations, surgeries, and ER visits in previous 12 months Vitals Screenings to include cognitive, depression, and falls Referrals and appointments  In addition, I have reviewed and discussed with patient certain preventive protocols, quality metrics, and best practice recommendations. A written personalized care plan for preventive services as well as general preventive health recommendations were provided to patient.     Mickeal Needy, LPN   [id number]/[id number]   After Visit Summary: (MyChart) Due to this being a telephonic visit, the after visit summary with patients personalized plan was offered to patient via MyChart   Nurse Notes: Patient is due flu vaccine and foot exam.

## 2023-01-22 ENCOUNTER — Other Ambulatory Visit: Payer: Self-pay | Admitting: Family Medicine

## 2023-01-22 DIAGNOSIS — Z7984 Long term (current) use of oral hypoglycemic drugs: Secondary | ICD-10-CM | POA: Diagnosis not present

## 2023-01-22 DIAGNOSIS — D090 Carcinoma in situ of bladder: Secondary | ICD-10-CM | POA: Diagnosis not present

## 2023-01-22 DIAGNOSIS — I10 Essential (primary) hypertension: Secondary | ICD-10-CM | POA: Diagnosis not present

## 2023-01-22 DIAGNOSIS — Z79899 Other long term (current) drug therapy: Secondary | ICD-10-CM | POA: Diagnosis not present

## 2023-01-22 DIAGNOSIS — C652 Malignant neoplasm of left renal pelvis: Secondary | ICD-10-CM | POA: Diagnosis not present

## 2023-01-22 DIAGNOSIS — K219 Gastro-esophageal reflux disease without esophagitis: Secondary | ICD-10-CM

## 2023-01-22 DIAGNOSIS — Z87440 Personal history of urinary (tract) infections: Secondary | ICD-10-CM | POA: Diagnosis not present

## 2023-01-22 DIAGNOSIS — Z8553 Personal history of malignant neoplasm of renal pelvis: Secondary | ICD-10-CM | POA: Diagnosis not present

## 2023-01-22 DIAGNOSIS — Z7982 Long term (current) use of aspirin: Secondary | ICD-10-CM | POA: Diagnosis not present

## 2023-01-22 DIAGNOSIS — C678 Malignant neoplasm of overlapping sites of bladder: Secondary | ICD-10-CM | POA: Diagnosis not present

## 2023-01-22 DIAGNOSIS — D414 Neoplasm of uncertain behavior of bladder: Secondary | ICD-10-CM | POA: Diagnosis not present

## 2023-01-22 DIAGNOSIS — N2889 Other specified disorders of kidney and ureter: Secondary | ICD-10-CM | POA: Diagnosis not present

## 2023-01-22 DIAGNOSIS — I480 Paroxysmal atrial fibrillation: Secondary | ICD-10-CM | POA: Diagnosis not present

## 2023-01-28 ENCOUNTER — Other Ambulatory Visit: Payer: Self-pay

## 2023-01-28 ENCOUNTER — Encounter: Payer: Self-pay | Admitting: Family Medicine

## 2023-01-28 DIAGNOSIS — H401132 Primary open-angle glaucoma, bilateral, moderate stage: Secondary | ICD-10-CM | POA: Diagnosis not present

## 2023-01-28 DIAGNOSIS — H43393 Other vitreous opacities, bilateral: Secondary | ICD-10-CM | POA: Diagnosis not present

## 2023-01-28 DIAGNOSIS — K219 Gastro-esophageal reflux disease without esophagitis: Secondary | ICD-10-CM

## 2023-01-28 DIAGNOSIS — Z961 Presence of intraocular lens: Secondary | ICD-10-CM | POA: Diagnosis not present

## 2023-01-28 DIAGNOSIS — H353132 Nonexudative age-related macular degeneration, bilateral, intermediate dry stage: Secondary | ICD-10-CM | POA: Diagnosis not present

## 2023-01-28 MED ORDER — OMEPRAZOLE 40 MG PO CPDR: 40.0000 mg | DELAYED_RELEASE_CAPSULE | Freq: Every day | ORAL | 1 refills | Status: DC

## 2023-02-06 ENCOUNTER — Encounter: Payer: Self-pay | Admitting: Family Medicine

## 2023-02-06 ENCOUNTER — Other Ambulatory Visit: Payer: Self-pay | Admitting: Family Medicine

## 2023-02-09 ENCOUNTER — Other Ambulatory Visit: Payer: Self-pay

## 2023-02-09 MED ORDER — ATORVASTATIN CALCIUM 40 MG PO TABS: 40.0000 mg | ORAL_TABLET | Freq: Every day | ORAL | 0 refills | Status: DC

## 2023-02-13 DIAGNOSIS — D4112 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: Secondary | ICD-10-CM | POA: Diagnosis not present

## 2023-02-13 DIAGNOSIS — Z8551 Personal history of malignant neoplasm of bladder: Secondary | ICD-10-CM | POA: Diagnosis not present

## 2023-02-26 ENCOUNTER — Other Ambulatory Visit: Payer: Self-pay | Admitting: Family Medicine

## 2023-02-26 DIAGNOSIS — D4112 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: Secondary | ICD-10-CM | POA: Diagnosis not present

## 2023-02-26 DIAGNOSIS — R109 Unspecified abdominal pain: Secondary | ICD-10-CM | POA: Diagnosis not present

## 2023-02-26 DIAGNOSIS — R300 Dysuria: Secondary | ICD-10-CM | POA: Diagnosis not present

## 2023-02-26 MED ORDER — ICOSAPENT ETHYL 1 G PO CAPS: 2.0000 g | ORAL_CAPSULE | Freq: Two times a day (BID) | ORAL | 1 refills | Status: AC

## 2023-03-03 DIAGNOSIS — Z8551 Personal history of malignant neoplasm of bladder: Secondary | ICD-10-CM | POA: Diagnosis not present

## 2023-03-03 DIAGNOSIS — R109 Unspecified abdominal pain: Secondary | ICD-10-CM | POA: Diagnosis not present

## 2023-03-05 ENCOUNTER — Telehealth: Payer: Self-pay

## 2023-03-05 NOTE — Telephone Encounter (Signed)
Patient aware he needs appt states he will call our office back to schedule appt.

## 2023-03-05 NOTE — Telephone Encounter (Signed)
Copied from CRM [id number]. Topic: Clinical - Medical Advice >> Mar 05, 2023  9:08 AM [name] wrote: Reason for CRM: Patient wants to be contacted about results being high for urine and also because they are high about his diabetes. He has been in a lot of pain but dont have Kidney surgery until March 5th, 2025. Patient wants his wife to contacted so she can better explain everything that needs to be spoken about and handled. Wife contact number is [phone] Was expressed if needed to show in person for anything it is willing as well.

## 2023-03-11 ENCOUNTER — Ambulatory Visit: Payer: Medicare PPO | Admitting: Physician Assistant

## 2023-03-14 ENCOUNTER — Other Ambulatory Visit: Payer: Self-pay | Admitting: Family Medicine

## 2023-03-19 ENCOUNTER — Encounter (HOSPITAL_BASED_OUTPATIENT_CLINIC_OR_DEPARTMENT_OTHER): Payer: Self-pay

## 2023-03-19 ENCOUNTER — Ambulatory Visit (HOSPITAL_BASED_OUTPATIENT_CLINIC_OR_DEPARTMENT_OTHER)
Admission: RE | Admit: 2023-03-19 | Discharge: 2023-03-19 | Disposition: A | Discharge status: Home / Self Care | Payer: Medicare PPO | Source: Ambulatory Visit | Attending: Family Medicine | Admitting: Family Medicine

## 2023-03-19 VITALS — BP 160/85 | HR 64 | Temp 98.6°F | Resp 20

## 2023-03-19 DIAGNOSIS — K209 Esophagitis, unspecified without bleeding: Secondary | ICD-10-CM

## 2023-03-19 DIAGNOSIS — K920 Hematemesis: Secondary | ICD-10-CM

## 2023-03-19 NOTE — Discharge Instructions (Addendum)
 Get back on your Prilosec daily If your symptoms come back, please go  to the ER for further testing.

## 2023-03-19 NOTE — ED Provider Notes (Signed)
 [facility]    CSN: [id number] Arrival date & time: 03/19/23  0841      History   Chief Complaint Chief Complaint  Patient presents with   Hiccups    Vomiting; larthargic - Entered by patient    HPI [name] is a 83 y.o. male with history of renal cancer,  presents with with daughter and girlfriend, due to onset of hiccups x 2 days but after drinking  large glass of milk last night it resolved. During the initial episode of hiccups onset, which he has had in the past and had to go to ER, he developed pain in his chest my esophagus and provoked him to vomit dark matter which he believed it was black, but no coffee ground matter. His girlfriend said it was brown color. The second episode after a lot of heaving had blood  in it. He has not vomited since yesterday am. And in the evening his daughter brought him some chicken to eat which he ate and caused pain as it went down, but able to keep it down fine. He admits he has been forgetting to take his Prilosec in the past 2 days, but did take it this am, and today his pain is only about 1/10, where it was 10/10. He denies abdominal pain, black stools prior to onset of emesis, but admits constipation since his last BM was 2 days ago. All he ate yesterday was the chicken.     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 06/17/2016   Deficiency of other specified B group vitamins 05/20/2019    Depression 04/04/2019   Diabetes mellitus due to underlying condition with unspecified complications (HCC) 06/28/2019   Diabetes mellitus without complication (HCC)    Type II   Diarrhea 11/21/2018   Dyslipidemia 06/17/2016   Dyspnea on exertion 06/28/2019   Dysrhythmia    Essential (primary) hypertension 06/17/2016   Essential hypertension 06/17/2016   GERD (gastroesophageal reflux disease)    on meds   Greater trochanteric pain syndrome 07/03/2020   Headache    History of hiatal hernia    History of kidney stones    History of prostate cancer 05/20/2019   Hypercholesteremia 04/04/2019   Hyperlipemia    Hypertension    Impaired fasting blood sugar 05/20/2019   Impingement syndrome of shoulder region 01/12/2018   Impingement syndrome of shoulder region 01/12/2018   Low back pain 02/20/2014   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   Lumbar radiculopathy 05/24/2019   Malignant neoplasm prostate (HCC) 05/20/2019   Medicare annual wellness visit, subsequent 01/16/2020   Mixed hyperlipidemia 08/31/2019   Neck pain 07/03/2015   Paresthesia of hand 01/12/2018   Paresthesia of upper limb 01/12/2018   Paroxysmal atrial fibrillation (HCC) 04/04/2019   PONV (postoperative nausea and vomiting)    Pyelonephritis 06/08/2021   S/P cervical spinal fusion 03/04/2018   S/P lumbar fusion 12/09/2019   S/P lumbar spinal fusion  09/29/2013   S/P lumbar spinal fusion 09/29/2013   Sepsis (HCC) 11/22/2018   Shoulder pain 01/12/2018   Spinal 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 01/26/2018   Stenosis of intervertebral foramina 01/12/2018   Trochanteric bursitis of right hip 04/19/2020    Patient Active Problem List   Diagnosis Date Noted   Abscess of neck 07/03/2022   Bladder tumor 02/06/2022   Urge incontinence 09/29/2021   Muscle cramp 09/29/2021   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   Malignant neoplasm of overlapping sites of bladder (HCC) 03/11/2021   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 03/11/2021   Deviated nasal septum 12/25/2020   Hypertrophy of inferior nasal turbinate 12/25/2020   Arthritis    Cataract    Dysrhythmia    GERD (gastroesophageal reflux disease)    History of hiatal hernia    History of kidney stones    PONV (postoperative nausea and vomiting)    S/P lumbar fusion 12/09/2019   Mixed hyperlipidemia 08/31/2019   Angina pectoris (HCC) 08/02/2019   Deficiency of other specified B group vitamins 05/20/2019   Impaired fasting blood sugar 05/20/2019   History of prostate cancer 05/20/2019   Malignant neoplasm prostate (HCC) 05/20/2019   Depression 04/04/2019   Paroxysmal atrial fibrillation (HCC) 04/04/2019   Colitis 11/22/2018   S/P cervical spinal fusion 03/04/2018   Carpal tunnel syndrome 01/18/2018   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 06/17/2016   Cervical spondylosis without myelopathy 09/24/2015    Past Surgical History:  Procedure Laterality Date   anal fissures     ANTERIOR CERVICAL DECOMP/DISCECTOMY FUSION N/A 03/04/2018   Procedure: Anterior Cervical Decompression Fusion - Cervical three-Cervical four - Cervical four-Cervical five;  Surgeon: Joshua Alm RAMAN, MD;  [facility];  Service: Neurosurgery;  Laterality: N/A;   ANTERIOR LAT LUMBAR FUSION N/A 05/30/2020   Procedure: Anterior Lateral Lumbar Fusion Lumbar One-Two with Lateral Plate Removal of pedicle screws Lumbar One and Posterior Lateral Fusion Lumbar One-Lumbar Three;  Surgeon: Joshua Alm RAMAN, MD;  Location: [facility] OR;  Service: Neurosurgery;  Laterality: N/A;   BACK SURGERY     CARDIAC CATHETERIZATION     no PCI   CARPAL TUNNEL RELEASE Left 03/04/2018   Procedure: Carpal Tunnel Release - left;  Surgeon: Joshua Alm RAMAN, MD;  Location: [facility] OR;  Service: Neurosurgery;  Laterality: Left;   CHOLECYSTECTOMY     COLONOSCOPY W/ POLYPECTOMY  08/30/2012   Colonic polyp status post polypectomy.  Pancolonic diverticulosis  predominantly in the sigmoid colon. Small internal hemorrhoids. Moderate internal hemorhroids.    ESOPHAGOGASTRODUODENOSCOPY  08/04/2016   Schatzkis ring status post esophageal dilatation. Small hiatal hernia. Mild gastritis.    EYE SURGERY     cataract removal bilateral eyes   FOOT SURGERY     left heel   HARDWARE REMOVAL N/A 05/30/2020   Procedure: HARDWARE REMOVAL;  Surgeon: Joshua Alm RAMAN, MD;  Location: [facility] OR;  Service: Neurosurgery;  Laterality: N/A;   HERNIA REPAIR     LAMINECTOMY WITH POSTERIOR LATERAL ARTHRODESIS LEVEL 2 N/A 12/09/2019   Procedure: Laminectomy and Foraminotomy - Lumbar one-two, Lumbar two-three, posterolateral instrumented fusion Lumbar one-three, removal of instrumentation Lumbar three-five;  Surgeon: Joshua Alm RAMAN, MD;  Location: [facility] OR;  Service: Neurosurgery;  Laterality: N/A;   LEFT HEART CATH AND CORONARY ANGIOGRAPHY N/A 08/03/2019   Procedure: LEFT HEART CATH AND CORONARY ANGIOGRAPHY;  Surgeon: Dann Candyce RAMAN, MD;  Location: [facility] INVASIVE CV LAB;  Service: Cardiovascular;  Laterality: N/A;   SHOULDER SURGERY     bilateral   WISDOM TOOTH EXTRACTION         Home Medications    Prior to Admission medications   Medication Sig Start Date End Date Taking? Authorizing Provider  aspirin  EC (ASPIRIN  LOW DOSE) 81 MG tablet Take 1 tablet (81 mg total) by mouth daily. Swallow whole. 01/07/22   Cox, Abigail, MD  atorvastatin  (LIPITOR) 40 MG tablet Take 1 tablet (40 mg total) by mouth daily. Please call to the office to make an appointment. 02/09/23   Cox, Abigail, MD  carboxymethylcellulose (REFRESH PLUS) 0.5 % SOLN Place 1 drop into both eyes 3 (three) times daily as needed for dry eyes (dry/irritated eyes).    [provider]  cholecalciferol  (VITAMIN D ) 1000 UNITS tablet Take 1,000 Units by mouth in the morning.    [provider]  COSOPT 2-0.5 % ophthalmic solution 1 drop 2 (two) times daily. 01/28/23   [provider]  icosapent   Ethyl (VASCEPA ) 1 g capsule Take 2 capsules (2 g total) by mouth 2 (two) times daily. 02/26/23   Cox, Abigail, MD  latanoprost (XALATAN) 0.005 % ophthalmic solution 1 drop at bedtime. 12/03/22   [provider]  lisinopril  (ZESTRIL ) 20 MG tablet TAKE 1 TABLET BY MOUTH EVERY DAY 12/10/22   Cox, Kirsten, MD  metFORMIN  (GLUCOPHAGE ) 500 MG tablet TAKE 1 TABLET (500 MG TOTAL) BY MOUTH DAILY. 03/15/23   Cox, Kirsten, MD  nitroGLYCERIN  (NITROSTAT ) 0.4 MG SL tablet Place 1 tablet (0.4 mg total) under the tongue every 5 (five) minutes x 3 doses as needed for chest pain. 06/28/19   Revankar, Jennifer SAUNDERS, MD  omeprazole  (PRILOSEC) 40 MG capsule Take 1 capsule (40 mg total) by mouth daily. 01/28/23   CoxAbigail, MD  tamsulosin  (FLOMAX ) 0.4 MG CAPS capsule Take 1 capsule (0.4 mg total) by mouth daily after supper. 04/24/22   CoxAbigail, MD    Family History Family History  Problem Relation Age of Onset   Colon cancer Neg Hx    Esophageal cancer Neg Hx    Rectal cancer Neg Hx    Stomach cancer Neg Hx    Colon polyps Neg Hx     Social History Social History   Tobacco Use   Smoking status: Former    Current packs/day: 0.00    Average packs/day: 0.5 packs/day for 35.0 years (17.5 ttl pk-yrs)    Types: Cigarettes    Start date: 02/10/1958    Quit date: 02/10/1993    Years since quitting: 30.1   Smokeless tobacco: Former    Types: Chew    Quit date: 02/10/2006  Vaping Use   Vaping status: Never Used  Substance Use Topics   Alcohol  use: Not Currently    Alcohol /week: 1.0 standard drink of alcohol     Types: 1 Cans of beer per week    Comment: occ   Drug use: No     Allergies   Oxycodone    Review of Systems Review of Systems As noted in HPI  Physical Exam Triage Vital Signs ED Triage Vitals  Encounter Vitals Group     BP 03/19/23 0857 (!) 160/85     Systolic BP Percentile --      Diastolic BP Percentile --      Pulse Rate 03/19/23 0857 64     Resp 03/19/23 0857 20     Temp  03/19/23 0857 98.6 F (37 C)     Temp  Source 03/19/23 0857 Oral     SpO2 03/19/23 0857 96 %     Weight --      Height --      Head Circumference --      Peak Flow --      Pain Score 03/19/23 0900 5     Pain Loc --      Pain Education --      Exclude from Growth Chart --    No data found.  Updated Vital Signs BP (!) 160/85 (BP Location: Right Arm)   Pulse 64 Comment: irregular  Temp 98.6 F (37 C) (Oral)   Resp 20   SpO2 96%   Visual Acuity Right Eye Distance:   Left Eye Distance:   Bilateral Distance:    Right Eye Near:   Left Eye Near:    Bilateral Near:     Physical Exam Vitals and nursing note reviewed.  Constitutional:      General: He is not in acute distress.    Appearance: He is not toxic-appearing.  HENT:     Right Ear: External ear normal.     Left Ear: External ear normal.  Eyes:     General: No scleral icterus.    Conjunctiva/sclera: Conjunctivae normal.  Cardiovascular:     Rate and Rhythm: Normal rate and regular rhythm.     Heart sounds: No murmur heard. Pulmonary:     Effort: Pulmonary effort is normal.  Chest:     Chest wall: No tenderness.  Abdominal:     General: There is no distension.     Palpations: Abdomen is soft. There is no mass.     Tenderness: There is no abdominal tenderness. There is no guarding or rebound.  Musculoskeletal:        General: Normal range of motion.     Cervical back: Neck supple.  Skin:    General: Skin is warm and dry.  Neurological:     Mental Status: He is alert and oriented to person, place, and time.     Gait: Gait normal.  Psychiatric:        Mood and Affect: Mood normal.        Behavior: Behavior normal.        Thought Content: Thought content normal.        Judgment: Judgment normal.      UC Treatments / Results  Labs (all labs ordered are listed, but only abnormal results are displayed) Labs Reviewed - No data to display  EKG   Radiology No results found.  Procedures Procedures  (including critical care time)  Medications Ordered in UC Medications - No data to display  Initial Impression / Assessment and Plan / UC Course  I have reviewed the triage vital signs and the nursing notes.  I believe he may have had esophageal irritation and even might have torn a blood vessel from the emesis and may have mild esophagitis  I advised him to stay of soft bland foods, and avoid foods that provokes more GERD for at least a week, and get back on his Prilosec. If symptoms come back he needs to go to ER.      Final Clinical Impressions(s) / UC Diagnoses   Final diagnoses:  Dark emesis  Acute esophagitis     Discharge Instructions      Get back on your Prilosec daily If your symptoms come back, please go  to the ER for further testing.  ED Prescriptions   None    PDMP not reviewed this encounter.   Lindi Carter, PA-C 03/19/23 1019

## 2023-03-19 NOTE — ED Triage Notes (Signed)
 Patient states has had hiccups since Tuesday morning. Developed esophageal pain with the hiccups and even vomitted. States drank a big old glass of milk last night and reports hiccups went away. Have not returned. During his vomiting episode, thought emesis appeared black then during second episode, emesis appeared to have brighter blood in it. No bowel movement since Monday/Tuesday. Patient has hx of afib. 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 Will have left kidney removed March 5th.

## 2023-03-23 DIAGNOSIS — H353132 Nonexudative age-related macular degeneration, bilateral, intermediate dry stage: Secondary | ICD-10-CM | POA: Diagnosis not present

## 2023-03-23 DIAGNOSIS — H43393 Other vitreous opacities, bilateral: Secondary | ICD-10-CM | POA: Diagnosis not present

## 2023-03-23 DIAGNOSIS — H401132 Primary open-angle glaucoma, bilateral, moderate stage: Secondary | ICD-10-CM | POA: Diagnosis not present

## 2023-03-23 DIAGNOSIS — Z961 Presence of intraocular lens: Secondary | ICD-10-CM | POA: Diagnosis not present

## 2023-03-29 NOTE — Progress Notes (Unsigned)
 Subjective:  Patient ID: [name], male    DOB: [birth year]  Age: 83 y.o. MRN: [id number]  Chief Complaint  Patient presents with   Medical Management of Chronic Issues    HPI The patient has bladder and renal cancer, presents with concerns about the progression of his disease. He reports that a flat tumor was found in his renal pelvis, and subsequent scans revealed multiple tumors in his kidney. Scheduled to get a left nephrectomy at the end of February.The patient expresses concern about the delay in surgical intervention and the potential for disease spread. He also reports experiencing pain and hematuria. Dr. Bryson Ha is oncologist and Dr Romana Juniper is urologist. Also Dr. Logan Bores is involved in his care.   In addition to his cancer, the patient has diabetes, which is managed with metformin. He reports a recent urinalysis showed a glucose level of 250, which is significantly higher than his previous results. He denies any changes in his diet or medication regimen that could explain this increase.  The patient also reports feeling tired and having difficulty sleeping. He attributes this to worry about his cancer and frequent nocturia. He denies feeling depressed or sad, but admits to constantly thinking about his cancer.  Diabetes: Taking Metformin 500 mg daily. Last a1c 6.1.    GERD: Taking Omeprazole 40 mg daily.    Hyperlipidemia: Current medications: Atorvastatin 40 mg daily and vascepa 1 gm 2 capsules twice daily.     Hypertension: Current medications: Aspirin 81 mg daily, Lisinopril 20 mg daily.      03/30/2023    7:33 AM 01/15/2023    1:07 PM 08/20/2022    7:27 AM 07/03/2022    1:54 PM 04/24/2022    1:59 PM  Depression screen PHQ 2/9  Decreased Interest 0 0 0 0 0  Down, Depressed, Hopeless 0 0 0 0 0  PHQ - 2 Score 0 0 0 0 0  Altered sleeping  0     Tired, decreased energy  0     Change in appetite  0     Feeling bad or failure about yourself   0     Trouble  concentrating  0     Moving slowly or fidgety/restless  0     Suicidal thoughts  0     PHQ-9 Score  0     Difficult doing work/chores  Not difficult at all           03/30/2023    7:33 AM  Fall Risk   Falls in the past year? 1  Number falls in past yr: 1  Injury with Fall? 0  Risk for fall due to : No Fall Risks    Patient Care Team: Blane Ohara, MD as PCP - General (Family Medicine) Revankar, Aundra Dubin, MD as Consulting Physician (Cardiology) Dawley, Alan Mulder, DO as Consulting Physician Arman Bogus, MD as Consulting Physician (Neurosurgery)   Review of Systems  Constitutional:  Positive for fatigue. Negative for chills and fever.  HENT:  Negative for congestion, ear pain, sinus pressure, sinus pain and sore throat.   Respiratory:  Negative for cough and shortness of breath.   Cardiovascular:  Negative for chest pain.  Gastrointestinal:  Negative for abdominal pain, constipation, diarrhea, nausea and vomiting.  Genitourinary:  Negative for dysuria and frequency.  Musculoskeletal:  Negative for arthralgias, back pain and myalgias.  Neurological:  Negative for dizziness and headaches.  Psychiatric/Behavioral:  Negative for dysphoric mood. The patient is  not nervous/anxious.     Current Outpatient Medications on File Prior to Visit  Medication Sig Dispense Refill   aspirin EC (ASPIRIN LOW DOSE) 81 MG tablet Take 1 tablet (81 mg total) by mouth daily. Swallow whole. 90 tablet 3   atorvastatin (LIPITOR) 40 MG tablet Take 1 tablet (40 mg total) by mouth daily. Please call to the office to make an appointment. 30 tablet 0   carboxymethylcellulose (REFRESH PLUS) 0.5 % SOLN Place 1 drop into both eyes 3 (three) times daily as needed for dry eyes (dry/irritated eyes).     cholecalciferol (VITAMIN D) 1000 UNITS tablet Take 1,000 Units by mouth in the morning.     COSOPT 2-0.5 % ophthalmic solution 1 drop 2 (two) times daily.     icosapent Ethyl (VASCEPA) 1 g capsule Take 2 capsules  (2 g total) by mouth 2 (two) times daily. 360 capsule 1   latanoprost (XALATAN) 0.005 % ophthalmic solution 1 drop at bedtime.     lisinopril (ZESTRIL) 20 MG tablet TAKE 1 TABLET BY MOUTH EVERY DAY 90 tablet 1   metFORMIN (GLUCOPHAGE) 500 MG tablet TAKE 1 TABLET (500 MG TOTAL) BY MOUTH DAILY. 90 tablet 1   nitroGLYCERIN (NITROSTAT) 0.4 MG SL tablet Place 1 tablet (0.4 mg total) under the tongue every 5 (five) minutes x 3 doses as needed for chest pain. 25 tablet 12   omeprazole (PRILOSEC) 40 MG capsule Take 1 capsule (40 mg total) by mouth daily. 90 capsule 1   tamsulosin (FLOMAX) 0.4 MG CAPS capsule Take 1 capsule (0.4 mg total) by mouth daily after supper. 90 capsule 1   No current facility-administered medications on file prior to visit.   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 06/17/2016   Deficiency of other specified B group vitamins 05/20/2019   Depression 04/04/2019   Diabetes mellitus due to underlying condition with unspecified complications (HCC) 06/28/2019   Diabetes mellitus without complication (HCC)    Type II   Diarrhea 11/21/2018   Dyslipidemia 06/17/2016   Dyspnea on exertion 06/28/2019   Dysrhythmia    Essential (primary) hypertension 06/17/2016   Essential hypertension 06/17/2016   GERD (gastroesophageal reflux disease)    on meds   Greater trochanteric pain syndrome 07/03/2020   Headache    History of hiatal hernia    History of kidney stones     History of prostate cancer 05/20/2019   Hypercholesteremia 04/04/2019   Hyperlipemia    Hypertension    Impaired fasting blood sugar 05/20/2019   Impingement syndrome of shoulder region 01/12/2018   Impingement syndrome of shoulder region 01/12/2018   Low back pain 02/20/2014   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   Lumbar radiculopathy 05/24/2019   Malignant neoplasm prostate (HCC) 05/20/2019   Medicare annual wellness visit, subsequent 01/16/2020   Mixed hyperlipidemia 08/31/2019   Neck pain 07/03/2015   Paresthesia of hand 01/12/2018   Paresthesia of upper limb 01/12/2018  Paroxysmal atrial fibrillation (HCC) 04/04/2019   PONV (postoperative nausea and vomiting)    Pyelonephritis 06/08/2021   S/P cervical spinal fusion 03/04/2018   S/P lumbar fusion 12/09/2019   S/P lumbar spinal fusion 09/29/2013   S/P lumbar spinal fusion 09/29/2013   Sepsis (HCC) 11/22/2018   Shoulder pain 01/12/2018   Spinal 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 01/26/2018   Stenosis of intervertebral foramina 01/12/2018   Trochanteric bursitis of right hip 04/19/2020   Past Surgical History:  Procedure Laterality Date   anal fissures     ANTERIOR CERVICAL DECOMP/DISCECTOMY FUSION N/A 03/04/2018   Procedure: Anterior Cervical Decompression Fusion - Cervical three-Cervical four - Cervical four-Cervical five;  Surgeon: Tia Alert, MD;  Location[facility];  Service: Neurosurgery;  Laterality: N/A;   ANTERIOR LAT LUMBAR FUSION N/A 05/30/2020   Procedure: Anterior Lateral Lumbar Fusion Lumbar One-Two with Lateral Plate Removal of pedicle screws Lumbar One and Posterior Lateral Fusion Lumbar One-Lumbar Three;  Surgeon: Tia Alert, MD;  Location: [facility] OR;  Service: Neurosurgery;  Laterality: N/A;   BACK SURGERY     CARDIAC CATHETERIZATION     no PCI   CARPAL TUNNEL RELEASE Left 03/04/2018   Procedure: Carpal Tunnel Release - left;  Surgeon: Tia Alert, MD;  Location: [facility] OR;  Service: Neurosurgery;  Laterality: Left;   CHOLECYSTECTOMY      COLONOSCOPY W/ POLYPECTOMY  08/30/2012   Colonic polyp status post polypectomy.  Pancolonic diverticulosis predominantly in the sigmoid colon. Small internal hemorrhoids. Moderate internal hemorhroids.    ESOPHAGOGASTRODUODENOSCOPY  08/04/2016   Schatzkis ring status post esophageal dilatation. Small hiatal hernia. Mild gastritis.    EYE SURGERY     cataract removal bilateral eyes   FOOT SURGERY     left heel   HARDWARE REMOVAL N/A 05/30/2020   Procedure: HARDWARE REMOVAL;  Surgeon: Tia Alert, MD;  Location: [facility] OR;  Service: Neurosurgery;  Laterality: N/A;   HERNIA REPAIR     LAMINECTOMY WITH POSTERIOR LATERAL ARTHRODESIS LEVEL 2 N/A 12/09/2019   Procedure: Laminectomy and Foraminotomy - Lumbar one-two, Lumbar two-three, posterolateral instrumented fusion Lumbar one-three, removal of instrumentation Lumbar three-five;  Surgeon: Tia Alert, MD;  Location: [facility] OR;  Service: Neurosurgery;  Laterality: N/A;   LEFT HEART CATH AND CORONARY ANGIOGRAPHY N/A 08/03/2019   Procedure: LEFT HEART CATH AND CORONARY ANGIOGRAPHY;  Surgeon: Corky Crafts, MD;  Location: [facility] INVASIVE CV LAB;  Service: Cardiovascular;  Laterality: N/A;   SHOULDER SURGERY     bilateral   WISDOM TOOTH EXTRACTION      Family History  Problem Relation Age of Onset   Colon cancer Neg Hx    Esophageal cancer Neg Hx    Rectal cancer Neg Hx    Stomach cancer Neg Hx    Colon polyps Neg Hx    Social History   Socioeconomic History   Marital status: Widowed    Spouse name: Not on file   Number of children: 2   Years of education: Not on file   Highest education level: GED or equivalent  Occupational History   Not on file  Tobacco Use   Smoking status: Former    Current packs/day: 0.00    Average packs/day: 0.5 packs/day for 35.0 years (17.5 ttl pk-yrs)    Types: Cigarettes    Start date: 02/10/1958    Quit date: 02/10/1993    Years since quitting: 30.1   Smokeless tobacco: Former    Types: Chew    Quit date:  02/10/2006  Vaping Use   Vaping status: Never Used  Substance and Sexual Activity   Alcohol use: Not Currently    Alcohol/week: 1.0 standard drink of alcohol    Types: 1 Cans of beer per week    Comment: occ   Drug use: No   Sexual activity: Yes    Partners: Female  Other Topics Concern   Not on file  Social History Narrative   Not on file   Social Drivers of Health   Financial Resource Strain: Low Risk  (03/07/2023)   Overall Financial Resource Strain (CARDIA)    Difficulty of Paying Living Expenses: Not hard at all  Food Insecurity: No Food Insecurity (03/07/2023)   Hunger Vital Sign    Worried About Running Out of Food in the Last Year: Never true    Ran Out of Food in the Last Year: Never true  Transportation Needs: No Transportation Needs (03/07/2023)   PRAPARE - Administrator, Civil Service (Medical): No    Lack of Transportation (Non-Medical): No  Physical Activity: Inactive (03/07/2023)   Exercise Vital Sign    Days of Exercise per Week: 0 days    Minutes of Exercise per Session: 0 min  Stress: Stress Concern Present (03/07/2023)   [employer] of Occupational Health - Occupational Stress Questionnaire    Feeling of Stress : To some extent  Social Connections: Moderately Integrated (03/07/2023)   Social Connection and Isolation Panel [NHANES]    Frequency of Communication with Friends and Family: More than three times a week    Frequency of Social Gatherings with Friends and Family: More than three times a week    Attends Religious Services: More than 4 times per year    Active Member of [employer] or Organizations: Yes    Attends Banker Meetings: Patient declined    Marital Status: Widowed  Recent Concern: Social Connections - Moderately Isolated (01/15/2023)   Social Connection and Isolation Panel [NHANES]    Frequency of Communication with Friends and Family: More than three times a week    Frequency of Social Gatherings with Friends and  Family: More than three times a week    Attends Religious Services: More than 4 times per year    Active Member of [employer] or Organizations: No    Attends Banker Meetings: Patient declined    Marital Status: Widowed    Objective:  BP 118/82   Pulse 68   Temp 97.7 F (36.5 C)   Ht 5\' 9"  (1.753 m)   Wt 182 lb (82.6 kg)   SpO2 98%   BMI 26.88 kg/m      03/30/2023    7:31 AM 03/19/2023    8:57 AM 01/15/2023    1:04 PM  BP/Weight  Systolic BP 118 160   Diastolic BP 82 85   Wt. (Lbs) 182  185  BMI 26.88 kg/m2  27.32 kg/m2    Physical Exam Vitals reviewed.  Constitutional:      Appearance: Normal appearance.  Neck:     Vascular: No carotid bruit.  Cardiovascular:     Rate and Rhythm: Normal rate and regular rhythm.     Heart sounds: Normal heart sounds.  Pulmonary:     Effort: Pulmonary effort is normal.     Breath sounds: Normal breath sounds. No wheezing, rhonchi or rales.  Abdominal:     General: Bowel sounds are normal.     Palpations: Abdomen is soft.  Tenderness: There is no abdominal tenderness.  Neurological:     Mental Status: He is alert and oriented to person, place, and time.  Psychiatric:        Mood and Affect: Mood normal.        Behavior: Behavior normal.     Diabetic Foot Exam - Simple   Simple Foot Form  03/30/2023  8:07 AM  Visual Inspection See comments: Yes Sensation Testing Intact to touch and monofilament testing bilaterally: Yes Pulse Check Posterior Tibialis and Dorsalis pulse intact bilaterally: Yes Comments Thickened nails.       Lab Results  Component Value Date   WBC 8.2 03/30/2023   HGB 14.8 03/30/2023   HCT 43.9 03/30/2023   PLT 193 03/30/2023   GLUCOSE 106 (H) 03/30/2023   CHOL 103 03/30/2023   TRIG 76 03/30/2023   HDL 40 03/30/2023   LDLCALC 47 03/30/2023   ALT 13 03/30/2023   AST 18 03/30/2023   NA 141 03/30/2023   K 4.6 03/30/2023   CL 103 03/30/2023   CREATININE 0.91 03/30/2023   BUN 15  03/30/2023   CO2 17 (L) 03/30/2023   TSH 2.880 04/25/2022   INR 0.9 05/29/2020   HGBA1C 6.0 (H) 03/30/2023   MICROALBUR 30 07/11/2020      Assessment & Plan:    Hypertensive heart disease with chronic diastolic congestive heart failure (HCC) Assessment & Plan: Well controlled.  No changes to medicines. Aspirin 81 mg daily, Lisinopril 20 mg daily. Continue to work on eating a healthy diet and exercise.  Labs drawn today.   Orders: -     CBC with Differential/Platelet -     Comprehensive metabolic panel  Impaired fasting blood sugar Assessment & Plan: Continue metformin 500 mg daily.  Recommend continue to work on eating healthy diet and exercise.   Orders: -     Hemoglobin A1c -     Microalbumin / creatinine urine ratio  Mixed hyperlipidemia Assessment & Plan: Well controlled.  No changes to medicines. Atorvastatin 40 mg daily, Vascepa 1 gm 2 capsules twice daily.  Continue to work on eating a healthy diet and exercise.  Labs drawn today.   Orders: -     Lipid panel  Gastroesophageal reflux disease without esophagitis Assessment & Plan: Well controlled.  Continue Omeprazole 40 mg daily.   Paroxysmal atrial fibrillation (HCC) Assessment & Plan: Resolved.   Malignant neoplasm of overlapping sites of bladder [facility]) Assessment & Plan: Management per specialist.    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        No orders of the defined types were placed in this encounter.   Orders Placed This Encounter  Procedures   Hemoglobin A1c   Lipid panel   Microalbumin / creatinine urine ratio   CBC with Differential/Platelet   Comprehensive metabolic panel   POCT URINALYSIS DIP (CLINITEK)     Follow-up: Return in about 4 months (around 07/28/2023) for chronic follow up.   I,Marla I Leal-Borjas,acting as a scribe for Blane Ohara,  MD.,have documented all relevant documentation on the behalf of Blane Ohara, MD,as directed by  Blane Ohara, MD while in the presence of Blane Ohara, MD.   An After Visit Summary was printed and given to the patient.  I attest that I have reviewed this visit and agree with the plan scribed by my staff.   Fritzi Mandes  Eisley Barber, MD [facility] [phone]

## 2023-03-30 ENCOUNTER — Encounter: Payer: Self-pay | Admitting: Family Medicine

## 2023-03-30 ENCOUNTER — Ambulatory Visit: Payer: Medicare PPO | Admitting: Family Medicine

## 2023-03-30 VITALS — BP 118/82 | HR 68 | Temp 97.7°F | Ht 69.0 in | Wt 182.0 lb

## 2023-03-30 DIAGNOSIS — K219 Gastro-esophageal reflux disease without esophagitis: Secondary | ICD-10-CM | POA: Diagnosis not present

## 2023-03-30 DIAGNOSIS — I5032 Chronic diastolic (congestive) heart failure: Secondary | ICD-10-CM

## 2023-03-30 DIAGNOSIS — I480 Paroxysmal atrial fibrillation: Secondary | ICD-10-CM | POA: Diagnosis not present

## 2023-03-30 DIAGNOSIS — E782 Mixed hyperlipidemia: Secondary | ICD-10-CM

## 2023-03-30 DIAGNOSIS — C678 Malignant neoplasm of overlapping sites of bladder: Secondary | ICD-10-CM | POA: Diagnosis not present

## 2023-03-30 DIAGNOSIS — C642 Malignant neoplasm of left kidney, except renal pelvis: Secondary | ICD-10-CM | POA: Diagnosis not present

## 2023-03-30 DIAGNOSIS — N3941 Urge incontinence: Secondary | ICD-10-CM

## 2023-03-30 DIAGNOSIS — D630 Anemia in neoplastic disease: Secondary | ICD-10-CM

## 2023-03-30 DIAGNOSIS — R7301 Impaired fasting glucose: Secondary | ICD-10-CM | POA: Diagnosis not present

## 2023-03-30 DIAGNOSIS — I110 Hypertensive heart disease with heart failure: Secondary | ICD-10-CM

## 2023-03-30 LAB — POCT URINALYSIS DIP (CLINITEK)
Bilirubin, UA: NEGATIVE
Blood, UA: NEGATIVE
Glucose, UA: NEGATIVE mg/dL
Ketones, POC UA: NEGATIVE mg/dL
Leukocytes, UA: NEGATIVE
Nitrite, UA: NEGATIVE
POC PROTEIN,UA: NEGATIVE
Spec Grav, UA: 1.015 (ref 1.010–1.025)
Urobilinogen, UA: 0.2 U/dL
pH, UA: 6 (ref 5.0–8.0)

## 2023-03-30 NOTE — Assessment & Plan Note (Signed)
 Check labs

## 2023-03-30 NOTE — Assessment & Plan Note (Signed)
 Management per specialist.

## 2023-03-30 NOTE — Assessment & Plan Note (Signed)
Continue metformin 500 mg daily.  Recommend continue to work on eating healthy diet and exercise.

## 2023-03-30 NOTE — Assessment & Plan Note (Signed)
Well controlled.  Continue Omeprazole 40 mg daily.

## 2023-03-30 NOTE — Assessment & Plan Note (Signed)
Well controlled.  No changes to medicines. Atorvastatin 40 mg daily, Vascepa 1 gm 2 capsules twice daily.  Continue to work on eating a healthy diet and exercise.  Labs drawn today.

## 2023-03-30 NOTE — Assessment & Plan Note (Signed)
 Resolved

## 2023-03-30 NOTE — Assessment & Plan Note (Signed)
Well controlled.  No changes to medicines. Aspirin 81 mg daily, Lisinopril 20 mg daily. Continue to work on eating a healthy diet and exercise.  Labs drawn today.

## 2023-03-31 LAB — CBC WITH DIFFERENTIAL/PLATELET
Basophils Absolute: 0 10*3/uL (ref 0.0–0.2)
Basos: 0 %
EOS (ABSOLUTE): 0.1 10*3/uL (ref 0.0–0.4)
Eos: 1 %
Hematocrit: 43.9 % (ref 37.5–51.0)
Hemoglobin: 14.8 g/dL (ref 13.0–17.7)
Immature Grans (Abs): 0 10*3/uL (ref 0.0–0.1)
Immature Granulocytes: 0 %
Lymphocytes Absolute: 1.2 10*3/uL (ref 0.7–3.1)
Lymphs: 14 %
MCH: 29.9 pg (ref 26.6–33.0)
MCHC: 33.7 g/dL (ref 31.5–35.7)
MCV: 89 fL (ref 79–97)
Monocytes Absolute: 0.6 10*3/uL (ref 0.1–0.9)
Monocytes: 7 %
Neutrophils Absolute: 6.3 10*3/uL (ref 1.4–7.0)
Neutrophils: 78 %
Platelets: 193 10*3/uL (ref 150–450)
RBC: 4.95 x10E6/uL (ref 4.14–5.80)
RDW: 12.6 % (ref 11.6–15.4)
WBC: 8.2 10*3/uL (ref 3.4–10.8)

## 2023-03-31 LAB — HEMOGLOBIN A1C
Est. average glucose Bld gHb Est-mCnc: 126 mg/dL
Hgb A1c MFr Bld: 6 % — ABNORMAL HIGH (ref 4.8–5.6)

## 2023-03-31 LAB — COMPREHENSIVE METABOLIC PANEL
ALT: 13 [IU]/L (ref 0–44)
AST: 18 [IU]/L (ref 0–40)
Albumin: 4.2 g/dL (ref 3.7–4.7)
Alkaline Phosphatase: 110 [IU]/L (ref 44–121)
BUN/Creatinine Ratio: 16 (ref 10–24)
BUN: 15 mg/dL (ref 8–27)
Bilirubin Total: 1.1 mg/dL (ref 0.0–1.2)
CO2: 17 mmol/L — ABNORMAL LOW (ref 20–29)
Calcium: 9.7 mg/dL (ref 8.6–10.2)
Chloride: 103 mmol/L (ref 96–106)
Creatinine, Ser: 0.91 mg/dL (ref 0.76–1.27)
Globulin, Total: 2.9 g/dL (ref 1.5–4.5)
Glucose: 106 mg/dL — ABNORMAL HIGH (ref 70–99)
Potassium: 4.6 mmol/L (ref 3.5–5.2)
Sodium: 141 mmol/L (ref 134–144)
Total Protein: 7.1 g/dL (ref 6.0–8.5)
eGFR: 83 mL/min/{1.73_m2} (ref >59)

## 2023-03-31 LAB — LIPID PANEL
Chol/HDL Ratio: 2.6 {ratio} (ref 0.0–5.0)
Cholesterol, Total: 103 mg/dL (ref 100–199)
HDL: 40 mg/dL (ref >39)
LDL Chol Calc (NIH): 47 mg/dL (ref 0–99)
Triglycerides: 76 mg/dL (ref 0–149)
VLDL Cholesterol Cal: 16 mg/dL (ref 5–40)

## 2023-03-31 LAB — MICROALBUMIN / CREATININE URINE RATIO
Creatinine, Urine: 94.1 mg/dL
Microalb/Creat Ratio: 20 mg/g{creat} (ref 0–29)
Microalbumin, Urine: 18.9 ug/mL

## 2023-04-12 ENCOUNTER — Other Ambulatory Visit: Payer: Self-pay | Admitting: Family Medicine

## 2023-04-12 DIAGNOSIS — I10 Essential (primary) hypertension: Secondary | ICD-10-CM

## 2023-04-15 DIAGNOSIS — C642 Malignant neoplasm of left kidney, except renal pelvis: Secondary | ICD-10-CM | POA: Diagnosis not present

## 2023-04-15 DIAGNOSIS — G500 Trigeminal neuralgia: Secondary | ICD-10-CM | POA: Diagnosis not present

## 2023-04-15 DIAGNOSIS — I480 Paroxysmal atrial fibrillation: Secondary | ICD-10-CM | POA: Diagnosis not present

## 2023-04-15 DIAGNOSIS — K660 Peritoneal adhesions (postprocedural) (postinfection): Secondary | ICD-10-CM | POA: Diagnosis not present

## 2023-04-15 DIAGNOSIS — C662 Malignant neoplasm of left ureter: Secondary | ICD-10-CM | POA: Diagnosis not present

## 2023-04-15 DIAGNOSIS — C652 Malignant neoplasm of left renal pelvis: Secondary | ICD-10-CM | POA: Diagnosis not present

## 2023-04-15 DIAGNOSIS — E7800 Pure hypercholesterolemia, unspecified: Secondary | ICD-10-CM | POA: Diagnosis not present

## 2023-04-15 DIAGNOSIS — D4112 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: Secondary | ICD-10-CM | POA: Diagnosis not present

## 2023-04-15 DIAGNOSIS — K219 Gastro-esophageal reflux disease without esophagitis: Secondary | ICD-10-CM | POA: Diagnosis not present

## 2023-04-15 DIAGNOSIS — Z5111 Encounter for antineoplastic chemotherapy: Secondary | ICD-10-CM | POA: Diagnosis not present

## 2023-04-15 DIAGNOSIS — F32A Depression, unspecified: Secondary | ICD-10-CM | POA: Diagnosis not present

## 2023-04-15 DIAGNOSIS — I2510 Atherosclerotic heart disease of native coronary artery without angina pectoris: Secondary | ICD-10-CM | POA: Diagnosis not present

## 2023-04-15 DIAGNOSIS — I10 Essential (primary) hypertension: Secondary | ICD-10-CM | POA: Diagnosis not present

## 2023-04-15 DIAGNOSIS — E119 Type 2 diabetes mellitus without complications: Secondary | ICD-10-CM | POA: Diagnosis not present

## 2023-04-15 DIAGNOSIS — K429 Umbilical hernia without obstruction or gangrene: Secondary | ICD-10-CM | POA: Diagnosis not present

## 2023-04-20 ENCOUNTER — Telehealth: Payer: Self-pay

## 2023-04-20 NOTE — Transitions of Care (Post Inpatient/ED Visit) (Signed)
   04/20/2023  Name: KVEON CASANAS MRN: [id number] DOB: Jan 17, 1941  Today's TOC FU Call Status: Today's TOC FU Call Status:: Successful TOC FU Call Completed TOC FU Call Complete Date: 04/20/23 Patient's Name and Date of Birth confirmed.  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     Hilbert Odor RN, CCM [place]  VBCI-Population Health RN Care Manager [phone]

## 2023-04-29 ENCOUNTER — Encounter: Payer: Self-pay | Admitting: Physician Assistant

## 2023-04-29 ENCOUNTER — Ambulatory Visit: Admitting: Physician Assistant

## 2023-04-29 ENCOUNTER — Ambulatory Visit: Payer: Self-pay | Admitting: Family Medicine

## 2023-04-29 VITALS — BP 128/62 | HR 85 | Temp 98.5°F | Ht 69.0 in | Wt 177.6 lb

## 2023-04-29 DIAGNOSIS — M722 Plantar fascial fibromatosis: Secondary | ICD-10-CM | POA: Diagnosis not present

## 2023-04-29 MED ORDER — PREDNISONE 10 MG PO TABS
10.0000 mg | ORAL_TABLET | Freq: Every day | ORAL | 0 refills | Status: DC
Start: 2023-04-29 — End: 2023-08-10

## 2023-04-29 NOTE — Patient Instructions (Signed)
 VISIT SUMMARY:  You recently had surgery to remove your left kidney and have now developed swelling and pain in your foot. The pain is particularly bad in the morning and makes it difficult to move your toes. You also feel like you are walking on something thick, which suggests a change in foot sensation.  YOUR PLAN:  -PLANTAR FASCIITIS: Plantar fasciitis is an inflammation of the tissue along the bottom of your foot, often causing heel pain. It may have been worsened by your recent surgery and reduced activity. To manage this, you have been prescribed a low-dose prednisone for 10 days and low-dose ibuprofen (200 mg) for 5 days, every 6-8 hours, with caution due to your kidney status. You can also take Tylenol as needed for pain. Additionally, you should do stretching exercises like drawing the ABCs with your big toe and using a tennis or golf ball to massage the bottom of your foot. Icing your foot with a frozen bottle can help reduce swelling. If your symptoms do not improve in a week, we may refer you to a podiatrist for further evaluation and possible ultrasound-guided steroid injection.  -POSTOPERATIVE CARE: You are recovering from the removal of your left kidney. It is important to be cautious with medications that could affect your remaining kidney. We will monitor your kidney function closely and avoid medications that could harm it. Please follow the surgical recovery guidelines and limit your activity as recommended.  INSTRUCTIONS:  Please follow the medication and exercise recommendations for your foot pain. If your symptoms do not improve in a week, contact us for a possible referral to a podiatrist. Continue to monitor your kidney function and avoid any medications that could harm your remaining kidney. Follow the surgical recovery guidelines and limit your activity as recommended.

## 2023-04-29 NOTE — Progress Notes (Signed)
 Acute Office Visit  Subjective:    Patient ID: [name], male    DOB: 29-Jun-1940, 83 y.o.   MRN: [id number]  Chief Complaint  Patient presents with   Right foot/ankle swelling    HPI: Patient is in today for pain and swelling in the right foot  Discussed the use of AI scribe software for clinical note transcription with the patient, who gave verbal consent to proceed.  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 The symptoms started a few days post-discharge from the hospital. The patient describes the pain as pressure-like, particularly severe in the morning upon first stepping out of bed. The pain is localized around the toes and the ankle, with the patient reporting difficulty in moving the toes due to the discomfort. The patient also reports a sensation of walking on something thick, suggesting a change in foot sensation. There is no history of similar symptoms in the past.       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 06/17/2016   Deficiency of other specified B group vitamins 05/20/2019   Depression 04/04/2019   Diabetes mellitus due to underlying condition with unspecified complications (HCC) 06/28/2019   Diabetes mellitus without complication (HCC)    Type II   Diarrhea 11/21/2018   Dyslipidemia 06/17/2016   Dyspnea  on exertion 06/28/2019   Dysrhythmia    Essential (primary) hypertension 06/17/2016   Essential hypertension 06/17/2016   GERD (gastroesophageal reflux disease)    on meds   Greater trochanteric pain syndrome 07/03/2020   Headache    History of hiatal hernia    History of kidney stones    History of prostate cancer 05/20/2019   Hypercholesteremia 04/04/2019   Hyperlipemia    Hypertension    Impaired fasting blood sugar 05/20/2019   Impingement syndrome of shoulder region 01/12/2018   Impingement syndrome of shoulder region 01/12/2018   Low back pain 02/20/2014   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   Lumbar radiculopathy 05/24/2019   Malignant neoplasm prostate (HCC) 05/20/2019   Medicare annual wellness visit, subsequent 01/16/2020   Mixed hyperlipidemia 08/31/2019   Neck pain 07/03/2015   Paresthesia of hand 01/12/2018   Paresthesia of upper limb 01/12/2018   Paroxysmal atrial fibrillation (HCC) 04/04/2019   PONV (postoperative nausea and vomiting)    Pyelonephritis 06/08/2021   S/P cervical spinal fusion 03/04/2018   S/P lumbar fusion 12/09/2019   S/P lumbar spinal fusion 09/29/2013   S/P lumbar spinal fusion 09/29/2013   Sepsis (HCC) 11/22/2018   Shoulder pain 01/12/2018   Spinal 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 01/26/2018   Stenosis of intervertebral foramina 01/12/2018   Trochanteric bursitis of right hip 04/19/2020    Past Surgical History:  Procedure Laterality Date   anal fissures     ANTERIOR CERVICAL DECOMP/DISCECTOMY FUSION N/A 03/04/2018  Procedure: Anterior Cervical Decompression Fusion - Cervical three-Cervical four - Cervical four-Cervical five;  Surgeon: Tia Alert, MD;  Location: [facility] OR;  Service: Neurosurgery;  Laterality: N/A;   ANTERIOR LAT LUMBAR FUSION N/A 05/30/2020   Procedure: Anterior Lateral Lumbar Fusion Lumbar One-Two with Lateral Plate Removal of pedicle screws Lumbar One and Posterior Lateral Fusion Lumbar One-Lumbar Three;  Surgeon: Tia Alert, MD;  Location: [facility] OR;   Service: Neurosurgery;  Laterality: N/A;   BACK SURGERY     CARDIAC CATHETERIZATION     no PCI   CARPAL TUNNEL RELEASE Left 03/04/2018   Procedure: Carpal Tunnel Release - left;  Surgeon: Tia Alert, MD;  Location: [facility] OR;  Service: Neurosurgery;  Laterality: Left;   CHOLECYSTECTOMY     COLONOSCOPY W/ POLYPECTOMY  08/30/2012   Colonic polyp status post polypectomy.  Pancolonic diverticulosis predominantly in the sigmoid colon. Small internal hemorrhoids. Moderate internal hemorhroids.    ESOPHAGOGASTRODUODENOSCOPY  08/04/2016   Schatzkis ring status post esophageal dilatation. Small hiatal hernia. Mild gastritis.    EYE SURGERY     cataract removal bilateral eyes   FOOT SURGERY     left heel   HARDWARE REMOVAL N/A 05/30/2020   Procedure: HARDWARE REMOVAL;  Surgeon: Tia Alert, MD;  Location: [facility] OR;  Service: Neurosurgery;  Laterality: N/A;   HERNIA REPAIR     LAMINECTOMY WITH POSTERIOR LATERAL ARTHRODESIS LEVEL 2 N/A 12/09/2019   Procedure: Laminectomy and Foraminotomy - Lumbar one-two, Lumbar two-three, posterolateral instrumented fusion Lumbar one-three, removal of instrumentation Lumbar three-five;  Surgeon: Tia Alert, MD;  Location: [facility] OR;  Service: Neurosurgery;  Laterality: N/A;   LEFT HEART CATH AND CORONARY ANGIOGRAPHY N/A 08/03/2019   Procedure: LEFT HEART CATH AND CORONARY ANGIOGRAPHY;  Surgeon: Corky Crafts, MD;  Location: [facility] INVASIVE CV LAB;  Service: Cardiovascular;  Laterality: N/A;   SHOULDER SURGERY     bilateral   WISDOM TOOTH EXTRACTION      Family History  Problem Relation Age of Onset   Colon cancer Neg Hx    Esophageal cancer Neg Hx    Rectal cancer Neg Hx    Stomach cancer Neg Hx    Colon polyps Neg Hx     Social History   Socioeconomic History   Marital status: Widowed    Spouse name: Not on file   Number of children: 2   Years of education: Not on file   Highest education level: GED or equivalent  Occupational History   Not on file   Tobacco Use   Smoking status: Former    Current packs/day: 0.00    Average packs/day: 0.5 packs/day for 35.0 years (17.5 ttl pk-yrs)    Types: Cigarettes    Start date: 02/10/1958    Quit date: 02/10/1993    Years since quitting: 30.2   Smokeless tobacco: Former    Types: Chew    Quit date: 02/10/2006  Vaping Use   Vaping status: Never Used  Substance and Sexual Activity   Alcohol use: Not Currently    Alcohol/week: 1.0 standard drink of alcohol    Types: 1 Cans of beer per week    Comment: occ   Drug use: No   Sexual activity: Yes    Partners: Female  Other Topics Concern   Not on file  Social History Narrative   Not on file   Social Drivers of Health   Financial Resource Strain: Low Risk  (03/07/2023)   Overall Financial Resource Strain (CARDIA)  Difficulty of Paying Living Expenses: Not hard at all  Food Insecurity: Low Risk  (04/15/2023)   Received from [facility]   Hunger Vital Sign    Worried About Running Out of Food in the Last Year: Never true    Ran Out of Food in the Last Year: Never true  Transportation Needs: Unmet Transportation Needs (04/15/2023)   Received from [facility]    In the past 12 months, has lack of reliable transportation kept you from medical appointments, meetings, work or from getting things needed for daily living? : Yes  Physical Activity: Inactive (03/07/2023)   Exercise Vital Sign    Days of Exercise per Week: 0 days    Minutes of Exercise per Session: 0 min  Stress: Stress Concern Present (03/07/2023)   [employer] of Occupational Health - Occupational Stress Questionnaire    Feeling of Stress : To some extent  Social Connections: Moderately Integrated (03/07/2023)   Social Connection and Isolation Panel [NHANES]    Frequency of Communication with Friends and Family: More than three times a week    Frequency of Social Gatherings with Friends and Family: More than three times a week    Attends Religious Services:  More than 4 times per year    Active Member of [employer] or Organizations: Yes    Attends Banker Meetings: Patient declined    Marital Status: Widowed  Recent Concern: Social Connections - Moderately Isolated (01/15/2023)   Social Connection and Isolation Panel [NHANES]    Frequency of Communication with Friends and Family: More than three times a week    Frequency of Social Gatherings with Friends and Family: More than three times a week    Attends Religious Services: More than 4 times per year    Active Member of [employer] or Organizations: No    Attends Banker Meetings: Patient declined    Marital Status: Widowed  Intimate Partner Violence: Not At Risk (01/15/2023)   Humiliation, Afraid, Rape, and Kick questionnaire    Fear of Current or Ex-Partner: No    Emotionally Abused: No    Physically Abused: No    Sexually Abused: No    Outpatient Medications Prior to Visit  Medication Sig Dispense Refill   aspirin EC (ASPIRIN LOW DOSE) 81 MG tablet Take 1 tablet (81 mg total) by mouth daily. Swallow whole. 90 tablet 3   atorvastatin (LIPITOR) 40 MG tablet Take 1 tablet (40 mg total) by mouth daily. Please call to the office to make an appointment. 30 tablet 0   carboxymethylcellulose (REFRESH PLUS) 0.5 % SOLN Place 1 drop into both eyes 3 (three) times daily as needed for dry eyes (dry/irritated eyes).     cholecalciferol (VITAMIN D) 1000 UNITS tablet Take 1,000 Units by mouth in the morning.     COSOPT 2-0.5 % ophthalmic solution 1 drop 2 (two) times daily.     icosapent Ethyl (VASCEPA) 1 g capsule Take 2 capsules (2 g total) by mouth 2 (two) times daily. 360 capsule 1   latanoprost (XALATAN) 0.005 % ophthalmic solution 1 drop at bedtime.     lisinopril (ZESTRIL) 20 MG tablet TAKE 1 TABLET BY MOUTH EVERY DAY 90 tablet 1   metFORMIN (GLUCOPHAGE) 500 MG tablet TAKE 1 TABLET (500 MG TOTAL) BY MOUTH DAILY. 90 tablet 1   nitroGLYCERIN (NITROSTAT) 0.4 MG SL tablet Place 1  tablet (0.4 mg total) under the tongue every 5 (five) minutes x 3 doses as needed  for chest pain. 25 tablet 12   omeprazole (PRILOSEC) 40 MG capsule Take 1 capsule (40 mg total) by mouth daily. 90 capsule 1   tamsulosin (FLOMAX) 0.4 MG CAPS capsule Take 1 capsule (0.4 mg total) by mouth daily after supper. 90 capsule 1   No facility-administered medications prior to visit.    Allergies  Allergen Reactions   Oxycodone Other (See Comments)    loopy "loopy"    Review of Systems  Constitutional:  Negative for appetite change, fatigue and fever.  HENT:  Negative for congestion, ear pain, sinus pressure and sore throat.   Respiratory:  Negative for cough, chest tightness, shortness of breath and wheezing.   Cardiovascular:  Negative for chest pain and palpitations.  Gastrointestinal:  Negative for abdominal pain, constipation, diarrhea, nausea and vomiting.  Genitourinary:  Negative for dysuria and hematuria.  Musculoskeletal:  Positive for myalgias (Right foot/ankle swelling). Negative for arthralgias, back pain and joint swelling.  Skin:  Negative for rash.  Neurological:  Negative for dizziness, weakness and headaches.  Psychiatric/Behavioral:  Negative for dysphoric mood. The patient is not nervous/anxious.        Objective:        04/29/2023    1:17 PM 03/30/2023    7:31 AM 03/19/2023    8:57 AM  Vitals with BMI  Height 5\' 9"  5\' 9"    Weight 177 lbs 10 oz 182 lbs   BMI 26.22 26.86   Systolic 128 118 253  Diastolic 62 82 85  Pulse 85 68 64    Orthostatic VS for the past 72 hrs (Last 3 readings):  Patient Position BP Location  04/29/23 1317 Sitting Left Arm     Physical Exam Vitals reviewed.  Constitutional:      Appearance: Normal appearance.  Cardiovascular:     Rate and Rhythm: Normal rate and regular rhythm.     Heart sounds: Normal heart sounds.  Pulmonary:     Effort: Pulmonary effort is normal.     Breath sounds: Normal breath sounds.  Abdominal:      General: Bowel sounds are normal.     Palpations: Abdomen is soft.     Tenderness: There is no abdominal tenderness.  Musculoskeletal:     Right lower leg: No swelling or tenderness. No edema.     Left lower leg: No swelling or tenderness. No edema.     Right foot: Decreased range of motion. Swelling and tenderness present.     Left foot: Normal.     Comments: TTP at the heel on the planter surface just posterior to the arch of the foot.   Neurological:     Mental Status: He is alert and oriented to person, place, and time.  Psychiatric:        Mood and Affect: Mood normal.        Behavior: Behavior normal.     Health Maintenance Due  Topic Date Due   OPHTHALMOLOGY EXAM  Never done   COVID-19 Vaccine (5 - 2024-25 season) 10/12/2022    There are no preventive care reminders to display for this patient.   Lab Results  Component Value Date   TSH 2.880 04/25/2022   Lab Results  Component Value Date   WBC 8.2 03/30/2023   HGB 14.8 03/30/2023   HCT 43.9 03/30/2023   MCV 89 03/30/2023   PLT 193 03/30/2023   Lab Results  Component Value Date   NA 141 03/30/2023   K 4.6 03/30/2023   CO2 17 (  L) 03/30/2023   GLUCOSE 106 (H) 03/30/2023   BUN 15 03/30/2023   CREATININE 0.91 03/30/2023   BILITOT 1.1 03/30/2023   ALKPHOS 110 03/30/2023   AST 18 03/30/2023   ALT 13 03/30/2023   PROT 7.1 03/30/2023   ALBUMIN 4.2 03/30/2023   CALCIUM 9.7 03/30/2023   ANIONGAP 10 05/29/2020   EGFR 83 03/30/2023   Lab Results  Component Value Date   CHOL 103 03/30/2023   Lab Results  Component Value Date   HDL 40 03/30/2023   Lab Results  Component Value Date   LDLCALC 47 03/30/2023   Lab Results  Component Value Date   TRIG 76 03/30/2023   Lab Results  Component Value Date   CHOLHDL 2.6 03/30/2023   Lab Results  Component Value Date   HGBA1C 6.0 (H) 03/30/2023       Assessment & Plan:  Plantar fasciitis of right foot Assessment & Plan: Suspected plantar fasciitis due  to pain location and history, likely exacerbated by recent surgery and reduced activity. Treatment risks include potential kidney irritation from medications. - Prescribe low-dose prednisone for 10 days, considering kidney function. - Recommend low-dose ibuprofen (200 mg) for 5 days, every 6-8 hours, with caution due to kidney status. - Suggest Tylenol as needed for pain management. - Advise stretching exercises, such as drawing the ABCs with the big toe and using a tennis or golf ball for fascia release. - Recommend icing the foot using a frozen bottle to reduce swelling. - Consider referral to a podiatrist for further evaluation and possible ultrasound-guided steroid injection if symptoms do not improve in a week.  Orders: -     predniSONE; Take 1 tablet (10 mg total) by mouth daily with breakfast.  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 Cautious about medications affecting the remaining kidney. Discussed potential for kidney irritation and need to monitor function closely. - Monitor kidney function and avoid nephrotoxic medications. - Encourage limited activity as per surgical recovery guidelines.          Meds ordered this encounter  Medications   predniSONE (DELTASONE) 10 MG tablet    Sig: Take 1 tablet (10 mg total) by mouth daily with breakfast.    Dispense:  10 tablet    Refill:  0    No orders of the defined types were placed in this encounter.    Follow-up: Return if symptoms worsen or fail to improve.  An After Visit Summary was printed and given to the patient.   I,Lauren M Auman,acting as a Neurosurgeon for [employer], [employer].,have documented all relevant documentation on the behalf of Langley Gauss, PA,as directed by  Langley Gauss, PA while in the presence of Langley Gauss, [place].    Langley Gauss, [place] [facility] [phone]

## 2023-04-29 NOTE — Telephone Encounter (Signed)
 Chief Complaint: right foot pain and swelling Symptoms: right foot redness, warmth and swelling, right foot/ankle pain Frequency: x 1 week Pertinent Negatives: Patient denies fever, history of DVT, difficulty breathing, chest pain Disposition: [] ED /[] Urgent Care (no appt availability in office) / [x] Appointment(In office/virtual)/ []  [place] [facility]/ [] Home Care/ [] Refused Recommended Disposition /[] [facility]/ []  Follow-up with PCP Additional Notes: Patient had nephrectomy on 3/5 and for a week has had swelling and pain in right foot. Significant other states they called nephrologist yesterday, heard back today to call PCP for symptoms and they do not think this is related to his surgery. They suggested to see his PCP to rule out a DVT. Patient and SO agreeable to acute visit today with PA Langley Gauss. Verbalize understanding to call rescue for emergent symtpoms.  Copied from CRM [id number]. Topic: Clinical - Red Word Triage >> Apr 29, 2023 11:03 AM Gaetano Hawthorne wrote: Red Word that prompted transfer to Nurse Triage: patient recently got his left kidney out on 03/05 and some staples out last Friday - his right foot has been swollen for about a week now and patient states that it is painful when he steps on it. Reason for Disposition  [1] Thigh, calf, or ankle swelling AND [2] only 1 side  Answer Assessment - Initial Assessment Questions 1. ONSET: "When did the swelling start?" (e.g., minutes, hours, days)     X 1 week.  2. LOCATION: "What part of the leg is swollen?"  "Are both legs swollen or just one leg?"     Right foot.  3. SEVERITY: "How bad is the swelling?" (e.g., localized; mild, moderate, severe)   - Localized: Small area of swelling localized to one leg.   - MILD pedal edema: Swelling limited to foot and ankle, pitting edema < 1/4 inch (6 mm) deep, rest and elevation eliminate most or all swelling.   - MODERATE edema: Swelling of lower leg to knee, pitting edema > 1/4  inch (6 mm) deep, rest and elevation only partially reduce swelling.   - SEVERE edema: Swelling extends above knee, facial or hand swelling present.      Localized, worse in the morning.  4. REDNESS: "Does the swelling look red or infected?"     Redness.  5. PAIN: "Is the swelling painful to touch?" If Yes, ask: "How painful is it?"   (Scale 1-10; mild, moderate or severe)     6/10 right foot and ankle. Denies taking any pain medication.  6. FEVER: "Do you have a fever?" If Yes, ask: "What is it, how was it measured, and when did it start?"      Denies.  7. CAUSE: "What do you think is causing the leg swelling?"     Unsure, but nephrologist suggest calling PCP to rule out a DVT.  8. MEDICAL HISTORY: "Do you have a history of blood clots (e.g., DVT), cancer, heart failure, kidney disease, or liver failure?"     Kidney disease/bladder cancer.  9. RECURRENT SYMPTOM: "Have you had leg swelling before?" If Yes, ask: "When was the last time?" "What happened that time?"     Denies.  10. OTHER SYMPTOMS: "Do you have any other symptoms?" (e.g., chest pain, difficulty breathing)       Right foot feels hot compared to left,   11. PREGNANCY: "Is there any chance you are pregnant?" "When was your last menstrual period?"       N/A.  Protocols used: Leg Swelling and Edema-A-AH

## 2023-04-29 NOTE — Assessment & Plan Note (Signed)
 Suspected plantar fasciitis due to pain location and history, likely exacerbated by recent surgery and reduced activity. Treatment risks include potential kidney irritation from medications. - Prescribe low-dose prednisone for 10 days, considering kidney function. - Recommend low-dose ibuprofen (200 mg) for 5 days, every 6-8 hours, with caution due to kidney status. - Suggest Tylenol as needed for pain management. - Advise stretching exercises, such as drawing the ABCs with the big toe and using a tennis or golf ball for fascia release. - Recommend icing the foot using a frozen bottle to reduce swelling. - Consider referral to a podiatrist for further evaluation and possible ultrasound-guided steroid injection if symptoms do not improve in a week.

## 2023-05-06 DIAGNOSIS — R399 Unspecified symptoms and signs involving the genitourinary system: Secondary | ICD-10-CM | POA: Diagnosis not present

## 2023-05-07 ENCOUNTER — Other Ambulatory Visit: Payer: Self-pay | Admitting: Family Medicine

## 2023-05-11 DIAGNOSIS — C642 Malignant neoplasm of left kidney, except renal pelvis: Secondary | ICD-10-CM | POA: Diagnosis not present

## 2023-05-11 DIAGNOSIS — C678 Malignant neoplasm of overlapping sites of bladder: Secondary | ICD-10-CM | POA: Diagnosis not present

## 2023-05-13 DIAGNOSIS — C679 Malignant neoplasm of bladder, unspecified: Secondary | ICD-10-CM | POA: Diagnosis not present

## 2023-05-13 DIAGNOSIS — C642 Malignant neoplasm of left kidney, except renal pelvis: Secondary | ICD-10-CM | POA: Diagnosis not present

## 2023-05-19 DIAGNOSIS — C679 Malignant neoplasm of bladder, unspecified: Secondary | ICD-10-CM | POA: Diagnosis not present

## 2023-05-19 DIAGNOSIS — J439 Emphysema, unspecified: Secondary | ICD-10-CM | POA: Diagnosis not present

## 2023-05-19 DIAGNOSIS — I700 Atherosclerosis of aorta: Secondary | ICD-10-CM | POA: Diagnosis not present

## 2023-05-19 DIAGNOSIS — K769 Liver disease, unspecified: Secondary | ICD-10-CM | POA: Diagnosis not present

## 2023-05-19 DIAGNOSIS — I2510 Atherosclerotic heart disease of native coronary artery without angina pectoris: Secondary | ICD-10-CM | POA: Diagnosis not present

## 2023-05-19 DIAGNOSIS — Z905 Acquired absence of kidney: Secondary | ICD-10-CM | POA: Diagnosis not present

## 2023-05-21 DIAGNOSIS — C679 Malignant neoplasm of bladder, unspecified: Secondary | ICD-10-CM | POA: Diagnosis not present

## 2023-05-21 DIAGNOSIS — C642 Malignant neoplasm of left kidney, except renal pelvis: Secondary | ICD-10-CM | POA: Diagnosis not present

## 2023-05-21 DIAGNOSIS — C678 Malignant neoplasm of overlapping sites of bladder: Secondary | ICD-10-CM | POA: Diagnosis not present

## 2023-05-21 DIAGNOSIS — Z79899 Other long term (current) drug therapy: Secondary | ICD-10-CM | POA: Diagnosis not present

## 2023-05-21 DIAGNOSIS — Z5112 Encounter for antineoplastic immunotherapy: Secondary | ICD-10-CM | POA: Diagnosis not present

## 2023-05-28 DIAGNOSIS — C642 Malignant neoplasm of left kidney, except renal pelvis: Secondary | ICD-10-CM | POA: Diagnosis not present

## 2023-06-05 DIAGNOSIS — C642 Malignant neoplasm of left kidney, except renal pelvis: Secondary | ICD-10-CM | POA: Diagnosis not present

## 2023-06-05 DIAGNOSIS — Z5112 Encounter for antineoplastic immunotherapy: Secondary | ICD-10-CM | POA: Diagnosis not present

## 2023-06-05 DIAGNOSIS — C678 Malignant neoplasm of overlapping sites of bladder: Secondary | ICD-10-CM | POA: Diagnosis not present

## 2023-06-05 DIAGNOSIS — Z79899 Other long term (current) drug therapy: Secondary | ICD-10-CM | POA: Diagnosis not present

## 2023-06-19 DIAGNOSIS — C678 Malignant neoplasm of overlapping sites of bladder: Secondary | ICD-10-CM | POA: Diagnosis not present

## 2023-06-19 DIAGNOSIS — C642 Malignant neoplasm of left kidney, except renal pelvis: Secondary | ICD-10-CM | POA: Diagnosis not present

## 2023-06-19 DIAGNOSIS — Z5112 Encounter for antineoplastic immunotherapy: Secondary | ICD-10-CM | POA: Diagnosis not present

## 2023-06-19 DIAGNOSIS — Z79899 Other long term (current) drug therapy: Secondary | ICD-10-CM | POA: Diagnosis not present

## 2023-07-03 DIAGNOSIS — C678 Malignant neoplasm of overlapping sites of bladder: Secondary | ICD-10-CM | POA: Diagnosis not present

## 2023-07-03 DIAGNOSIS — Z5112 Encounter for antineoplastic immunotherapy: Secondary | ICD-10-CM | POA: Diagnosis not present

## 2023-07-03 DIAGNOSIS — C642 Malignant neoplasm of left kidney, except renal pelvis: Secondary | ICD-10-CM | POA: Diagnosis not present

## 2023-07-03 DIAGNOSIS — Z79899 Other long term (current) drug therapy: Secondary | ICD-10-CM | POA: Diagnosis not present

## 2023-07-07 DIAGNOSIS — R21 Rash and other nonspecific skin eruption: Secondary | ICD-10-CM | POA: Diagnosis not present

## 2023-07-07 DIAGNOSIS — C679 Malignant neoplasm of bladder, unspecified: Secondary | ICD-10-CM | POA: Diagnosis not present

## 2023-07-15 ENCOUNTER — Other Ambulatory Visit: Payer: Self-pay | Admitting: Family Medicine

## 2023-07-15 DIAGNOSIS — K219 Gastro-esophageal reflux disease without esophagitis: Secondary | ICD-10-CM

## 2023-07-23 DIAGNOSIS — C679 Malignant neoplasm of bladder, unspecified: Secondary | ICD-10-CM | POA: Diagnosis not present

## 2023-07-23 DIAGNOSIS — C678 Malignant neoplasm of overlapping sites of bladder: Secondary | ICD-10-CM | POA: Diagnosis not present

## 2023-07-23 DIAGNOSIS — R21 Rash and other nonspecific skin eruption: Secondary | ICD-10-CM | POA: Diagnosis not present

## 2023-07-23 DIAGNOSIS — Z5112 Encounter for antineoplastic immunotherapy: Secondary | ICD-10-CM | POA: Diagnosis not present

## 2023-07-23 DIAGNOSIS — C642 Malignant neoplasm of left kidney, except renal pelvis: Secondary | ICD-10-CM | POA: Diagnosis not present

## 2023-07-28 NOTE — Progress Notes (Signed)
 Subjective:  Patient ID: [name], male    DOB: July 27, 1940  Age: 83 y.o. MRN: [id number]  Chief Complaint  Patient presents with   Medical Management of Chronic Issues    HPI: Patient is an 83 year old white male with past medical history significant for diabetes, hypertension, hyperlipidemia, renal cancer s/p nephrectomy and a distant history of atrial fibrillation in 2014 who presented for chronic follow-up and I noted on exam that he seemed to be bradycardic.  EKG that showed frequent PACs versus questionable block.  He progressively worsened during his visit developing dizziness, lightheadedness and feeling malaise.  Patient reported he has felt this over the last few days.  He denies chest pain or shortness of breath.  Orthostatics done later in the appointment showed that his blood pressure actually gone up into the 180s/80s.  His heart rates in all the 40s.  Diabetes: Taking Metformin  500 mg daily. Does not check sugars. Does not know how to check sugars. Sugar 109. Repeat 98 30 minutes later.  Last a1c 6.0%   GERD: Taking Omeprazole  40 mg daily.    Hyperlipidemia: Current medications: Atorvastatin  40 mg daily and vascepa  1 gm 2 capsules twice daily.     Hypertension: Current medications: Aspirin  81 mg daily, Lisinopril  20 mg daily.   Left nephrectomy and part of bladder removed. After surgery he had ct scan and found to have 3 lesions on liver and now receiving optiva infusions every 2 weeks until next month and then will have another scan.  Caused a rash. They are treating rash with prednisone  and cream. His oncologist is Thurman Flores, MD.  Patient is also complaining of right shoulder pain that came on last night.  He denied any discomfort with range of motion.       07/29/2023    9:26 AM 03/30/2023    7:33 AM 01/15/2023    1:07 PM 08/20/2022    7:27 AM 07/03/2022    1:54 PM  Depression screen PHQ 2/9  Decreased Interest 0 0 0 0 0  Down, Depressed, Hopeless 0  0 0 0 0  PHQ - 2 Score 0 0 0 0 0  Altered sleeping   0    Tired, decreased energy   0    Change in appetite   0    Feeling bad or failure about yourself    0    Trouble concentrating   0    Moving slowly or fidgety/restless   0    Suicidal thoughts   0    PHQ-9 Score   0    Difficult doing work/chores   Not difficult at all          03/30/2023    7:33 AM  Fall Risk   Falls in the past year? 1  Number falls in past yr: 1  Injury with Fall? 0  Risk for fall due to : No Fall Risks    Patient Care Team: Mercy Stall, MD as PCP - General (Family Medicine) Revankar, Micael Adas, MD as Consulting Physician (Cardiology) Dawley, Colby Daub, DO as Consulting Physician Joaquin Mulberry, MD as Consulting Physician (Neurosurgery)   Review of Systems  Constitutional:  Positive for fatigue. Negative for chills, diaphoresis and fever.  HENT:  Negative for congestion, ear pain and sore throat.   Respiratory:  Negative for cough and shortness of breath.   Cardiovascular:  Negative for chest pain and leg swelling.  Gastrointestinal:  Negative for abdominal pain, constipation, diarrhea,  nausea and vomiting.  Genitourinary:  Negative for dysuria and urgency.  Musculoskeletal:  Negative for arthralgias and myalgias.  Neurological:  Positive for dizziness and light-headedness. Negative for headaches.  Psychiatric/Behavioral:  Negative for dysphoric mood.     Current Outpatient Medications on File Prior to Visit  Medication Sig Dispense Refill   aspirin  EC (ASPIRIN  LOW DOSE) 81 MG tablet Take 1 tablet (81 mg total) by mouth daily. Swallow whole. 90 tablet 3   atorvastatin  (LIPITOR) 40 MG tablet TAKE 1 TABLET BY MOUTH EVERY DAY 90 tablet 1   carboxymethylcellulose (REFRESH PLUS) 0.5 % SOLN Place 1 drop into both eyes 3 (three) times daily as needed for dry eyes (dry/irritated eyes).     cholecalciferol  (VITAMIN D ) 1000 UNITS tablet Take 1,000 Units by mouth in the morning.     COSOPT 2-0.5 % ophthalmic  solution 1 drop 2 (two) times daily.     icosapent  Ethyl (VASCEPA ) 1 g capsule Take 2 capsules (2 g total) by mouth 2 (two) times daily. 360 capsule 1   latanoprost (XALATAN) 0.005 % ophthalmic solution 1 drop at bedtime.     lisinopril  (ZESTRIL ) 20 MG tablet TAKE 1 TABLET BY MOUTH EVERY DAY 90 tablet 1   metFORMIN  (GLUCOPHAGE ) 500 MG tablet TAKE 1 TABLET (500 MG TOTAL) BY MOUTH DAILY. 90 tablet 1   nitroGLYCERIN  (NITROSTAT ) 0.4 MG SL tablet Place 1 tablet (0.4 mg total) under the tongue every 5 (five) minutes x 3 doses as needed for chest pain. 25 tablet 12   omeprazole  (PRILOSEC) 40 MG capsule TAKE 1 CAPSULE (40 MG TOTAL) BY MOUTH DAILY. 90 capsule 1   predniSONE  (DELTASONE ) 10 MG tablet Take 1 tablet (10 mg total) by mouth daily with breakfast. 10 tablet 0   tamsulosin  (FLOMAX ) 0.4 MG CAPS capsule Take 1 capsule (0.4 mg total) by mouth daily after supper. 90 capsule 1   No current facility-administered medications on file prior to visit.   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 06/17/2016   Deficiency of other specified B group vitamins 05/20/2019   Depression 04/04/2019   Diabetes mellitus due to underlying condition with unspecified complications (HCC) 06/28/2019   Diabetes mellitus without complication (HCC)    Type II   Diarrhea 11/21/2018   Dyslipidemia 06/17/2016   Dyspnea on exertion 06/28/2019   Dysrhythmia    Essential (primary) hypertension 06/17/2016    Essential hypertension 06/17/2016   GERD (gastroesophageal reflux disease)    on meds   Greater trochanteric pain syndrome 07/03/2020   Headache    History of hiatal hernia    History of kidney stones    History of prostate cancer 05/20/2019   Hypercholesteremia 04/04/2019   Hyperlipemia    Hypertension    Impaired fasting blood sugar 05/20/2019   Impingement syndrome of shoulder region 01/12/2018   Impingement syndrome of shoulder region 01/12/2018   Low back pain 02/20/2014   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  Lumbar radiculopathy 05/24/2019   Malignant neoplasm prostate (HCC) 05/20/2019   Medicare annual wellness visit, subsequent 01/16/2020   Mixed hyperlipidemia 08/31/2019   Neck pain 07/03/2015   Paresthesia of hand 01/12/2018   Paresthesia of upper limb 01/12/2018   Paroxysmal atrial fibrillation (HCC) 04/04/2019   PONV (postoperative nausea and vomiting)    Pyelonephritis 06/08/2021   S/P cervical spinal fusion 03/04/2018   S/P lumbar fusion 12/09/2019   S/P lumbar spinal fusion 09/29/2013   S/P lumbar spinal fusion 09/29/2013   Sepsis (HCC) 11/22/2018   Shoulder pain 01/12/2018   Spinal 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 01/26/2018   Stenosis of intervertebral foramina 01/12/2018   Trochanteric bursitis of right hip 04/19/2020   Past Surgical History:  Procedure Laterality Date   anal fissures     ANTERIOR CERVICAL DECOMP/DISCECTOMY FUSION N/A 03/04/2018   Procedure: Anterior Cervical Decompression Fusion - Cervical three-Cervical four - Cervical four-Cervical five;  Surgeon: Isadora Mar, MD;  Location: [facility] OR;  Service: Neurosurgery;  Laterality: N/A;   ANTERIOR LAT LUMBAR FUSION N/A 05/30/2020   Procedure: Anterior Lateral Lumbar Fusion Lumbar One-Two with Lateral Plate Removal of pedicle screws Lumbar One and Posterior Lateral Fusion Lumbar One-Lumbar Three;  Surgeon: Isadora Mar, MD;  Location: [facility] OR;  Service: Neurosurgery;  Laterality: N/A;   BACK SURGERY     CARDIAC CATHETERIZATION      no PCI   CARPAL TUNNEL RELEASE Left 03/04/2018   Procedure: Carpal Tunnel Release - left;  Surgeon: Isadora Mar, MD;  Location: [facility] OR;  Service: Neurosurgery;  Laterality: Left;   CHOLECYSTECTOMY     COLONOSCOPY W/ POLYPECTOMY  08/30/2012   Colonic polyp status post polypectomy.  Pancolonic diverticulosis predominantly in the sigmoid colon. Small internal hemorrhoids. Moderate internal hemorhroids.    ESOPHAGOGASTRODUODENOSCOPY  08/04/2016   Schatzkis ring status post esophageal dilatation. Small hiatal hernia. Mild gastritis.    EYE SURGERY     cataract removal bilateral eyes   FOOT SURGERY     left heel   HARDWARE REMOVAL N/A 05/30/2020   Procedure: HARDWARE REMOVAL;  Surgeon: Isadora Mar, MD;  Location: [facility] OR;  Service: Neurosurgery;  Laterality: N/A;   HERNIA REPAIR     LAMINECTOMY WITH POSTERIOR LATERAL ARTHRODESIS LEVEL 2 N/A 12/09/2019   Procedure: Laminectomy and Foraminotomy - Lumbar one-two, Lumbar two-three, posterolateral instrumented fusion Lumbar one-three, removal of instrumentation Lumbar three-five;  Surgeon: Isadora Mar, MD;  Location: [facility] OR;  Service: Neurosurgery;  Laterality: N/A;   LEFT HEART CATH AND CORONARY ANGIOGRAPHY N/A 08/03/2019   Procedure: LEFT HEART CATH AND CORONARY ANGIOGRAPHY;  Surgeon: Lucendia Rusk, MD;  Location: [facility] INVASIVE CV LAB;  Service: Cardiovascular;  Laterality: N/A;   SHOULDER SURGERY     bilateral   WISDOM TOOTH EXTRACTION      Family History  Problem Relation Age of Onset   Colon cancer Neg Hx    Esophageal cancer Neg Hx    Rectal cancer Neg Hx    Stomach cancer Neg Hx    Colon polyps Neg Hx    Social History   Socioeconomic History   Marital status: Widowed    Spouse name: Not on file   Number of children: 2   Years of education: Not on file   Highest education level: GED or equivalent  Occupational History   Not on file  Tobacco Use   Smoking status: Former    Current packs/day: 0.00    Average  packs/day: 0.5 packs/day for  35.0 years (17.5 ttl pk-yrs)    Types: Cigarettes    Start date: 02/10/1958    Quit date: 02/10/1993    Years since quitting: 30.4   Smokeless tobacco: Former    Types: Chew    Quit date: 02/10/2006  Vaping Use   Vaping status: Never Used  Substance and Sexual Activity   Alcohol  use: Not Currently    Alcohol /week: 1.0 standard drink of alcohol     Types: 1 Cans of beer per week    Comment: occ   Drug use: No   Sexual activity: Yes    Partners: Female  Other Topics Concern   Not on file  Social History Narrative   Not on file   Social Drivers of Health   Financial Resource Strain: Low Risk  (03/07/2023)   Overall Financial Resource Strain (CARDIA)    Difficulty of Paying Living Expenses: Not hard at all  Food Insecurity: Low Risk  (04/15/2023)   Received from [facility]   Hunger Vital Sign    Within the past 12 months, you worried that your food would run out before you got money to buy more: Never true    Within the past 12 months, the food you bought just didn't last and you didn't have money to get more. : Never true  Transportation Needs: Unmet Transportation Needs (04/15/2023)   Received from [facility]    In the past 12 months, has lack of reliable transportation kept you from medical appointments, meetings, work or from getting things needed for daily living? : Yes  Physical Activity: Inactive (03/07/2023)   Exercise Vital Sign    Days of Exercise per Week: 0 days    Minutes of Exercise per Session: 0 min  Stress: Stress Concern Present (03/07/2023)   [employer] of Occupational Health - Occupational Stress Questionnaire    Feeling of Stress : To some extent  Social Connections: Moderately Integrated (03/07/2023)   Social Connection and Isolation Panel    Frequency of Communication with Friends and Family: More than three times a week    Frequency of Social Gatherings with Friends and Family: More than three times a week     Attends Religious Services: More than 4 times per year    Active Member of [employer] or Organizations: Yes    Attends Banker Meetings: Patient declined    Marital Status: Widowed  Recent Concern: Social Connections - Moderately Isolated (01/15/2023)   Social Connection and Isolation Panel    Frequency of Communication with Friends and Family: More than three times a week    Frequency of Social Gatherings with Friends and Family: More than three times a week    Attends Religious Services: More than 4 times per year    Active Member of [employer] or Organizations: No    Attends Banker Meetings: Patient declined    Marital Status: Widowed    Objective:  BP 128/74   Pulse 85   Temp 98.2 F (36.8 C)   Ht 5' 9 (1.753 m)   Wt 182 lb (82.6 kg)   SpO2 98%   BMI 26.88 kg/m      07/29/2023    7:34 AM 04/29/2023    1:17 PM 03/30/2023    7:31 AM  BP/Weight  Systolic BP 128 128 118  Diastolic BP 74 62 82  Wt. (Lbs) 182 177.6 182  BMI 26.88 kg/m2 26.23 kg/m2 26.88 kg/m2   Orthostatic VS for the past 72  hrs (Last 3 readings):  Orthostatic BP Orthostatic Pulse  07/29/23 0918 180/80 50  07/29/23 0917 182/80 (!) 44  07/29/23 0914 (!) 170/100 52    Physical Exam Vitals reviewed.  Constitutional:      Appearance: Normal appearance.  Neck:     Vascular: No carotid bruit.   Cardiovascular:     Rate and Rhythm: Bradycardia present. Rhythm irregular.     Pulses: Normal pulses.     Heart sounds: Normal heart sounds.  Pulmonary:     Effort: Pulmonary effort is normal.     Breath sounds: Normal breath sounds. No wheezing, rhonchi or rales.  Abdominal:     General: Bowel sounds are normal.     Palpations: Abdomen is soft.     Tenderness: There is no abdominal tenderness.   Musculoskeletal:        General: Tenderness (over anterior right shoulder.) present. Normal range of motion.   Neurological:     Mental Status: He is alert and oriented to person, place, and  time.     Comments: MMSE 28/30.   Psychiatric:        Mood and Affect: Mood normal.        Behavior: Behavior normal.      Diabetic foot exam was performed with the following findings:   Normal sensation of 10g monofilament Intact posterior tibialis and dorsalis pedis pulses Patient has bunions on both feet.      Lab Results  Component Value Date   WBC 8.2 03/30/2023   HGB 14.8 03/30/2023   HCT 43.9 03/30/2023   PLT 193 03/30/2023   GLUCOSE 98 07/29/2023   CHOL 103 03/30/2023   TRIG 76 03/30/2023   HDL 40 03/30/2023   LDLCALC 47 03/30/2023   ALT 13 03/30/2023   AST 18 03/30/2023   NA 141 03/30/2023   K 4.6 03/30/2023   CL 103 03/30/2023   CREATININE 0.91 03/30/2023   BUN 15 03/30/2023   CO2 17 (L) 03/30/2023   TSH 2.880 04/25/2022   INR 0.9 05/29/2020   HGBA1C 6.0 (H) 03/30/2023   MICROALBUR 30 07/11/2020      Assessment & Plan:  Dizzy Assessment & Plan: Go to ED.    Bradycardia Assessment & Plan: Patient started feeling worse during his visit and we opted to call ems to take him on to ED for further evaluation.  Orders: -     LONG TERM MONITOR (3-14 DAYS); Future  Hypertensive heart disease with chronic diastolic congestive heart failure (HCC) Assessment & Plan: Well controlled normally. Increased towards end of his appt.   No changes to medicines. Aspirin  81 mg daily, Lisinopril  20 mg daily. Continue to work on eating a healthy diet and exercise.  Labs drawn today.   Orders: -     CBC with Differential/Platelet -     Comprehensive metabolic panel with GFR -     Hemoglobin A1c  Mixed hyperlipidemia Assessment & Plan: Well controlled.  No changes to medicines. Atorvastatin  40 mg daily, Vascepa  1 gm 2 capsules twice daily.  Continue to work on eating a healthy diet and exercise.  Labs drawn today.   Orders: -     Lipid panel  Irregular heart rhythm Assessment & Plan: EKG concerning for bradycardia/PACs. Patient deteriorated during his  appt and ems was called.   Orders: -     EKG 12-Lead  Diabetes mellitus without complication (HCC) Assessment & Plan: Well controlled. Checking labs.    Orders: -  Blood Glucose Monitoring Suppl; 1 each by Does not apply route in the morning, at noon, and at bedtime. May substitute to any manufacturer covered by patient's insurance.  Dispense: 1 each; Refill: 0 -     Lancet Device; 1 each by Does not apply route in the morning, at noon, and at bedtime. May substitute to any manufacturer covered by patient's insurance.  Dispense: 1 each; Refill: 0 -     Lancets Misc.; 1 each by Does not apply route in the morning, at noon, and at bedtime. May substitute to any manufacturer covered by patient's insurance.  Dispense: 100 each; Refill: 3 -     POCT Glucose Fingerstick -     POCT Glucose Fingerstick  Acute pain of right shoulder Assessment & Plan: Ordered xray  Orders: -     DG Shoulder Right; Future  Memory loss Assessment & Plan: Check labs  Orders: -     B12 and Folate Panel -     Methylmalonic acid, serum     Meds ordered this encounter  Medications   Blood Glucose Monitoring Suppl DEVI    Sig: 1 each by Does not apply route in the morning, at noon, and at bedtime. May substitute to any manufacturer covered by patient's insurance.    Dispense:  1 each    Refill:  0   DISCONTD: Glucose Blood (BLOOD GLUCOSE TEST STRIPS) STRP    Sig: May substitute to any manufacturer covered by patient's insurance. Check sugar fasting.    Dispense:  100 strip    Refill:  3   Lancet Device MISC    Sig: 1 each by Does not apply route in the morning, at noon, and at bedtime. May substitute to any manufacturer covered by patient's insurance.    Dispense:  1 each    Refill:  0   Lancets Misc. MISC    Sig: 1 each by Does not apply route in the morning, at noon, and at bedtime. May substitute to any manufacturer covered by patient's insurance.    Dispense:  100 each    Refill:  3     Orders Placed This Encounter  Procedures   DG Shoulder Right   CBC with Differential/Platelet   Comprehensive metabolic panel with GFR   Hemoglobin A1c   Lipid panel   B12 and Folate Panel   Methylmalonic acid, serum   LONG TERM MONITOR (3-14 DAYS)   POCT Glucose Fingerstick   POCT Glucose Fingerstick   EKG 12-Lead     Follow-up: Return in about 3 months (around 10/29/2023).   I,Marla I Leal-Borjas,acting as a scribe for Mercy Stall, MD.,have documented all relevant documentation on the behalf of Mercy Stall, MD,as directed by  Mercy Stall, MD while in the presence of Mercy Stall, MD.   An After Visit Summary was printed and given to the patient.  I attest that I have reviewed this visit and agree with the plan scribed by my staff.  Total time spent on today's visit was 60 minutes, including both face-to-face time and nonface-to-face time personally spent on review of chart (labs and imaging), discussing labs and goals, discussing further work-up, treatment options, referrals to specialist if needed, reviewing outside records of pertinent, answering patient's questions, and coordinating care.   Mercy Stall, MD [facility] [phone]    Mercy Stall, MD [facility] [phone]

## 2023-07-29 ENCOUNTER — Encounter: Payer: Self-pay | Admitting: Family Medicine

## 2023-07-29 ENCOUNTER — Other Ambulatory Visit: Payer: Self-pay | Admitting: Family Medicine

## 2023-07-29 ENCOUNTER — Ambulatory Visit: Attending: Family Medicine

## 2023-07-29 ENCOUNTER — Ambulatory Visit: Payer: Medicare PPO | Admitting: Family Medicine

## 2023-07-29 VITALS — BP 128/74 | HR 85 | Temp 98.2°F | Ht 69.0 in | Wt 182.0 lb

## 2023-07-29 DIAGNOSIS — M25511 Pain in right shoulder: Secondary | ICD-10-CM

## 2023-07-29 DIAGNOSIS — R001 Bradycardia, unspecified: Secondary | ICD-10-CM | POA: Insufficient documentation

## 2023-07-29 DIAGNOSIS — E119 Type 2 diabetes mellitus without complications: Secondary | ICD-10-CM

## 2023-07-29 DIAGNOSIS — I2510 Atherosclerotic heart disease of native coronary artery without angina pectoris: Secondary | ICD-10-CM | POA: Diagnosis not present

## 2023-07-29 DIAGNOSIS — E860 Dehydration: Secondary | ICD-10-CM | POA: Diagnosis not present

## 2023-07-29 DIAGNOSIS — R7301 Impaired fasting glucose: Secondary | ICD-10-CM

## 2023-07-29 DIAGNOSIS — R42 Dizziness and giddiness: Secondary | ICD-10-CM | POA: Insufficient documentation

## 2023-07-29 DIAGNOSIS — N3941 Urge incontinence: Secondary | ICD-10-CM

## 2023-07-29 DIAGNOSIS — E1122 Type 2 diabetes mellitus with diabetic chronic kidney disease: Secondary | ICD-10-CM | POA: Diagnosis not present

## 2023-07-29 DIAGNOSIS — I110 Hypertensive heart disease with heart failure: Secondary | ICD-10-CM

## 2023-07-29 DIAGNOSIS — I5032 Chronic diastolic (congestive) heart failure: Secondary | ICD-10-CM | POA: Diagnosis not present

## 2023-07-29 DIAGNOSIS — E782 Mixed hyperlipidemia: Secondary | ICD-10-CM | POA: Diagnosis not present

## 2023-07-29 DIAGNOSIS — R531 Weakness: Secondary | ICD-10-CM | POA: Diagnosis not present

## 2023-07-29 DIAGNOSIS — I129 Hypertensive chronic kidney disease with stage 1 through stage 4 chronic kidney disease, or unspecified chronic kidney disease: Secondary | ICD-10-CM | POA: Diagnosis not present

## 2023-07-29 DIAGNOSIS — R413 Other amnesia: Secondary | ICD-10-CM

## 2023-07-29 DIAGNOSIS — I499 Cardiac arrhythmia, unspecified: Secondary | ICD-10-CM

## 2023-07-29 DIAGNOSIS — R55 Syncope and collapse: Secondary | ICD-10-CM | POA: Diagnosis not present

## 2023-07-29 DIAGNOSIS — C7902 Secondary malignant neoplasm of left kidney and renal pelvis: Secondary | ICD-10-CM | POA: Diagnosis not present

## 2023-07-29 DIAGNOSIS — N1831 Chronic kidney disease, stage 3a: Secondary | ICD-10-CM | POA: Diagnosis not present

## 2023-07-29 DIAGNOSIS — I482 Chronic atrial fibrillation, unspecified: Secondary | ICD-10-CM | POA: Diagnosis not present

## 2023-07-29 DIAGNOSIS — Z905 Acquired absence of kidney: Secondary | ICD-10-CM | POA: Diagnosis not present

## 2023-07-29 DIAGNOSIS — I10 Essential (primary) hypertension: Secondary | ICD-10-CM | POA: Diagnosis not present

## 2023-07-29 LAB — POCT GLUCOSE FINGERSTICK
Glucose: 109 — AB (ref 70–99)
Glucose: 98 (ref 70–99)

## 2023-07-29 MED ORDER — LANCETS MISC. MISC
1.0000 | Freq: Three times a day (TID) | 3 refills | Status: AC
Start: 2023-07-29 — End: 2023-08-28

## 2023-07-29 MED ORDER — BLOOD GLUCOSE TEST VI STRP
ORAL_STRIP | 3 refills | Status: DC
Start: 2023-07-29 — End: 2023-07-29

## 2023-07-29 MED ORDER — LANCET DEVICE MISC: 1.0000 | Freq: Three times a day (TID) | 0 refills | Status: AC

## 2023-07-29 MED ORDER — BLOOD GLUCOSE MONITORING SUPPL DEVI
1.0000 | Freq: Three times a day (TID) | 0 refills | Status: AC
Start: 2023-07-29 — End: ?

## 2023-07-29 NOTE — Assessment & Plan Note (Signed)
 Well controlled normally. Increased towards end of his appt.   No changes to medicines. Aspirin  81 mg daily, Lisinopril  20 mg daily. Continue to work on eating a healthy diet and exercise.  Labs drawn today.

## 2023-07-29 NOTE — Assessment & Plan Note (Signed)
 Go to ED

## 2023-07-29 NOTE — Progress Notes (Unsigned)
 EP to read.

## 2023-07-29 NOTE — Assessment & Plan Note (Signed)
 Well controlled.  No changes to medicines. Atorvastatin 40 mg daily, Vascepa 1 gm 2 capsules twice daily.  Continue to work on eating a healthy diet and exercise.  Labs drawn today.

## 2023-07-29 NOTE — Assessment & Plan Note (Signed)
 Ordered xray

## 2023-07-29 NOTE — Assessment & Plan Note (Signed)
 Well controlled. Checking labs.

## 2023-07-29 NOTE — Patient Instructions (Addendum)
 Ordering cardiac monitor.  Recommend push fluids.  Checking labs.  Check sugars fasting daily when taking prednisone . If sugars greater than 200, please call us .   I ordered a shoulder xray at the [facility] [address], [place], [place] [address] [phone]  IMAGING 8-5 PM MONDAY THROUGH FRIDAY - you may walk in during this time.

## 2023-07-29 NOTE — Assessment & Plan Note (Signed)
 EKG concerning for bradycardia/PACs. Patient deteriorated during his appt and ems was called.

## 2023-07-29 NOTE — Assessment & Plan Note (Signed)
 Patient started feeling worse during his visit and we opted to call ems to take him on to ED for further evaluation.

## 2023-07-29 NOTE — Assessment & Plan Note (Signed)
 Check labs

## 2023-07-30 ENCOUNTER — Other Ambulatory Visit

## 2023-07-30 DIAGNOSIS — I482 Chronic atrial fibrillation, unspecified: Secondary | ICD-10-CM | POA: Diagnosis not present

## 2023-07-30 DIAGNOSIS — C7902 Secondary malignant neoplasm of left kidney and renal pelvis: Secondary | ICD-10-CM | POA: Diagnosis not present

## 2023-07-30 DIAGNOSIS — E860 Dehydration: Secondary | ICD-10-CM | POA: Diagnosis not present

## 2023-07-31 ENCOUNTER — Telehealth: Payer: Self-pay

## 2023-07-31 NOTE — Transitions of Care (Post Inpatient/ED Visit) (Signed)
   07/31/2023  Name: [name] MRN: [id number] DOB: 03/27/1940  Today's TOC FU Call Status: Today's TOC FU Call Status:: Successful TOC FU Call Completed TOC FU Call Complete Date: 07/31/23 (spoke with patient who states he does not feel he needs the calls and states, I've lived this long without it. TOC RN encouraged pateint to schedule hospital f/u with PCP and to stay hydrated) Patient's Name and Date of Birth confirmed.  Transition Care Management Follow-up Telephone Call Date of Discharge: 07/30/23 Discharge Facility: Other [facility]) Name of Other (Non[facility]) Discharge Facility: [facility] Type of Discharge: Inpatient Admission Primary Inpatient Discharge Diagnosis:: Dehydration  Tonia Frankel RN, CCM [facility]  VBCI-Population Health RN Care Manager [phone]

## 2023-08-03 ENCOUNTER — Telehealth: Payer: Self-pay

## 2023-08-03 ENCOUNTER — Ambulatory Visit: Admitting: Family Medicine

## 2023-08-03 VITALS — BP 130/70 | HR 70 | Temp 97.8°F | Resp 16 | Wt 183.0 lb

## 2023-08-03 DIAGNOSIS — E860 Dehydration: Secondary | ICD-10-CM | POA: Diagnosis not present

## 2023-08-03 DIAGNOSIS — E1169 Type 2 diabetes mellitus with other specified complication: Secondary | ICD-10-CM

## 2023-08-03 DIAGNOSIS — J432 Centrilobular emphysema: Secondary | ICD-10-CM | POA: Diagnosis not present

## 2023-08-03 DIAGNOSIS — N179 Acute kidney failure, unspecified: Secondary | ICD-10-CM | POA: Diagnosis not present

## 2023-08-03 DIAGNOSIS — E782 Mixed hyperlipidemia: Secondary | ICD-10-CM

## 2023-08-03 DIAGNOSIS — I480 Paroxysmal atrial fibrillation: Secondary | ICD-10-CM

## 2023-08-03 NOTE — Telephone Encounter (Signed)
 Copied from CRM [id number]. Topic: Appointments - Appointment Scheduling >> Aug 03, 2023  8:53 AM [place] MATSU wrote: Patient/patient representative is calling to schedule an appointment. Per patient [name] patient was just discharged on 07/31/23. Patient is requesting an appointment with Dr. Sherre however when offered next available on 08/11/23 per family of patient that is too far out. They requested I send  message to clinic. Per patients Family patient also needs assistance with putting on his holter monitor. Per tamara she is out of town so they would not be able to place holter until Thursday. Oscar is requesting a call back.  CB# [phone]  May you please assist.

## 2023-08-03 NOTE — Telephone Encounter (Signed)
 Patient was scheduled this afternoon at 3:20 pm.

## 2023-08-03 NOTE — Progress Notes (Unsigned)
 Subjective:  Patient ID: [name], male    DOB: 1940-12-21  Age: 83 y.o. MRN: [id number]  Chief Complaint  Patient presents with   Dizziness    HPI:  Patient was admitted at [facility] on 07/29/23 to 07/30/23 due to syncope and collapse, chronic atrial fibrillation.  Patient was discharge at home stable. They stated on Eliquis 5 mg BID.  They discontinued aspirin   81 mg daily.  He was admitted in guarded condition to inpatient care specialists service. The treatment was hydrated the patient with IV fluids and held lisinopril . Echocardiogram showed preserved EF 60-65%.      07/29/2023    9:26 AM 03/30/2023    7:33 AM 01/15/2023    1:07 PM 08/20/2022    7:27 AM 07/03/2022    1:54 PM  Depression screen PHQ 2/9  Decreased Interest 0 0 0 0 0  Down, Depressed, Hopeless 0 0 0 0 0  PHQ - 2 Score 0 0 0 0 0  Altered sleeping   0    Tired, decreased energy   0    Change in appetite   0    Feeling bad or failure about yourself    0    Trouble concentrating   0    Moving slowly or fidgety/restless   0    Suicidal thoughts   0    PHQ-9 Score   0    Difficult doing work/chores   Not difficult at all          03/30/2023    7:33 AM  Fall Risk   Falls in the past year? 1  Number falls in past yr: 1  Injury with Fall? 0  Risk for fall due to : No Fall Risks    Patient Care Team: Sherre Clapper, MD as PCP - General (Family Medicine) Revankar, Jennifer SAUNDERS, MD as Consulting Physician (Cardiology) Dawley, Lani BROCKS, DO as Consulting Physician Joshua Alm Hamilton, MD as Consulting Physician (Neurosurgery)   Review of Systems  Constitutional:  Positive for fatigue. Negative for chills, fever and unexpected weight change.  HENT:  Negative for congestion, ear pain, sinus pain and sore throat.   Respiratory:  Positive for shortness of breath. Negative for cough.   Cardiovascular:  Negative for chest pain and palpitations.  Gastrointestinal:  Negative for abdominal pain, blood in stool,  constipation, diarrhea, nausea and vomiting.  Endocrine: Negative for polydipsia.  Genitourinary:  Negative for dysuria.  Musculoskeletal:  Negative for back pain.  Skin:  Negative for rash.  Neurological:  Negative for headaches.    Current Outpatient Medications on File Prior to Visit  Medication Sig Dispense Refill   ACCU-CHEK GUIDE TEST test strip MAY SUBSTITUTE TO ANY MANUFACTURER COVERED BY PATIENT'S INSURANCE. CHECK SUGAR FASTING. 100 strip 3   Accu-Chek Softclix Lancets lancets SMARTSIG:Topical     atorvastatin  (LIPITOR) 40 MG tablet TAKE 1 TABLET BY MOUTH EVERY DAY 90 tablet 1   Blood Glucose Monitoring Suppl DEVI 1 each by Does not apply route in the morning, at noon, and at bedtime. May substitute to any manufacturer covered by patient's insurance. 1 each 0   carboxymethylcellulose (REFRESH PLUS) 0.5 % SOLN Place 1 drop into both eyes 3 (three) times daily as needed for dry eyes (dry/irritated eyes).     cholecalciferol  (VITAMIN D ) 1000 UNITS tablet Take 1,000 Units by mouth in the morning.     COSOPT 2-0.5 % ophthalmic solution 1 drop 2 (two) times daily.  ELIQUIS 5 MG TABS tablet Take 5 mg by mouth 2 (two) times daily.     icosapent  Ethyl (VASCEPA ) 1 g capsule Take 2 capsules (2 g total) by mouth 2 (two) times daily. 360 capsule 1   Lancet Device MISC 1 each by Does not apply route in the morning, at noon, and at bedtime. May substitute to any manufacturer covered by patient's insurance. 1 each 0   Lancets Misc. MISC 1 each by Does not apply route in the morning, at noon, and at bedtime. May substitute to any manufacturer covered by patient's insurance. 100 each 3   latanoprost (XALATAN) 0.005 % ophthalmic solution 1 drop at bedtime.     lisinopril  (ZESTRIL ) 20 MG tablet TAKE 1 TABLET BY MOUTH EVERY DAY 90 tablet 1   metFORMIN  (GLUCOPHAGE ) 500 MG tablet TAKE 1 TABLET (500 MG TOTAL) BY MOUTH DAILY. 90 tablet 1   nitroGLYCERIN  (NITROSTAT ) 0.4 MG SL tablet Place 1 tablet (0.4 mg  total) under the tongue every 5 (five) minutes x 3 doses as needed for chest pain. 25 tablet 12   omeprazole  (PRILOSEC) 40 MG capsule TAKE 1 CAPSULE (40 MG TOTAL) BY MOUTH DAILY. 90 capsule 1   predniSONE  (DELTASONE ) 10 MG tablet Take 1 tablet (10 mg total) by mouth daily with breakfast. 10 tablet 0   tamsulosin  (FLOMAX ) 0.4 MG CAPS capsule Take 1 capsule (0.4 mg total) by mouth daily after supper. 90 capsule 1   No current facility-administered medications on file prior to visit.   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 06/17/2016   Deficiency of other specified B group vitamins 05/20/2019   Depression 04/04/2019   Diabetes mellitus due to underlying condition with unspecified complications (HCC) 06/28/2019   Diabetes mellitus without complication (HCC)    Type II   Diarrhea 11/21/2018   Dyslipidemia 06/17/2016   Dyspnea on exertion 06/28/2019   Dysrhythmia    Essential (primary) hypertension 06/17/2016   Essential hypertension 06/17/2016   GERD (gastroesophageal reflux disease)    on meds   Greater trochanteric pain syndrome 07/03/2020   Headache    History of hiatal hernia    History of kidney stones    History of prostate cancer 05/20/2019   Hypercholesteremia 04/04/2019   Hyperlipemia    Hypertension    Impaired fasting blood sugar 05/20/2019   Impingement syndrome of shoulder region 01/12/2018   Impingement syndrome of shoulder region 01/12/2018   Low back  pain 02/20/2014   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   Lumbar radiculopathy 05/24/2019   Malignant neoplasm prostate (HCC) 05/20/2019   Medicare annual wellness visit, subsequent 01/16/2020   Mixed hyperlipidemia 08/31/2019   Neck pain 07/03/2015   Paresthesia of hand 01/12/2018   Paresthesia of upper limb 01/12/2018   Paroxysmal atrial fibrillation (HCC) 04/04/2019   PONV (postoperative nausea and vomiting)    Pyelonephritis 06/08/2021   S/P cervical spinal fusion 03/04/2018   S/P lumbar fusion 12/09/2019  S/P lumbar spinal fusion 09/29/2013   S/P lumbar spinal fusion 09/29/2013   Sepsis (HCC) 11/22/2018   Shoulder pain 01/12/2018   Spinal 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 01/26/2018   Stenosis of intervertebral foramina 01/12/2018   Trochanteric bursitis of right hip 04/19/2020   Past Surgical History:  Procedure Laterality Date   anal fissures     ANTERIOR CERVICAL DECOMP/DISCECTOMY FUSION N/A 03/04/2018   Procedure: Anterior Cervical Decompression Fusion - Cervical three-Cervical four - Cervical four-Cervical five;  Surgeon: Joshua Alm RAMAN, MD;  Location: [facility] OR;  Service: Neurosurgery;  Laterality: N/A;   ANTERIOR LAT LUMBAR FUSION N/A 05/30/2020   Procedure: Anterior Lateral Lumbar Fusion Lumbar One-Two with Lateral Plate Removal of pedicle screws Lumbar One and Posterior Lateral Fusion Lumbar One-Lumbar Three;  Surgeon: Joshua Alm RAMAN, MD;  Location: [facility] OR;  Service: Neurosurgery;  Laterality: N/A;   BACK SURGERY     CARDIAC CATHETERIZATION     no PCI   CARPAL TUNNEL RELEASE Left 03/04/2018   Procedure: Carpal Tunnel Release - left;  Surgeon: Joshua Alm RAMAN, MD;  Location: [facility] OR;  Service: Neurosurgery;  Laterality: Left;   CHOLECYSTECTOMY     COLONOSCOPY W/ POLYPECTOMY  08/30/2012   Colonic polyp status post polypectomy.  Pancolonic diverticulosis predominantly in the sigmoid colon. Small internal hemorrhoids. Moderate internal hemorhroids.    ESOPHAGOGASTRODUODENOSCOPY  08/04/2016   Schatzkis  ring status post esophageal dilatation. Small hiatal hernia. Mild gastritis.    EYE SURGERY     cataract removal bilateral eyes   FOOT SURGERY     left heel   HARDWARE REMOVAL N/A 05/30/2020   Procedure: HARDWARE REMOVAL;  Surgeon: Joshua Alm RAMAN, MD;  Location: [facility] OR;  Service: Neurosurgery;  Laterality: N/A;   HERNIA REPAIR     LAMINECTOMY WITH POSTERIOR LATERAL ARTHRODESIS LEVEL 2 N/A 12/09/2019   Procedure: Laminectomy and Foraminotomy - Lumbar one-two, Lumbar two-three, posterolateral instrumented fusion Lumbar one-three, removal of instrumentation Lumbar three-five;  Surgeon: Joshua Alm RAMAN, MD;  Location: [facility] OR;  Service: Neurosurgery;  Laterality: N/A;   LEFT HEART CATH AND CORONARY ANGIOGRAPHY N/A 08/03/2019   Procedure: LEFT HEART CATH AND CORONARY ANGIOGRAPHY;  Surgeon: Dann Candyce RAMAN, MD;  Location: [facility] INVASIVE CV LAB;  Service: Cardiovascular;  Laterality: N/A;   SHOULDER SURGERY     bilateral   WISDOM TOOTH EXTRACTION      Family History  Problem Relation Age of Onset   Colon cancer Neg Hx    Esophageal cancer Neg Hx    Rectal cancer Neg Hx    Stomach cancer Neg Hx    Colon polyps Neg Hx    Social History   Socioeconomic History   Marital status: Widowed    Spouse name: Not on file   Number of children: 2   Years of education: Not on file   Highest education level: GED or equivalent  Occupational History   Not on file  Tobacco Use   Smoking status: Former    Current packs/day: 0.00    Average packs/day: 0.5 packs/day for 35.0 years (17.5 ttl pk-yrs)    Types: Cigarettes    Start date: 02/10/1958    Quit date: 02/10/1993    Years since quitting: 30.4   Smokeless tobacco: Former    Types: Chew    Quit date: 02/10/2006  Vaping Use   Vaping status: Never Used  Substance and Sexual Activity   Alcohol  use: Not Currently    Alcohol /week: 1.0 standard drink of alcohol   Types: 1 Cans of beer per week    Comment: occ   Drug use: No   Sexual activity: Yes     Partners: Female  Other Topics Concern   Not on file  Social History Narrative   Not on file   Social Drivers of Health   Financial Resource Strain: Low Risk  (03/07/2023)   Overall Financial Resource Strain (CARDIA)    Difficulty of Paying Living Expenses: Not hard at all  Food Insecurity: Low Risk  (04/15/2023)   Received from [facility]   Hunger Vital Sign    Within the past 12 months, you worried that your food would run out before you got money to buy more: Never true    Within the past 12 months, the food you bought just didn't last and you didn't have money to get more. : Never true  Transportation Needs: Unmet Transportation Needs (04/15/2023)   Received from [facility]    In the past 12 months, has lack of reliable transportation kept you from medical appointments, meetings, work or from getting things needed for daily living? : Yes  Physical Activity: Inactive (03/07/2023)   Exercise Vital Sign    Days of Exercise per Week: 0 days    Minutes of Exercise per Session: 0 min  Stress: Stress Concern Present (03/07/2023)   [employer] of Occupational Health - Occupational Stress Questionnaire    Feeling of Stress : To some extent  Social Connections: Moderately Integrated (03/07/2023)   Social Connection and Isolation Panel    Frequency of Communication with Friends and Family: More than three times a week    Frequency of Social Gatherings with Friends and Family: More than three times a week    Attends Religious Services: More than 4 times per year    Active Member of [employer] or Organizations: Yes    Attends Banker Meetings: Patient declined    Marital Status: Widowed  Recent Concern: Social Connections - Moderately Isolated (01/15/2023)   Social Connection and Isolation Panel    Frequency of Communication with Friends and Family: More than three times a week    Frequency of Social Gatherings with Friends and Family: More than three times a  week    Attends Religious Services: More than 4 times per year    Active Member of [employer] or Organizations: No    Attends Banker Meetings: Patient declined    Marital Status: Widowed    Objective:  BP 130/70   Pulse 70   Temp 97.8 F (36.6 C)   Resp 16   Wt 183 lb (83 kg)   SpO2 96%   BMI 27.02 kg/m      08/03/2023    3:22 PM 07/29/2023    7:34 AM 04/29/2023    1:17 PM  BP/Weight  Systolic BP 130 128 128  Diastolic BP 70 74 62  Wt. (Lbs) 183 182 177.6  BMI 27.02 kg/m2 26.88 kg/m2 26.23 kg/m2    Physical Exam  {Perform Simple Foot Exam  Perform Detailed exam:1} {Insert foot Exam (Optional):30965}   Lab Results  Component Value Date   WBC 8.2 03/30/2023   HGB 14.8 03/30/2023   HCT 43.9 03/30/2023   PLT 193 03/30/2023   GLUCOSE 98 07/29/2023   CHOL 103 03/30/2023   TRIG 76 03/30/2023   HDL 40 03/30/2023   LDLCALC 47 03/30/2023   ALT 13 03/30/2023   AST 18 03/30/2023   NA 141 03/30/2023  K 4.6 03/30/2023   CL 103 03/30/2023   CREATININE 0.91 03/30/2023   BUN 15 03/30/2023   CO2 17 (L) 03/30/2023   TSH 2.880 04/25/2022   INR 0.9 05/29/2020   HGBA1C 6.0 (H) 03/30/2023   MICROALBUR 30 07/11/2020      Assessment & Plan:  There are no diagnoses linked to this encounter.   No orders of the defined types were placed in this encounter.   No orders of the defined types were placed in this encounter.    Follow-up: No follow-ups on file.   I,Marla I Leal-Borjas,acting as a scribe for Abigail Free, MD.,have documented all relevant documentation on the behalf of Abigail Free, MD,as directed by  Abigail Free, MD while in the presence of Abigail Free, MD.   An After Visit Summary was printed and given to the patient.  Abigail Free, MD [facility] [phone]

## 2023-08-06 ENCOUNTER — Encounter: Payer: Self-pay | Admitting: Family Medicine

## 2023-08-06 DIAGNOSIS — Z5112 Encounter for antineoplastic immunotherapy: Secondary | ICD-10-CM | POA: Diagnosis not present

## 2023-08-06 DIAGNOSIS — C678 Malignant neoplasm of overlapping sites of bladder: Secondary | ICD-10-CM | POA: Diagnosis not present

## 2023-08-06 DIAGNOSIS — C642 Malignant neoplasm of left kidney, except renal pelvis: Secondary | ICD-10-CM | POA: Diagnosis not present

## 2023-08-06 DIAGNOSIS — E860 Dehydration: Secondary | ICD-10-CM | POA: Insufficient documentation

## 2023-08-06 DIAGNOSIS — E1169 Type 2 diabetes mellitus with other specified complication: Secondary | ICD-10-CM | POA: Insufficient documentation

## 2023-08-06 DIAGNOSIS — J432 Centrilobular emphysema: Secondary | ICD-10-CM | POA: Insufficient documentation

## 2023-08-06 NOTE — Assessment & Plan Note (Signed)
 LAST A1C 03/2023 6.0. RECHECK AT NEXT VISIT.  PRESCRIPTION FOR LANCETS SENT.

## 2023-08-06 NOTE — Assessment & Plan Note (Signed)
 NOT CURRENTLY ON ANY TREATMENT.  DUE TO SHORTNESS OF BREATH, RECOMMEND TRELEGY ONE INHALATION DAILY.

## 2023-08-07 ENCOUNTER — Telehealth: Payer: Self-pay

## 2023-08-07 ENCOUNTER — Other Ambulatory Visit: Payer: Self-pay

## 2023-08-07 DIAGNOSIS — J439 Emphysema, unspecified: Secondary | ICD-10-CM

## 2023-08-07 DIAGNOSIS — J209 Acute bronchitis, unspecified: Secondary | ICD-10-CM

## 2023-08-07 MED ORDER — TRELEGY ELLIPTA 100-62.5-25 MCG/ACT IN AEPB: 1.0000 | INHALATION_SPRAY | Freq: Every day | RESPIRATORY_TRACT | 11 refills | Status: AC

## 2023-08-07 NOTE — Telephone Encounter (Signed)
-----   Message from Abigail Free sent at 08/06/2023  9:31 PM EDT ----- Regarding: REC START INHALER. 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 THIS WAS IDENTIFIED ON A CT SCAN IN THE PAST. DUE TO SHORTNESS OF BREATH, I WOULD RECOMMEND TRIAL ON TRELEGY ONE INHALATION DAILY. PLEASE ASK IF HE WILL PICK UP A SAMPLE AND SEND PRESCRIPTION TO SEE IF IT HELPS. GIVE LOWER DOSE. DR. FREE

## 2023-08-07 NOTE — Telephone Encounter (Signed)
 Patient called. Verbalized understanding. Patient stated he will come by this morning and pick up sample of Trelegy. Prescription has been sent to [facility].

## 2023-08-10 ENCOUNTER — Ambulatory Visit: Attending: Cardiology | Admitting: Cardiology

## 2023-08-10 VITALS — BP 126/70 | HR 70 | Ht 71.0 in | Wt 181.6 lb

## 2023-08-10 DIAGNOSIS — E782 Mixed hyperlipidemia: Secondary | ICD-10-CM | POA: Diagnosis not present

## 2023-08-10 DIAGNOSIS — I110 Hypertensive heart disease with heart failure: Secondary | ICD-10-CM

## 2023-08-10 DIAGNOSIS — R0609 Other forms of dyspnea: Secondary | ICD-10-CM | POA: Diagnosis not present

## 2023-08-10 DIAGNOSIS — I5032 Chronic diastolic (congestive) heart failure: Secondary | ICD-10-CM

## 2023-08-10 DIAGNOSIS — I2510 Atherosclerotic heart disease of native coronary artery without angina pectoris: Secondary | ICD-10-CM | POA: Diagnosis not present

## 2023-08-10 DIAGNOSIS — E1169 Type 2 diabetes mellitus with other specified complication: Secondary | ICD-10-CM | POA: Diagnosis not present

## 2023-08-10 DIAGNOSIS — C61 Malignant neoplasm of prostate: Secondary | ICD-10-CM | POA: Diagnosis not present

## 2023-08-10 NOTE — Progress Notes (Signed)
 Cardiology Office Note:    Date:  08/10/2023   ID:  Oscar, Torres [birth year], MRN [id number]  PCP:  Sherre Clapper, MD  Cardiologist:  Lamar Fitch, MD    Referring MD: Sherre Clapper, MD   Chief Complaint  Patient presents with   Shortness of Breath    History of Present Illness:    [name] is a 83 y.o. 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 Comes today to months of follow-up he did have radiation therapy for his prostate cancer however after that he ended up having nephrectomy done with prostate removal and midportion of his urinary bladder.  Trouble recovering after that spent 2 weeks in the recliner could not sleep well at night but now recovering came to me complaining of being weak tired and exhausted.  He was put on immunotherapy, complaining of fatigue tiredness shortness of breath quite easily also noticed anytime he tried to do something his heart will speed up.  Described to have some uneasy sensation in the chest during that time as well.  Past Medical History:  Diagnosis Date   Angina pectoris (HCC) 08/02/2019   Arthritis    on meds   Atrial fibrillation (HCC)    Backache 08/17/2012   Body mass index (BMI) 26.0-26.9, adult 05/08/2020   Cancer (HCC) 10/2015   prostate cancer treated with radiation   Carpal tunnel syndrome 01/18/2018   Cataract    sx-bilaterally   Cervical spondylosis without myelopathy 09/24/2015   Chest tightness 06/28/2019   Chronic back pain 05/30/2013   Chronic low back pain 02/20/2014   Colitis 11/22/2018   Coronary artery disease involving native coronary artery without angina  pectoris 06/17/2016   Deficiency of other specified B group vitamins 05/20/2019   Depression 04/04/2019   Diabetes mellitus due to underlying condition with unspecified complications (HCC) 06/28/2019   Diabetes mellitus without complication (HCC)    Type II   Diarrhea 11/21/2018   Dyslipidemia 06/17/2016   Dyspnea on exertion 06/28/2019   Dysrhythmia    Essential (primary) hypertension 06/17/2016   Essential hypertension 06/17/2016   GERD (gastroesophageal reflux disease)    on meds   Greater trochanteric pain syndrome 07/03/2020   Headache    History of hiatal hernia    History of kidney stones    History of prostate cancer 05/20/2019   Hypercholesteremia 04/04/2019   Hyperlipemia    Hypertension    Impaired fasting blood sugar 05/20/2019   Impingement syndrome of shoulder region 01/12/2018   Impingement syndrome of shoulder region 01/12/2018   Low back pain 02/20/2014   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   Lumbar radiculopathy 05/24/2019   Malignant neoplasm prostate (HCC) 05/20/2019   Medicare annual wellness visit, subsequent 01/16/2020   Mixed hyperlipidemia 08/31/2019   Neck pain 07/03/2015   Paresthesia of hand 01/12/2018   Paresthesia of upper limb 01/12/2018   Paroxysmal atrial fibrillation (HCC) 04/04/2019   PONV (postoperative nausea and vomiting)    Pyelonephritis 06/08/2021   S/P cervical spinal fusion 03/04/2018   S/P lumbar fusion 12/09/2019   S/P lumbar spinal fusion 09/29/2013   S/P lumbar spinal fusion 09/29/2013  Sepsis (HCC) 11/22/2018   Shoulder pain 01/12/2018   Spinal 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 01/26/2018   Stenosis of intervertebral foramina 01/12/2018   Trochanteric bursitis of right hip 04/19/2020    Past Surgical History:  Procedure Laterality Date   anal fissures     ANTERIOR CERVICAL DECOMP/DISCECTOMY FUSION N/A 03/04/2018   Procedure: Anterior Cervical Decompression Fusion - Cervical three-Cervical four - Cervical four-Cervical five;  Surgeon: Joshua Alm RAMAN, MD;   Location: [facility] OR;  Service: Neurosurgery;  Laterality: N/A;   ANTERIOR LAT LUMBAR FUSION N/A 05/30/2020   Procedure: Anterior Lateral Lumbar Fusion Lumbar One-Two with Lateral Plate Removal of pedicle screws Lumbar One and Posterior Lateral Fusion Lumbar One-Lumbar Three;  Surgeon: Joshua Alm RAMAN, MD;  Location: [facility] OR;  Service: Neurosurgery;  Laterality: N/A;   BACK SURGERY     CARDIAC CATHETERIZATION     no PCI   CARPAL TUNNEL RELEASE Left 03/04/2018   Procedure: Carpal Tunnel Release - left;  Surgeon: Joshua Alm RAMAN, MD;  Location: [facility] OR;  Service: Neurosurgery;  Laterality: Left;   CHOLECYSTECTOMY     COLONOSCOPY W/ POLYPECTOMY  08/30/2012   Colonic polyp status post polypectomy.  Pancolonic diverticulosis predominantly in the sigmoid colon. Small internal hemorrhoids. Moderate internal hemorhroids.    ESOPHAGOGASTRODUODENOSCOPY  08/04/2016   Schatzkis ring status post esophageal dilatation. Small hiatal hernia. Mild gastritis.    EYE SURGERY     cataract removal bilateral eyes   FOOT SURGERY     left heel   HARDWARE REMOVAL N/A 05/30/2020   Procedure: HARDWARE REMOVAL;  Surgeon: Joshua Alm RAMAN, MD;  Location: [facility] OR;  Service: Neurosurgery;  Laterality: N/A;   HERNIA REPAIR     LAMINECTOMY WITH POSTERIOR LATERAL ARTHRODESIS LEVEL 2 N/A 12/09/2019   Procedure: Laminectomy and Foraminotomy - Lumbar one-two, Lumbar two-three, posterolateral instrumented fusion Lumbar one-three, removal of instrumentation Lumbar three-five;  Surgeon: Joshua Alm RAMAN, MD;  Location: [facility] OR;  Service: Neurosurgery;  Laterality: N/A;   LEFT HEART CATH AND CORONARY ANGIOGRAPHY N/A 08/03/2019   Procedure: LEFT HEART CATH AND CORONARY ANGIOGRAPHY;  Surgeon: Dann Candyce RAMAN, MD;  Location: [facility] INVASIVE CV LAB;  Service: Cardiovascular;  Laterality: N/A;   SHOULDER SURGERY     bilateral   WISDOM TOOTH EXTRACTION      Current Medications: Current Meds  Medication Sig   ACCU-CHEK GUIDE TEST test strip MAY  SUBSTITUTE TO ANY MANUFACTURER COVERED BY PATIENT'S INSURANCE. CHECK SUGAR FASTING.   Accu-Chek Softclix Lancets lancets SMARTSIG:Topical   atorvastatin  (LIPITOR) 40 MG tablet TAKE 1 TABLET BY MOUTH EVERY DAY   Blood Glucose Monitoring Suppl DEVI 1 each by Does not apply route in the morning, at noon, and at bedtime. May substitute to any manufacturer covered by patient's insurance.   carboxymethylcellulose (REFRESH PLUS) 0.5 % SOLN Place 1 drop into both eyes 3 (three) times daily as needed for dry eyes (dry/irritated eyes).   cholecalciferol  (VITAMIN D ) 1000 UNITS tablet Take 1,000 Units by mouth in the morning.   COSOPT 2-0.5 % ophthalmic solution 1 drop 2 (two) times daily.   ELIQUIS 5 MG TABS tablet Take 5 mg by mouth 2 (two) times daily.   Fluticasone-Umeclidin-Vilant (TRELEGY ELLIPTA) 100-62.5-25 MCG/ACT AEPB Inhale 1 puff into the lungs daily.   icosapent  Ethyl (VASCEPA ) 1 g capsule Take 2 capsules (2 g total) by mouth 2 (two) times daily.   Lancet Device MISC 1 each by Does not apply route in the morning, at noon, and at bedtime. May  substitute to any manufacturer covered by patient's insurance.   Lancets Misc. MISC 1 each by Does not apply route in the morning, at noon, and at bedtime. May substitute to any manufacturer covered by patient's insurance.   latanoprost (XALATAN) 0.005 % ophthalmic solution 1 drop at bedtime.   lisinopril  (ZESTRIL ) 20 MG tablet TAKE 1 TABLET BY MOUTH EVERY DAY   metFORMIN  (GLUCOPHAGE ) 500 MG tablet TAKE 1 TABLET (500 MG TOTAL) BY MOUTH DAILY.   nitroGLYCERIN  (NITROSTAT ) 0.4 MG SL tablet Place 1 tablet (0.4 mg total) under the tongue every 5 (five) minutes x 3 doses as needed for chest pain.   omeprazole  (PRILOSEC) 40 MG capsule TAKE 1 CAPSULE (40 MG TOTAL) BY MOUTH DAILY.   tamsulosin  (FLOMAX ) 0.4 MG CAPS capsule Take 1 capsule (0.4 mg total) by mouth daily after supper.     Allergies:   Oxycodone    Social History   Socioeconomic History   Marital  status: Widowed    Spouse name: Not on file   Number of children: 2   Years of education: Not on file   Highest education level: GED or equivalent  Occupational History   Not on file  Tobacco Use   Smoking status: Former    Current packs/day: 0.00    Average packs/day: 0.5 packs/day for 35.0 years (17.5 ttl pk-yrs)    Types: Cigarettes    Start date: 02/10/1958    Quit date: 02/10/1993    Years since quitting: 30.5   Smokeless tobacco: Former    Types: Chew    Quit date: 02/10/2006  Vaping Use   Vaping status: Never Used  Substance and Sexual Activity   Alcohol  use: Not Currently    Alcohol /week: 1.0 standard drink of alcohol     Types: 1 Cans of beer per week    Comment: occ   Drug use: No   Sexual activity: Yes    Partners: Female  Other Topics Concern   Not on file  Social History Narrative   Not on file   Social Drivers of Health   Financial Resource Strain: Low Risk  (03/07/2023)   Overall Financial Resource Strain (CARDIA)    Difficulty of Paying Living Expenses: Not hard at all  Food Insecurity: Low Risk  (04/15/2023)   Received from [facility]   Hunger Vital Sign    Within the past 12 months, you worried that your food would run out before you got money to buy more: Never true    Within the past 12 months, the food you bought just didn't last and you didn't have money to get more. : Never true  Transportation Needs: Unmet Transportation Needs (04/15/2023)   Received from [facility]    In the past 12 months, has lack of reliable transportation kept you from medical appointments, meetings, work or from getting things needed for daily living? : Yes  Physical Activity: Inactive (03/07/2023)   Exercise Vital Sign    Days of Exercise per Week: 0 days    Minutes of Exercise per Session: 0 min  Stress: Stress Concern Present (03/07/2023)   [employer] of Occupational Health - Occupational Stress Questionnaire    Feeling of Stress : To some extent   Social Connections: Moderately Integrated (03/07/2023)   Social Connection and Isolation Panel    Frequency of Communication with Friends and Family: More than three times a week    Frequency of Social Gatherings with Friends and Family: More than three times a week  Attends Religious Services: More than 4 times per year    Active Member of Clubs or Organizations: Yes    Attends Banker Meetings: Patient declined    Marital Status: Widowed  Recent Concern: Social Connections - Moderately Isolated (01/15/2023)   Social Connection and Isolation Panel    Frequency of Communication with Friends and Family: More than three times a week    Frequency of Social Gatherings with Friends and Family: More than three times a week    Attends Religious Services: More than 4 times per year    Active Member of [employer] or Organizations: No    Attends Banker Meetings: Patient declined    Marital Status: Widowed     Family History: The patient's family history is negative for Colon cancer, Esophageal cancer, Rectal cancer, Stomach cancer, and Colon polyps. ROS:   Please see the history of present illness.    All 14 point review of systems negative except as described per history of present illness  EKGs/Labs/Other Studies Reviewed:         Recent Labs: 03/30/2023: ALT 13; BUN 15; Creatinine, Ser 0.91; Hemoglobin 14.8; Platelets 193; Potassium 4.6; Sodium 141  Recent Lipid Panel    Component Value Date/Time   CHOL 103 03/30/2023 0811   TRIG 76 03/30/2023 0811   HDL 40 03/30/2023 0811   CHOLHDL 2.6 03/30/2023 0811   CHOLHDL 2.6 01/24/2017 0521   VLDL 15 01/24/2017 0521   LDLCALC 47 03/30/2023 0811    Physical Exam:    VS:  BP 126/70 (BP Location: Right Arm, Patient Position: Sitting)   Pulse 70   Ht 5' 11 (1.803 m)   Wt 181 lb 9.6 oz (82.4 kg)   SpO2 96%   BMI 25.33 kg/m     Wt Readings from Last 3 Encounters:  08/10/23 181 lb 9.6 oz (82.4 kg)  08/03/23 183  lb (83 kg)  07/29/23 182 lb (82.6 kg)     GEN:  Well nourished, well developed in no acute distress HEENT: Normal NECK: No JVD; No carotid bruits LYMPHATICS: No lymphadenopathy CARDIAC: RRR, no murmurs, no rubs, no gallops RESPIRATORY:  Clear to auscultation without rales, wheezing or rhonchi  ABDOMEN: Soft, non-tender, non-distended MUSCULOSKELETAL:  No edema; No deformity  SKIN: Warm and dry LOWER EXTREMITIES: no swelling NEUROLOGIC:  Alert and oriented x 3 PSYCHIATRIC:  Normal affect   ASSESSMENT:    1. Coronary artery disease involving native coronary artery of native heart without angina pectoris   2. Hypertensive heart disease with chronic diastolic congestive heart failure (HCC)   3. DM type 2 with diabetic mixed hyperlipidemia (HCC)   4. Malignant neoplasm prostate (HCC)    PLAN:    In order of problems listed above:  Coronary disease symptoms somewhat concerning.  However will initiate assessment of left ventricle ejection fraction, will do an echocardiogram first to assess that.  proBNP Chem-7 will be done as well.  If that is unrevealing then we will schedule him to have a stress test make sure he does not have any inducible ischemia. Hypertension blood pressure seems to well-controlled continue present management. Malignant prostate cancer.  Excellent management. Diabetes mellitus.  Stable   Medication Adjustments/Labs and Tests Ordered: Current medicines are reviewed at length with the patient today.  Concerns regarding medicines are outlined above.  Orders Placed This Encounter  Procedures   EKG 12-Lead   Medication changes: No orders of the defined types were placed in this encounter.  Signed, Lamar DOROTHA Fitch, MD, [facility] 08/10/2023 3:16 PM    [facility]

## 2023-08-10 NOTE — Patient Instructions (Signed)
 Medication Instructions:  Your physician recommends that you continue on your current medications as directed. Please refer to the Current Medication list given to you today.  *If you need a refill on your cardiac medications before your next appointment, please call your pharmacy*   Lab Work: BMP, ProBNP-today If you have labs (blood work) drawn today and your tests are completely normal, you will receive your results only by: MyChart Message (if you have MyChart) OR A paper copy in the mail If you have any lab test that is abnormal or we need to change your treatment, we will call you to review the results.   Testing/Procedures: Your physician has requested that you have an echocardiogram. Echocardiography is a painless test that uses sound waves to create images of your heart. It provides your doctor with information about the size and shape of your heart and how well your heart's chambers and valves are working. This procedure takes approximately one hour. There are no restrictions for this procedure. Please do NOT wear cologne, perfume, aftershave, or lotions (deodorant is allowed). Please arrive 15 minutes prior to your appointment time.  Please note: We ask at that you not bring children with you during ultrasound (echo/ vascular) testing. Due to room size and safety concerns, children are not allowed in the ultrasound rooms during exams. Our front office staff cannot provide observation of children in our lobby area while testing is being conducted. An adult accompanying a patient to their appointment will only be allowed in the ultrasound room at the discretion of the ultrasound technician under special circumstances. We apologize for any inconvenience.    Follow-Up: At [facility], you and your health needs are our priority.  As part of our continuing mission to provide you with exceptional heart care, we have created designated Provider Care Teams.  These Care Teams include your  primary Cardiologist (physician) and Advanced Practice Providers (APPs -  Physician Assistants and Nurse Practitioners) who all work together to provide you with the care you need, when you need it.  We recommend signing up for the patient portal called MyChart.  Sign up information is provided on this After Visit Summary.  MyChart is used to connect with patients for Virtual Visits (Telemedicine).  Patients are able to view lab/test results, encounter notes, upcoming appointments, etc.  Non-urgent messages can be sent to your provider as well.   To learn more about what you can do with MyChart, go to [email or web address].    Your next appointment:   6 week(s)  The format for your next appointment:   In Person  Provider:   Lamar Fitch, MD    Other Instructions NA

## 2023-08-11 LAB — BASIC METABOLIC PANEL WITH GFR
BUN/Creatinine Ratio: 17 (ref 10–24)
BUN: 22 mg/dL (ref 8–27)
CO2: 18 mmol/L — ABNORMAL LOW (ref 20–29)
Calcium: 9.6 mg/dL (ref 8.6–10.2)
Chloride: 105 mmol/L (ref 96–106)
Creatinine, Ser: 1.27 mg/dL (ref 0.76–1.27)
Glucose: 80 mg/dL (ref 70–99)
Potassium: 4.5 mmol/L (ref 3.5–5.2)
Sodium: 141 mmol/L (ref 134–144)
eGFR: 55 mL/min/{1.73_m2} — ABNORMAL LOW (ref >59)

## 2023-08-11 LAB — PRO B NATRIURETIC PEPTIDE: NT-Pro BNP: 234 pg/mL (ref 0–486)

## 2023-08-13 ENCOUNTER — Ambulatory Visit: Payer: Self-pay | Admitting: Cardiology

## 2023-08-20 DIAGNOSIS — Z5112 Encounter for antineoplastic immunotherapy: Secondary | ICD-10-CM | POA: Diagnosis not present

## 2023-08-20 DIAGNOSIS — C678 Malignant neoplasm of overlapping sites of bladder: Secondary | ICD-10-CM | POA: Diagnosis not present

## 2023-08-20 DIAGNOSIS — C642 Malignant neoplasm of left kidney, except renal pelvis: Secondary | ICD-10-CM | POA: Diagnosis not present

## 2023-08-21 DIAGNOSIS — N3289 Other specified disorders of bladder: Secondary | ICD-10-CM | POA: Diagnosis not present

## 2023-08-21 DIAGNOSIS — Z8551 Personal history of malignant neoplasm of bladder: Secondary | ICD-10-CM | POA: Diagnosis not present

## 2023-08-21 DIAGNOSIS — Z08 Encounter for follow-up examination after completed treatment for malignant neoplasm: Secondary | ICD-10-CM | POA: Diagnosis not present

## 2023-08-25 ENCOUNTER — Other Ambulatory Visit: Payer: Self-pay | Admitting: Family Medicine

## 2023-08-28 ENCOUNTER — Other Ambulatory Visit: Payer: Self-pay | Admitting: Family Medicine

## 2023-08-28 DIAGNOSIS — E119 Type 2 diabetes mellitus without complications: Secondary | ICD-10-CM

## 2023-09-02 ENCOUNTER — Ambulatory Visit: Attending: Cardiology

## 2023-09-02 DIAGNOSIS — R0609 Other forms of dyspnea: Secondary | ICD-10-CM | POA: Diagnosis not present

## 2023-09-02 LAB — ECHOCARDIOGRAM COMPLETE
Area-P 1/2: 2.97 cm2
S' Lateral: 2.6 cm

## 2023-09-03 DIAGNOSIS — Z79899 Other long term (current) drug therapy: Secondary | ICD-10-CM | POA: Diagnosis not present

## 2023-09-03 DIAGNOSIS — R918 Other nonspecific abnormal finding of lung field: Secondary | ICD-10-CM | POA: Diagnosis not present

## 2023-09-03 DIAGNOSIS — C642 Malignant neoplasm of left kidney, except renal pelvis: Secondary | ICD-10-CM | POA: Diagnosis not present

## 2023-09-07 ENCOUNTER — Ambulatory Visit: Payer: Self-pay | Admitting: Family Medicine

## 2023-09-21 DIAGNOSIS — C642 Malignant neoplasm of left kidney, except renal pelvis: Secondary | ICD-10-CM | POA: Diagnosis not present

## 2023-09-21 DIAGNOSIS — K769 Liver disease, unspecified: Secondary | ICD-10-CM | POA: Diagnosis not present

## 2023-09-21 DIAGNOSIS — C678 Malignant neoplasm of overlapping sites of bladder: Secondary | ICD-10-CM | POA: Diagnosis not present

## 2023-09-21 DIAGNOSIS — Z5112 Encounter for antineoplastic immunotherapy: Secondary | ICD-10-CM | POA: Diagnosis not present

## 2023-09-22 ENCOUNTER — Encounter: Payer: Self-pay | Admitting: Cardiology

## 2023-09-22 ENCOUNTER — Ambulatory Visit: Attending: Cardiology | Admitting: Cardiology

## 2023-09-22 VITALS — BP 126/86 | HR 74 | Ht 71.0 in | Wt 180.8 lb

## 2023-09-22 DIAGNOSIS — I2510 Atherosclerotic heart disease of native coronary artery without angina pectoris: Secondary | ICD-10-CM | POA: Diagnosis not present

## 2023-09-22 DIAGNOSIS — E782 Mixed hyperlipidemia: Secondary | ICD-10-CM | POA: Diagnosis not present

## 2023-09-22 DIAGNOSIS — C678 Malignant neoplasm of overlapping sites of bladder: Secondary | ICD-10-CM

## 2023-09-22 NOTE — Progress Notes (Signed)
 Cardiology Office Note:    Date:  09/22/2023   ID:  Oscar, Torres 1940/02/25, MRN [id number]  PCP:  Sherre Clapper, MD  Cardiologist:  Lamar Fitch, MD    Referring MD: Sherre Clapper, MD   No chief complaint on file.   History of Present Illness:    [name] is a 83 y.o. male past medical history significant for coronary artery disease in June 2021.  Cardiac catheterization showed 30% stenosis of the circumflex, 40% stent of the proximal to mid LAD, ejection fraction was normal at that time he was felt to have arteries of the aorta however diameter was only 40 mm CT after that did not show significant increase in aortic size.  Last time I have seen him which was about a month ago he was complaining of feeling weak tired exhausted.  He was diagnosed with prostate cancer and eventually ended up having nephrectomy with prostate removal and midportion of the urinary bladder he did have some rough time recovering but gradually started getting better now he was put on immunotherapy.  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  Denies have any chest pain tightness squeezing pressure burning chest  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 06/17/2016    Deficiency of other specified B group vitamins 05/20/2019   Depression 04/04/2019   Diabetes mellitus due to underlying condition with unspecified complications (HCC) 06/28/2019   Diabetes mellitus without complication (HCC)    Type II   Diarrhea 11/21/2018   Dyslipidemia 06/17/2016   Dyspnea on exertion 06/28/2019   Dysrhythmia    Essential (primary) hypertension 06/17/2016   Essential hypertension 06/17/2016   GERD (gastroesophageal reflux disease)    on meds   Greater trochanteric pain syndrome 07/03/2020   Headache    History of hiatal hernia    History of kidney stones    History of prostate cancer 05/20/2019   Hypercholesteremia 04/04/2019   Hyperlipemia    Hypertension    Impaired fasting blood sugar 05/20/2019   Impingement syndrome of shoulder region 01/12/2018   Impingement syndrome of shoulder region 01/12/2018   Low back pain 02/20/2014   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   Lumbar radiculopathy 05/24/2019   Malignant neoplasm prostate (HCC) 05/20/2019   Medicare annual wellness visit, subsequent 01/16/2020   Mixed hyperlipidemia 08/31/2019   Neck pain 07/03/2015   Paresthesia of hand 01/12/2018   Paresthesia of upper limb 01/12/2018   Paroxysmal atrial fibrillation (HCC) 04/04/2019   PONV (postoperative nausea and vomiting)    Pyelonephritis 06/08/2021   S/P cervical spinal fusion 03/04/2018   S/P lumbar fusion 12/09/2019   S/P lumbar spinal fusion 09/29/2013   S/P lumbar spinal fusion 09/29/2013   Sepsis (HCC) 11/22/2018   Shoulder pain  01/12/2018   Spinal 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 01/26/2018   Stenosis of intervertebral foramina 01/12/2018   Trochanteric bursitis of right hip 04/19/2020    Past Surgical History:  Procedure Laterality Date   anal fissures     ANTERIOR CERVICAL DECOMP/DISCECTOMY FUSION N/A 03/04/2018   Procedure: Anterior Cervical Decompression Fusion - Cervical three-Cervical four - Cervical four-Cervical five;  Surgeon: Joshua Alm RAMAN, MD;  Location: [facility] OR;  Service:  Neurosurgery;  Laterality: N/A;   ANTERIOR LAT LUMBAR FUSION N/A 05/30/2020   Procedure: Anterior Lateral Lumbar Fusion Lumbar One-Two with Lateral Plate Removal of pedicle screws Lumbar One and Posterior Lateral Fusion Lumbar One-Lumbar Three;  Surgeon: Joshua Alm RAMAN, MD;  Location: [facility] OR;  Service: Neurosurgery;  Laterality: N/A;   BACK SURGERY     CARDIAC CATHETERIZATION     no PCI   CARPAL TUNNEL RELEASE Left 03/04/2018   Procedure: Carpal Tunnel Release - left;  Surgeon: Joshua Alm RAMAN, MD;  Location: [facility] OR;  Service: Neurosurgery;  Laterality: Left;   CHOLECYSTECTOMY     COLONOSCOPY W/ POLYPECTOMY  08/30/2012   Colonic polyp status post polypectomy.  Pancolonic diverticulosis predominantly in the sigmoid colon. Small internal hemorrhoids. Moderate internal hemorhroids.    ESOPHAGOGASTRODUODENOSCOPY  08/04/2016   Schatzkis ring status post esophageal dilatation. Small hiatal hernia. Mild gastritis.    EYE SURGERY     cataract removal bilateral eyes   FOOT SURGERY     left heel   HARDWARE REMOVAL N/A 05/30/2020   Procedure: HARDWARE REMOVAL;  Surgeon: Joshua Alm RAMAN, MD;  Location: [facility] OR;  Service: Neurosurgery;  Laterality: N/A;   HERNIA REPAIR     LAMINECTOMY WITH POSTERIOR LATERAL ARTHRODESIS LEVEL 2 N/A 12/09/2019   Procedure: Laminectomy and Foraminotomy - Lumbar one-two, Lumbar two-three, posterolateral instrumented fusion Lumbar one-three, removal of instrumentation Lumbar three-five;  Surgeon: Joshua Alm RAMAN, MD;  Location: [facility] OR;  Service: Neurosurgery;  Laterality: N/A;   LEFT HEART CATH AND CORONARY ANGIOGRAPHY N/A 08/03/2019   Procedure: LEFT HEART CATH AND CORONARY ANGIOGRAPHY;  Surgeon: Dann Candyce RAMAN, MD;  Location: [facility] INVASIVE CV LAB;  Service: Cardiovascular;  Laterality: N/A;   SHOULDER SURGERY     bilateral   WISDOM TOOTH EXTRACTION      Current Medications: Current Meds  Medication Sig   ACCU-CHEK GUIDE TEST test strip MAY SUBSTITUTE TO ANY MANUFACTURER  COVERED BY PATIENT'S INSURANCE. CHECK SUGAR FASTING.   Accu-Chek Softclix Lancets lancets 1 EACH BY DOES NOT APPLY ROUTE IN THE MORNING, AT NOON, AND AT BEDTIME. MAY SUBSTITUTE TO ANY MANUFACTURER COVERED BY PATIENT'S INSURANCE.   atorvastatin  (LIPITOR) 40 MG tablet TAKE 1 TABLET BY MOUTH EVERY DAY   Blood Glucose Monitoring Suppl DEVI 1 each by Does not apply route in the morning, at noon, and at bedtime. May substitute to any manufacturer covered by patient's insurance.   carboxymethylcellulose (REFRESH PLUS) 0.5 % SOLN Place 1 drop into both eyes 3 (three) times daily as needed for dry eyes (dry/irritated eyes).   cholecalciferol  (VITAMIN D ) 1000 UNITS tablet Take 1,000 Units by mouth in the morning.   COSOPT 2-0.5 % ophthalmic solution 1 drop 2 (two) times daily.   ELIQUIS 5 MG TABS tablet Take 5 mg by mouth 2 (two) times daily.   Fluticasone-Umeclidin-Vilant (TRELEGY ELLIPTA ) 100-62.5-25 MCG/ACT AEPB Inhale 1 puff into the lungs daily.   icosapent  Ethyl (VASCEPA ) 1 g capsule Take 2 capsules (2 g total) by mouth 2 (two) times daily.   latanoprost (XALATAN) 0.005 %  ophthalmic solution 1 drop at bedtime.   lisinopril  (ZESTRIL ) 20 MG tablet TAKE 1 TABLET BY MOUTH EVERY DAY   metFORMIN  (GLUCOPHAGE ) 500 MG tablet TAKE 1 TABLET (500 MG TOTAL) BY MOUTH DAILY.   nitroGLYCERIN  (NITROSTAT ) 0.4 MG SL tablet Place 1 tablet (0.4 mg total) under the tongue every 5 (five) minutes x 3 doses as needed for chest pain.   omeprazole  (PRILOSEC) 40 MG capsule TAKE 1 CAPSULE (40 MG TOTAL) BY MOUTH DAILY.   tamsulosin  (FLOMAX ) 0.4 MG CAPS capsule Take 1 capsule (0.4 mg total) by mouth daily after supper.     Allergies:   Oxycodone    Social History   Socioeconomic History   Marital status: Widowed    Spouse name: Not on file   Number of children: 2   Years of education: Not on file   Highest education level: GED or equivalent  Occupational History   Not on file  Tobacco Use   Smoking status: Former     Current packs/day: 0.00    Average packs/day: 0.5 packs/day for 35.0 years (17.5 ttl pk-yrs)    Types: Cigarettes    Start date: 02/10/1958    Quit date: 02/10/1993    Years since quitting: 30.6   Smokeless tobacco: Former    Types: Chew    Quit date: 02/10/2006  Vaping Use   Vaping status: Never Used  Substance and Sexual Activity   Alcohol  use: Not Currently    Alcohol /week: 1.0 standard drink of alcohol     Types: 1 Cans of beer per week    Comment: occ   Drug use: No   Sexual activity: Yes    Partners: Female  Other Topics Concern   Not on file  Social History Narrative   Not on file   Social Drivers of Health   Financial Resource Strain: Low Risk  (03/07/2023)   Overall Financial Resource Strain (CARDIA)    Difficulty of Paying Living Expenses: Not hard at all  Food Insecurity: Low Risk  (04/15/2023)   Received from [facility]   Hunger Vital Sign    Within the past 12 months, you worried that your food would run out before you got money to buy more: Never true    Within the past 12 months, the food you bought just didn't last and you didn't have money to get more. : Never true  Transportation Needs: Unmet Transportation Needs (04/15/2023)   Received from [employer]    In the past 12 months, has lack of reliable transportation kept you from medical appointments, meetings, work or from getting things needed for daily living? : Yes  Physical Activity: Inactive (03/07/2023)   Exercise Vital Sign    Days of Exercise per Week: 0 days    Minutes of Exercise per Session: 0 min  Stress: Stress Concern Present (03/07/2023)   [employer] of Occupational Health - Occupational Stress Questionnaire    Feeling of Stress : To some extent  Social Connections: Moderately Integrated (03/07/2023)   Social Connection and Isolation Panel    Frequency of Communication with Friends and Family: More than three times a week    Frequency of Social Gatherings with Friends and  Family: More than three times a week    Attends Religious Services: More than 4 times per year    Active Member of [employer] or Organizations: Yes    Attends Banker Meetings: Patient declined    Marital Status: Widowed  Recent Concern: Social Connections -  Moderately Isolated (01/15/2023)   Social Connection and Isolation Panel    Frequency of Communication with Friends and Family: More than three times a week    Frequency of Social Gatherings with Friends and Family: More than three times a week    Attends Religious Services: More than 4 times per year    Active Member of [employer] or Organizations: No    Attends Banker Meetings: Patient declined    Marital Status: Widowed     Family History: The patient's family history is negative for Colon cancer, Esophageal cancer, Rectal cancer, Stomach cancer, and Colon polyps. ROS:   Please see the history of present illness.    All 14 point review of systems negative except as described per history of present illness  EKGs/Labs/Other Studies Reviewed:         Recent Labs: 03/30/2023: ALT 13; Hemoglobin 14.8; Platelets 193 08/10/2023: BUN 22; Creatinine, Ser 1.27; NT-Pro BNP 234; Potassium 4.5; Sodium 141  Recent Lipid Panel    Component Value Date/Time   CHOL 103 03/30/2023 0811   TRIG 76 03/30/2023 0811   HDL 40 03/30/2023 0811   CHOLHDL 2.6 03/30/2023 0811   CHOLHDL 2.6 01/24/2017 0521   VLDL 15 01/24/2017 0521   LDLCALC 47 03/30/2023 0811    Physical Exam:    VS:  BP 126/86 (BP Location: Right Arm, Patient Position: Sitting, Cuff Size: Normal)   Pulse 74   Ht 5' 11 (1.803 m)   Wt 180 lb 12.8 oz (82 kg)   SpO2 97%   BMI 25.22 kg/m     Wt Readings from Last 3 Encounters:  09/22/23 180 lb 12.8 oz (82 kg)  08/10/23 181 lb 9.6 oz (82.4 kg)  08/03/23 183 lb (83 kg)     GEN:  Well nourished, well developed in no acute distress HEENT: Normal NECK: No JVD; No carotid bruits LYMPHATICS: No  lymphadenopathy CARDIAC: RRR, no murmurs, no rubs, no gallops RESPIRATORY:  Clear to auscultation without rales, wheezing or rhonchi  ABDOMEN: Soft, non-tender, non-distended MUSCULOSKELETAL:  No edema; No deformity  SKIN: Warm and dry LOWER EXTREMITIES: no swelling NEUROLOGIC:  Alert and oriented x 3 PSYCHIATRIC:  Normal affect   ASSESSMENT:    1. Coronary artery disease involving native coronary artery of native heart without angina pectoris   2. Malignant neoplasm of overlapping sites of bladder (HCC)   3. Mixed hyperlipidemia    PLAN:    In order of problems listed above:  Coronary disease stable from that point review my goal was if he is not improving symptomatically to do stress test however he is much better right now he said he can walk around to do things with no difficulties therefore, we will continue present management. Malignant neoplasm of the bladder.  Immunotherapy followed by urology. Dyslipidemia I did review K PN which show me LDL 47 HDL 40 continue present management   Medication Adjustments/Labs and Tests Ordered: Current medicines are reviewed at length with the patient today.  Concerns regarding medicines are outlined above.  No orders of the defined types were placed in this encounter.  Medication changes: No orders of the defined types were placed in this encounter.   Signed, Lamar DOROTHA Fitch, MD, [facility] 09/22/2023 8:24 AM    [facility]

## 2023-09-22 NOTE — Patient Instructions (Signed)
 Medication Instructions:  Your physician recommends that you continue on your current medications as directed. Please refer to the Current Medication list given to you today.  *If you need a refill on your cardiac medications before your next appointment, please call your pharmacy*   Lab Work: None Ordered If you have labs (blood work) drawn today and your tests are completely normal, you will receive your results only by: MyChart Message (if you have MyChart) OR A paper copy in the mail If you have any lab test that is abnormal or we need to change your treatment, we will call you to review the results.   Testing/Procedures: None Ordered   Follow-Up: At [facility], you and your health needs are our priority.  As part of our continuing mission to provide you with exceptional heart care, we have created designated Provider Care Teams.  These Care Teams include your primary Cardiologist (physician) and Advanced Practice Providers (APPs -  Physician Assistants and Nurse Practitioners) who all work together to provide you with the care you need, when you need it.  We recommend signing up for the patient portal called MyChart.  Sign up information is provided on this After Visit Summary.  MyChart is used to connect with patients for Virtual Visits (Telemedicine).  Patients are able to view lab/test results, encounter notes, upcoming appointments, etc.  Non-urgent messages can be sent to your provider as well.   To learn more about what you can do with MyChart, go to [email or web address].    Your next appointment:   5 month(s)  The format for your next appointment:   In Person  Provider:   Lamar Fitch, MD    Other Instructions NA

## 2023-10-01 ENCOUNTER — Other Ambulatory Visit: Payer: Self-pay | Admitting: Family Medicine

## 2023-10-01 DIAGNOSIS — I10 Essential (primary) hypertension: Secondary | ICD-10-CM

## 2023-10-05 DIAGNOSIS — Z9049 Acquired absence of other specified parts of digestive tract: Secondary | ICD-10-CM | POA: Diagnosis not present

## 2023-10-05 DIAGNOSIS — Z85528 Personal history of other malignant neoplasm of kidney: Secondary | ICD-10-CM | POA: Diagnosis not present

## 2023-10-05 DIAGNOSIS — Z905 Acquired absence of kidney: Secondary | ICD-10-CM | POA: Diagnosis not present

## 2023-10-05 DIAGNOSIS — I700 Atherosclerosis of aorta: Secondary | ICD-10-CM | POA: Diagnosis not present

## 2023-10-05 DIAGNOSIS — K769 Liver disease, unspecified: Secondary | ICD-10-CM | POA: Diagnosis not present

## 2023-10-19 DIAGNOSIS — C678 Malignant neoplasm of overlapping sites of bladder: Secondary | ICD-10-CM | POA: Diagnosis not present

## 2023-10-19 DIAGNOSIS — Z5112 Encounter for antineoplastic immunotherapy: Secondary | ICD-10-CM | POA: Diagnosis not present

## 2023-10-19 DIAGNOSIS — C642 Malignant neoplasm of left kidney, except renal pelvis: Secondary | ICD-10-CM | POA: Diagnosis not present

## 2023-10-25 ENCOUNTER — Other Ambulatory Visit: Payer: Self-pay | Admitting: Family Medicine

## 2023-11-05 DIAGNOSIS — H353132 Nonexudative age-related macular degeneration, bilateral, intermediate dry stage: Secondary | ICD-10-CM | POA: Diagnosis not present

## 2023-11-05 DIAGNOSIS — H524 Presbyopia: Secondary | ICD-10-CM | POA: Diagnosis not present

## 2023-11-05 DIAGNOSIS — Z961 Presence of intraocular lens: Secondary | ICD-10-CM | POA: Diagnosis not present

## 2023-11-05 DIAGNOSIS — H43393 Other vitreous opacities, bilateral: Secondary | ICD-10-CM | POA: Diagnosis not present

## 2023-11-05 DIAGNOSIS — H401132 Primary open-angle glaucoma, bilateral, moderate stage: Secondary | ICD-10-CM | POA: Diagnosis not present

## 2023-11-08 NOTE — Assessment & Plan Note (Signed)
 SABRA

## 2023-11-08 NOTE — Assessment & Plan Note (Signed)
 [name]

## 2023-11-08 NOTE — Progress Notes (Signed)
 "  Subjective:  Patient ID: [name], male    DOB: 1940-04-23  Age: 83 y.o. MRN: [id number]  Chief Complaint  Patient presents with   Medical Management of Chronic Issues    HPI: Discussed the use of AI scribe software for clinical note transcription with the patient, who gave verbal consent to proceed.  History of Present Illness [name] is an 83 year old male with a history of cancer who presents for chronic follow up.   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  - Initial CT scan revealed three small hepatic lesions; no new findings on subsequent imaging - Receiving treatments from oncology (not chemo, sounds like immunotherapy) - Since starting different treatment he has developed a pruritic rash all over.  - Given triamcinalone cream, which gives temporary relief. No antihistamines tried.    Chronic disease management - Hypertension managed with lisinopril  20 mg once daily - Hyperlipidemia managed with Vascepa  1 gram twice daily and atorvastatin  40 mg once daily - Gastroesophageal reflux disease managed with omeprazole  40 mg once daily - Chronic obstructive pulmonary disease has Trelegy but not using. Has no issues with his breathing.  - Benign prostatic hyperplasia managed with tamsulosin  - Glaucoma managed with multiple eye drops - Atrial fibrillation managed with Eliquis 5 mg twice daily - Prediabetes managed with metformin  500 mg once daily; A1c remains below 6.5  Constitutional and systemic symptoms - No fevers, chills, or sweats - No earaches, sore throat, or nasal congestion - No chest pain or respiratory distress - No gastrointestinal or urinary symptoms  Lifestyle and functional status - Attempts to maintain a healthy diet, including vegetables and fish - Physical activity less than  previous levels        07/29/2023    9:26 AM 03/30/2023    7:33 AM 01/15/2023    1:07 PM 08/20/2022    7:27 AM 07/03/2022    1:54 PM  Depression screen PHQ 2/9  Decreased Interest 0 0 0 0 0  Down, Depressed, Hopeless 0 0 0 0 0  PHQ - 2 Score 0 0 0 0 0  Altered sleeping   0    Tired, decreased energy   0    Change in appetite   0    Feeling bad or failure about yourself    0    Trouble concentrating   0    Moving slowly or fidgety/restless   0    Suicidal thoughts   0    PHQ-9 Score   0    Difficult doing work/chores   Not difficult at all          03/30/2023    7:33 AM  Fall Risk   Falls in the past year? 1  Number falls in past yr: 1  Injury with Fall? 0  Risk for fall due to : No Fall Risks    Patient Care Team: Sherre Clapper, MD as PCP - General (Family Medicine) Revankar, Jennifer SAUNDERS, MD as Consulting Physician (Cardiology) Dawley, Lani BROCKS, DO (Inactive) as Consulting Physician Joshua Alm Hamilton, MD as Consulting Physician (Neurosurgery)   Review of Systems  Constitutional:  Negative for chills, fatigue and fever.  HENT:  Negative for congestion, ear pain and sore throat.   Respiratory:  Negative for cough and shortness of breath.   Cardiovascular:  Negative for  chest pain.  Gastrointestinal:  Negative for abdominal pain, constipation, diarrhea, nausea and vomiting.  Endocrine: Negative for polydipsia, polyphagia and polyuria.  Genitourinary:  Negative for dysuria and frequency.  Musculoskeletal:  Negative for arthralgias and myalgias.  Skin:  Positive for rash.  Neurological:  Negative for dizziness and headaches.  Psychiatric/Behavioral:  Negative for dysphoric mood.        No dysphoria    Current Outpatient Medications on File Prior to Visit  Medication Sig Dispense Refill   ACCU-CHEK GUIDE TEST test strip MAY SUBSTITUTE TO ANY MANUFACTURER COVERED BY PATIENT'S INSURANCE. CHECK SUGAR FASTING. 100 strip 3   Accu-Chek Softclix Lancets lancets 1 EACH BY DOES NOT  APPLY ROUTE IN THE MORNING, AT NOON, AND AT BEDTIME. MAY SUBSTITUTE TO ANY MANUFACTURER COVERED BY PATIENT'S INSURANCE. 100 each 3   atorvastatin  (LIPITOR) 40 MG tablet TAKE 1 TABLET BY MOUTH EVERY DAY 90 tablet 1   Blood Glucose Monitoring Suppl DEVI 1 each by Does not apply route in the morning, at noon, and at bedtime. May substitute to any manufacturer covered by patient's insurance. 1 each 0   carboxymethylcellulose (REFRESH PLUS) 0.5 % SOLN Place 1 drop into both eyes 3 (three) times daily as needed for dry eyes (dry/irritated eyes).     cholecalciferol  (VITAMIN D ) 1000 UNITS tablet Take 1,000 Units by mouth in the morning.     COSOPT 2-0.5 % ophthalmic solution 1 drop 2 (two) times daily.     ELIQUIS 5 MG TABS tablet Take 5 mg by mouth 2 (two) times daily.     Fluticasone-Umeclidin-Vilant (TRELEGY ELLIPTA ) 100-62.5-25 MCG/ACT AEPB Inhale 1 puff into the lungs daily. 180 each 11   icosapent  Ethyl (VASCEPA ) 1 g capsule Take 2 capsules (2 g total) by mouth 2 (two) times daily. 360 capsule 1   latanoprost (XALATAN) 0.005 % ophthalmic solution 1 drop at bedtime.     lisinopril  (ZESTRIL ) 20 MG tablet TAKE 1 TABLET BY MOUTH EVERY DAY 90 tablet 1   metFORMIN  (GLUCOPHAGE ) 500 MG tablet TAKE 1 TABLET (500 MG TOTAL) BY MOUTH DAILY. 90 tablet 1   nitroGLYCERIN  (NITROSTAT ) 0.4 MG SL tablet Place 1 tablet (0.4 mg total) under the tongue every 5 (five) minutes x 3 doses as needed for chest pain. 25 tablet 12   omeprazole  (PRILOSEC) 40 MG capsule TAKE 1 CAPSULE (40 MG TOTAL) BY MOUTH DAILY. 90 capsule 1   tamsulosin  (FLOMAX ) 0.4 MG CAPS capsule Take 1 capsule (0.4 mg total) by mouth daily after supper. 90 capsule 1   triamcinolone  cream (KENALOG ) 0.1 % Apply 1 Application topically 2 (two) times daily.     No current facility-administered medications on file prior to visit.   Past Medical History:  Diagnosis Date   Angina pectoris 08/02/2019   Arthritis    on meds   Atrial fibrillation (HCC)     Backache 08/17/2012   Body mass index (BMI) 26.0-26.9, adult 05/08/2020   Cancer (HCC) 10/2015   prostate cancer treated with radiation   Carpal tunnel syndrome 01/18/2018   Cataract    sx-bilaterally   Cervical spondylosis without myelopathy 09/24/2015   Chest tightness 06/28/2019   Chronic back pain 05/30/2013   Chronic low back pain 02/20/2014   Colitis 11/22/2018   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 06/17/2016   Deficiency of other specified B group vitamins 05/20/2019   Depression 04/04/2019   Diabetes mellitus due to underlying condition with unspecified complications (HCC) 06/28/2019   Diabetes mellitus  without complication (HCC)    Type II   Diarrhea 11/21/2018   Dyslipidemia 06/17/2016   Dyspnea on exertion 06/28/2019   Dysrhythmia    Essential (primary) hypertension 06/17/2016   Essential hypertension 06/17/2016   GERD (gastroesophageal reflux disease)    on meds   Greater trochanteric pain syndrome 07/03/2020   Headache    History of hiatal hernia    History of kidney stones    History of prostate cancer 05/20/2019   Hypercholesteremia 04/04/2019   Hyperlipemia    Hypertension    Impaired fasting blood sugar 05/20/2019   Impingement syndrome of shoulder region 01/12/2018   Impingement syndrome of shoulder region 01/12/2018   Low back pain 02/20/2014   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   Lumbar radiculopathy 05/24/2019   Malignant neoplasm prostate (HCC) 05/20/2019   Medicare annual wellness visit, subsequent 01/16/2020   Mixed hyperlipidemia 08/31/2019   Neck pain 07/03/2015   Paresthesia of hand 01/12/2018   Paresthesia of upper limb 01/12/2018   Paroxysmal atrial fibrillation (HCC) 04/04/2019   PONV (postoperative nausea and vomiting)    Pyelonephritis 06/08/2021   S/P cervical spinal fusion 03/04/2018   S/P lumbar fusion 12/09/2019   S/P lumbar spinal fusion 09/29/2013   S/P lumbar spinal fusion 09/29/2013   Sepsis (HCC) 11/22/2018   Shoulder pain  01/12/2018   Spinal 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 01/26/2018   Stenosis of intervertebral foramina 01/12/2018   Trochanteric bursitis of right hip 04/19/2020   Past Surgical History:  Procedure Laterality Date   anal fissures     ANTERIOR CERVICAL DECOMP/DISCECTOMY FUSION N/A 03/04/2018   Procedure: Anterior Cervical Decompression Fusion - Cervical three-Cervical four - Cervical four-Cervical five;  Surgeon: Joshua Alm RAMAN, MD;  Location: [facility] OR;  Service: Neurosurgery;  Laterality: N/A;   ANTERIOR LAT LUMBAR FUSION N/A 05/30/2020   Procedure: Anterior Lateral Lumbar Fusion Lumbar One-Two with Lateral Plate Removal of pedicle screws Lumbar One and Posterior Lateral Fusion Lumbar One-Lumbar Three;  Surgeon: Joshua Alm RAMAN, MD;  Location: [facility] OR;  Service: Neurosurgery;  Laterality: N/A;   BACK SURGERY     CARDIAC CATHETERIZATION     no PCI   CARPAL TUNNEL RELEASE Left 03/04/2018   Procedure: Carpal Tunnel Release - left;  Surgeon: Joshua Alm RAMAN, MD;  Location: [facility] OR;  Service: Neurosurgery;  Laterality: Left;   CHOLECYSTECTOMY     COLONOSCOPY W/ POLYPECTOMY  08/30/2012   Colonic polyp status post polypectomy.  Pancolonic diverticulosis predominantly in the sigmoid colon. Small internal hemorrhoids. Moderate internal hemorhroids.    ESOPHAGOGASTRODUODENOSCOPY  08/04/2016   Schatzkis ring status post esophageal dilatation. Small hiatal hernia. Mild gastritis.    EYE SURGERY     cataract removal bilateral eyes   FOOT SURGERY     left heel   HARDWARE REMOVAL N/A 05/30/2020   Procedure: HARDWARE REMOVAL;  Surgeon: Joshua Alm RAMAN, MD;  Location: [facility] OR;  Service: Neurosurgery;  Laterality: N/A;   HERNIA REPAIR     LAMINECTOMY WITH POSTERIOR LATERAL ARTHRODESIS LEVEL 2 N/A 12/09/2019   Procedure: Laminectomy and Foraminotomy - Lumbar one-two, Lumbar two-three, posterolateral instrumented fusion Lumbar one-three, removal of instrumentation Lumbar three-five;  Surgeon: Joshua Alm RAMAN, MD;  Location: [facility]  OR;  Service: Neurosurgery;  Laterality: N/A;   LEFT HEART CATH AND CORONARY ANGIOGRAPHY N/A 08/03/2019   Procedure: LEFT HEART CATH AND CORONARY ANGIOGRAPHY;  Surgeon: Dann Candyce RAMAN, MD;  Location: [facility] INVASIVE CV LAB;  Service: Cardiovascular;  Laterality: N/A;   SHOULDER SURGERY  bilateral   WISDOM TOOTH EXTRACTION      Family History  Problem Relation Age of Onset   Colon cancer Neg Hx    Esophageal cancer Neg Hx    Rectal cancer Neg Hx    Stomach cancer Neg Hx    Colon polyps Neg Hx    Social History   Socioeconomic History   Marital status: Widowed    Spouse name: Not on file   Number of children: 2   Years of education: Not on file   Highest education level: GED or equivalent  Occupational History   Not on file  Tobacco Use   Smoking status: Former    Current packs/day: 0.00    Average packs/day: 0.5 packs/day for 35.0 years (17.5 ttl pk-yrs)    Types: Cigarettes    Start date: 02/10/1958    Quit date: 02/10/1993    Years since quitting: 30.7   Smokeless tobacco: Former    Types: Chew    Quit date: 02/10/2006  Vaping Use   Vaping status: Never Used  Substance and Sexual Activity   Alcohol  use: Not Currently    Alcohol /week: 1.0 standard drink of alcohol     Types: 1 Cans of beer per week    Comment: occ   Drug use: No   Sexual activity: Yes    Partners: Female  Other Topics Concern   Not on file  Social History Narrative   Not on file   Social Drivers of Health   Financial Resource Strain: Low Risk  (03/07/2023)   Overall Financial Resource Strain (CARDIA)    Difficulty of Paying Living Expenses: Not hard at all  Food Insecurity: Low Risk  (04/15/2023)   Received from [facility]   Hunger Vital Sign    Within the past 12 months, you worried that your food would run out before you got money to buy more: Never true    Within the past 12 months, the food you bought just didn't last and you didn't have money to get more. : Never true  Transportation  Needs: Unmet Transportation Needs (04/15/2023)   Received from [facility]    In the past 12 months, has lack of reliable transportation kept you from medical appointments, meetings, work or from getting things needed for daily living? : Yes  Physical Activity: Inactive (03/07/2023)   Exercise Vital Sign    Days of Exercise per Week: 0 days    Minutes of Exercise per Session: 0 min  Stress: Stress Concern Present (03/07/2023)   [employer] of Occupational Health - Occupational Stress Questionnaire    Feeling of Stress : To some extent  Social Connections: Moderately Integrated (03/07/2023)   Social Connection and Isolation Panel    Frequency of Communication with Friends and Family: More than three times a week    Frequency of Social Gatherings with Friends and Family: More than three times a week    Attends Religious Services: More than 4 times per year    Active Member of [employer] or Organizations: Yes    Attends Banker Meetings: Patient declined    Marital Status: Widowed  Recent Concern: Social Connections - Moderately Isolated (01/15/2023)   Social Connection and Isolation Panel    Frequency of Communication with Friends and Family: More than three times a week    Frequency of Social Gatherings with Friends and Family: More than three times a week    Attends Religious Services: More than 4 times per  year    Active Member of Clubs or Organizations: No    Attends Banker Meetings: Patient declined    Marital Status: Widowed    Objective:  BP 136/72   Pulse 74   Temp 98.2 F (36.8 C)   Ht 5' 11 (1.803 m)   Wt 182 lb (82.6 kg)   SpO2 97%   BMI 25.38 kg/m      11/10/2023    8:13 AM 09/22/2023    8:08 AM 08/10/2023    2:40 PM  BP/Weight  Systolic BP 136 126 126  Diastolic BP 72 86 70  Wt. (Lbs) 182 180.8 181.6  BMI 25.38 kg/m2 25.22 kg/m2 25.33 kg/m2    Physical Exam Vitals reviewed.  Constitutional:      Appearance:  Normal appearance.  Neck:     Vascular: No carotid bruit.  Cardiovascular:     Rate and Rhythm: Normal rate and regular rhythm.     Heart sounds: Normal heart sounds.  Pulmonary:     Effort: Pulmonary effort is normal.     Breath sounds: Normal breath sounds. No wheezing, rhonchi or rales.  Abdominal:     General: Bowel sounds are normal.     Palpations: Abdomen is soft.     Tenderness: There is no abdominal tenderness.  Skin:    Findings: Rash (diffuse macular rash) present.  Neurological:     Mental Status: He is alert.  Psychiatric:        Mood and Affect: Mood normal.        Behavior: Behavior normal.         Lab Results  Component Value Date   WBC 8.2 03/30/2023   HGB 14.8 03/30/2023   HCT 43.9 03/30/2023   PLT 193 03/30/2023   GLUCOSE 80 08/10/2023   CHOL 103 03/30/2023   TRIG 76 03/30/2023   HDL 40 03/30/2023   LDLCALC 47 03/30/2023   ALT 13 03/30/2023   AST 18 03/30/2023   NA 141 08/10/2023   K 4.5 08/10/2023   CL 105 08/10/2023   CREATININE 1.27 08/10/2023   BUN 22 08/10/2023   CO2 18 (L) 08/10/2023   TSH 2.880 04/25/2022   INR 0.9 05/29/2020   HGBA1C 5.6 11/10/2023    Results for orders placed or performed in visit on 11/10/23  POCT Lipid Panel   Collection Time: 11/10/23  8:31 AM  Result Value Ref Range   TC 112    HDL 40    TRG 119    LDL 48    Non-HDL 72    TC/HDL    POCT glycosylated hemoglobin (Hb A1C)   Collection Time: 11/10/23  8:31 AM  Result Value Ref Range   Hemoglobin A1C     HbA1c POC (<> result, manual entry) 5.6 4.0 - 5.6 %   HbA1c, POC (prediabetic range)     HbA1c, POC (controlled diabetic range)    .  Assessment & Plan:   Assessment & Plan Coronary artery disease involving native coronary artery of native heart without angina pectoris Non obstructive coronary artery disease.  Continue statin.      Mixed hyperlipidemia At goal On atorvastatin  and Vascepa  for management of mixed hyperlipidemia. - Check  cholesterol levels. Impaired fasting blood sugar A1c levels consistent with prediabetes, not exceeding 6.4. Advised to maintain healthy diet and exercise to manage condition. - Check A1c levels. - Encourage healthy diet and exercise. Orders:   POCT glycosylated hemoglobin (Hb A1C)  Paroxysmal atrial fibrillation (HCC)  On Eliquis for anticoagulation management of atrial fibrillation. No recent episodes of chest pain or other related symptoms reported. - Continue Eliquis as prescribed.    Gastroesophageal reflux disease without esophagitis Well controlled.  Continue Omeprazole  40 mg daily.     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 Status post nephroureterectomy. Recent CT scans show no new lesions. Reports rash likely secondary to cancer therapy.    Encounter for immunization  Orders:   Flu vaccine HIGH DOSE PF(Fluzone Trivalent)  Encounter for immunization  Orders:   Pfizer Comirnaty Covid-19 Vaccine 63yrs & older  Rash Pruritic rash likely secondary to cancer therapy Reports pruritic rash since starting current cancer treatment. Triamcinolone  acetonide cream prescribed, providing temporary relief. - Recommend Zyrtec for itching. - Provide samples of Zyrtec if available.      Body mass index is 25.38 kg/m.   No orders of the defined types were placed in this encounter.   Orders Placed This Encounter  Procedures   Flu vaccine HIGH DOSE PF(Fluzone Trivalent)   Pfizer Comirnaty Covid-19 Vaccine 50yrs & older   POCT Lipid Panel   POCT glycosylated hemoglobin (Hb A1C)        Follow-up: Return in about 6 months (around 05/09/2024) for chronic follow up.  An After Visit Summary was printed and given to the patient.  Abigail Free, MD [facility] [phone] "

## 2023-11-10 ENCOUNTER — Ambulatory Visit (INDEPENDENT_AMBULATORY_CARE_PROVIDER_SITE_OTHER): Admitting: Family Medicine

## 2023-11-10 ENCOUNTER — Encounter: Payer: Self-pay | Admitting: Family Medicine

## 2023-11-10 VITALS — BP 136/72 | HR 74 | Temp 98.2°F | Ht 71.0 in | Wt 182.0 lb

## 2023-11-10 DIAGNOSIS — Z23 Encounter for immunization: Secondary | ICD-10-CM

## 2023-11-10 DIAGNOSIS — R7301 Impaired fasting glucose: Secondary | ICD-10-CM | POA: Diagnosis not present

## 2023-11-10 DIAGNOSIS — K219 Gastro-esophageal reflux disease without esophagitis: Secondary | ICD-10-CM

## 2023-11-10 DIAGNOSIS — R21 Rash and other nonspecific skin eruption: Secondary | ICD-10-CM | POA: Diagnosis not present

## 2023-11-10 DIAGNOSIS — I480 Paroxysmal atrial fibrillation: Secondary | ICD-10-CM

## 2023-11-10 DIAGNOSIS — C678 Malignant neoplasm of overlapping sites of bladder: Secondary | ICD-10-CM

## 2023-11-10 DIAGNOSIS — C61 Malignant neoplasm of prostate: Secondary | ICD-10-CM

## 2023-11-10 DIAGNOSIS — I2510 Atherosclerotic heart disease of native coronary artery without angina pectoris: Secondary | ICD-10-CM

## 2023-11-10 DIAGNOSIS — E782 Mixed hyperlipidemia: Secondary | ICD-10-CM

## 2023-11-10 LAB — POCT GLYCOSYLATED HEMOGLOBIN (HGB A1C): HbA1c POC (<> result, manual entry): 5.6 % (ref 4.0–5.6)

## 2023-11-10 LAB — POCT LIPID PANEL
HDL: 40
LDL: 48
Non-HDL: 72
TC: 112
TRG: 119

## 2023-11-10 NOTE — Patient Instructions (Signed)
  VISIT SUMMARY: You visited today to discuss a rash and itching that started after your recent cancer treatment. We also reviewed your ongoing cancer treatment, chronic conditions, and overall health maintenance.  YOUR PLAN: PRURITIC RASH: You have a rash and itching likely caused by your cancer treatment. -Continue using Triamcinolone  cream for relief. -Start taking Zyrtec for itching. Samples will be provided if available.   ATRIAL FIBRILLATION: You are on Eliquis to manage your atrial fibrillation and have not reported any recent symptoms. -Continue taking Eliquis as prescribed.  ATHEROSCLEROTIC HEART DISEASE AND MIXED HYPERLIPIDEMIA: You are taking atorvastatin  and Vascepa  to manage your cholesterol levels. -Continue taking atorvastatin  and Vascepa  as prescribed. -cholesterol good.   PREDIABETES: Your A1c levels indicate prediabetes. You are managing this with a healthy diet and exercise. -Continue maintaining a healthy diet and exercise.  GASTROESOPHAGEAL REFLUX DISEASE (GERD): You are taking omeprazole  for GERD and have not reported any recent symptoms. -Continue taking omeprazole  as prescribed.                      Contains text generated by [employer]bridge.                                 Contains text generated by Abridge.

## 2023-11-10 NOTE — Assessment & Plan Note (Addendum)
 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 Status post nephroureterectomy. Recent CT scans show no new lesions. Reports rash likely secondary to cancer therapy.

## 2023-11-16 DIAGNOSIS — L309 Dermatitis, unspecified: Secondary | ICD-10-CM | POA: Diagnosis not present

## 2023-11-16 DIAGNOSIS — C678 Malignant neoplasm of overlapping sites of bladder: Secondary | ICD-10-CM | POA: Diagnosis not present

## 2023-11-16 DIAGNOSIS — C642 Malignant neoplasm of left kidney, except renal pelvis: Secondary | ICD-10-CM | POA: Diagnosis not present

## 2023-11-16 DIAGNOSIS — Z5112 Encounter for antineoplastic immunotherapy: Secondary | ICD-10-CM | POA: Diagnosis not present

## 2023-12-14 DIAGNOSIS — C642 Malignant neoplasm of left kidney, except renal pelvis: Secondary | ICD-10-CM | POA: Diagnosis not present

## 2024-01-01 ENCOUNTER — Ambulatory Visit (HOSPITAL_BASED_OUTPATIENT_CLINIC_OR_DEPARTMENT_OTHER)
Admission: EM | Admit: 2024-01-01 | Discharge: 2024-01-01 | Disposition: A | Discharge status: Home / Self Care | Attending: Family Medicine | Admitting: Family Medicine

## 2024-01-01 ENCOUNTER — Ambulatory Visit (HOSPITAL_BASED_OUTPATIENT_CLINIC_OR_DEPARTMENT_OTHER): Admitting: Radiology

## 2024-01-01 ENCOUNTER — Encounter (HOSPITAL_BASED_OUTPATIENT_CLINIC_OR_DEPARTMENT_OTHER): Payer: Self-pay

## 2024-01-01 ENCOUNTER — Other Ambulatory Visit: Payer: Self-pay | Admitting: Family Medicine

## 2024-01-01 ENCOUNTER — Ambulatory Visit (HOSPITAL_BASED_OUTPATIENT_CLINIC_OR_DEPARTMENT_OTHER): Payer: Self-pay | Admitting: Family Medicine

## 2024-01-01 DIAGNOSIS — M25421 Effusion, right elbow: Secondary | ICD-10-CM | POA: Diagnosis not present

## 2024-01-01 DIAGNOSIS — M7031 Other bursitis of elbow, right elbow: Secondary | ICD-10-CM | POA: Diagnosis not present

## 2024-01-01 DIAGNOSIS — K219 Gastro-esophageal reflux disease without esophagitis: Secondary | ICD-10-CM

## 2024-01-01 NOTE — Discharge Instructions (Addendum)
 I believe this is bursitis.  Recommendations are to wear compression to the area to help with the swelling.  You can rub some Voltaren gel on the area. Try to rest the joint is much as possible.  You can ice the area 3-4 times a day.  Elevate the arm. You may want to follow-up with orthopedic.  Sometimes they can aspirate the fluid in the elbow and put a steroid injection and also.

## 2024-01-01 NOTE — ED Provider Notes (Signed)
 [facility]    CSN: [id number] Arrival date & time: 01/01/24  1001      History   Chief Complaint Chief Complaint  Patient presents with   Elbow Pain    HPI [name] is a 83 y.o. male.   Patient is a 83 year old male who presents tonight with right elbow swelling.  Right elbow sweling x approx 1 month. No redness. Patient states noticed one day pain only felt when resting the arm on things.  No pain just sitting.  Denies any injuries.      Past Medical History:  Diagnosis Date   Angina pectoris 08/02/2019   Arthritis    on meds   Atrial fibrillation (HCC)    Backache 08/17/2012   Body mass index (BMI) 26.0-26.9, adult 05/08/2020   Cancer (HCC) 10/2015   prostate cancer treated with radiation   Carpal tunnel syndrome 01/18/2018   Cataract    sx-bilaterally   Cervical spondylosis without myelopathy 09/24/2015   Chest tightness 06/28/2019   Chronic back pain 05/30/2013   Chronic low back pain 02/20/2014   Colitis 11/22/2018   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 06/17/2016   Deficiency of other specified B group vitamins 05/20/2019   Depression 04/04/2019   Diabetes mellitus due to underlying condition with unspecified complications (HCC) 06/28/2019   Diabetes mellitus without complication (HCC)    Type II   Diarrhea 11/21/2018   Dyslipidemia 06/17/2016   Dyspnea on exertion 06/28/2019   Dysrhythmia    Essential (primary) hypertension 06/17/2016   Essential hypertension 06/17/2016   GERD (gastroesophageal reflux disease)    on meds   Greater trochanteric pain syndrome 07/03/2020   Headache    History of hiatal hernia    History of kidney stones    History of prostate cancer 05/20/2019   Hypercholesteremia 04/04/2019   Hyperlipemia    Hypertension    Impaired fasting blood sugar 05/20/2019   Impingement syndrome of shoulder region 01/12/2018   Impingement syndrome of shoulder region 01/12/2018   Low back pain 02/20/2014    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   Lumbar radiculopathy 05/24/2019   Malignant neoplasm prostate (HCC) 05/20/2019   Medicare annual wellness visit, subsequent 01/16/2020   Mixed hyperlipidemia 08/31/2019   Neck pain 07/03/2015   Paresthesia of hand 01/12/2018   Paresthesia of upper limb 01/12/2018   Paroxysmal atrial fibrillation (HCC) 04/04/2019   PONV (postoperative nausea and vomiting)    Pyelonephritis 06/08/2021   S/P cervical spinal fusion 03/04/2018   S/P lumbar fusion 12/09/2019   S/P lumbar spinal fusion 09/29/2013   S/P lumbar spinal fusion 09/29/2013   Sepsis (HCC) 11/22/2018   Shoulder pain 01/12/2018   Spinal 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 01/26/2018   Stenosis of intervertebral foramina 01/12/2018   Trochanteric bursitis of right hip 04/19/2020    Patient Active Problem List   Diagnosis Date Noted   Dehydration 08/06/2023   Centrilobular emphysema (HCC) 08/06/2023   DM type 2 with diabetic mixed hyperlipidemia (HCC) 08/06/2023   Irregular heart rhythm 07/29/2023   Diabetes mellitus without complication (HCC) 07/29/2023   Memory loss 07/29/2023   Bradycardia 07/29/2023   Dizzy 07/29/2023   Plantar fasciitis of right foot 04/29/2023   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/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   Postoperative examination 07/16/2022   Lipoma of neck 07/03/2022   Bladder tumor 02/06/2022   Urge incontinence 09/29/2021   Muscle cramp 09/29/2021   Hypertensive heart disease with chronic diastolic  congestive heart failure (HCC) 03/27/2021   Malignant neoplasm of overlapping sites of bladder (HCC) 03/11/2021   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   Deviated nasal septum 12/25/2020   Hypertrophy of inferior nasal turbinate 12/25/2020   Arthritis    Cataract    Dysrhythmia    GERD (gastroesophageal reflux disease)    History of hiatal hernia    History of kidney stones    PONV (postoperative nausea and vomiting)    S/P  lumbar fusion 12/09/2019   Mixed hyperlipidemia 08/31/2019   Angina pectoris 08/02/2019   Deficiency of other specified B group vitamins 05/20/2019   Impaired fasting blood sugar 05/20/2019   History of prostate cancer 05/20/2019   Malignant neoplasm prostate (HCC) 05/20/2019   Depression 04/04/2019   Paroxysmal atrial fibrillation (HCC) 04/04/2019   Colitis 11/22/2018   S/P cervical spinal fusion 03/04/2018   Carpal tunnel syndrome 01/18/2018   Acute pain of right shoulder 01/12/2018   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 06/17/2016   Cervical spondylosis without myelopathy 09/24/2015    Past Surgical History:  Procedure Laterality Date   anal fissures     ANTERIOR CERVICAL DECOMP/DISCECTOMY FUSION N/A 03/04/2018   Procedure: Anterior Cervical Decompression Fusion - Cervical three-Cervical four - Cervical four-Cervical five;  Surgeon: Joshua Alm RAMAN, MD;  Location: [facility] OR;  Service: Neurosurgery;  Laterality: N/A;   ANTERIOR LAT LUMBAR FUSION N/A 05/30/2020   Procedure: Anterior Lateral Lumbar Fusion Lumbar One-Two with Lateral Plate Removal of pedicle screws Lumbar One and Posterior Lateral Fusion Lumbar One-Lumbar Three;  Surgeon: Joshua Alm RAMAN, MD;  Location: [facility] OR;  Service: Neurosurgery;  Laterality: N/A;   BACK SURGERY     CARDIAC CATHETERIZATION     no PCI   CARPAL TUNNEL RELEASE Left 03/04/2018   Procedure: Carpal Tunnel Release - left;  Surgeon: Joshua Alm RAMAN, MD;  Location: [facility] OR;  Service: Neurosurgery;  Laterality: Left;   CHOLECYSTECTOMY     COLONOSCOPY W/ POLYPECTOMY  08/30/2012   Colonic polyp status post polypectomy.  Pancolonic diverticulosis predominantly in the sigmoid colon. Small internal hemorrhoids. Moderate internal hemorhroids.    ESOPHAGOGASTRODUODENOSCOPY  08/04/2016   Schatzkis ring status post esophageal dilatation. Small hiatal hernia. Mild gastritis.    EYE SURGERY     cataract removal bilateral eyes    FOOT SURGERY     left heel   HARDWARE REMOVAL N/A 05/30/2020   Procedure: HARDWARE REMOVAL;  Surgeon: Joshua Alm RAMAN, MD;  Location: [facility] OR;  Service: Neurosurgery;  Laterality: N/A;   HERNIA REPAIR     LAMINECTOMY WITH POSTERIOR LATERAL ARTHRODESIS LEVEL 2 N/A 12/09/2019   Procedure: Laminectomy and Foraminotomy - Lumbar one-two, Lumbar two-three, posterolateral instrumented fusion Lumbar one-three, removal of instrumentation Lumbar three-five;  Surgeon: Joshua Alm RAMAN, MD;  Location: [facility] OR;  Service: Neurosurgery;  Laterality: N/A;   LEFT HEART CATH AND CORONARY ANGIOGRAPHY N/A 08/03/2019   Procedure: LEFT HEART CATH AND CORONARY ANGIOGRAPHY;  Surgeon: Dann Candyce RAMAN, MD;  Location: [facility] INVASIVE CV LAB;  Service: Cardiovascular;  Laterality: N/A;   SHOULDER SURGERY     bilateral   WISDOM TOOTH EXTRACTION         Home Medications    Prior to Admission medications   Medication Sig Start Date End Date Taking? Authorizing Provider  aspirin  EC 81 MG tablet Take 81 mg by mouth daily. 07/02/18  Yes [provider]  ACCU-CHEK GUIDE TEST test strip MAY SUBSTITUTE TO  ANY MANUFACTURER COVERED BY PATIENT'S INSURANCE. CHECK SUGAR FASTING. 07/29/23   Cox, Abigail, MD  Accu-Chek Softclix Lancets lancets 1 EACH BY DOES NOT APPLY ROUTE IN THE MORNING, AT NOON, AND AT BEDTIME. MAY SUBSTITUTE TO ANY MANUFACTURER COVERED BY PATIENT'S INSURANCE. 08/30/23   Cox, Kirsten, MD  atorvastatin  (LIPITOR) 40 MG tablet TAKE 1 TABLET BY MOUTH EVERY DAY 10/25/23   Cox, Abigail, MD  Blood Glucose Monitoring Suppl DEVI 1 each by Does not apply route in the morning, at noon, and at bedtime. May substitute to any manufacturer covered by patient's insurance. 07/29/23   Cox, Abigail, MD  carboxymethylcellulose (REFRESH PLUS) 0.5 % SOLN Place 1 drop into both eyes 3 (three) times daily as needed for dry eyes (dry/irritated eyes).    [provider]  cholecalciferol  (VITAMIN D ) 1000 UNITS tablet Take 1,000  Units by mouth in the morning.    [provider]  COSOPT 2-0.5 % ophthalmic solution 1 drop 2 (two) times daily. 01/28/23   [provider]  ELIQUIS 5 MG TABS tablet Take 5 mg by mouth 2 (two) times daily. 07/30/23   [provider]  Fluticasone-Umeclidin-Vilant (TRELEGY ELLIPTA ) 100-62.5-25 MCG/ACT AEPB Inhale 1 puff into the lungs daily. 08/07/23   CoxAbigail, MD  icosapent  Ethyl (VASCEPA ) 1 g capsule Take 2 capsules (2 g total) by mouth 2 (two) times daily. 02/26/23   Cox, Abigail, MD  latanoprost (XALATAN) 0.005 % ophthalmic solution 1 drop at bedtime. 12/03/22   [provider]  lisinopril  (ZESTRIL ) 20 MG tablet TAKE 1 TABLET BY MOUTH EVERY DAY 10/01/23   Cox, Kirsten, MD  metFORMIN  (GLUCOPHAGE ) 500 MG tablet TAKE 1 TABLET (500 MG TOTAL) BY MOUTH DAILY. 08/25/23   Cox, Abigail, MD  nitroGLYCERIN  (NITROSTAT ) 0.4 MG SL tablet Place 1 tablet (0.4 mg total) under the tongue every 5 (five) minutes x 3 doses as needed for chest pain. 06/28/19   Revankar, Jennifer SAUNDERS, MD  omeprazole  (PRILOSEC) 40 MG capsule TAKE 1 CAPSULE (40 MG TOTAL) BY MOUTH DAILY. 01/01/24   CoxAbigail, MD  tamsulosin  (FLOMAX ) 0.4 MG CAPS capsule Take 1 capsule (0.4 mg total) by mouth daily after supper. 04/24/22   CoxAbigail, MD  triamcinolone  cream (KENALOG ) 0.1 % Apply 1 Application topically 2 (two) times daily. 11/06/23 05/04/24  [provider]    Family History Family History  Problem Relation Age of Onset   Colon cancer Neg Hx    Esophageal cancer Neg Hx    Rectal cancer Neg Hx    Stomach cancer Neg Hx    Colon polyps Neg Hx     Social History Social History   Tobacco Use   Smoking status: Former    Current packs/day: 0.00    Average packs/day: 0.5 packs/day for 35.0 years (17.5 ttl pk-yrs)    Types: Cigarettes    Start date: 02/10/1958    Quit date: 02/10/1993    Years since quitting: 30.9   Smokeless tobacco: Former    Types: Chew    Quit date: 02/10/2006  Vaping Use    Vaping status: Never Used  Substance Use Topics   Alcohol  use: Not Currently    Alcohol /week: 1.0 standard drink of alcohol     Types: 1 Cans of beer per week    Comment: occ   Drug use: No     Allergies   Oxycodone    Review of Systems Review of Systems See HPI  Physical Exam Triage Vital Signs ED Triage Vitals  Encounter Vitals  Group     BP 01/01/24 1025 (!) 169/94     Girls Systolic BP Percentile --      Girls Diastolic BP Percentile --      Boys Systolic BP Percentile --      Boys Diastolic BP Percentile --      Pulse Rate 01/01/24 1025 73     Resp 01/01/24 1025 20     Temp 01/01/24 1025 97.8 F (36.6 C)     Temp Source 01/01/24 1025 Oral     SpO2 01/01/24 1025 98 %     Weight --      Height --      Head Circumference --      Peak Flow --      Pain Score 01/01/24 1026 4     Pain Loc --      Pain Education --      Exclude from Growth Chart --    No data found.  Updated Vital Signs BP (!) 169/94 (BP Location: Left Arm)   Pulse 73   Temp 97.8 F (36.6 C) (Oral)   Resp 20   SpO2 98%   Visual Acuity Right Eye Distance:   Left Eye Distance:   Bilateral Distance:    Right Eye Near:   Left Eye Near:    Bilateral Near:     Physical Exam Constitutional:      Appearance: Normal appearance.  Pulmonary:     Effort: Pulmonary effort is normal.  Musculoskeletal:        General: Swelling present. Normal range of motion.     Comments: Right elbow bursitis.  Tender to touch.  No erythema.  Neurological:     Mental Status: He is alert.  Psychiatric:        Mood and Affect: Mood normal.      UC Treatments / Results  Labs (all labs ordered are listed, but only abnormal results are displayed) Labs Reviewed - No data to display  EKG   Radiology DG Elbow Complete Right Result Date: 01/01/2024 EXAM: 3 VIEW(S) XRAY OF THE ELBOW COMPARISON: None available. CLINICAL HISTORY: elbow swelling FINDINGS: BONES AND JOINTS: Olecranon spurring. Chronic  calcifications adjacent to the lateral epicondyle. Small joint effusion. No acute fracture. No focal osseous lesion. No joint dislocation. SOFT TISSUES: Soft tissue swelling overlying the olecranon process. IMPRESSION: 1. Small joint effusion and soft tissue swelling overlying the olecranon process. The imaging findings are concerning for underlying olecranon bursitis. In the absence of any recent trauma, occult radial head fracture (often associated with joint effusion in the adult population) is considered less likely. 2. Olecranon spurring and chronic calcifications adjacent to the lateral epicondyle. Electronically signed by: Waddell Calk MD 01/01/2024 11:55 AM EST RP Workstation: [id number]    Procedures Procedures (including critical care time)  Medications Ordered in UC Medications - No data to display  Initial Impression / Assessment and Plan / UC Course  I have reviewed the triage vital signs and the nursing notes.  Pertinent labs & imaging results that were available during my care of the patient were reviewed by me and considered in my medical decision making (see chart for details).     Olecranon bursitis of the right elbow.  Recommend to wear a compression to the area to help with the swelling.  He can rub Voltaren gel on the area.  Recommend rest, ice and elevate is much as possible.  He may need follow-up with orthopedics for injection of the elbow with  corticosteroid.  Follow-up with Ortho for any continued issues Final Clinical Impressions(s) / UC Diagnoses   Final diagnoses:  Elbow swelling, right  Bursitis of right elbow, unspecified bursa     Discharge Instructions      I believe this is bursitis.  Recommendations are to wear compression to the area to help with the swelling.  You can rub some Voltaren gel on the area. Try to rest the joint is much as possible.  You can ice the area 3-4 times a day.  Elevate the arm. You may want to follow-up with orthopedic.   Sometimes they can aspirate the fluid in the elbow and put a steroid injection and also.    ED Prescriptions   None    PDMP not reviewed this encounter.   Adah Wilbert LABOR, FNP 01/01/24 [phone]

## 2024-01-01 NOTE — ED Triage Notes (Signed)
 Right elbow pain x approx 1 month. Elbow has a soft raised area. No redness. Patient states noticed one day when he attempted to rest elbow on arm of chair and felt pain.

## 2024-01-04 DIAGNOSIS — Z905 Acquired absence of kidney: Secondary | ICD-10-CM | POA: Diagnosis not present

## 2024-01-04 DIAGNOSIS — Z85528 Personal history of other malignant neoplasm of kidney: Secondary | ICD-10-CM | POA: Diagnosis not present

## 2024-01-04 DIAGNOSIS — Z08 Encounter for follow-up examination after completed treatment for malignant neoplasm: Secondary | ICD-10-CM | POA: Diagnosis not present

## 2024-01-04 DIAGNOSIS — C642 Malignant neoplasm of left kidney, except renal pelvis: Secondary | ICD-10-CM | POA: Diagnosis not present

## 2024-01-11 DIAGNOSIS — C642 Malignant neoplasm of left kidney, except renal pelvis: Secondary | ICD-10-CM | POA: Diagnosis not present

## 2024-01-21 ENCOUNTER — Ambulatory Visit

## 2024-02-03 ENCOUNTER — Other Ambulatory Visit: Payer: Self-pay | Admitting: Family Medicine

## 2024-02-19 ENCOUNTER — Ambulatory Visit: Attending: Cardiology | Admitting: Cardiology

## 2024-02-19 ENCOUNTER — Encounter: Payer: Self-pay | Admitting: Cardiology

## 2024-02-19 VITALS — BP 138/84 | HR 68 | Ht 71.0 in | Wt 188.2 lb

## 2024-02-19 DIAGNOSIS — I110 Hypertensive heart disease with heart failure: Secondary | ICD-10-CM

## 2024-02-19 DIAGNOSIS — C61 Malignant neoplasm of prostate: Secondary | ICD-10-CM

## 2024-02-19 DIAGNOSIS — E1169 Type 2 diabetes mellitus with other specified complication: Secondary | ICD-10-CM

## 2024-02-19 DIAGNOSIS — I2510 Atherosclerotic heart disease of native coronary artery without angina pectoris: Secondary | ICD-10-CM | POA: Diagnosis not present

## 2024-02-19 DIAGNOSIS — I5032 Chronic diastolic (congestive) heart failure: Secondary | ICD-10-CM

## 2024-02-19 DIAGNOSIS — E782 Mixed hyperlipidemia: Secondary | ICD-10-CM | POA: Diagnosis not present

## 2024-02-19 NOTE — Progress Notes (Unsigned)
 " Cardiology Office Note:    Date:  02/19/2024   ID:  Oscar, Torres 03/14/1940, MRN [id number]  PCP:  Sherre Clapper, MD  Cardiologist:  Lamar Fitch, MD    Referring MD: Sherre Clapper, MD   No chief complaint on file. Doing fine  History of Present Illness:    [name] is a 84 y.o. male past medical history significant for coronary disease, in June 2021 cardiac catheterization showed 30% stenosis circumflex, 40% stenosis of the proximal to mid LAD, ejection fraction was normal at that time he was found to have aorta measuring 40 mm.  Comes today to my office for follow-up.  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  He does have frequent follow-up with urologist so far things are looking good PET scan is negative.  Denies having any cardiac complaints  Past Medical History:  Diagnosis Date   Angina pectoris 08/02/2019   Arthritis    on meds   Atrial fibrillation (HCC)    Backache 08/17/2012   Body mass index (BMI) 26.0-26.9, adult 05/08/2020   Cancer (HCC) 10/2015   prostate cancer treated with radiation   Carpal tunnel syndrome 01/18/2018   Cataract    sx-bilaterally   Cervical spondylosis without myelopathy 09/24/2015   Chest tightness 06/28/2019   Chronic back pain 05/30/2013   Chronic low back pain 02/20/2014   Colitis 11/22/2018   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 06/17/2016   Deficiency of other specified B group vitamins 05/20/2019   Depression 04/04/2019   Diabetes mellitus due to underlying condition with unspecified complications (HCC) 06/28/2019   Diabetes mellitus without complication (HCC)    Type II   Diarrhea 11/21/2018   Dyslipidemia 06/17/2016   Dyspnea on exertion 06/28/2019   Dysrhythmia    Essential (primary) hypertension 06/17/2016   Essential hypertension 06/17/2016   GERD (gastroesophageal reflux disease)     on meds   Greater trochanteric pain syndrome 07/03/2020   Headache    History of hiatal hernia    History of kidney stones    History of prostate cancer 05/20/2019   Hypercholesteremia 04/04/2019   Hyperlipemia    Hypertension    Impaired fasting blood sugar 05/20/2019   Impingement syndrome of shoulder region 01/12/2018   Impingement syndrome of shoulder region 01/12/2018   Low back pain 02/20/2014   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   Lumbar radiculopathy 05/24/2019   Malignant neoplasm prostate (HCC) 05/20/2019   Medicare annual wellness visit, subsequent 01/16/2020   Mixed hyperlipidemia 08/31/2019   Neck pain 07/03/2015   Paresthesia of hand 01/12/2018   Paresthesia of upper limb 01/12/2018   Paroxysmal atrial fibrillation (HCC) 04/04/2019   PONV (postoperative nausea and vomiting)    Pyelonephritis 06/08/2021   S/P cervical spinal fusion 03/04/2018   S/P lumbar fusion 12/09/2019   S/P lumbar spinal fusion 09/29/2013   S/P lumbar spinal fusion 09/29/2013   Sepsis (HCC) 11/22/2018   Shoulder pain 01/12/2018   Spinal 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 01/26/2018   Stenosis of intervertebral foramina 01/12/2018   Trochanteric bursitis of right hip 04/19/2020    Past Surgical History:  Procedure Laterality Date   anal fissures     ANTERIOR CERVICAL DECOMP/DISCECTOMY FUSION N/A 03/04/2018   Procedure: Anterior Cervical Decompression Fusion - Cervical three-Cervical four - Cervical four-Cervical five;  Surgeon: Joshua Alm RAMAN, MD;  Location: [facility] OR;  Service: Neurosurgery;  Laterality: N/A;   ANTERIOR LAT LUMBAR FUSION N/A 05/30/2020   Procedure: Anterior Lateral Lumbar Fusion Lumbar One-Two with Lateral Plate Removal of pedicle screws Lumbar One and Posterior Lateral Fusion Lumbar One-Lumbar Three;  Surgeon: Joshua Alm RAMAN, MD;  Location: [facility] OR;  Service: Neurosurgery;  Laterality: N/A;   BACK SURGERY     CARDIAC CATHETERIZATION     no PCI   CARPAL TUNNEL RELEASE Left 03/04/2018   Procedure: Carpal  Tunnel Release - left;  Surgeon: Joshua Alm RAMAN, MD;  Location: [facility] OR;  Service: Neurosurgery;  Laterality: Left;   CHOLECYSTECTOMY     COLONOSCOPY W/ POLYPECTOMY  08/30/2012   Colonic polyp status post polypectomy.  Pancolonic diverticulosis predominantly in the sigmoid colon. Small internal hemorrhoids. Moderate internal hemorhroids.    ESOPHAGOGASTRODUODENOSCOPY  08/04/2016   Schatzkis ring status post esophageal dilatation. Small hiatal hernia. Mild gastritis.    EYE SURGERY     cataract removal bilateral eyes   FOOT SURGERY     left heel   HARDWARE REMOVAL N/A 05/30/2020   Procedure: HARDWARE REMOVAL;  Surgeon: Joshua Alm RAMAN, MD;  Location: [facility] OR;  Service: Neurosurgery;  Laterality: N/A;   HERNIA REPAIR     LAMINECTOMY WITH POSTERIOR LATERAL ARTHRODESIS LEVEL 2 N/A 12/09/2019   Procedure: Laminectomy and Foraminotomy - Lumbar one-two, Lumbar two-three, posterolateral instrumented fusion Lumbar one-three, removal of instrumentation Lumbar three-five;  Surgeon: Joshua Alm RAMAN, MD;  Location: [facility] OR;  Service: Neurosurgery;  Laterality: N/A;   LEFT HEART CATH AND CORONARY ANGIOGRAPHY N/A 08/03/2019   Procedure: LEFT HEART CATH AND CORONARY ANGIOGRAPHY;  Surgeon: Dann Candyce RAMAN, MD;  Location: [facility] INVASIVE CV LAB;  Service: Cardiovascular;  Laterality: N/A;   SHOULDER SURGERY     bilateral   WISDOM TOOTH EXTRACTION      Current Medications: Active Medications[1]   Allergies:   Oxycodone    Social History   Socioeconomic History   Marital status: Widowed    Spouse name: Not on file   Number of children: 2   Years of education: Not on file   Highest education level: GED or equivalent  Occupational History   Not on file  Tobacco Use   Smoking status: Former    Current packs/day: 0.00    Average packs/day: 0.5 packs/day for 35.0 years (17.5 ttl pk-yrs)    Types: Cigarettes    Start date: 02/10/1958    Quit date: 02/10/1993    Years since quitting: 31.0   Smokeless tobacco:  Former    Types: Chew    Quit date: 02/10/2006  Vaping Use   Vaping status: Never Used  Substance and Sexual Activity   Alcohol  use: Not Currently    Alcohol /week: 1.0 standard drink of alcohol     Types: 1 Cans of beer per week    Comment: occ   Drug use: No   Sexual activity: Yes    Partners: Female  Other Topics Concern   Not on file  Social History Narrative   Not on file   Social Drivers of Health   Tobacco Use: Medium Risk (02/19/2024)   Patient History    Smoking Tobacco Use: Former    Smokeless Tobacco Use: Former    Passive Exposure: Not on Actuary Strain: Low Risk (03/07/2023)   Overall Financial Resource Strain (CARDIA)    Difficulty of Paying Living Expenses: Not hard at all  Food Insecurity: Low Risk (04/15/2023)   Received from [facility]  Within the past 12 months, you worried that your food would run out before you got money to buy more: Never true    Within the past 12 months, the food you bought just didn't last and you didn't have money to get more. : Never true  Transportation Needs: Unmet Transportation Needs (04/15/2023)   Received from [employer]    In the past 12 months, has lack of reliable transportation kept you from medical appointments, meetings, work or from getting things needed for daily living? : Yes  Physical Activity: Inactive (03/07/2023)   Exercise Vital Sign    Days of Exercise per Week: 0 days    Minutes of Exercise per Session: 0 min  Stress: Stress Concern Present (03/07/2023)   [employer] of Occupational Health - Occupational Stress Questionnaire    Feeling of Stress : To some extent  Social Connections: Moderately Integrated (03/07/2023)   Social Connection and Isolation Panel    Frequency of Communication with Friends and Family: More than three times a week    Frequency of Social Gatherings with Friends and Family: More than three times a week    Attends Religious Services: More  than 4 times per year    Active Member of [employer] or Organizations: Yes    Attends Banker Meetings: Patient declined    Marital Status: Widowed  Recent Concern: Social Connections - Moderately Isolated (01/15/2023)   Social Connection and Isolation Panel    Frequency of Communication with Friends and Family: More than three times a week    Frequency of Social Gatherings with Friends and Family: More than three times a week    Attends Religious Services: More than 4 times per year    Active Member of [employer] or Organizations: No    Attends Banker Meetings: Patient declined    Marital Status: Widowed  Depression (PHQ2-9): Low Risk (07/29/2023)   Depression (PHQ2-9)    PHQ-2 Score: 0  Alcohol  Screen: Low Risk (01/15/2023)   Alcohol  Screen    Last Alcohol  Screening Score (AUDIT): 1  Housing: Low Risk (04/15/2023)   Received from [facility]   Epic    What is your living situation today?: I have a steady place to live    Think about the place you live. Do you have problems with any of the following? Choose all that apply:: None/None on this list  Utilities: Low Risk (04/15/2023)   Received from [facility]   Utilities    In the past 12 months has the electric, gas, oil, or water company threatened to shut off services in your home? : No  Health Literacy: Adequate Health Literacy (01/15/2023)   B1300 Health Literacy    Frequency of need for help with medical instructions: Never     Family History: The patient's family history is negative for Colon cancer, Esophageal cancer, Rectal cancer, Stomach cancer, and Colon polyps. ROS:   Please see the history of present illness.    All 14 point review of systems negative except as described per history of present illness  EKGs/Labs/Other Studies Reviewed:         Recent Labs: 03/30/2023: ALT 13; Hemoglobin 14.8; Platelets 193 08/10/2023: BUN 22; Creatinine, Ser 1.27; NT-Pro BNP 234; Potassium 4.5; Sodium 141  Recent  Lipid Panel    Component Value Date/Time   CHOL 103 03/30/2023 0811   TRIG 76 03/30/2023 0811   HDL 40 03/30/2023 0811   CHOLHDL 2.6 03/30/2023 9188  CHOLHDL 2.6 01/24/2017 0521   VLDL 15 01/24/2017 0521   LDLCALC 47 03/30/2023 0811    Physical Exam:    VS:  BP 138/84   Pulse 68   Ht 5' 11 (1.803 m)   Wt 188 lb 4 oz (85.4 kg)   SpO2 97%   BMI 26.26 kg/m     Wt Readings from Last 3 Encounters:  02/19/24 188 lb 4 oz (85.4 kg)  11/10/23 182 lb (82.6 kg)  09/22/23 180 lb 12.8 oz (82 kg)     GEN:  Well nourished, well developed in no acute distress HEENT: Normal NECK: No JVD; No carotid bruits LYMPHATICS: No lymphadenopathy CARDIAC: RRR, no murmurs, no rubs, no gallops RESPIRATORY:  Clear to auscultation without rales, wheezing or rhonchi  ABDOMEN: Soft, non-tender, non-distended MUSCULOSKELETAL:  No edema; No deformity  SKIN: Warm and dry LOWER EXTREMITIES: no swelling NEUROLOGIC:  Alert and oriented x 3 PSYCHIATRIC:  Normal affect   ASSESSMENT:    1. Coronary artery disease involving native coronary artery of native heart without angina pectoris   2. Hypertensive heart disease with chronic diastolic congestive heart failure (HCC)   3. DM type 2 with diabetic mixed hyperlipidemia (HCC)   4. Malignant neoplasm prostate (HCC)    PLAN:    In order of problems listed above:  Coronary disease stable from that point review will continue present management. Hypertension blood pressure well-controlled continue present management. Dyslipidemia I did review [facility] data from February 2025 LDL 47 HDL 40 will repeat fasting lipid profile. Malignant prostate cancer follow-up excellently by urology team   Medication Adjustments/Labs and Tests Ordered: Current medicines are reviewed at length with the patient today.  Concerns regarding medicines are outlined above.  No orders of the defined types were placed in this encounter.  Medication changes: No orders of the defined  types were placed in this encounter.   Signed, Lamar DOROTHA Fitch, MD, [facility] 02/19/2024 9:20 AM     [facility]    [1]  Current Meds  Medication Sig   ACCU-CHEK GUIDE TEST test strip MAY SUBSTITUTE TO ANY MANUFACTURER COVERED BY PATIENT'S INSURANCE. CHECK SUGAR FASTING.   Accu-Chek Softclix Lancets lancets 1 EACH BY DOES NOT APPLY ROUTE IN THE MORNING, AT NOON, AND AT BEDTIME. MAY SUBSTITUTE TO ANY MANUFACTURER COVERED BY PATIENT'S INSURANCE.   aspirin  EC 81 MG tablet Take 81 mg by mouth daily.   atorvastatin  (LIPITOR) 40 MG tablet TAKE 1 TABLET BY MOUTH EVERY DAY   Blood Glucose Monitoring Suppl DEVI 1 each by Does not apply route in the morning, at noon, and at bedtime. May substitute to any manufacturer covered by patient's insurance.   carboxymethylcellulose (REFRESH PLUS) 0.5 % SOLN Place 1 drop into both eyes 3 (three) times daily as needed for dry eyes (dry/irritated eyes).   cholecalciferol  (VITAMIN D ) 1000 UNITS tablet Take 1,000 Units by mouth in the morning.   COSOPT 2-0.5 % ophthalmic solution 1 drop 2 (two) times daily.   ELIQUIS 5 MG TABS tablet Take 5 mg by mouth 2 (two) times daily.   Fluticasone-Umeclidin-Vilant (TRELEGY ELLIPTA ) 100-62.5-25 MCG/ACT AEPB Inhale 1 puff into the lungs daily.   icosapent  Ethyl (VASCEPA ) 1 g capsule Take 2 capsules (2 g total) by mouth 2 (two) times daily.   latanoprost (XALATAN) 0.005 % ophthalmic solution 1 drop at bedtime.   lisinopril  (ZESTRIL ) 20 MG tablet TAKE 1 TABLET BY MOUTH EVERY DAY   metFORMIN  (GLUCOPHAGE ) 500 MG tablet TAKE 1 TABLET (  500 MG TOTAL) BY MOUTH DAILY.   nitroGLYCERIN  (NITROSTAT ) 0.4 MG SL tablet Place 1 tablet (0.4 mg total) under the tongue every 5 (five) minutes x 3 doses as needed for chest pain.   omeprazole  (PRILOSEC) 40 MG capsule TAKE 1 CAPSULE (40 MG TOTAL) BY MOUTH DAILY.   predniSONE  (DELTASONE ) 20 MG tablet Take 20 mg by mouth daily at 6 (six) AM.   tamsulosin  (FLOMAX ) 0.4 MG CAPS  capsule Take 1 capsule (0.4 mg total) by mouth daily after supper.   triamcinolone  cream (KENALOG ) 0.1 % Apply 1 Application topically 2 (two) times daily.   "

## 2024-02-19 NOTE — Patient Instructions (Signed)
 Medication Instructions:  Your physician recommends that you continue on your current medications as directed. Please refer to the Current Medication list given to you today.  *If you need a refill on your cardiac medications before your next appointment, please call your pharmacy*   Lab Work: Your physician recommends that you have a AST, ALT and lipids today in the office.  If you have labs (blood work) drawn today and your tests are completely normal, you will receive your results only by: MyChart Message (if you have MyChart) OR A paper copy in the mail If you have any lab test that is abnormal or we need to change your treatment, we will call you to review the results.   Testing/Procedures: None ordered   Follow-Up: At [facility], you and your health needs are our priority.  As part of our continuing mission to provide you with exceptional heart care, we have created designated Provider Care Teams.  These Care Teams include your primary Cardiologist (physician) and Advanced Practice Providers (APPs -  Physician Assistants and Nurse Practitioners) who all work together to provide you with the care you need, when you need it.  We recommend signing up for the patient portal called MyChart.  Sign up information is provided on this After Visit Summary.  MyChart is used to connect with patients for Virtual Visits (Telemedicine).  Patients are able to view lab/test results, encounter notes, upcoming appointments, etc.  Non-urgent messages can be sent to your provider as well.   To learn more about what you can do with MyChart, go to [email or web address].    Your next appointment:   6 month(s)  The format for your next appointment:   In Person  Provider:   Lamar Fitch, MD    Other Instructions none  Important Information About Sugar

## 2024-02-20 LAB — LIPID PANEL
Chol/HDL Ratio: 2.3 ratio (ref 0.0–5.0)
Cholesterol, Total: 112 mg/dL (ref 100–199)
HDL: 49 mg/dL
LDL Chol Calc (NIH): 47 mg/dL (ref 0–99)
Triglycerides: 77 mg/dL (ref 0–149)
VLDL Cholesterol Cal: 16 mg/dL (ref 5–40)

## 2024-02-20 LAB — ALT: ALT: 19 IU/L (ref 0–44)

## 2024-02-20 LAB — AST: AST: 20 IU/L (ref 0–40)

## 2024-02-22 ENCOUNTER — Ambulatory Visit: Payer: Self-pay | Admitting: Cardiology

## 2024-02-24 ENCOUNTER — Encounter (HOSPITAL_BASED_OUTPATIENT_CLINIC_OR_DEPARTMENT_OTHER): Payer: Self-pay

## 2024-02-24 ENCOUNTER — Ambulatory Visit (HOSPITAL_BASED_OUTPATIENT_CLINIC_OR_DEPARTMENT_OTHER): Admit: 2024-02-24 | Discharge: 2024-02-24 | Disposition: A | Discharge status: Home / Self Care | Admitting: Radiology

## 2024-02-24 ENCOUNTER — Telehealth: Payer: Self-pay

## 2024-02-24 ENCOUNTER — Ambulatory Visit (HOSPITAL_BASED_OUTPATIENT_CLINIC_OR_DEPARTMENT_OTHER)
Admission: EM | Admit: 2024-02-24 | Discharge: 2024-02-24 | Disposition: A | Discharge status: Home / Self Care | Attending: Family Medicine | Admitting: Family Medicine

## 2024-02-24 ENCOUNTER — Other Ambulatory Visit (HOSPITAL_BASED_OUTPATIENT_CLINIC_OR_DEPARTMENT_OTHER): Payer: Self-pay

## 2024-02-24 ENCOUNTER — Ambulatory Visit (HOSPITAL_BASED_OUTPATIENT_CLINIC_OR_DEPARTMENT_OTHER): Payer: Self-pay | Admitting: Family Medicine

## 2024-02-24 DIAGNOSIS — R0789 Other chest pain: Secondary | ICD-10-CM | POA: Diagnosis not present

## 2024-02-24 DIAGNOSIS — S0081XA Abrasion of other part of head, initial encounter: Secondary | ICD-10-CM | POA: Diagnosis not present

## 2024-02-24 DIAGNOSIS — W010XXA Fall on same level from slipping, tripping and stumbling without subsequent striking against object, initial encounter: Secondary | ICD-10-CM

## 2024-02-24 DIAGNOSIS — R066 Hiccough: Secondary | ICD-10-CM | POA: Diagnosis not present

## 2024-02-24 MED ORDER — MUPIROCIN 2 % EX OINT
1.0000 | TOPICAL_OINTMENT | Freq: Two times a day (BID) | CUTANEOUS | 0 refills | Status: AC
  Filled 2024-02-24: qty 22, 10d supply, fill #0

## 2024-02-24 NOTE — Discharge Instructions (Signed)
 Rib pain after fall: Patient is currently on prednisone  20 mg daily until 03/07/2024.  This is a 21-day course of daily prednisone .  Additional NSAIDs not advised (see below).  X-rays appear negative for fractures.  I will contact the patient if the radiology review differs.  Encouraged good deep breaths to prevent pneumonia.  Avoid use of a rib belt to prevent pneumonia.  Do not use Advil, Motrin, Ibuprofen, Aleve, Naproxen Sodium (NSAIDS) for 1 to 2 weeks during/after using oral steroids or after a steroid injection.  May use Tylenol /Acetaminophen , 500 mg,1-1.5 pills, every 6 hours as needed for pain.   Intractable hiccups: Patient has had intermittent intractable hiccups many times in his life.  He has had some GI and neuro workup for this.  Encouraged him to take some laxatives and try to get empty of stool as sometimes constipation can contribute to her worsen hiccups.  Abrasion on face after fall: Clean the area with warm soapy fingers, rinse, pat dry, apply mupirocin  ointment.  If there is any redness, discharge or worsening pain, return here or go to primary care as these could be signs of infection.  Follow-up if symptoms do not improve, worsen or new symptoms occur.

## 2024-02-24 NOTE — ED Triage Notes (Signed)
 Pt c/o left rib/underarm pain since Monday. States he was walking his dog when his dog decided to jump a ditch and ended up dragging him. Since the fall he has increased left rib and underarm pain. He also has lacerations where his glasses hit the bridge of his nose but denies further injuries. Pt has not taken anything for the pain today.

## 2024-02-24 NOTE — ED Provider Notes (Signed)
 " [facility]    CSN: [id number] Arrival date & time: 02/24/24  9076      History   Chief Complaint Chief Complaint  Patient presents with   Rib Injury    HPI KEYGAN Torres is a 84 y.o. male.   84 year old male with left rib and left underarm pain since Monday, 02/22/2024.  While walking his dog on that day, his dog jumped and pulled him forward and he fell and hit his ribs on the left side.  He is having left rib and left underarm pain.  He has a few superficial lacerations on his face where his glasses hit the bridge of his nose.  He denies other injuries.  He is not currently taking anything over-the-counter or prescription for pain.  He is concerned about possible rib fracture.     Past Medical History:  Diagnosis Date   Angina pectoris 08/02/2019   Arthritis    on meds   Atrial fibrillation (HCC)    Backache 08/17/2012   Body mass index (BMI) 26.0-26.9, adult 05/08/2020   Cancer (HCC) 10/2015   prostate cancer treated with radiation   Carpal tunnel syndrome 01/18/2018   Cataract    sx-bilaterally   Cervical spondylosis without myelopathy 09/24/2015   Chest tightness 06/28/2019   Chronic back pain 05/30/2013   Chronic low back pain 02/20/2014   Colitis 11/22/2018   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 06/17/2016   Deficiency of other specified B group vitamins 05/20/2019   Depression 04/04/2019   Diabetes mellitus due to underlying condition with unspecified complications (HCC) 06/28/2019   Diabetes mellitus without complication (HCC)    Type II   Diarrhea 11/21/2018   Dyslipidemia 06/17/2016   Dyspnea on exertion 06/28/2019   Dysrhythmia    Essential (primary) hypertension 06/17/2016   Essential hypertension 06/17/2016   GERD (gastroesophageal reflux disease)    on meds   Greater trochanteric pain syndrome 07/03/2020   Headache    History of hiatal hernia    History of kidney stones    History of prostate cancer 05/20/2019    Hypercholesteremia 04/04/2019   Hyperlipemia    Hypertension    Impaired fasting blood sugar 05/20/2019   Impingement syndrome of shoulder region 01/12/2018   Impingement syndrome of shoulder region 01/12/2018   Low back pain 02/20/2014   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   Lumbar radiculopathy 05/24/2019   Malignant neoplasm prostate (HCC) 05/20/2019   Medicare annual wellness visit, subsequent 01/16/2020   Mixed hyperlipidemia 08/31/2019   Neck pain 07/03/2015   Paresthesia of hand 01/12/2018   Paresthesia of upper limb 01/12/2018   Paroxysmal atrial fibrillation (HCC) 04/04/2019   PONV (postoperative nausea and vomiting)    Pyelonephritis 06/08/2021   S/P cervical spinal fusion 03/04/2018   S/P lumbar fusion 12/09/2019   S/P lumbar spinal fusion 09/29/2013   S/P lumbar spinal fusion 09/29/2013   Sepsis (HCC) 11/22/2018   Shoulder pain 01/12/2018   Spinal 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 01/26/2018   Stenosis of intervertebral foramina 01/12/2018   Trochanteric bursitis of right hip 04/19/2020    Patient Active Problem List   Diagnosis Date Noted   Dehydration 08/06/2023   Centrilobular emphysema (HCC) 08/06/2023   DM type 2 with diabetic mixed hyperlipidemia (HCC) 08/06/2023   Irregular heart rhythm 07/29/2023   Diabetes mellitus without complication (HCC) 07/29/2023   Memory loss 07/29/2023   Bradycardia 07/29/2023   Dizzy 07/29/2023   Plantar fasciitis of right foot 04/29/2023  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/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   Postoperative examination 07/16/2022   Lipoma of neck 07/03/2022   Bladder tumor 02/06/2022   Urge incontinence 09/29/2021   Muscle cramp 09/29/2021   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   Malignant neoplasm of overlapping sites of bladder (HCC) 03/11/2021   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   Deviated nasal septum 12/25/2020    Hypertrophy of inferior nasal turbinate 12/25/2020   Arthritis    Cataract    Dysrhythmia    GERD (gastroesophageal reflux disease)    History of hiatal hernia    History of kidney stones    PONV (postoperative nausea and vomiting)    S/P lumbar fusion 12/09/2019   Mixed hyperlipidemia 08/31/2019   Angina pectoris 08/02/2019   Deficiency of other specified B group vitamins 05/20/2019   Impaired fasting blood sugar 05/20/2019   History of prostate cancer 05/20/2019   Malignant neoplasm prostate (HCC) 05/20/2019   Depression 04/04/2019   Paroxysmal atrial fibrillation (HCC) 04/04/2019   Colitis 11/22/2018   S/P cervical spinal fusion 03/04/2018   Carpal tunnel syndrome 01/18/2018   Acute pain of right shoulder 01/12/2018   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 06/17/2016   Cervical spondylosis without myelopathy 09/24/2015    Past Surgical History:  Procedure Laterality Date   anal fissures     ANTERIOR CERVICAL DECOMP/DISCECTOMY FUSION N/A 03/04/2018   Procedure: Anterior Cervical Decompression Fusion - Cervical three-Cervical four - Cervical four-Cervical five;  Surgeon: Joshua Alm RAMAN, MD;  Location: [facility] OR;  Service: Neurosurgery;  Laterality: N/A;   ANTERIOR LAT LUMBAR FUSION N/A 05/30/2020   Procedure: Anterior Lateral Lumbar Fusion Lumbar One-Two with Lateral Plate Removal of pedicle screws Lumbar One and Posterior Lateral Fusion Lumbar One-Lumbar Three;  Surgeon: Joshua Alm RAMAN, MD;  Location: [facility] OR;  Service: Neurosurgery;  Laterality: N/A;   BACK SURGERY     CARDIAC CATHETERIZATION     no PCI   CARPAL TUNNEL RELEASE Left 03/04/2018   Procedure: Carpal Tunnel Release - left;  Surgeon: Joshua Alm RAMAN, MD;  Location: [facility] OR;  Service: Neurosurgery;  Laterality: Left;   CHOLECYSTECTOMY     COLONOSCOPY W/ POLYPECTOMY  08/30/2012   Colonic polyp status post polypectomy.  Pancolonic diverticulosis predominantly in the sigmoid colon.  Small internal hemorrhoids. Moderate internal hemorhroids.    ESOPHAGOGASTRODUODENOSCOPY  08/04/2016   Schatzkis ring status post esophageal dilatation. Small hiatal hernia. Mild gastritis.    EYE SURGERY     cataract removal bilateral eyes   FOOT SURGERY     left heel   HARDWARE REMOVAL N/A 05/30/2020   Procedure: HARDWARE REMOVAL;  Surgeon: Joshua Alm RAMAN, MD;  Location: [facility] OR;  Service: Neurosurgery;  Laterality: N/A;   HERNIA REPAIR     LAMINECTOMY WITH POSTERIOR LATERAL ARTHRODESIS LEVEL 2 N/A 12/09/2019   Procedure: Laminectomy and Foraminotomy - Lumbar one-two, Lumbar two-three, posterolateral instrumented fusion Lumbar one-three, removal of instrumentation Lumbar three-five;  Surgeon: Joshua Alm RAMAN, MD;  Location: [facility] OR;  Service: Neurosurgery;  Laterality: N/A;   LEFT HEART CATH AND CORONARY ANGIOGRAPHY N/A 08/03/2019   Procedure: LEFT HEART CATH AND CORONARY ANGIOGRAPHY;  Surgeon: Dann Candyce RAMAN, MD;  Location: [facility] INVASIVE CV LAB;  Service: Cardiovascular;  Laterality: N/A;   SHOULDER SURGERY     bilateral   WISDOM TOOTH EXTRACTION         Home  Medications    Prior to Admission medications  Medication Sig Start Date End Date Taking? Authorizing Provider  mupirocin  ointment (BACTROBAN ) 2 % Apply 1 Application topically 2 (two) times daily. 02/24/24  Yes Ival Domino, FNP  ACCU-CHEK GUIDE TEST test strip MAY SUBSTITUTE TO ANY MANUFACTURER COVERED BY PATIENT'S INSURANCE. CHECK SUGAR FASTING. 07/29/23   Cox, Abigail, MD  Accu-Chek Softclix Lancets lancets 1 EACH BY DOES NOT APPLY ROUTE IN THE MORNING, AT NOON, AND AT BEDTIME. MAY SUBSTITUTE TO ANY MANUFACTURER COVERED BY PATIENT'S INSURANCE. 08/30/23   Sherre Abigail, MD  aspirin  EC 81 MG tablet Take 81 mg by mouth daily. 07/02/18   [provider]  atorvastatin  (LIPITOR) 40 MG tablet TAKE 1 TABLET BY MOUTH EVERY DAY 10/25/23   Cox, Abigail, MD  Blood Glucose Monitoring Suppl DEVI 1 each by Does not apply route in the  morning, at noon, and at bedtime. May substitute to any manufacturer covered by patient's insurance. 07/29/23   Cox, Abigail, MD  carboxymethylcellulose (REFRESH PLUS) 0.5 % SOLN Place 1 drop into both eyes 3 (three) times daily as needed for dry eyes (dry/irritated eyes).    [provider]  cholecalciferol  (VITAMIN D ) 1000 UNITS tablet Take 1,000 Units by mouth in the morning.    [provider]  COSOPT 2-0.5 % ophthalmic solution 1 drop 2 (two) times daily. 01/28/23   [provider]  ELIQUIS 5 MG TABS tablet Take 5 mg by mouth 2 (two) times daily. 07/30/23   [provider]  Fluticasone-Umeclidin-Vilant (TRELEGY ELLIPTA ) 100-62.5-25 MCG/ACT AEPB Inhale 1 puff into the lungs daily. 08/07/23   CoxAbigail, MD  icosapent  Ethyl (VASCEPA ) 1 g capsule Take 2 capsules (2 g total) by mouth 2 (two) times daily. 02/26/23   Cox, Abigail, MD  latanoprost (XALATAN) 0.005 % ophthalmic solution 1 drop at bedtime. 12/03/22   [provider]  lisinopril  (ZESTRIL ) 20 MG tablet TAKE 1 TABLET BY MOUTH EVERY DAY 10/01/23   Cox, Kirsten, MD  metFORMIN  (GLUCOPHAGE ) 500 MG tablet TAKE 1 TABLET (500 MG TOTAL) BY MOUTH DAILY. 02/03/24   CoxAbigail, MD  nitroGLYCERIN  (NITROSTAT ) 0.4 MG SL tablet Place 1 tablet (0.4 mg total) under the tongue every 5 (five) minutes x 3 doses as needed for chest pain. 06/28/19   Revankar, Jennifer SAUNDERS, MD  omeprazole  (PRILOSEC) 40 MG capsule TAKE 1 CAPSULE (40 MG TOTAL) BY MOUTH DAILY. 01/01/24   Sherre Abigail, MD  predniSONE  (DELTASONE ) 20 MG tablet Take 20 mg by mouth daily at 6 (six) AM. 02/15/24 02/29/24  [provider]  tamsulosin  (FLOMAX ) 0.4 MG CAPS capsule Take 1 capsule (0.4 mg total) by mouth daily after supper. 04/24/22   CoxAbigail, MD  triamcinolone  cream (KENALOG ) 0.1 % Apply 1 Application topically 2 (two) times daily. 11/06/23 05/04/24  [provider]    Family History Family History  Problem Relation Age of Onset    Colon cancer Neg Hx    Esophageal cancer Neg Hx    Rectal cancer Neg Hx    Stomach cancer Neg Hx    Colon polyps Neg Hx     Social History Social History[1]   Allergies   Oxycodone    Review of Systems Review of Systems  Constitutional:  Negative for chills and fever.  HENT:  Negative for ear pain and sore throat.   Eyes:  Negative for pain and visual disturbance.  Respiratory:  Negative for cough.   Cardiovascular:  Positive for chest pain (Left rib and  left axillary pain after a fall). Negative for palpitations.  Gastrointestinal:  Negative for abdominal pain, constipation, diarrhea, nausea and vomiting.  Genitourinary:  Negative for dysuria and hematuria.  Musculoskeletal:  Negative for arthralgias and back pain.  Skin:  Positive for wound (Superficial abrasions or lacerations on face near bridge of nose.). Negative for color change and rash.  Neurological:  Negative for seizures and syncope.  All other systems reviewed and are negative.    Physical Exam Triage Vital Signs ED Triage Vitals  Encounter Vitals Group     BP 02/24/24 1124 (!) 163/96     Girls Systolic BP Percentile --      Girls Diastolic BP Percentile --      Boys Systolic BP Percentile --      Boys Diastolic BP Percentile --      Pulse Rate 02/24/24 1124 64     Resp 02/24/24 1124 20     Temp 02/24/24 1124 97.7 F (36.5 C)     Temp Source 02/24/24 1124 Oral     SpO2 02/24/24 1124 96 %     Weight --      Height --      Head Circumference --      Peak Flow --      Pain Score 02/24/24 1120 5     Pain Loc --      Pain Education --      Exclude from Growth Chart --    No data found.  Updated Vital Signs BP (!) 163/96 (BP Location: Right Arm)   Pulse 64   Temp 97.7 F (36.5 C) (Oral)   Resp 20   SpO2 96%   Visual Acuity Right Eye Distance:   Left Eye Distance:   Bilateral Distance:    Right Eye Near:   Left Eye Near:    Bilateral Near:     Physical Exam Vitals and nursing note  reviewed.  Constitutional:      General: He is not in acute distress.    Appearance: He is well-developed. He is not ill-appearing or toxic-appearing.  HENT:     Head: Normocephalic and atraumatic.      Right Ear: Hearing, tympanic membrane, ear canal and external ear normal.     Left Ear: Hearing, tympanic membrane, ear canal and external ear normal.     Nose: No congestion or rhinorrhea.     Right Sinus: No maxillary sinus tenderness or frontal sinus tenderness.     Left Sinus: No maxillary sinus tenderness or frontal sinus tenderness.     Mouth/Throat:     Lips: Pink.     Mouth: Mucous membranes are moist.     Pharynx: Uvula midline. No oropharyngeal exudate or posterior oropharyngeal erythema.     Tonsils: No tonsillar exudate.  Eyes:     Conjunctiva/sclera: Conjunctivae normal.     Pupils: Pupils are equal, round, and reactive to light.  Cardiovascular:     Rate and Rhythm: Normal rate and regular rhythm.     Heart sounds: S1 normal and S2 normal. No murmur heard. Pulmonary:     Effort: Pulmonary effort is normal. No respiratory distress.     Breath sounds: Normal breath sounds. No decreased breath sounds, wheezing, rhonchi or rales.  Chest:     Chest wall: Tenderness present. No mass, lacerations, deformity, swelling, crepitus or edema. There is no dullness to percussion.    Abdominal:     General: Bowel sounds are normal.     Palpations:  Abdomen is soft.     Tenderness: There is no abdominal tenderness.  Musculoskeletal:        General: No swelling.     Cervical back: Neck supple.  Lymphadenopathy:     Head:     Right side of head: No submental, submandibular, tonsillar, preauricular or posterior auricular adenopathy.     Left side of head: No submental, submandibular, tonsillar, preauricular or posterior auricular adenopathy.     Cervical: No cervical adenopathy.     Right cervical: No superficial cervical adenopathy.    Left cervical: No superficial cervical  adenopathy.  Skin:    General: Skin is warm and dry.     Capillary Refill: Capillary refill takes less than 2 seconds.     Findings: No rash.  Neurological:     Mental Status: He is alert and oriented to person, place, and time.  Psychiatric:        Mood and Affect: Mood normal.      UC Treatments / Results  Labs (all labs ordered are listed, but only abnormal results are displayed) Comprehensive Metabolic Panel: 02/15/24: Order: [id number] Component Ref Range & Units 9 d ago  Sodium 136 - 145 mmol/L 137  Potassium 3.4 - 4.5 mmol/L 4.1  Chloride 98 - 107 mmol/L 105  CO2 21 - 31 mmol/L 25  Comment: High lactate dehydrogenase (LDH) concentrations in patient samples may cause falsely increased bicarbonate results. If markedly elevated LDH levels are suspected, please assess results in conjunction with patient's LDH values.  Anion Gap 6 - 14 mmol/L 7  Glucose, Random 70 - 99 mg/dL 895 High   Blood Urea  Nitrogen (BUN) 7 - 25 mg/dL 24  Creatinine 9.29 - 8.69 mg/dL 8.79  eGFR >40 fO/fpw/8.26f7 59 Low   Comment: GFR estimated by CKD-EPI equations(NKF 2021).  Recommend confirmation of Cr-based eGFR by using Cys-based eGFR and other filtration markers (if applicable) in complex cases and clinical decision-making, as needed.  Albumin  3.5 - 5.7 g/dL 4.3  Total Protein 6.4 - 8.9 g/dL 6.5  Bilirubin, Total 0.3 - 1.0 mg/dL 0.6  Alkaline Phosphatase (ALP) 34 - 104 U/L 67  Aspartate Aminotransferase (AST) 13 - 39 U/L 18  Alanine Aminotransferase (ALT) 7 - 52 U/L 16  Calcium  8.6 - 10.3 mg/dL 9.3  BUN/Creatinine Ratio   Comment: Creatinine is normal, ratio is not clinically indicated.  Resulting Agency [facility]    EKG   Radiology No results found.  Procedures Procedures (including critical care time)  Medications Ordered in UC Medications - No data to display  Initial Impression / Assessment and Plan / UC Course  I have reviewed  the triage vital signs and the nursing notes.  Pertinent labs & imaging results that were available during my care of the patient were reviewed by me and considered in my medical decision making (see chart for details).  Plan of Care (see discharge instructions for additional patient precautions and education): Rib pain after fall: Patient is currently on prednisone  20 mg daily until 03/07/2024.  This is a 21-day course of daily prednisone .  Additional NSAIDs not advised (see below).  X-rays appear negative for fractures.  I will contact the patient if the radiology review differs.  Encouraged good deep breaths to prevent pneumonia.  Avoid use of a rib belt to prevent pneumonia.  Do not use Advil, Motrin, Ibuprofen, Aleve, Naproxen Sodium (NSAIDS) for 1 to 2 weeks during/after using oral steroids or after a steroid injection.  May use Tylenol /Acetaminophen , 500 mg,1-1.5 pills, every 6 hours as needed for pain.   Intractable hiccups: Patient has had intermittent intractable hiccups many times in his life.  He has had some GI and neuro workup for this.  Encouraged him to take some laxatives and try to get empty of stool as sometimes constipation can contribute to her worsen hiccups.  Abrasion on face after fall: Clean the area with warm soapy fingers, rinse, pat dry, apply mupirocin  ointment.  If there is any redness, discharge or worsening pain, return here or go to primary care as these could be signs of infection.  Follow-up if symptoms do not improve, worsen or new symptoms occur.  I reviewed the plan of care with the patient and/or the patient's guardian.  The patient and/or guardian had time to ask questions and acknowledged that the questions were answered.  Final Clinical Impressions(s) / UC Diagnoses   Final diagnoses:  Rib pain on left side  Intractable hiccups  Fall on same level from slipping, tripping or stumbling, initial encounter  Abrasion, face w/o infection     Discharge  Instructions      Rib pain after fall: Patient is currently on prednisone  20 mg daily until 03/07/2024.  This is a 21-day course of daily prednisone .  Additional NSAIDs not advised (see below).  X-rays appear negative for fractures.  I will contact the patient if the radiology review differs.  Encouraged good deep breaths to prevent pneumonia.  Avoid use of a rib belt to prevent pneumonia.  Do not use Advil, Motrin, Ibuprofen, Aleve, Naproxen Sodium (NSAIDS) for 1 to 2 weeks during/after using oral steroids or after a steroid injection.  May use Tylenol /Acetaminophen , 500 mg,1-1.5 pills, every 6 hours as needed for pain.   Intractable hiccups: Patient has had intermittent intractable hiccups many times in his life.  He has had some GI and neuro workup for this.  Encouraged him to take some laxatives and try to get empty of stool as sometimes constipation can contribute to her worsen hiccups.  Abrasion on face after fall: Clean the area with warm soapy fingers, rinse, pat dry, apply mupirocin  ointment.  If there is any redness, discharge or worsening pain, return here or go to primary care as these could be signs of infection.  Follow-up if symptoms do not improve, worsen or new symptoms occur.     ED Prescriptions     Medication Sig Dispense Auth. Provider   mupirocin  ointment (BACTROBAN ) 2 % Apply 1 Application topically 2 (two) times daily. 22 g Ival Domino, FNP      PDMP not reviewed this encounter.    [1]  Social History Tobacco Use   Smoking status: Former    Current packs/day: 0.00    Average packs/day: 0.5 packs/day for 35.0 years (17.5 ttl pk-yrs)    Types: Cigarettes    Start date: 02/10/1958    Quit date: 02/10/1993    Years since quitting: 31.0   Smokeless tobacco: Former    Types: Chew    Quit date: 02/10/2006  Vaping Use   Vaping status: Never Used  Substance Use Topics   Alcohol  use: Not Currently    Alcohol /week: 1.0 standard drink of alcohol     Types: 1 Cans of  beer per week    Comment: occ   Drug use: No     Ival Domino, FNP 02/24/24 1200  "

## 2024-02-24 NOTE — Telephone Encounter (Signed)
 Left message on My Chart with lab results per Dr. Karry note. Routed to PCP

## 2024-02-24 NOTE — Progress Notes (Signed)
 Chest x-ray is negative.  No rib fractures no acute cardiopulmonary disease.  Patient updated via voicemail message.

## 2024-02-25 ENCOUNTER — Telehealth: Payer: Self-pay

## 2024-02-25 NOTE — Telephone Encounter (Signed)
 Pt viewed lab results on My Chart per Dr. Karry note. Routed to PCP.

## 2024-04-12 ENCOUNTER — Ambulatory Visit

## 2024-05-09 ENCOUNTER — Ambulatory Visit: Admitting: Family Medicine
